# Patient Record
Sex: Female | Born: 1945 | Race: Black or African American | Hispanic: No | Smoking: Former smoker
Health system: Southern US, Community
[De-identification: ages and names within clinical notes are randomized; demographics above are authoritative.]

## PROBLEM LIST (undated history)

## (undated) DIAGNOSIS — F17200 Nicotine dependence, unspecified, uncomplicated: Secondary | ICD-10-CM

## (undated) DIAGNOSIS — R001 Bradycardia, unspecified: Secondary | ICD-10-CM

## (undated) DIAGNOSIS — I2510 Atherosclerotic heart disease of native coronary artery without angina pectoris: Secondary | ICD-10-CM

## (undated) DIAGNOSIS — I6529 Occlusion and stenosis of unspecified carotid artery: Secondary | ICD-10-CM

## (undated) DIAGNOSIS — D649 Anemia, unspecified: Secondary | ICD-10-CM

## (undated) DIAGNOSIS — I4891 Unspecified atrial fibrillation: Secondary | ICD-10-CM

## (undated) DIAGNOSIS — N186 End stage renal disease: Secondary | ICD-10-CM

## (undated) DIAGNOSIS — N2581 Secondary hyperparathyroidism of renal origin: Secondary | ICD-10-CM

## (undated) DIAGNOSIS — I5032 Chronic diastolic (congestive) heart failure: Secondary | ICD-10-CM

## (undated) DIAGNOSIS — I10 Essential (primary) hypertension: Secondary | ICD-10-CM

## (undated) DIAGNOSIS — Z8673 Personal history of transient ischemic attack (TIA), and cerebral infarction without residual deficits: Secondary | ICD-10-CM

## (undated) DIAGNOSIS — I272 Pulmonary hypertension, unspecified: Secondary | ICD-10-CM

## (undated) HISTORY — DX: Pulmonary hypertension, unspecified: I27.20

## (undated) HISTORY — DX: Personal history of transient ischemic attack (TIA), and cerebral infarction without residual deficits: Z86.73

## (undated) HISTORY — DX: Atherosclerotic heart disease of native coronary artery without angina pectoris: I25.10

## (undated) HISTORY — DX: Chronic diastolic (congestive) heart failure: I50.32

## (undated) HISTORY — DX: Essential (primary) hypertension: I10

## (undated) HISTORY — DX: Bradycardia, unspecified: R00.1

## (undated) HISTORY — DX: Unspecified atrial fibrillation: I48.91

## (undated) HISTORY — DX: Occlusion and stenosis of unspecified carotid artery: I65.29

---

## 2003-09-20 ENCOUNTER — Other Ambulatory Visit: Payer: Self-pay

## 2008-03-18 ENCOUNTER — Emergency Department: Payer: Self-pay | Admitting: Unknown Physician Specialty

## 2009-02-02 ENCOUNTER — Inpatient Hospital Stay: Payer: Self-pay | Admitting: Specialist

## 2009-04-24 ENCOUNTER — Other Ambulatory Visit: Payer: Self-pay | Admitting: Nephrology

## 2009-09-02 ENCOUNTER — Inpatient Hospital Stay: Payer: Self-pay | Admitting: Internal Medicine

## 2009-09-30 ENCOUNTER — Ambulatory Visit: Payer: Self-pay | Admitting: Cardiovascular Disease

## 2009-09-30 ENCOUNTER — Inpatient Hospital Stay: Payer: Self-pay | Admitting: Specialist

## 2011-08-22 ENCOUNTER — Inpatient Hospital Stay: Payer: Self-pay | Admitting: Internal Medicine

## 2011-08-22 LAB — CBC
HCT: 19.7 % — ABNORMAL LOW (ref 35.0–47.0)
HGB: 6.3 g/dL — ABNORMAL LOW (ref 12.0–16.0)
MCH: 28.4 pg (ref 26.0–34.0)
MCHC: 31.8 g/dL — ABNORMAL LOW (ref 32.0–36.0)
Platelet: 186 10*3/uL (ref 150–440)
RBC: 2.21 10*6/uL — ABNORMAL LOW (ref 3.80–5.20)
WBC: 5.6 10*3/uL (ref 3.6–11.0)

## 2011-08-22 LAB — TROPONIN I
Troponin-I: 0.11 ng/mL — ABNORMAL HIGH
Troponin-I: 0.11 ng/mL — ABNORMAL HIGH

## 2011-08-22 LAB — COMPREHENSIVE METABOLIC PANEL
Albumin: 2.6 g/dL — ABNORMAL LOW (ref 3.4–5.0)
Anion Gap: 11 (ref 7–16)
Calcium, Total: 8.2 mg/dL — ABNORMAL LOW (ref 8.5–10.1)
Chloride: 115 mmol/L — ABNORMAL HIGH (ref 98–107)
Creatinine: 6.27 mg/dL — ABNORMAL HIGH (ref 0.60–1.30)
Glucose: 90 mg/dL (ref 65–99)
SGPT (ALT): 20 U/L
Sodium: 141 mmol/L (ref 136–145)

## 2011-08-22 LAB — CK TOTAL AND CKMB (NOT AT ARMC)
CK, Total: 98 U/L (ref 21–215)
CK-MB: 5.8 ng/mL — ABNORMAL HIGH (ref 0.5–3.6)
CK-MB: 6.3 ng/mL — ABNORMAL HIGH (ref 0.5–3.6)

## 2011-08-23 LAB — BASIC METABOLIC PANEL
Anion Gap: 14 (ref 7–16)
BUN: 71 mg/dL — ABNORMAL HIGH (ref 7–18)
Co2: 13 mmol/L — ABNORMAL LOW (ref 21–32)
EGFR (African American): 9 — ABNORMAL LOW
EGFR (Non-African Amer.): 7 — ABNORMAL LOW
Glucose: 73 mg/dL (ref 65–99)
Osmolality: 306 (ref 275–301)
Potassium: 4.6 mmol/L (ref 3.5–5.1)

## 2011-08-23 LAB — CBC WITH DIFFERENTIAL/PLATELET
Basophil #: 0 10*3/uL (ref 0.0–0.1)
Basophil %: 0.6 %
Eosinophil %: 2.5 %
HCT: 24.5 % — ABNORMAL LOW (ref 35.0–47.0)
Lymphocyte #: 0.7 10*3/uL — ABNORMAL LOW (ref 1.0–3.6)
MCH: 29 pg (ref 26.0–34.0)
MCHC: 32.5 g/dL (ref 32.0–36.0)
Monocyte #: 0.2 10*3/uL (ref 0.0–0.7)
Neutrophil #: 3.7 10*3/uL (ref 1.4–6.5)
Neutrophil %: 77.4 %
Platelet: 165 10*3/uL (ref 150–440)
RBC: 2.75 10*6/uL — ABNORMAL LOW (ref 3.80–5.20)
WBC: 4.7 10*3/uL (ref 3.6–11.0)

## 2011-08-23 LAB — LIPID PANEL: VLDL Cholesterol, Calc: 11 mg/dL (ref 5–40)

## 2011-08-23 LAB — CK TOTAL AND CKMB (NOT AT ARMC): CK-MB: 5.3 ng/mL — ABNORMAL HIGH (ref 0.5–3.6)

## 2011-08-23 LAB — TROPONIN I: Troponin-I: 0.08 ng/mL — ABNORMAL HIGH

## 2011-08-23 LAB — MAGNESIUM: Magnesium: 1.7 mg/dL — ABNORMAL LOW

## 2011-08-23 LAB — IRON AND TIBC
Iron Bind.Cap.(Total): 285 ug/dL (ref 250–450)
Iron Saturation: 27 %

## 2011-08-23 LAB — PHOSPHORUS: Phosphorus: 6.1 mg/dL — ABNORMAL HIGH (ref 2.5–4.9)

## 2011-08-23 LAB — FERRITIN: Ferritin (ARMC): 14 ng/mL (ref 8–388)

## 2011-08-24 LAB — PROTEIN / CREATININE RATIO, URINE
Creatinine, Urine: 44 mg/dL (ref 30.0–125.0)
Protein, Random Urine: 363 mg/dL — ABNORMAL HIGH (ref 0–12)

## 2011-08-24 LAB — HEMOGLOBIN: HGB: 8.6 g/dL — ABNORMAL LOW (ref 12.0–16.0)

## 2011-08-25 LAB — CBC WITH DIFFERENTIAL/PLATELET
Basophil #: 0 10*3/uL (ref 0.0–0.1)
Eosinophil #: 0.1 10*3/uL (ref 0.0–0.7)
Eosinophil %: 0.7 %
HCT: 27.5 % — ABNORMAL LOW (ref 35.0–47.0)
Lymphocyte %: 8.1 %
MCH: 29.6 pg (ref 26.0–34.0)
MCHC: 32.8 g/dL (ref 32.0–36.0)
MCV: 90 fL (ref 80–100)
Neutrophil #: 6.2 10*3/uL (ref 1.4–6.5)
Neutrophil %: 85.6 %
Platelet: 140 10*3/uL — ABNORMAL LOW (ref 150–440)
RBC: 3.05 10*6/uL — ABNORMAL LOW (ref 3.80–5.20)
RDW: 18.4 % — ABNORMAL HIGH (ref 11.5–14.5)

## 2011-08-25 LAB — BASIC METABOLIC PANEL
Anion Gap: 9 (ref 7–16)
Calcium, Total: 8.6 mg/dL (ref 8.5–10.1)
Chloride: 105 mmol/L (ref 98–107)
Creatinine: 4.16 mg/dL — ABNORMAL HIGH (ref 0.60–1.30)
EGFR (Non-African Amer.): 11 — ABNORMAL LOW
Osmolality: 287 (ref 275–301)

## 2011-08-26 LAB — CBC WITH DIFFERENTIAL/PLATELET
Basophil %: 0.4 %
Eosinophil #: 0.1 10*3/uL (ref 0.0–0.7)
HCT: 28.2 % — ABNORMAL LOW (ref 35.0–47.0)
HGB: 9 g/dL — ABNORMAL LOW (ref 12.0–16.0)
Lymphocyte #: 0.6 10*3/uL — ABNORMAL LOW (ref 1.0–3.6)
Lymphocyte %: 7.6 %
MCH: 28.8 pg (ref 26.0–34.0)
MCHC: 32 g/dL (ref 32.0–36.0)
Monocyte %: 6.5 %
RBC: 3.14 10*6/uL — ABNORMAL LOW (ref 3.80–5.20)

## 2011-08-26 LAB — BASIC METABOLIC PANEL
Anion Gap: 8 (ref 7–16)
Calcium, Total: 8.6 mg/dL (ref 8.5–10.1)
Co2: 29 mmol/L (ref 21–32)
Creatinine: 2.95 mg/dL — ABNORMAL HIGH (ref 0.60–1.30)
EGFR (African American): 19 — ABNORMAL LOW
EGFR (Non-African Amer.): 16 — ABNORMAL LOW
Glucose: 91 mg/dL (ref 65–99)
Osmolality: 276 (ref 275–301)
Potassium: 3.8 mmol/L (ref 3.5–5.1)
Sodium: 138 mmol/L (ref 136–145)

## 2011-09-19 ENCOUNTER — Ambulatory Visit: Payer: Self-pay | Admitting: Vascular Surgery

## 2011-09-19 LAB — CBC
HCT: 37.5 % (ref 35.0–47.0)
HGB: 11.9 g/dL — ABNORMAL LOW (ref 12.0–16.0)
MCH: 30.2 pg (ref 26.0–34.0)
MCHC: 31.7 g/dL — ABNORMAL LOW (ref 32.0–36.0)
Platelet: 193 10*3/uL (ref 150–440)
RBC: 3.94 10*6/uL (ref 3.80–5.20)
RDW: 19.5 % — ABNORMAL HIGH (ref 11.5–14.5)

## 2011-09-19 LAB — BASIC METABOLIC PANEL
Anion Gap: 9 (ref 7–16)
Calcium, Total: 9 mg/dL (ref 8.5–10.1)
Chloride: 107 mmol/L (ref 98–107)
Co2: 22 mmol/L (ref 21–32)
Creatinine: 5.4 mg/dL — ABNORMAL HIGH (ref 0.60–1.30)
EGFR (African American): 9 — ABNORMAL LOW
Osmolality: 283 (ref 275–301)
Potassium: 4.2 mmol/L (ref 3.5–5.1)
Sodium: 138 mmol/L (ref 136–145)

## 2011-09-30 ENCOUNTER — Ambulatory Visit: Payer: Self-pay | Admitting: Vascular Surgery

## 2011-09-30 LAB — POTASSIUM: Potassium: 4.2 mmol/L (ref 3.5–5.1)

## 2011-12-12 ENCOUNTER — Ambulatory Visit: Payer: Self-pay | Admitting: Vascular Surgery

## 2012-02-13 ENCOUNTER — Ambulatory Visit: Payer: Self-pay | Admitting: Vascular Surgery

## 2012-11-15 ENCOUNTER — Emergency Department: Payer: Self-pay | Admitting: Emergency Medicine

## 2012-12-19 ENCOUNTER — Ambulatory Visit: Payer: Self-pay | Admitting: Vascular Surgery

## 2013-08-02 ENCOUNTER — Encounter: Payer: Self-pay | Admitting: Podiatry

## 2013-11-26 ENCOUNTER — Emergency Department: Payer: Self-pay | Admitting: Emergency Medicine

## 2013-11-26 LAB — BASIC METABOLIC PANEL
Anion Gap: 9 (ref 7–16)
BUN: 52 mg/dL — AB (ref 7–18)
Calcium, Total: 8.8 mg/dL (ref 8.5–10.1)
Chloride: 102 mmol/L (ref 98–107)
Co2: 26 mmol/L (ref 21–32)
Creatinine: 9.2 mg/dL — ABNORMAL HIGH (ref 0.60–1.30)
EGFR (Non-African Amer.): 4 — ABNORMAL LOW
GFR CALC AF AMER: 5 — AB
Glucose: 87 mg/dL (ref 65–99)
Osmolality: 287 (ref 275–301)
POTASSIUM: 5.2 mmol/L — AB (ref 3.5–5.1)
Sodium: 137 mmol/L (ref 136–145)

## 2013-11-26 LAB — CBC
HCT: 27.6 % — ABNORMAL LOW (ref 35.0–47.0)
HGB: 8.7 g/dL — ABNORMAL LOW (ref 12.0–16.0)
MCH: 30.2 pg (ref 26.0–34.0)
MCHC: 31.5 g/dL — AB (ref 32.0–36.0)
MCV: 96 fL (ref 80–100)
PLATELETS: 147 10*3/uL — AB (ref 150–440)
RBC: 2.87 10*6/uL — ABNORMAL LOW (ref 3.80–5.20)
RDW: 15.5 % — ABNORMAL HIGH (ref 11.5–14.5)
WBC: 4.8 10*3/uL (ref 3.6–11.0)

## 2013-12-30 ENCOUNTER — Ambulatory Visit: Payer: Self-pay | Admitting: Vascular Surgery

## 2014-08-27 ENCOUNTER — Ambulatory Visit: Payer: Self-pay | Admitting: Podiatry

## 2014-09-02 NOTE — Op Note (Signed)
PATIENT NAME:  Toni Carlson, PANCOAST MR#:  [id number] DATE OF BIRTH:  27-Aug-1945  DATE OF PROCEDURE:  02/13/2012  PREOPERATIVE DIAGNOSES:  1. End-stage renal disease.  2. Poorly functioning left arm AV fistula.  3. Hypertension.   POSTOPERATIVE DIAGNOSES:  1. End-stage renal disease.  2. Poorly functioning left arm AV fistula.  3. Hypertension.   PROCEDURES PERFORMED: 1. Ultrasound guidance for vascular access to left brachiocephalic AV fistula.  2. Left upper extremity AV fistulogram.  3. Percutaneous transluminal angioplasty of stenosis at the cephalic vein/subclavian vein confluence with 7 and 8 mm diameter angioplasty balloons.   SURGEON: Algernon Huxley, M.D.   ANESTHESIA: Local with moderate conscious sedation.   ESTIMATED BLOOD LOSS: Minimal.   CONTRAST USED: 45 mL Visipaque.   INDICATION FOR PROCEDURE: This is a 69 year old African American female with end-stage renal disease. Her fistula has had decrease in flows on dialysis and we are asked to assess this with fistulogram. The risks and benefits were discussed and informed consent was obtained.   DESCRIPTION OF PROCEDURE: The patient was brought to the vascular interventional radiology suite. The left upper extremity was sterilely prepped and draped and a sterile surgical field was created. The fistula was accessed not far beyond the anastomosis with a micropuncture needle. Micropuncture wire and sheath were placed. 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 The fistula around the pseudoaneurysm appeared patent. Further up the arm there was a moderate stenosis at the cephalic vein/subclavian vein confluence and I elected to treat this area. The stenosis appeared to be in the 60% range. I crossed the lesion without difficulty with a Magic torque wire. A 6 French sheath had been placed. 7 mm and then an 8 mm diameter angioplasty balloon were performed. There appeared to be some vasospasm  following this, but the area did appear to have improved flow particularly in the area of the tightest stenosis prior to treatment. At this point, I elected to terminate the procedure. The sheath was removed around a 4-0 Monocryl pursestring suture, pressure was held, and a sterile dressing was placed. The patient tolerated the procedure well and was taken to the recovery room in stable condition. ____________________________ Algernon Huxley, MD jsd:slb D: 02/13/2012 14:27:21 ET T: 02/13/2012 14:47:11 ET JOB#: [id number]  cc: Algernon Huxley, MD, <Dictator> Algernon Huxley MD ELECTRONICALLY SIGNED 02/14/2012 13:59

## 2014-09-05 NOTE — Op Note (Signed)
PATIENT NAME:  Toni Carlson, Toni Carlson MR#:  [id number] DATE OF BIRTH:  09/15/1945  DATE OF PROCEDURE:  12/19/2012  PREOPERATIVE DIAGNOSES:  1. End-stage renal disease.  2. Poorly functioning left arm arteriovenous fistula.  3. Hypertension.  4. Congestive heart failure.  5. Coronary disease.   POSTOPERATIVE DIAGNOSES: 1. End-stage renal disease.  2. Poorly functioning left arm arteriovenous fistula.  3. Hypertension.  4. Congestive heart failure.  5. Coronary disease.   PROCEDURES: 1. Ultrasound guidance for vascular access to left arm arteriovenous fistula.  2. Left upper extremity fistulogram, central venogram.  3. Percutaneous transluminal angioplasty of peri-anastomotic stenosis with a 5 mm diameter angioplasty balloon.  4. Percutaneous transluminal angioplasty of stenosis in the cephalic vein near subclavian vein confluence with 7 mm diameter angioplasty balloon.   SURGEON: Algernon Huxley, MD  ANESTHESIA: Local with moderate conscious sedation.   ESTIMATED BLOOD LOSS: Approximately 25 mL.   CONTRAST USED: 30 mL Visipaque.   FLUOROSCOPY TIME: About 2-1/2 minutes.   INDICATION FOR PROCEDURE: This is a 69 year old African-American female with end-stage renal disease. She has had diminished flows on her AV fistula for several weeks that are worsening. A noninvasive study about 3 months ago showed some areas of narrowing, but at that time, her fistula was working well. There was concern that this would likely progress, and we were asked to do a fistulogram. Risks and benefits were discussed. Informed consent was obtained.   DESCRIPTION OF THE PROCEDURE: The patient was brought to the vascular interventional radiology suite. The left upper extremity was sterilely prepped and draped, and a sterile surgical field was created. The fistula was accessed in the midportion under direct ultrasound guidance without difficulty with a micropuncture needle, and micropuncture wire and sheath were placed. It  was initially in retrograde fashion, and a Kumpe catheter was placed through the arterial anastomosis which showed a 70% to 80% stenosis at the peri-anastomotic region in the swing point of the cephalic vein. Magic Torque wire was placed into the brachial artery, and this lesion was treated with a 5 mm diameter angioplasty balloon. Following angioplasty, there was still some stenosis that appeared to likely be in the 40% to 50% range, but there was improvement and a good thrill in the fistula, so I elected to not over-dilate this area for fear of injuring it at the peri-anastomotic region. I then flipped the sheath under fluoroscopic guidance with a Magic Torque wire, and took this in an antegrade fashion. Stenosis was seen in the cephalic vein about 3 cm before the confluence of the subclavian vein that was high-grade, in the 85% to 90% range. The remainder of the cephalic vein and central venous circulation were patent. This lesion was crossed without difficulty with a Magic Torque wire. It was treated with a 7 mm diameter high-pressure angioplasty balloon inflated to 18 atmospheres. Following angioplasty, this area was markedly improved, with only about 15% to 20% residual stenosis, and I elected to terminate the procedure. The sheath was removed around a 4-0 Monocryl pursestring suture. Pressure was held. Sterile dressing was placed. The patient tolerated the procedure well and was taken to the recovery room in stable condition.   ____________________________ Algernon Huxley, MD jsd:OSi D: 12/19/2012 11:15:23 ET T: 12/19/2012 11:41:04 ET JOB#: [id number]  cc: Algernon Huxley, MD, <Dictator> Algernon Huxley MD ELECTRONICALLY SIGNED 12/20/2012 8:18

## 2014-09-06 NOTE — Op Note (Signed)
PATIENT NAME:  Toni Carlson, Toni Carlson MR#:  [id number] DATE OF BIRTH:  [birth year]  DATE OF PROCEDURE:  12/30/2013  PREOPERATIVE DIAGNOSES: 1.  End-stage renal disease.  2.  Poorly functioning left arm arteriovenous fistula with decreased flow and aneurysms.  3.  Hypertension.   POSTOPERATIVE DIAGNOSES:  1.  End-stage renal disease.  2.  Poorly functioning left arm arteriovenous fistula with decreased flow and aneurysms.  3.  Hypertension.   PROCEDURES PERFORMED: 1.  Ultrasound guidance for vascular access left brachiocephalic arteriovenous fistula.  2.  Left upper extremity fistulogram and central venogram.  3.  Percutaneous transluminal angioplasty of left cephalic vein and subclavian vein confluence with 8 and 10 mm diameter angioplasty balloon.  4.  Percutaneous transluminal angioplasty of left innominate vein with an 8 and 10 mm diameter angioplasty balloon.   SURGEON: Algernon Huxley, M.D.   ANESTHESIA: Local with moderate conscious sedation.   ESTIMATED BLOOD LOSS: 25 mL.   INDICATION FOR PROCEDURE: This is a 69 year old female with end-stage renal disease. Her dialysis access has had diminished function. It also has aneurysmal portions and noninvasive studies have shown significant areas of stenosis in the cephalic vein near the shoulder, and she is brought in for further evaluation. Risks and benefits were discussed. Informed consent was obtained.   DESCRIPTION OF PROCEDURE: The patient was brought to the vascular suite. The left upper extremity was sterilely prepped and draped and a sterile surgical field was created. The fistula was accessed just beyond the anastomosis with a micropuncture needle under direct ultrasound guidance. A micropuncture wire and sheath were then placed and upsized to a 6-French sheath. The patient was given 3000 units of intravenous heparin. Imaging was then performed. This showed aneurysmal areas at the access sites with relative smaller vein between access sites,  but this was not really stenotic and the caliber of the vein at these locations was still in the 8 to 9 mm range. With compression of the fistula, imaging was performed in the perianastomotic and brachial artery. It was smaller in this location, but was still 4 to 5 mm and did not appear flow limiting. The cephalic vein near the subclavian vein confluence had a high-grade vein valve stenosis that was in the 75 to 80% range. There was also slow flow through the left innominate vein with collaterals in the jugular venous system that filled more briskly. This was a little difficult to discern on initial imaging, but later turned out to be significant stenosis of the left innominate vein. A Magic torque wire was used to cross both lesions without difficulty. An 8 mm balloon was initially taken for the cephalic vein and subclavian vein confluence. A tight waste was taken and this was taken to burst pressure and the waste did not resolve. I then advanced into the left innominate vein and used the angioplasty balloon to see if there was actually narrowing in this location and there was. There was a significant waste that was seen in the left innominate vein. The 8 mm balloon was undersized for the left innominate vein and I upsized to a 10 mm diameter angioplasty balloon, inflated to 10 atmospheres with good angiographic completion result and no significant residual stenosis. I inflated the 10 mm diameter angioplasty balloon at the cephalic vein and subclavian vein confluence as well and completion angiogram performed following this had about a 20 to 25% residual stenosis, which was not flow limiting. At this point, I elected to terminate the procedure. The sheath was  removed and a 4-0 Monocryl pursestring suture was placed. Pressure was held. Sterile dressing was placed. The patient tolerated the procedure well and was taken to the recovery room in stable condition.  ____________________________ Algernon Huxley,  MD jsd:sb D: 12/30/2013 14:10:03 ET T: 12/30/2013 16:57:26 ET JOB#: [id number]  cc: Algernon Huxley, MD, <Dictator> Algernon Huxley MD ELECTRONICALLY SIGNED 01/08/2014 10:32

## 2014-09-07 NOTE — H&P (Signed)
PATIENT NAME:  Toni Carlson, Toni Carlson MR#:  [id number] DATE OF BIRTH:  1945/12/21  DATE OF ADMISSION:  08/22/2011  REFERRING PHYSICIAN: ER physician, Dr. Jasmine December   PRIMARY CARE PHYSICIAN: None. The patient used to follow-up at [facility]: Leg swelling.   HISTORY OF PRESENT ILLNESS: The patient is a 69 year old African American female with past medical history of hypertension, chronic kidney disease stage IV in the past, anemia secondary to chronic kidney disease and iron deficiency, as well as diastolic dysfunction who was last admitted to [facility] in 09/2009 for anemia secondary to chronic kidney disease complicated by iron deficiency anemia. For the past one year the patient has not followed up with any physician. She has stopped taking all her medications. She was seen by Dr. Holley Raring in the past but has not followed up with any nephrologist either. She reports sudden onset of bilateral leg swelling in the last two weeks. Denies any shortness of breath or chest pain. She reports fatigue on prolonged standing. Denies any oliguria, nausea, vomiting, diarrhea, constipation, headache, or change in vision. She was found to have acute on chronic kidney failure, hypertensive emergency, evidence of diastolic congestive heart failure, acidosis, and severe anemia.   PAST MEDICAL HISTORY:  1. Hypertension. 2. 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  3. 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 The patient had an endoscopy and colonoscopy in 2010. The endoscopy showed gastritis, esophagitis, and normal duodenum. The colonoscopy showed diverticulosis. 4. Diastolic dysfunction. The patient's last echo in [id number] showed diastolic dysfunction with moderate LVH and normal systolic function.   PAST SURGICAL HISTORY: None.  ALLERGIES: None.    MEDICATIONS: The patient is not taking any medications.   SOCIAL HISTORY: The  patient smokes 1 pack per day for the past five years. Denies any alcohol or drug abuse. She lives with her daughter. 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   FAMILY HISTORY: Sister had lupus. Mother with Alzheimer's dementia. Father died at the age of 84 of MI.   REVIEW OF SYSTEMS: CONSTITUTIONAL: Patient reports fatigue. Denies any fever. EYES: Denies any blurred or double vision. ENT: Denies any tinnitus or ear pain. RESPIRATORY: Denies any cough or wheezing. CARDIOVASCULAR: Denies any chest pain or palpitations. GI: Denies any nausea or vomiting. GU: Denies any dysuria, hematuria, or oliguria. ENDOCRINE: Denies any polyuria, nocturia, or thyroid problems. HEME/LYMPH: Has anemia. Denies any easy bruisability. INTEGUMENTARY: Denies any acne or rash. MUSCULOSKELETAL: Denies any swelling or gout. NEUROLOGIC: Denies any numbness or weakness. PSYCH: Denies any anxiety or depression.   PHYSICAL EXAMINATION:   VITAL SIGNS: Temperature 98.6, heart rate 62, respiratory rate 18, blood pressure 235/95, pulse oximetry 100%.   GENERAL: The patient is a 69 year old African American female sitting in bed.   HEAD: Atraumatic, normocephalic.   EYES: There is severe pallor. No icterus or cyanosis. Pupils equal, round, and reactive to light and accommodation. Extraocular movements intact.   ENT: Wet mucous membranes. No oropharyngeal erythema or thrush.   NECK: Supple. No masses. No JVD. No thyromegaly.  CHEST WALL: No tenderness to palpation. Not using accessory muscles of respiration or intercostal retraction.   LUNGS: The patient has bilateral basal crepitations. No wheezing or rhonchi.   CARDIOVASCULAR: S1, S2 regular. There is a systolic murmur. No rubs or gallops.   ABDOMEN: Soft, nontender, nondistended. No guarding or rigidity. No organomegaly.   SKIN: No rashes or  lesions.   PERIPHERIES: There is 2+ pitting edema all the way up to the knees. The patient also has calf tenderness. 1+ pedal pulses.    MUSCULOSKELETAL: No cyanosis or clubbing.   NEUROLOGIC: Awake, alert, oriented x3. Nonfocal neurological exam. Cranial nerves grossly intact.    PSYCH: Normal mood and affect.   LABORATORY, DIAGNOSTIC, AND RADIOLOGICAL DATA: Chest x-ray shows CHF superimposed on COPD.   Glucose 90, BUN 72, creatinine 6.27, sodium 141, potassium 4.7, chloride 115, CO2 15, calcium 8.2, alkaline phosphatase 222, white count 5.6, hemoglobin 6.3, hematocrit 19.7, platelet 196. Troponin 0.11.   ASSESSMENT AND PLAN: This is a 69 year old female who has been noncompliant with medications and follow-up with history of hypertension, anemia, chronic kidney disease, and diastolic dysfunction who presents with bilateral leg swelling.  1. Hypertensive emergency. The patient's blood pressure was 235/95 on admission. In the past she was on hydralazine, Imdur, and Norvasc. She was on clonidine but that was discontinued due to bradycardia. Will restart her on hydralazine, Imdur, and Norvasc and place on nitro paste. Will also place on p.r.n. clonidine and hydralazine to help better control her blood pressure. 2. Acute on chronic disease. In 2011 the patient's creatinine was 4.17 to 3.86. There has been progression of the renal failure in addition to acidosis. Her electrolytes are normal at present. Discussed with nephrologist, Dr. Candiss Norse. He will evaluate the patient. He recommends giving the patient high dose Lasix 100 mg x1 dose now and then continue high dose Lasix at 80 mg b.i.d.   3. Diastolic CHF exacerbation. The patient has lower extremity swelling, evidence of rales on chest exam, elevated BNP. Will give her Lasix 100 mg x1 dose now and then continue high dose Lasix. This will help her with her leg swelling as well as better control of her blood pressure. 4. Elevated alkaline phosphatase, possibly related to her acute problems including acute renal failure.  5. Anemia. The patient has severe anemia and pallor. In the past,  her hemoglobin has been as low as 5.2. She had extensive work-up during her last hospitalization in 2011 and 2010. Iron studies at that time were consistent with anemia of chronic disease. She also had endoscopy and colonoscopy which showed no evidence of blood loss. Will transfuse her 2 units of blood. Recheck a CBC in the a.m. Will repeat iron studies. Will check stool guaiacs. The patient may require initiation of oral iron supplementation and Procrit in view of her chronic kidney disease.  6. Elevated troponin. The patient denies any chest pain. The troponins could be elevated due to her accelerated hypertension, CHF, and chronic kidney disease. She has been evaluated by cardiologist in the past and will recommend conservative management. Her last echo had shown diastolic dysfunction. Will repeat an echo. Check serial cardiac enzymes. Cannot give her any anticoagulants in light of her severe anemia. Would also hold off any rate lowering agents in view of bradycardia.  7. Smoking. The patient has been counseled about cessation for more than three minutes. Will provide with a nicotine patch in the hospital.  Discussed with the ED physician, discussed with Dr. Candiss Norse, reviewed all medical records, discussed with the patient the plan of care and management.   CRITICAL CARE TIME SPENT: 45 minutes.   ____________________________ Cherre Huger, MD sp:drc D: 08/22/2011 15:45:55 ET T: 08/22/2011 16:23:27 ET JOB#: [id number]  cc: Cherre Huger, MD, <Dictator> Cherre Huger MD ELECTRONICALLY SIGNED 08/23/2011 13:14

## 2014-09-07 NOTE — Op Note (Signed)
PATIENT NAME:  Toni Carlson, CORNER MR#:  [id number] DATE OF BIRTH:  10-19-1945  DATE OF PROCEDURE:  09/30/2011  PREOPERATIVE DIAGNOSIS: End-stage renal disease requiring hemodialysis.   POSTOPERATIVE DIAGNOSIS: End-stage renal disease requiring hemodialysis.   PROCEDURE PERFORMED: Creation of a left arm brachiocephalic fistula.   SURGEON: Katha Cabal, MD    ANESTHESIA: General by LMA.   FLUIDS: Per anesthesia record.   ESTIMATED BLOOD LOSS: Minimal.   SPECIMEN: None.   INDICATIONS: [name] is a 69 year old woman who has been initiated on hemodialysis and is currently maintained with a right IJ tunneled catheter. She has undergone work-up and vein mapping which suggested that a vein was not available for fistula creation; and, therefore, she was prepared for a brachial axillary dialysis graft. Upon induction of general anesthesia with vasodilatation that ensues, the patient appears to have a rather nice basilic and cephalic vein noted in the antecubital fossa. The risks and benefits for a possible fistula creation have been reviewed with the family. It is their  stated desire to have a fistula, if possible; and, therefore, I will explore the vein prior to committing to a graft.   DESCRIPTION OF PROCEDURE: The patient is taken to the Operating Room and placed in the supine position. After adequate general anesthesia is induced, appropriate invasive monitors are placed. Her left arm is extended and prepped and draped in sterile fashion. The cephalic vein is visible in the antecubital fossa, and a curvilinear incision is created; and the dissection is carried down to expose the cephalic vein which appears to be a rather nice vein measuring at least 3.5 to 4 mm. It is dissected circumferentially and marked with a marker. The brachial artery is then exposed medially and looped proximally and distally with Silastic vessel loops.   The cephalic vein is then ligated approximately 1 cm below the  antecubital crease at a place where it bifurcates. Ties of 3-0 silk are used to ligate the vein. The vein is then transected with Potts scissors and spatulated through both branches. It is easily dilated with a Donna Christen coronary dilator which is 3.5 mm; and, in fact, with gentle pressure irrigating with heparinized saline the vein clearly is larger than 4 mm throughout its visualized portion.   Arteriotomy is then made. Stay sutures of 6-0 Prolene are placed, and the cephalic vein is anastomosed to the brachial artery in an end vein-to-side artery fashion using running 6-0 Prolene. Flushing maneuvers are performed, and flow is established through the fistula and then distally. Palpation of the cephalic vein along the arm demonstrates a soft but continuous thrill all the way to the level of the deltoid insertion, and the vein is actually dilated quite nicely and is visible already. There is absolutely no pulsatility to the fistula. The patient maintains a radial pulse.   The wound is then irrigated, inspected, and hemostasis is achieved with Bovie cautery. Subsequently, it is closed in layers using interrupted 3-0 Vicryl, followed by 4-0 Monocryl subcuticular for skin and then Dermabond. The patient tolerated the procedure well. There were no immediate complications. Sponge and needle counts were correct. She was taken to the recovery area in excellent condition.   ____________________________ Katha Cabal, MD ggs:cbb D: 09/30/2011 16:20:21 ET T: 09/30/2011 17:53:23 ET JOB#: [id number]  cc: Katha Cabal, MD, <Dictator> Katha Cabal MD ELECTRONICALLY SIGNED 10/03/2011 17:41

## 2014-09-07 NOTE — Consult Note (Signed)
PATIENT NAME:  Toni Carlson, Toni Carlson MR#:  [id number] DATE OF BIRTH:  1946/03/27  DATE OF CONSULTATION:  08/23/2011  REFERRING PHYSICIAN:   CONSULTING PHYSICIAN:  Dionisio David, MD  INDICATION FOR CONSULTATION: Bradycardia and chest pain.   HISTORY OF PRESENT ILLNESS: This is a 69 year old pleasant African American female who is followed at [facility], came in with uncontrolled hypertension, swelling of the legs, congestion, shortness of breath yesterday. She has been admitted in the past with severe anemia secondary to chronic kidney disease. She had significant renal failure and has been seeing Dr. Holley Raring, the nephrologist, and now Dr. Candiss Norse and she is on the verge of having dialysis. Consideration for dialysis was already being made because of stage IV kidney disease. This morning she developed severe 10/10 chest pain associated with shortness of breath, diaphoresis, hypotension, as well as bradycardia. She dropped her heart rate in the 30's. Right now her heart rate is 48 to 52, blood pressure 105/70, respirations 18. She still has chest pain but is 4/10; initially it was 10/10.   PAST MEDICAL HISTORY:  1. History of hypertension.  2. History of chronic kidney disease, stage IV. 3. Anemia. 4. Diastolic dysfunction.   ALLERGIES: None.   SOCIAL HISTORY: She denies EtOH abuse but smoking. She smokes a half pack per day.   FAMILY HISTORY: Mother has dementia and Alzheimer's. Father died of coronary artery disease at age 75, had MI.   PHYSICAL EXAMINATION:   VITAL SIGNS: She is afebrile. Pulse 42 to 52, respirations 18, blood pressure 105/50. Initially when she first came her blood pressure was 235/95. Pulse oximetry was 100%.   HEENT: Positive JVD.   LUNGS: Few crackles at the bases.   HEART: Regular rate and rhythm. Normal S1, S2. No audible murmur.   ABDOMEN: Soft, nontender. Positive bowel sounds.   EXTREMITIES: 1+ pedal edema.   DIAGNOSTIC DATA: EKG shows sinus bradycardia, 56  beats per minute, LVH, nonspecific ST-T changes. Her BNP 50,043. BUN 71, creatinine 6.07. Troponin is 0.11; third set is 0.08. Hemoglobin is 8; initially it was 6.3 after 2 units of transfusion.   ASSESSMENT AND PLAN:  1. Severe anemia.  2. Chest pain. 3. Stage IV kidney disease.   Discussed with Dr. Candiss Norse. They are going to do dialysis anyway, thus the patient will have a cardiac catheterization and temporary pacemaker wire implanted.  ____________________________ Dionisio David, MD sak:drc D: 08/23/2011 10:40:46 ET T: 08/23/2011 10:57:18 ET JOB#: [id number]  cc: Dionisio David, MD, <Dictator> Dionisio David MD ELECTRONICALLY SIGNED 09/01/2011 9:03

## 2014-09-07 NOTE — Discharge Summary (Signed)
PATIENT NAME:  Toni, Carlson MR#:  [id number] DATE OF BIRTH:  Dec 16, 1945  DATE OF ADMISSION:  08/22/2011 DATE OF DISCHARGE:  08/26/2011  PRIMARY CARE PHYSICIAN: [facility].   NEPHROLOGIST: Murlean Iba, MD  CARDIOLOGIST: Neoma Laming, MD  DISCHARGE DIAGNOSES:  1. Unstable angina, now resolved, status post cardiac catheterization showing moderate left circumflex and mid RCA disease. Chest pain thought to be secondary to fluid overload due to renal failure and hypertensive heart disease, per cardiology.  2. Sinus bradycardia. Could be due to renal failure associated with metabolic issues.  3. Hypertensive emergency, on hydralazine, Imdur, and losartan. Cannot use beta blocker due to bradycardia. Blood pressure under much better control.  4. Chronic kidney disease stage V requiring hemodialysis. Schedule is Monday, Wednesday, and Friday as an outpatient.  5. Acute on chronic diastolic heart failure, on Lasix and ARB. Cannot use beta blocker due to bradycardia.  6. Elevated alkaline phosphatase, likely due to liver condition. 7. Anemia of chronic disease, likely due to chronic kidney disease status post 3 packed red blood cell transfusions and stable hemodynamics now. She may require erythropoietin as an outpatient with dialysis. She was given intravenous iron while in the hospital.  8. Elevated troponin, likely due to supply/demand ischemia.   SECONDARY DIAGNOSES:  1. Hypertension.  2. Chronic kidney disease, stage IV.  3. Anemia of chronic disease.  4. Diastolic dysfunction.   CONSULTANTS: 1. Neoma Laming, MD - Cardiology. 2. Murlean Iba, MD - Nephrology. 3. Hortencia Pilar, MD - Vascular Surgery.  PROCEDURES/RADIOLOGY: Insertion of right IJ cuffed tunneled dialysis catheter with ultrasound guidance on 08/23/2011 by Dr. Delana Meyer.  Cardiac catheterization with temporary pacemaker placement without complication on 0000000 by Dr. Neoma Laming. Initially pacer was pacing at 70 per  minute. Cardiac catheterization revealed moderate left circumflex and mid RCA disease.   Readjustment of pacer lead and rate on 08/24/2011 by Dr. Humphrey Rolls with pacer rate of 80 per minute.   Chest x-ray on 08/22/2011 showed findings consistent with congestive heart failure superimposed upon chronic obstructive pulmonary disease. No pneumonia. No pleural effusion.   Bilateral lower extremity Doppler's on 08/22/2011 showed no deep vein thrombosis on either side.   2-D echocardiogram on 08/23/2011 showed normal chamber size and function with moderate left ventricular hypertrophy. Mild mitral regurgitation. Mild to moderate tricuspid regurgitation with moderate pulmonary hypertension. Ejection fraction of more than 55%.   MAJOR LABORATORY PANEL: HBsAg was negative on 08/23/2011. Intact PTH was elevated with a value of 1435. Serum vitamin D was low with a value of 10.1. Serum transferrin was within normal limits with a value of 230. HBsAg repeated on 08/26/2011 was negative.   HISTORY AND SHORT HOSPITAL COURSE: The patient is a 69 year old female with the above-mentioned medical problems who was admitted for worsening leg swelling. She was found to have acute on chronic kidney disease along with hypertensive emergency with initial blood pressure of 235/95 and possible diastolic heart failure. She was also found to be severely anemic with a hemoglobin of 6.3 and was transfused 2 units of packed red blood cells. Nephrology consult was obtained with Dr. Murlean Iba considering worsening chronic kidney disease. Please see dictated history and physical by Dr. Cherre Huger for further details. The patient was also evaluated by cardiology, Dr. Neoma Laming, as the patient started having severe bradycardia with heart rate dropping in the 30s and 40s along with active chest pain thought to be anginal in nature. Dr. Neoma Laming recommended cardiac catheterization, which was performed  on 08/23/2011 along with  temporary pacemaker, due to severe bradycardia. Based on cardiac catheterization evaluation, her chest pain was thought not to be from cardiac etiology and was thought to be due to worsening kidney disease and possible fluid overload. After discussion with nephrology, vascular surgery was consulted for placement of tunneled dialysis catheter, which was performed by Dr. Hortencia Pilar on 08/23/2011 and immediate dialysis was started on 08/24/2011. The patient tolerated the dialysis well. She was doing much better. On 08/24/2011 her pacemaker rate was adjusted to 80 per minute from initial 70 per minute. As her dialysis continued, her heart rate was slowly improving. Temporary pacemaker was turned off and her heart rate remained stable. She did not require a permanent pacemaker. She did require Intensive Care Unit stay for at least 48 to 72 hours while she had a temporary pacemaker for close monitoring and required dialysis while in the Critical Care Unit. On 08/26/2011, she was doing much better. She did not have any further bradycardia. After discussion with cardiology and nephrology, the patient was discharged home in stable condition.   PERTINENT PHYSICAL EXAMINATION:  VITAL SIGNS: On the date of discharge her temperature was 99.2, heart rate 68 per minute, respirations 18 per minute, blood pressure 167/78 mmHg, and oxygen saturation was 97% on room air.   CARDIOVASCULAR: S1 and S2 normal. No murmurs, rubs, or gallops.   LUNGS: Clear to auscultation bilaterally. No wheezing, rales, rhonchi, or crepitation.   ABDOMEN: Soft, benign.   NEUROLOGIC: Nonfocal examination. All other physical examination remained at baseline.   The patient did require one more unit of packed red blood cells while in the hospital due to low hemoglobin. She did not have any acute bleed or blood loss while in the hospital. Her blood pressure was slowly getting better.   DISCHARGE MEDICATIONS:  1. Imdur 30 mg p.o. daily.   2. Hydralazine 25 mg p.o. four times daily. 3. Losartan 100 mg p.o. daily.   DISCHARGE DIET: Renal diet.   DISCHARGE ACTIVITY: As tolerated.   DISCHARGE INSTRUCTIONS AND FOLLOW-UP: The patient was instructed to followup with her primary care physician at the [facility] in 1 to 2 weeks. She will need follow-up with Dr. Neoma Laming from cardiology on 09/01/2011, this coming Thursday, at 10:00 a.m. She will need follow-up with Dr. Candiss Norse from nephrology this coming Monday, on 08/29/2011, as scheduled.   TOTAL TIME DISCHARGING THIS PATIENT: 55 minutes.  ____________________________ Lucina Mellow. Manuella Ghazi, MD vss:slb D: 08/30/2011 10:25:43 ET T: 08/30/2011 16:31:53 ET JOB#: [id number]  cc: Rubel Heckard S. Manuella Ghazi, MD, <Dictator> [facility] Murlean Iba, MD Dionisio David, MD Katha Cabal, MD Lucina Mellow [facility] MD ELECTRONICALLY SIGNED 08/31/2011 9:14

## 2014-09-07 NOTE — Op Note (Signed)
PATIENT NAME:  Toni Carlson, Toni Carlson MR#:  [id number] DATE OF BIRTH:  [birth year]  DATE OF PROCEDURE:  08/23/2011  PREOPERATIVE DIAGNOSIS: Acute on chronic renal insufficiency requiring hemodialysis.   POSTOPERATIVE DIAGNOSIS: Acute on chronic renal insufficiency requiring hemodialysis.   PROCEDURE PERFORMED: Insertion of right IJ cuffed tunneled dialysis catheter with ultrasound guidance.   PROCEDURE PERFORMED BY: Katha Cabal, MD   SEDATION: Versed 2 mg plus fentanyl 100 mcg administered IV. Continuous ECG, pulse oximetry, and cardiopulmonary monitoring is performed throughout the entire procedure by the interventional radiology nurse. Total sedation time was 45 minutes.   ACCESS: Right IJ.   CONTRAST USED: None.   FLUORO TIME: Approximately three minutes.   INDICATIONS: Toni Carlson is a 69 year old woman with multiple medical problems and acute coronary syndrome who presents with acute on chronic renal insufficiency and will require hemodialysis. The risks and benefits were reviewed and the patient has agreed as has the family to proceed with placement of a PermCath. Risks and benefits were reviewed. All are in agreement with proceeding with this life-saving therapy.   PROCEDURE: The patient is brought to Special Procedures and placed in the supine position. After adequate sedation is achieved, right neck is prepped and draped in a sterile fashion. 1% lidocaine is infiltrated in the soft tissues. Ultrasound is placed in a sterile sleeve. Ultrasound is utilized secondary to lack of appropriate landmarks and to avoid vascular injury. Under direct ultrasound visualization, jugular vein is identified. It is echolucent, homogeneous, easily compressible. This indicates patency. Image is recorded for the permanent record. Under direct real-time visualization, a Seldinger needle is inserted into the jugular vein. J-wire is then advanced without difficulty under fluoroscopic guidance. A counterincision is  made with an 11 blade scalpel. Pocket is created with a hemostat and the dilators are passed over the wire. Peel-away sheath and dilator are advanced over the wire and the dilator and wire are removed. A 19 cm tip-to-cuff catheter is then advanced through the peel-away sheath and the peel-away sheath is removed. Under fluoroscopy the tips are positioned at the appropriate level of the atriocaval junction and the catheter is approximated to the chest wall. A small incision is created after 1% lidocaine is infiltrated. Significant venous ooze is encountered and a separate incision is made approximately 1 cm away. Tunneling device is then passed subcutaneously and the catheter is pulled subcutaneously. Hub assembly is connected. Both lumens aspirate and flush easily under fluoroscopy. It has a smooth contour with the tips at the atriocaval junction.   The catheter is then secured to the skin of the chest wall with an 0 silk x2. The neck counterincision is closed with a 4-0 Monocryl subcuticular and the small initial incision in the skin is closed with a 4-0 Monocryl subcuticular as well. Light pressure is applied and then Dermabond. Sterile dressing is applied at the exit site. The patient tolerated the procedure well. There were no immediate complications.   ____________________________ Katha Cabal, MD ggs:drc D: 08/23/2011 16:38:47 ET T: 08/23/2011 16:54:53 ET JOB#: [id number]  cc: Katha Cabal, MD, <Dictator> Murlean Iba, MD Dr. Arlice Colt MD ELECTRONICALLY SIGNED 08/24/2011 14:09

## 2014-09-07 NOTE — Consult Note (Signed)
General Aspect emergent consult for dialysis access    Present Illness The patient is a 69 year old African American female with past medical history of hypertension, chronic kidney disease stage IV in the past, anemia secondary to chronic kidney disease and iron deficiency, as well as diastolic dysfunction who was last admitted to [facility] in 09/2009 for anemia secondary to chronic kidney disease complicated by iron deficiency anemia. She has not followed up with any MD for a year.  She reports sudden onset of bilateral leg swelling in the last two weeks. Denies any shortness of breath or chest pain. She reports fatigue on prolonged standing. Denies any oliguria, nausea, vomiting, diarrhea, constipation, headache, or change in vision. She was found to have acute on chronic kidney failure, hypertensive emergency, evidence of diastolic congestive heart failure, acidosis, and severe anemia.   PAST MEDICAL HISTORY:  1. Hypertension. 2. Chronic kidney disease, stage V 3. 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  4. Diastolic dysfunction with moderate LVH and normal systolic function.    No Known Allergies:   Case History:   Family History Non-Contributory   Review of Systems:   ROS Pt not able to provide ROS  short of breath and lethargic    Medications/Allergies Reviewed Medications/Allergies reviewed   Physical Exam:   GEN well developed, critically ill appearing    HEENT pale conjunctivae, hearing intact to voice, dry oral mucosa    NECK supple  trachea midline    RESP postive use of accessory muscles  rhonchi    CARD regular rate  LE edema present  positive JVD    ABD denies tenderness  soft    GU foley catheter in place  clear yellow urine draining    EXTR negative cyanosis/clubbing, positive edema    SKIN No rashes, No ulcers, tight to palpation    NEURO cranial nerves intact, motor/sensory function intact    PSYCH sedated   Nursing/Ancillary  Notes: **Vital Signs.:   09-Apr-13 07:46   Vital Signs Type Routine   Temperature Temperature (F) 97.5   Celsius 36.3   Temperature Source oral   Pulse Pulse 50   Pulse source per Dinamap   Systolic BP Systolic BP 627   Diastolic BP (mmHg) Diastolic BP (mmHg) 78   Mean BP 106   BP Source Dinamap   Pulse Ox % Pulse Ox % 100   Oxygen Delivery Room Air/ 21 %   Cardiology:  08-Apr-13 12:22    Ventricular Rate 56   Atrial Rate 56   P-R Interval 224   QRS Duration 98   QT 464   QTc 447   P Axis 79   R Axis -19   T Axis 134  Routine Chem:  08-Apr-13 12:28    B-Type Natriuretic Peptide [facility]) 50043  Cardiac:  08-Apr-13 12:28    Troponin I 0.11  Routine Chem:  08-Apr-13 12:29    Glucose, Serum 90   BUN 72   Creatinine (comp) 6.27   Sodium, Serum 141   Potassium, Serum 4.7   Chloride, Serum 115   CO2, Serum 15   Calcium (Total), Serum 8.2  Hepatic:  08-Apr-13 12:29    Bilirubin, Total 0.2   Alkaline Phosphatase 222   SGPT (ALT) 20   SGOT (AST) 20   Total Protein, Serum 5.6   Albumin, Serum 2.6  Routine Chem:  08-Apr-13 12:29    Osmolality (calc) 302   eGFR (African American) 9   eGFR (Non-African American) 7  Anion Gap 11  Routine Hem:  08-Apr-13 12:29    WBC (CBC) 5.6   RBC (CBC) 2.21   Hemoglobin (CBC) 6.3   Hematocrit (CBC) 19.7   Platelet Count (CBC) 186   MCV 89   MCH 28.4   MCHC 31.8   RDW 20.9  Cardiac:  08-Apr-13 16:50    CK, Total 98   CPK-MB, Serum 5.8  Routine BB:  08-Apr-13 16:50    Antibody Screen NEGATIVE  Cardiac:  08-Apr-13 20:47    Troponin I 0.11   CK, Total 89   CPK-MB, Serum 6.3  09-Apr-13 06:41    Troponin I 0.08  Routine Chem:  09-Apr-13 06:41    Glucose, Serum 73   BUN 71   Creatinine (comp) 6.04   Sodium, Serum 144   Potassium, Serum 4.6   Chloride, Serum 117   CO2, Serum 13   Calcium (Total), Serum 8.2   Osmolality (calc) 306   eGFR (African American) 9   eGFR (Non-African American) 7   Anion Gap 14   Routine Hem:  09-Apr-13 06:41    WBC (CBC) 4.7   RBC (CBC) 2.75   Hemoglobin (CBC) 8.0   Hematocrit (CBC) 24.5   Platelet Count (CBC) 165   MCV 89   MCH 29.0   MCHC 32.5   RDW 19.0  Cardiac:  09-Apr-13 06:41    CK, Total 79   CPK-MB, Serum 5.3  Routine Hem:  09-Apr-13 06:41    Neutrophil % 77.4   Lymphocyte % 15.8   Monocyte % 3.7   Eosinophil % 2.5   Basophil % 0.6   Neutrophil # 3.7   Lymphocyte # 0.7   Monocyte # 0.2   Eosinophil # 0.1   Basophil # 0.0  Routine Chem:  09-Apr-13 06:41    Magnesium, Serum 1.7   Cholesterol, Serum 176   Triglycerides, Serum 53   HDL (INHOUSE) 65   VLDL Cholesterol Calculated 11   LDL Cholesterol Calculated 100   Iron Binding Capacity (TIBC) 285   Unbound Iron Binding Capacity 209   Iron, Serum 76   Iron Saturation 27   Ferritin ([facility]) 14   Phosphorus, Serum 6.1  Blood Glucose:  09-Apr-13 17:25    POCT Blood Glucose 113   Radiology Results: [place]:    08-Apr-13 16:30, [place] Color Flow Doppler Low Extrem Bilat   [place] Color Flow Doppler Low Extrem Bilat    REASON FOR EXAM:    leg swelling, pain  COMMENTS:       PROCEDURE: [place]  - US DOPPLER LOW EXTR BILATERAL  - Aug 22 2011  4:30PM     RESULT: The phasic, augmentation and Valsalva flow waveforms bilaterally   are normal in appearance. The femoral and popliteal vein show complete   compressibility throughout their course bilaterally. Doppler examination   shows no occlusion or evidence for deep vein thrombosis.    IMPRESSION:   1. No deep vein thrombosis is identified on either side.      Thank you for the opportunity to contribute to the care of your patient.     Verified By: Dionne Ano WALL, M.D., MD  Cardiac Catherization:    09-Apr-13 12:17, Cardiac Catheterization   Cardiac Catheterization    [facility]  [place] [place], [place] [address]  [phone]     Cardiovascular Catheterization Comprehensive Report     Patient: Toni Carlson  Study date:  08/23/2011  MR number: [id number]  Account  number: [id number]     DOB: 1945/06/28  Age: 68 years  Gender: Female  Race: Black  Height: 68.1 in  Weight: 176.7 lb     Diagnostic Cardiologist:  Neoma Laming, MD     SUMMARY:     -Summary: Moderate disease in LCX, and Mid RCA around 50-60 percent,  no high grade lesion to explain chestpains. LV gram was defferred.  Also temporary pace maker was placed via right groin and pacing at 70  /min.     CORONARY CIRCULATION: The coronary circulation is right dominant. Mid  LAD: There was a 40 % stenosis. Mid circumflex: There was a 60 %  stenosis. Mid RCA: There was a 50 % stenosis.     INDICATIONS: Angina/MI: unstable angina.     HISTORY: No history of previous myocardial infarction. The patient has  hypertension and dialysis-treated renal failure. There was no history  of congestive heart failure, cardiogenic shock, cerebrovascular  disease, peripheral arterial disease, chronic lung disease, diabetes,  or cardiac arrest. There was no family history of coronary artery  disease. PRIOR CARDIOVASCULAR PROCEDURES: No history of valve  surgery, coronary or graft percutaneous intervention, or coronary  bypass surgery.     PRIOR DIAGNOSTIC TEST RESULTS: No prior stress test is available.     PROCEDURES PERFORMED: Temporary pacemaker placement. Left heart  catheterization with ventriculography. Left coronary angiography.  Right coronary angiography. Procedure: Successful Closure with Mynx     PROCEDURE: The risks and alternatives of the procedures and conscious  sedation were explained to the patient and informed consent was  obtained. The patient was brought to the cath lab and placed on the  table. The planned puncture sites were prepped and draped in the  usual sterile fashion.     -Right femoral artery access. The puncture site was infiltrated with  20 ml 1 % lidocaine. The vessel was accessed using the modified  Seldinger technique, a  wire was threaded into the vessel, and a 5 Fr  x 11 cm Avanti sheath was advanced over the wire into the vessel.     -Temporary pacemaker. A pacing catheter was placed and advanced under  fluoroscopic guidance to the right ventricular apex and attached to  the pacemaker. The pacing threshold was verified and the amplitude  adjusted accordingly.     -Left heart catheterization. A 5 Fr Angled pigtail catheter was  advanced to the ascending aorta. After recording ascending aortic  pressure, the catheter was advanced across the aortic valve and left  ventricular pressure was recorded. Ventriculography was performed  using power injection of contrast agent ( 30 ml) at 10 cc/sec at 600  PSI with a 0.4 sec rise time. Imaging was performed using an RAO  projection.     -Left coronary artery angiography. A catheter was advanced to the  aorta and positioned in the vessel ostium under fluoroscopic  guidance. Angiography was performed inmultiple projections using  hand-injection of contrast.     -Right coronary artery angiography. A catheter was advanced to the  aorta and positioned in the vessel ostium under fluoroscopic  guidance. Angiography was performed in multiple projections using  hand-injection of contrast.     -Successful Closure with Mynx.     PROCEDURE COMPLETION: There were no complications. TIMING: Test  started at 12:35. Test concluded at 13:20. RADIATION EXPOSURE:  Fluoroscopy time: 8.25 min.  MEDICATIONS GIVEN: Midazolam, 1 mg, IV, at 12:21. Fentanyl, 25 mcg,  IV, at 12:33. Midazolam, 1 mg, IV, at 12:36.  Fentanyl, 25 mcg, IV, at  12:36.  CONTRAST GIVEN: Isovue 150 ml.     Prepared and signed by     Neoma Laming, MD  Signed 08/23/2011 13:28:54     STUDY DIAGRAM     Angiographic findings  Native coronary lesions:   Mid LAD: Lesion 1: 40 % stenosis.   Mid circumflex: Lesion 1: 60 % stenosis.   Mid RCA: Lesion 1: 50 % stenosis.     HEMODYNAMIC TABLES     Outputs:   Baseline  Outputs:  -- CALCULATIONS[age]  Outputs:  -- CALCULATIONS: Body Surface Area: 1.94  Outputs:  -- CALCULATIONS: Height in cm: 173.00  Outputs:  -- CALCULATIONS: Sex: Female  Outputs:  -- CALCULATIONS: Weight in kg: 80.30     Impression 1.  Acute on Chronic renal insufficiency           will now need dialysis likely permanently          will need catheter immediately but appears to be able to tolerate insertion of permacath so that a temp cath can be avoided  2.  CHF          will need diuresis inconjunction with dialysis 3.  Bradyarrhythmia         temp pacemaker in place         cardiology following 4.  Coronary artery disease          medical management 5.  Hypetension          adjust meds as needed per Cardiology and Nephrology    Plan level 4   Electronic Signatures: Hortencia Pilar (MD)  (Signed 10-Apr-13 10:40)  Authored: General Aspect/Present Illness, Allergies, History and Physical Exam, Vital Signs, Labs, Radiology, Impression/Plan   Last Updated: 10-Apr-13 10:40 by Hortencia Pilar (MD)

## 2014-09-10 ENCOUNTER — Encounter: Payer: Self-pay | Admitting: Podiatry

## 2014-09-15 ENCOUNTER — Encounter: Payer: Self-pay | Admitting: Podiatry

## 2014-09-15 ENCOUNTER — Ambulatory Visit (INDEPENDENT_AMBULATORY_CARE_PROVIDER_SITE_OTHER): Payer: Medicare Other | Admitting: Podiatry

## 2014-09-15 VITALS — Ht 67.0 in | Wt 145.0 lb

## 2014-09-15 DIAGNOSIS — B351 Tinea unguium: Secondary | ICD-10-CM | POA: Diagnosis not present

## 2014-09-15 DIAGNOSIS — Q828 Other specified congenital malformations of skin: Secondary | ICD-10-CM

## 2014-09-15 DIAGNOSIS — M79673 Pain in unspecified foot: Secondary | ICD-10-CM | POA: Diagnosis not present

## 2014-09-15 NOTE — Progress Notes (Signed)
   Subjective:    Patient ID: Toni Carlson, female    DOB: 03-Aug-1945, 69 y.o.   MRN: [id number] Pt comes in today with callouses.. callouses plantar side both feet, and and one callouses medial side right. She states that she has been dealing with the callouses for away.  She has been trimming them away herself when she was able but now she is afraid to mess with them.   HPI    Review of Systems  Eyes: Positive for redness and itching.  Musculoskeletal: Positive for gait problem.  All other systems reviewed and are negative.      Objective:   Physical Exam: I have reviewed her past medical history medications allergy surgery social history and review of systems. Pulses are strongly palpable bilateral. Neurologic sensorium is intact per Semmes-Weinstein monofilament. Deep tendon reflexes are intact bilateral and muscle strength is 5 over 5 dorsiflexion and plantar flexors and inverters everters all intrinsic musculature is intact. Orthopedic evaluation and strains all joints distal to the ankle for range of motion without crepitation. With the exception of the second digits bilateral which are hammered and arthritic. She does have mild hallux valgus deformities hammertoe deformities bilateral. Porokeratosis are noted on physical exam by Y lateral with thick yellow dystrophic onychomycotic nails.        Assessment & Plan:  Assessment: Pain in limb secondary to onychomycosis and porokeratosis bilateral.  Plan: Debridement of all reactive hyperkeratoses porokeratosis corns and calluses. Debridement of nails 1 through 5 bilateral.

## 2014-09-16 NOTE — Progress Notes (Signed)
This encounter was created in error - please disregard.

## 2015-02-18 ENCOUNTER — Other Ambulatory Visit: Payer: Self-pay | Admitting: Vascular Surgery

## 2015-02-23 ENCOUNTER — Encounter
Admission: RE | Disposition: A | Discharge status: Home / Self Care | Payer: Self-pay | Source: Ambulatory Visit | Attending: Vascular Surgery

## 2015-02-23 ENCOUNTER — Encounter: Payer: Self-pay | Admitting: *Deleted

## 2015-02-23 ENCOUNTER — Ambulatory Visit
Admission: RE | Admit: 2015-02-23 | Discharge: 2015-02-23 | Disposition: A | Discharge status: Home / Self Care | Payer: Medicare Other | Source: Ambulatory Visit | Attending: Vascular Surgery | Admitting: Vascular Surgery

## 2015-02-23 DIAGNOSIS — N186 End stage renal disease: Secondary | ICD-10-CM | POA: Diagnosis not present

## 2015-02-23 DIAGNOSIS — Z992 Dependence on renal dialysis: Secondary | ICD-10-CM | POA: Insufficient documentation

## 2015-02-23 DIAGNOSIS — I509 Heart failure, unspecified: Secondary | ICD-10-CM | POA: Insufficient documentation

## 2015-02-23 DIAGNOSIS — D509 Iron deficiency anemia, unspecified: Secondary | ICD-10-CM | POA: Insufficient documentation

## 2015-02-23 DIAGNOSIS — T82858A Stenosis of vascular prosthetic devices, implants and grafts, initial encounter: Secondary | ICD-10-CM | POA: Insufficient documentation

## 2015-02-23 DIAGNOSIS — Y832 Surgical operation with anastomosis, bypass or graft as the cause of abnormal reaction of the patient, or of later complication, without mention of misadventure at the time of the procedure: Secondary | ICD-10-CM | POA: Insufficient documentation

## 2015-02-23 DIAGNOSIS — I2510 Atherosclerotic heart disease of native coronary artery without angina pectoris: Secondary | ICD-10-CM | POA: Insufficient documentation

## 2015-02-23 HISTORY — PX: PERIPHERAL VASCULAR CATHETERIZATION: SHX172C

## 2015-02-23 LAB — POTASSIUM (ARMC VASCULAR LAB ONLY): Potassium (ARMC vascular lab): 4.6

## 2015-02-23 SURGERY — A/V SHUNTOGRAM/FISTULAGRAM
Anesthesia: Moderate Sedation

## 2015-02-23 MED ORDER — LABETALOL HCL 5 MG/ML IV SOLN: 10.0000 mg | INTRAVENOUS | Status: DC | PRN

## 2015-02-23 MED ORDER — LIDOCAINE-EPINEPHRINE (PF) 1 %-1:200000 IJ SOLN
INTRAMUSCULAR | Status: DC | PRN
  Administered 2015-02-23: 10 mL via INTRADERMAL

## 2015-02-23 MED ORDER — MIDAZOLAM HCL 5 MG/5ML IJ SOLN
INTRAMUSCULAR | Status: AC
  Filled 2015-02-23: qty 5

## 2015-02-23 MED ORDER — IOHEXOL 300 MG/ML  SOLN
INTRAMUSCULAR | Status: DC | PRN
  Administered 2015-02-23: 30 mL via INTRAVENOUS

## 2015-02-23 MED ORDER — HEPARIN (PORCINE) IN NACL 2-0.9 UNIT/ML-% IJ SOLN
INTRAMUSCULAR | Status: AC
  Filled 2015-02-23: qty 1000

## 2015-02-23 MED ORDER — SODIUM CHLORIDE 0.9 % IV SOLN
INTRAVENOUS | Status: DC
  Administered 2015-02-23 (×2): via INTRAVENOUS

## 2015-02-23 MED ORDER — CHLORHEXIDINE GLUCONATE CLOTH 2 % EX PADS: 6.0000 | MEDICATED_PAD | Freq: Once | CUTANEOUS | Status: DC

## 2015-02-23 MED ORDER — LABETALOL HCL 5 MG/ML IV SOLN
INTRAVENOUS | Status: AC
  Filled 2015-02-23: qty 4

## 2015-02-23 MED ORDER — PHENOL 1.4 % MT LIQD
1.0000 | OROMUCOSAL | Status: DC | PRN
Start: 2015-02-23 — End: 2015-02-23

## 2015-02-23 MED ORDER — METOPROLOL TARTRATE 1 MG/ML IV SOLN: 2.0000 mg | INTRAVENOUS | Status: DC | PRN

## 2015-02-23 MED ORDER — ONDANSETRON HCL 4 MG/2ML IJ SOLN: 4.0000 mg | Freq: Four times a day (QID) | INTRAMUSCULAR | Status: DC | PRN

## 2015-02-23 MED ORDER — ACETAMINOPHEN 325 MG PO TABS: 325.0000 mg | ORAL_TABLET | ORAL | Status: DC | PRN

## 2015-02-23 MED ORDER — MORPHINE SULFATE (PF) 4 MG/ML IV SOLN: 2.0000 mg | INTRAVENOUS | Status: DC | PRN

## 2015-02-23 MED ORDER — LIDOCAINE-EPINEPHRINE (PF) 1 %-1:200000 IJ SOLN
INTRAMUSCULAR | Status: AC
  Filled 2015-02-23: qty 30

## 2015-02-23 MED ORDER — ALUM & MAG HYDROXIDE-SIMETH 200-200-20 MG/5ML PO SUSP: 15.0000 mL | ORAL | Status: DC | PRN

## 2015-02-23 MED ORDER — LABETALOL HCL 5 MG/ML IV SOLN
20.0000 mg | Freq: Once | INTRAVENOUS | Status: AC
  Administered 2015-02-23: 20 mg via INTRAVENOUS

## 2015-02-23 MED ORDER — DEXTROSE 5 % IV SOLN
INTRAVENOUS | Status: AC
  Filled 2015-02-23: qty 1.5

## 2015-02-23 MED ORDER — FENTANYL CITRATE (PF) 100 MCG/2ML IJ SOLN
INTRAMUSCULAR | Status: DC | PRN
  Administered 2015-02-23 (×3): 50 ug via INTRAVENOUS

## 2015-02-23 MED ORDER — HEPARIN SODIUM (PORCINE) 1000 UNIT/ML IJ SOLN
INTRAMUSCULAR | Status: DC | PRN
Start: 2015-02-23 — End: 2015-02-23
  Administered 2015-02-23: 3000 [IU] via INTRAVENOUS

## 2015-02-23 MED ORDER — DEXTROSE 5 % IV SOLN
1.5000 g | INTRAVENOUS | Status: AC
  Administered 2015-02-23: 1.5 g via INTRAVENOUS

## 2015-02-23 MED ORDER — ACETAMINOPHEN 325 MG RE SUPP: 325.0000 mg | RECTAL | Status: DC | PRN

## 2015-02-23 MED ORDER — GUAIFENESIN-DM 100-10 MG/5ML PO SYRP: 15.0000 mL | ORAL_SOLUTION | ORAL | Status: DC | PRN

## 2015-02-23 MED ORDER — CEFAZOLIN SODIUM 1-5 GM-% IV SOLN
INTRAVENOUS | Status: AC
  Filled 2015-02-23: qty 50

## 2015-02-23 MED ORDER — OXYCODONE-ACETAMINOPHEN 5-325 MG PO TABS: 1.0000 | ORAL_TABLET | ORAL | Status: DC | PRN

## 2015-02-23 MED ORDER — SODIUM CHLORIDE 0.9 % IV SOLN
500.0000 mL | Freq: Once | INTRAVENOUS | Status: DC | PRN
Start: 2015-02-23 — End: 2015-02-23

## 2015-02-23 MED ORDER — HYDRALAZINE HCL 20 MG/ML IJ SOLN: 5.0000 mg | INTRAMUSCULAR | Status: DC | PRN

## 2015-02-23 MED ORDER — FENTANYL CITRATE (PF) 100 MCG/2ML IJ SOLN
INTRAMUSCULAR | Status: AC
  Filled 2015-02-23: qty 2

## 2015-02-23 MED ORDER — MIDAZOLAM HCL 2 MG/2ML IJ SOLN
INTRAMUSCULAR | Status: DC | PRN
  Administered 2015-02-23: 2 mg via INTRAVENOUS
  Administered 2015-02-23 (×2): 1 mg via INTRAVENOUS

## 2015-02-23 SURGICAL SUPPLY — 12 items
BALLN DORADO 8X60X80 (BALLOONS) ×3
BALLN LUTONIX DCB 7X60X130 (BALLOONS) ×3
BALLOON DORADO 8X60X80 (BALLOONS) ×1 IMPLANT
BALLOON LUTONIX DCB 7X60X130 (BALLOONS) ×1 IMPLANT
DEVICE PRESTO INFLATION (MISCELLANEOUS) ×3 IMPLANT
DRAPE BRACHIAL (DRAPES) ×3 IMPLANT
PACK ANGIOGRAPHY (CUSTOM PROCEDURE TRAY) ×3 IMPLANT
SET INTRO CAPELLA COAXIAL (SET/KITS/TRAYS/PACK) ×3 IMPLANT
SHEATH BRITE TIP 6FRX5.5 (SHEATH) ×3 IMPLANT
SUT MNCRL AB 4-0 PS2 18 (SUTURE) ×3 IMPLANT
TOWEL OR 17X26 4PK STRL BLUE (TOWEL DISPOSABLE) ×3 IMPLANT
WIRE MAGIC TORQUE 260C (WIRE) ×3 IMPLANT

## 2015-02-23 NOTE — OR Nursing (Signed)
Pt BP and hEART RATE REMAINS ELEVATE:(see flow sheet). Reported to Dr. Lucky Cowboy. Ordered 20 mg Labetolol IV. Given with result as follows: HR: 71...SR, BP 146/74. PT instructed per Dr. Lucky Cowboy to take AM meds upon arrival home,(PT had not taken any AM meds)

## 2015-02-23 NOTE — Discharge Instructions (Signed)
Fistulogram, Care After Refer to this sheet in the next few weeks. These instructions provide you with information on caring for yourself after your procedure. Your health care provider may also give you more specific instructions. Your treatment has been planned according to current medical practices, but problems sometimes occur. Call your health care provider if you have any problems or questions after your procedure. WHAT TO EXPECT AFTER THE PROCEDURE After your procedure, it is typical to have the following:  A small amount of discomfort in the area where the catheters were placed.  A small amount of bruising around the fistula.  Sleepiness and fatigue. HOME CARE INSTRUCTIONS  Rest at home for the day following your procedure.  Do not drive or operate heavy machinery while taking pain medicine.  Take medicines only as directed by your health care provider.  Do not take baths, swim, or use a hot tub until your health care provider approves. You may shower 24 hours after the procedure or as directed by your health care provider.  There are many different ways to close and cover an incision, including stitches, skin glue, and adhesive strips. Follow your health care provider's instructions on:  Incision care.  Bandage (dressing) changes and removal.  Incision closure removal.  Monitor your dialysis fistula carefully. SEEK MEDICAL CARE IF:  You have drainage, redness, swelling, or pain at your catheter site.  You have a fever.  You have chills. SEEK IMMEDIATE MEDICAL CARE IF:  You feel weak.  You have trouble balancing.  You have trouble moving your arms or legs.  You have problems with your speech or vision.  You can no longer feel a vibration or buzz when you put your fingers over your dialysis fistula.  The limb that was used for the procedure:  Swells.  Is painful.  Is cold.  Is discolored, such as blue or pale white.   This information is not intended  to replace advice given to you by your health care provider. Make sure you discuss any questions you have with your health care provider.   Document Released: 09/16/2013 Document Reviewed: 09/16/2013 Elsevier Interactive Patient Education [employer]. The drugs you were given will stay in your system until tomorrow, so for the next 24 hours you should not.  Drive an automobile. Make any legal decisions.  Drink any alcoholic beverages.  Today you should start with liquids and gradually work up to solid foods as your are able to tolerate them  Resume your regular medications as prescribed by your doctor.  Avoid aspirin for 24 hours.    Change the Band-Aid or dressing as needed.  After a 2 days no dressing as needed.  Avoid strenuous activity for the remainder of the day.  Please notify your primary physician immediately if you have any unusual bleeding, trouble breathing, fever >100 degrees or pain not relieved by the medication your doctor prescribed for your doctor prescribed for you physician  Return to diaslysis  tommorow.

## 2015-02-23 NOTE — Op Note (Signed)
[facility]    OPERATIVE NOTE   PROCEDURE: 1.   Left brachiocephalic arteriovenous fistula cannulation under ultrasound guidance 2.   left arm fistulagram including central venogram 3.   Percutaneous transluminal angioplasty of left cephalic vein subclavian vein confluence with 7 mm diameter by 6 cm length Lutonix drug-coated angioplasty balloon and 8 mm diameter by 6 cm length high pressure angioplasty balloon  PRE-OPERATIVE DIAGNOSIS: 1. ESRD 2. 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 female who presents with malfunctioning left brachiocephalic arteriovenous fistula.  The patient is scheduled for left armlagram.  The patient is aware the risks include but are not limited to: bleeding, infection, thrombosis of the cannulated access, and possible anaphylactic reaction to the contrast.  The patient is aware of the risks of the procedure and elects to proceed forward.  DESCRIPTION: After full informed written consent was obtained, the patient was brought back to the angiography suite and placed supine upon the angiography table.  The patient was connected to monitoring equipment.  The left arm was prepped and draped in the standard fashion for a percutaneous access intervention.  Under ultrasound guidance, the  Left brachiocephalic arteriovenous fistula was cannulated with a micropuncture needle under direct ultrasound guidance and a permanent image was performed.  The microwire was advanced into the fistula and the needle was exchanged for the a microsheath.  I then upsized to a 6 Fr Sheath and  imaging was performed.  Hand injections were completed to image the access including the central venous system. 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  Based on the images, this patient will need intervention to this area. I then gave the patient 3000 units of intravenous heparin.  I then crossed the stenosis with a Magic Tourqe wire.  Based on the imaging, a 7 mm x 6 cm  Lutonix drug coated angioplasty balloon was selected.  The balloon was centered around the cephalic vein/subclavian vein stenosis and inflated to 14 ATM for 1 minute(s).  This was undersized, so I increased to an 8 mm x 6 cm high pressure balloon in this location.  This was inflated to 16 ATM for one minute. On completion imaging, a 25-30% residual stenosis was present that was not flow limiting.     Based on the completion imaging, no further intervention is necessary.  The wire and balloon were removed from the sheath.  A 4-0 Monocryl purse-string suture was sewn around the sheath.  The sheath was removed while tying down the suture.  A sterile bandage was applied to the puncture site.  COMPLICATIONS: None  CONDITION: Stable   DEW,JASON  02/23/2015 9:30 AM

## 2015-02-23 NOTE — H&P (Signed)
  [facility] History & Physical Update  The patient was interviewed and re-examined.  The patient's previous History and Physical has been reviewed and is unchanged.  There is no change in the plan of care. We plan to proceed with the scheduled procedure.  DEW,JASON, MD  02/23/2015, 8:44 AM

## 2015-02-24 ENCOUNTER — Encounter: Payer: Self-pay | Admitting: Vascular Surgery

## 2015-03-26 ENCOUNTER — Emergency Department: Payer: Medicare Other

## 2015-03-26 ENCOUNTER — Inpatient Hospital Stay (HOSPITAL_COMMUNITY)
Admit: 2015-03-26 | Discharge: 2015-03-26 | Disposition: A | Discharge status: Home / Self Care | Payer: Medicare Other | Attending: Internal Medicine | Admitting: Internal Medicine

## 2015-03-26 ENCOUNTER — Inpatient Hospital Stay
Admission: EM | Admit: 2015-03-26 | Discharge: 2015-04-01 | DRG: 064 | Disposition: A | Discharge status: Home Health | Payer: Medicare Other | Attending: Internal Medicine | Admitting: Internal Medicine

## 2015-03-26 DIAGNOSIS — I132 Hypertensive heart and chronic kidney disease with heart failure and with stage 5 chronic kidney disease, or end stage renal disease: Secondary | ICD-10-CM | POA: Diagnosis present

## 2015-03-26 DIAGNOSIS — N2581 Secondary hyperparathyroidism of renal origin: Secondary | ICD-10-CM | POA: Diagnosis present

## 2015-03-26 DIAGNOSIS — Z7901 Long term (current) use of anticoagulants: Secondary | ICD-10-CM | POA: Diagnosis not present

## 2015-03-26 DIAGNOSIS — R29810 Facial weakness: Secondary | ICD-10-CM | POA: Diagnosis present

## 2015-03-26 DIAGNOSIS — I272 Other secondary pulmonary hypertension: Secondary | ICD-10-CM | POA: Diagnosis present

## 2015-03-26 DIAGNOSIS — I482 Chronic atrial fibrillation: Secondary | ICD-10-CM | POA: Diagnosis not present

## 2015-03-26 DIAGNOSIS — K219 Gastro-esophageal reflux disease without esophagitis: Secondary | ICD-10-CM | POA: Diagnosis present

## 2015-03-26 DIAGNOSIS — Z992 Dependence on renal dialysis: Secondary | ICD-10-CM | POA: Diagnosis not present

## 2015-03-26 DIAGNOSIS — R7989 Other specified abnormal findings of blood chemistry: Secondary | ICD-10-CM

## 2015-03-26 DIAGNOSIS — R4781 Slurred speech: Secondary | ICD-10-CM | POA: Diagnosis present

## 2015-03-26 DIAGNOSIS — F17210 Nicotine dependence, cigarettes, uncomplicated: Secondary | ICD-10-CM | POA: Diagnosis present

## 2015-03-26 DIAGNOSIS — I679 Cerebrovascular disease, unspecified: Secondary | ICD-10-CM

## 2015-03-26 DIAGNOSIS — R451 Restlessness and agitation: Secondary | ICD-10-CM | POA: Diagnosis not present

## 2015-03-26 DIAGNOSIS — Z79899 Other long term (current) drug therapy: Secondary | ICD-10-CM | POA: Diagnosis not present

## 2015-03-26 DIAGNOSIS — G4733 Obstructive sleep apnea (adult) (pediatric): Secondary | ICD-10-CM | POA: Diagnosis present

## 2015-03-26 DIAGNOSIS — I2510 Atherosclerotic heart disease of native coronary artery without angina pectoris: Secondary | ICD-10-CM | POA: Diagnosis present

## 2015-03-26 DIAGNOSIS — I481 Persistent atrial fibrillation: Secondary | ICD-10-CM | POA: Diagnosis not present

## 2015-03-26 DIAGNOSIS — I639 Cerebral infarction, unspecified: Secondary | ICD-10-CM

## 2015-03-26 DIAGNOSIS — R471 Dysarthria and anarthria: Secondary | ICD-10-CM | POA: Diagnosis present

## 2015-03-26 DIAGNOSIS — D638 Anemia in other chronic diseases classified elsewhere: Secondary | ICD-10-CM

## 2015-03-26 DIAGNOSIS — I5032 Chronic diastolic (congestive) heart failure: Secondary | ICD-10-CM | POA: Diagnosis present

## 2015-03-26 DIAGNOSIS — I4891 Unspecified atrial fibrillation: Secondary | ICD-10-CM

## 2015-03-26 DIAGNOSIS — I10 Essential (primary) hypertension: Secondary | ICD-10-CM

## 2015-03-26 DIAGNOSIS — I350 Nonrheumatic aortic (valve) stenosis: Secondary | ICD-10-CM

## 2015-03-26 DIAGNOSIS — I081 Rheumatic disorders of both mitral and tricuspid valves: Secondary | ICD-10-CM | POA: Diagnosis present

## 2015-03-26 DIAGNOSIS — N186 End stage renal disease: Secondary | ICD-10-CM | POA: Diagnosis present

## 2015-03-26 DIAGNOSIS — I63411 Cerebral infarction due to embolism of right middle cerebral artery: Principal | ICD-10-CM | POA: Diagnosis present

## 2015-03-26 DIAGNOSIS — D631 Anemia in chronic kidney disease: Secondary | ICD-10-CM | POA: Diagnosis present

## 2015-03-26 DIAGNOSIS — E785 Hyperlipidemia, unspecified: Secondary | ICD-10-CM | POA: Diagnosis present

## 2015-03-26 DIAGNOSIS — R778 Other specified abnormalities of plasma proteins: Secondary | ICD-10-CM

## 2015-03-26 DIAGNOSIS — R531 Weakness: Secondary | ICD-10-CM

## 2015-03-26 DIAGNOSIS — G8194 Hemiplegia, unspecified affecting left nondominant side: Secondary | ICD-10-CM | POA: Diagnosis present

## 2015-03-26 DIAGNOSIS — R748 Abnormal levels of other serum enzymes: Secondary | ICD-10-CM | POA: Diagnosis present

## 2015-03-26 HISTORY — DX: Secondary hyperparathyroidism of renal origin: N25.81

## 2015-03-26 HISTORY — DX: Anemia, unspecified: D64.9

## 2015-03-26 HISTORY — DX: End stage renal disease: N18.6

## 2015-03-26 LAB — CBC
HCT: 36.5 % (ref 35.0–47.0)
HEMOGLOBIN: 11.5 g/dL — AB (ref 12.0–16.0)
MCH: 28 pg (ref 26.0–34.0)
MCHC: 31.3 g/dL — ABNORMAL LOW (ref 32.0–36.0)
MCV: 89.2 fL (ref 80.0–100.0)
PLATELETS: 146 10*3/uL — AB (ref 150–440)
RBC: 4.1 MIL/uL (ref 3.80–5.20)
RDW: 19.8 % — ABNORMAL HIGH (ref 11.5–14.5)
WBC: 5.6 10*3/uL (ref 3.6–11.0)

## 2015-03-26 LAB — APTT: aPTT: 35 seconds (ref 24–36)

## 2015-03-26 LAB — DIFFERENTIAL
Basophils Absolute: 0 10*3/uL (ref 0–0.1)
Basophils Relative: 1 %
Eosinophils Absolute: 0.1 10*3/uL (ref 0–0.7)
Eosinophils Relative: 1 %
LYMPHS ABS: 0.4 10*3/uL — AB (ref 1.0–3.6)
LYMPHS PCT: 8 %
MONOS PCT: 9 %
Monocytes Absolute: 0.5 10*3/uL (ref 0.2–0.9)
NEUTROS ABS: 4.6 10*3/uL (ref 1.4–6.5)
NEUTROS PCT: 81 %

## 2015-03-26 LAB — COMPREHENSIVE METABOLIC PANEL
ALT: 13 U/L — AB (ref 14–54)
AST: 18 U/L (ref 15–41)
Albumin: 3.4 g/dL — ABNORMAL LOW (ref 3.5–5.0)
Alkaline Phosphatase: 294 U/L — ABNORMAL HIGH (ref 38–126)
Anion gap: 13 (ref 5–15)
BILIRUBIN TOTAL: 0.8 mg/dL (ref 0.3–1.2)
BUN: 30 mg/dL — AB (ref 6–20)
CHLORIDE: 98 mmol/L — AB (ref 101–111)
CO2: 30 mmol/L (ref 22–32)
CREATININE: 4.88 mg/dL — AB (ref 0.44–1.00)
Calcium: 9.7 mg/dL (ref 8.9–10.3)
GFR calc Af Amer: 10 mL/min — ABNORMAL LOW (ref >60)
GFR, EST NON AFRICAN AMERICAN: 8 mL/min — AB (ref >60)
Glucose, Bld: 100 mg/dL — ABNORMAL HIGH (ref 65–99)
Potassium: 3.2 mmol/L — ABNORMAL LOW (ref 3.5–5.1)
Sodium: 141 mmol/L (ref 135–145)
TOTAL PROTEIN: 7.2 g/dL (ref 6.5–8.1)

## 2015-03-26 LAB — PROTIME-INR
INR: 1.14
PROTHROMBIN TIME: 14.8 s (ref 11.4–15.0)

## 2015-03-26 LAB — LIPID PANEL
Cholesterol: 171 mg/dL (ref 0–200)
HDL: 48 mg/dL (ref >40)
LDL CALC: 107 mg/dL — AB (ref 0–99)
TRIGLYCERIDES: 79 mg/dL (ref <150)
Total CHOL/HDL Ratio: 3.6 RATIO
VLDL: 16 mg/dL (ref 0–40)

## 2015-03-26 LAB — TROPONIN I
TROPONIN I: 0.14 ng/mL — AB (ref <0.031)
TROPONIN I: 0.14 ng/mL — AB (ref <0.031)

## 2015-03-26 MED ORDER — ALENDRONATE SODIUM 10 MG PO TABS: 10.0000 mg | ORAL_TABLET | Freq: Every day | ORAL | Status: DC

## 2015-03-26 MED ORDER — ACETAMINOPHEN 650 MG RE SUPP: 650.0000 mg | Freq: Four times a day (QID) | RECTAL | Status: DC | PRN

## 2015-03-26 MED ORDER — CALCIUM ACETATE (PHOS BINDER) 667 MG/5ML PO SOLN
2668.0000 mg | Freq: Three times a day (TID) | ORAL | Status: DC
  Administered 2015-03-26 – 2015-04-01 (×14): 2668 mg via ORAL
  Filled 2015-03-26 (×21): qty 20

## 2015-03-26 MED ORDER — ASPIRIN EC 81 MG PO TBEC
81.0000 mg | DELAYED_RELEASE_TABLET | Freq: Every day | ORAL | Status: DC
  Administered 2015-03-27: 81 mg via ORAL
  Filled 2015-03-26: qty 1

## 2015-03-26 MED ORDER — ONDANSETRON HCL 4 MG/2ML IJ SOLN: 4.0000 mg | Freq: Four times a day (QID) | INTRAMUSCULAR | Status: DC | PRN

## 2015-03-26 MED ORDER — SODIUM CHLORIDE 0.9 % IJ SOLN
3.0000 mL | Freq: Two times a day (BID) | INTRAMUSCULAR | Status: DC
  Administered 2015-03-26 – 2015-04-01 (×9): 3 mL via INTRAVENOUS

## 2015-03-26 MED ORDER — ACETAMINOPHEN 325 MG PO TABS: 650.0000 mg | ORAL_TABLET | Freq: Four times a day (QID) | ORAL | Status: DC | PRN

## 2015-03-26 MED ORDER — HYDRALAZINE HCL 50 MG PO TABS
50.0000 mg | ORAL_TABLET | Freq: Every evening | ORAL | Status: DC
  Administered 2015-03-26: 18:00:00 50 mg via ORAL
  Filled 2015-03-26: qty 1

## 2015-03-26 MED ORDER — ASPIRIN 81 MG PO CHEW
324.0000 mg | CHEWABLE_TABLET | Freq: Once | ORAL | Status: AC
  Administered 2015-03-26: 324 mg via ORAL
  Filled 2015-03-26: qty 4

## 2015-03-26 MED ORDER — AMLODIPINE BESYLATE 10 MG PO TABS
10.0000 mg | ORAL_TABLET | Freq: Every day | ORAL | Status: DC
  Administered 2015-03-27 – 2015-04-01 (×5): 10 mg via ORAL
  Filled 2015-03-26 (×5): qty 1

## 2015-03-26 MED ORDER — PANTOPRAZOLE SODIUM 40 MG PO TBEC
40.0000 mg | DELAYED_RELEASE_TABLET | Freq: Two times a day (BID) | ORAL | Status: DC
Start: 2015-03-26 — End: 2015-04-01
  Administered 2015-03-26 – 2015-04-01 (×12): 40 mg via ORAL
  Filled 2015-03-26 (×12): qty 1

## 2015-03-26 MED ORDER — SEVELAMER HCL 400 MG PO TABS
400.0000 mg | ORAL_TABLET | Freq: Three times a day (TID) | ORAL | Status: DC
  Filled 2015-03-26 (×2): qty 1

## 2015-03-26 MED ORDER — HEPARIN SODIUM (PORCINE) 5000 UNIT/ML IJ SOLN
5000.0000 [IU] | Freq: Three times a day (TID) | INTRAMUSCULAR | Status: DC
  Administered 2015-03-26 – 2015-03-27 (×3): 5000 [IU] via SUBCUTANEOUS
  Filled 2015-03-26 (×3): qty 1

## 2015-03-26 MED ORDER — POLYETHYLENE GLYCOL 3350 17 G PO PACK
17.0000 g | PACK | Freq: Every day | ORAL | Status: DC | PRN
Start: 2015-03-26 — End: 2015-04-01

## 2015-03-26 MED ORDER — LOSARTAN POTASSIUM 50 MG PO TABS
100.0000 mg | ORAL_TABLET | Freq: Every day | ORAL | Status: DC
  Administered 2015-03-26 – 2015-04-01 (×6): 100 mg via ORAL
  Filled 2015-03-26 (×6): qty 2

## 2015-03-26 MED ORDER — DOCUSATE SODIUM 100 MG PO CAPS
100.0000 mg | ORAL_CAPSULE | Freq: Two times a day (BID) | ORAL | Status: DC
  Administered 2015-03-26 – 2015-03-30 (×6): 100 mg via ORAL
  Filled 2015-03-26 (×11): qty 1

## 2015-03-26 MED ORDER — HYDRALAZINE HCL 25 MG PO TABS
25.0000 mg | ORAL_TABLET | Freq: Every morning | ORAL | Status: DC
  Administered 2015-03-27: 25 mg via ORAL
  Filled 2015-03-26: qty 1

## 2015-03-26 MED ORDER — ATORVASTATIN CALCIUM 20 MG PO TABS
40.0000 mg | ORAL_TABLET | Freq: Every day | ORAL | Status: DC
  Administered 2015-03-26 – 2015-03-31 (×6): 40 mg via ORAL
  Filled 2015-03-26 (×6): qty 2

## 2015-03-26 MED ORDER — ONDANSETRON HCL 4 MG PO TABS: 4.0000 mg | ORAL_TABLET | Freq: Four times a day (QID) | ORAL | Status: DC | PRN

## 2015-03-26 MED ORDER — HYDRALAZINE HCL 25 MG PO TABS: 25.0000 mg | ORAL_TABLET | Freq: Three times a day (TID) | ORAL | Status: DC

## 2015-03-26 NOTE — ED Notes (Signed)
Stroke nurse summary: Pt arrived to ED POV, instructed to come to the ED after receiving dialysis for assessment of left sided facial droop noticed upon arrival to dialysis clinic at 0500. Pt's daughter states that her mother woke up yesterday with slurred speech at 1100, speech has not improved, today pt has left facial droop and drooling.  With the onset of symptoms outside of the 8 hr window, the code stroke has been cancelled, EDP aware, CT shows possible new infarct, pt and family updated on status and results, Dr. Thomasene Lot with patient. The pt has an initial NIHSS of 3 for facial droop and slight dysarthria. Grip strength slightly weaker on pt's left side, pt states that is the side she just got dialysis on and her arm is sore. Tina RN updated on current status and will continue to monitor the pt's neuro status.

## 2015-03-26 NOTE — Progress Notes (Signed)
Patient now noted to be in Afib, no prior history. ECHO ordered to r/o thrombus. EKG to confirm Rate is controlled. Continue telemetry. Due to acute CVA- will hold off on anticoagulation now. Await neurology consult

## 2015-03-26 NOTE — Plan of Care (Signed)
Problem: Education: Goal: Knowledge of disease or condition will improve Outcome: Progressing Stroke booklet give to pt. Pt lives at home, daughter at bedside, pt with history of CAD, CHF, ESRD, anemia, hyperparathyroidism, and HTN on home meds    Goal: Knowledge of secondary prevention will improve Outcome: Progressing Stroke booklet given to pt and family, informed of stroke protocol and the importance of NIH and q 2 VS and neuros through the night.  Goal: Knowledge of patient specific risk factors addressed and post discharge goals established will improve Outcome: Progressing Stoke booklet given to pt

## 2015-03-26 NOTE — Progress Notes (Signed)
WORK NOTE   Toni Carlson has been with her mother Ms. LANETTA SARAH who has been admitted to the hospital at [facility] for an acute medical condition on 03/26/2015. So please excuse Mrs. Faucette from work today.  Please call if any questions.  Gladstone Lighter M.D. [facility] [address] [place], [place] [address] Ph: [phone]

## 2015-03-26 NOTE — Progress Notes (Signed)

## 2015-03-26 NOTE — ED Notes (Signed)
Pt returned from MRI and u/s, admitting md at bedside. No change in pt neuro status.

## 2015-03-26 NOTE — H&P (Addendum)
[place] [facility] [place] NAME: Toni Carlson    MR#:  [id number]  DATE OF BIRTH:  19-Nov-1945  DATE OF ADMISSION:  03/26/2015  PRIMARY CARE PHYSICIAN: Murlean Iba, MD   REQUESTING/REFERRING PHYSICIAN: Dr. Francene Castle  CHIEF COMPLAINT:   Chief Complaint  Patient presents with  . Code Stroke    HISTORY OF PRESENT ILLNESS:  Toni Carlson  is a 69 y.o. female with a known history of end-stage renal disease on Tuesday Thursday Saturday hemodialysis, hypertension, anemia of chronic disease presents to the hospital secondary to slurred speech and also facial droop noticed today at dialysis. Symptoms actually started yesterday when her speech was slurred noticed by her family, but patient thought it was secondary to her cold and congestion that she was having thick slurred speech. She denied any weakness or sensory changes otherwise.. Today at dialysis she was noted to have left facial droop and drooling and so sent to the emergency room. She has come outside the 2 hour window for TPA. She still has dysarthria and left facial droop and weakness of her left arm. No fevers or chills. No sensory changes. No dysphagia noted. Denies any other complaints. No prior history of strokes. CT of the head reveals possible right frontotemporal infarct. Patient is being admitted for further stroke workup.  PAST MEDICAL HISTORY:   Past Medical History  Diagnosis Date  . Hypertension   . ESRD (end stage renal disease) ([facility])     On Tue-Thur-Sat dialysis  . CHF (congestive heart failure) ([facility])     Diastolic heart failure  . Anemia   . Hyperparathyroidism, secondary renal ([facility])     PAST SURGICAL HISTORY:   Past Surgical History  Procedure Laterality Date  . Peripheral vascular catheterization N/A 02/23/2015    Procedure: A/V Shuntogram/Fistulagram;  Surgeon: Algernon Huxley, MD;  Location: [facility];  Service: Cardiovascular;  Laterality: N/A;  .  Peripheral vascular catheterization N/A 02/23/2015    Procedure: A/V Shunt Intervention;  Surgeon: Algernon Huxley, MD;  Location: [facility];  Service: Cardiovascular;  Laterality: N/A;    SOCIAL HISTORY:   Social History  Substance Use Topics  . Smoking status: Current Some Day Smoker -- 0.25 packs/day for 30 years    Types: Cigarettes  . Smokeless tobacco: Never Used  . Alcohol Use: No    FAMILY HISTORY:   Family History  Problem Relation Age of Onset  . CAD Father   . Dementia Mother     DRUG ALLERGIES:  No Known Allergies  REVIEW OF SYSTEMS:   Review of Systems  Constitutional: Negative for fever, chills, weight loss and malaise/fatigue.  HENT: Negative for ear discharge, ear pain, nosebleeds and tinnitus.   Eyes: Negative for blurred vision, double vision and photophobia.  Respiratory: Positive for cough. Negative for hemoptysis, shortness of breath and wheezing.   Cardiovascular: Negative for chest pain, palpitations, orthopnea and leg swelling.  Gastrointestinal: Negative for heartburn, nausea, vomiting, abdominal pain, diarrhea, constipation and melena.  Genitourinary:       Patient is anuric as she is dialysis patient  Musculoskeletal: Negative for myalgias, back pain and neck pain.  Skin: Negative for rash.  Neurological: Positive for speech change and focal weakness. Negative for dizziness, tremors, sensory change, loss of consciousness and headaches.  Endo/Heme/Allergies: Does not bruise/bleed easily.  Psychiatric/Behavioral: Negative for depression.    MEDICATIONS AT HOME:   Prior to Admission medications   Medication Sig Start  Date End Date Taking? Authorizing Provider  alendronate (FOSAMAX) 10 MG tablet Take by mouth.    Historical Provider, MD  amLODipine (NORVASC) 10 MG tablet Take 10 mg by mouth daily.    Historical Provider, MD  amLODipine (NORVASC) 10 MG tablet Take by mouth 2 (two) times daily.     Historical Provider, MD  hydrALAZINE  (APRESOLINE) 25 MG tablet Take 25 mg by mouth 3 (three) times daily.    Historical Provider, MD  hydrALAZINE (APRESOLINE) 25 MG tablet Take by mouth.    Historical Provider, MD  losartan (COZAAR) 100 MG tablet Take 100 mg by mouth daily.    Historical Provider, MD  losartan (COZAAR) 100 MG tablet Take by mouth.    Historical Provider, MD  NEXIUM 40 MG capsule Take 40 mg by mouth 2 (two) times daily before a meal.  09/11/14   Historical Provider, MD  SENSIPAR 90 MG tablet  08/14/14   Historical Provider, MD  sevelamer carbonate (RENVELA) 2.4 G PACK Take by mouth.    Historical Provider, MD  SEVELAMER HCL PO Take by mouth. 2.4gm powder    Historical Provider, MD      VITAL SIGNS:  Blood pressure 162/68, pulse 70, temperature 98.2 F (36.8 C), temperature source Oral, resp. rate 18, height 5\' 6"  (1.676 m), weight 68.04 kg (150 lb), SpO2 100 %.  PHYSICAL EXAMINATION:   Physical Exam  GENERAL:  69 y.o.-year-old patient lying in the bed with no acute distress.  EYES: Pupils equal, round, reactive to light and accommodation. No scleral icterus. Extraocular muscles intact.  HEENT: Head atraumatic, normocephalic. Oropharynx and nasopharynx clear.  NECK:  Supple, no jugular venous distention. No thyroid enlargement, no tenderness.  LUNGS: Normal breath sounds bilaterally, no wheezing, rales,rhonchi or crepitation. No use of accessory muscles of respiration. Decreased bibasilar breath sounds CARDIOVASCULAR: S1, S2 normal. No murmurs, rubs, or gallops.  ABDOMEN: Soft, nontender, nondistended. Bowel sounds present. No organomegaly or mass.  EXTREMITIES: No pedal edema, cyanosis, or clubbing. Left arm AV fistula with good thrill present. NEUROLOGIC: Cranial nerves II through XII are intact except for left facial droop and dysarthria. Muscle strength 5/5 in right upper and right lower extremities. Left lower extremity is 5/5 but  left upper extremity is 4/ 5 strength. Sensation intact. Gait not checked.   PSYCHIATRIC: The patient is alert and oriented x 3.  SKIN: No obvious rash, lesion, or ulcer.   LABORATORY PANEL:   CBC  Recent Labs Lab 03/26/15 1149  WBC 5.6  HGB 11.5*  HCT 36.5  PLT 146*   ------------------------------------------------------------------------------------------------------------------  Chemistries   Recent Labs Lab 03/26/15 1149  NA 141  K 3.2*  CL 98*  CO2 30  GLUCOSE 100*  BUN 30*  CREATININE 4.88*  CALCIUM 9.7  AST 18  ALT 13*  ALKPHOS 294*  BILITOT 0.8   ------------------------------------------------------------------------------------------------------------------  Cardiac Enzymes  Recent Labs Lab 03/26/15 1149  TROPONINI 0.14*   ------------------------------------------------------------------------------------------------------------------  RADIOLOGY:  Ct Head Wo Contrast  03/26/2015  CLINICAL DATA:  Acute onset left-sided paralysis and slurred speech. Chronic renal failure EXAM: CT HEAD WITHOUT CONTRAST TECHNIQUE: Contiguous axial images were obtained from the base of the skull through the vertex without intravenous contrast. COMPARISON:  None. FINDINGS: There is mild diffuse atrophy. There is no intracranial mass, hemorrhage, extra-axial fluid collection, or midline shift. There is patchy small vessel disease throughout the centra semiovale bilaterally. There is a focal area of decreased attenuation in the right frontal -temporal junction, concerning for early  acute infarct. Attenuation of the middle cerebral arteries is symmetric and normal bilaterally. The bony calvarium appears intact. The mastoid air cells are clear. There is carotid artery calcification in the siphon region bilaterally. There are bony changes in the calvarium consistent with secondary hyperparathyroidism from chronic renal failure. Mastoid air cells are clear bilaterally. There is opacification of a left-sided ethmoid air cell. IMPRESSION: Suspect early infarct  at the right frontal -temporal junction. There is mild atrophy with patchy small vessel disease throughout the centra semiovale bilaterally. No hemorrhage. Bony changes consistent with secondary hyperparathyroidism/chronic renal failure noted. Mild left-sided ethmoid sinus disease. Critical Value/emergent results were called by telephone at the time of interpretation on 03/26/2015 at 11:49 am to Dr. Meade Maw, ED physician , who verbally acknowledged these results. Electronically Signed   By: Lowella Grip III M.D.   On: 03/26/2015 11:51    EKG:   Orders placed or performed during the hospital encounter of 03/26/15  . ED EKG  . ED EKG  . EKG 12-Lead  . EKG 12-Lead  . EKG 12-Lead  . EKG 12-Lead    IMPRESSION AND PLAN:   69 year old female with past medical history significant for end-stage renal disease on Tuesday Thursday Saturday hemodialysis, hypertension, anemia of chronic disease presents to the hospital secondary to slurred speech and also facial droop noticed today at dialysis.  #1 Acute CVA-left-sided weakness and left facial droop on exam and dysarthria. -CT of the head with possible right frontotemporal infarct. -Admit to telemetry, recycle troponins as first troponin is elevated. Could be from her renal disease as well. -Neuro checks, speech consult, physical therapy consult and occupational therapy consult requested. -MRI and MRA of the brain ordered. Carotid Dopplers and echocardiogram ordered. -Patient not on any anticoagulation at home - so will start on aspirin - check lipid profile and start on statin.  #2 end-stage renal disease-on Tuesday Thursday Saturday hemodialysis. The next dialysis is due this Saturday. -Nephrology consulted. -Patient had nausea/vomiting with PhosLo. Continue Renvela  #3 hypertension-since her symptoms started greater than 24 hours ago, we'll restart her blood pressure medications. -Continue Norvasc, losartan and hydralazine.  #4  anemia of chronic disease-hemoglobin is stable and is at 11.5. So Procrit is not indicated  #5 DVT prophylaxis-started on subcutaneous heparin   All the records are reviewed and case discussed with ED provider. Management plans discussed with the patient, family and they are in agreement.  CODE STATUS: Full Code  TOTAL TIME TAKING CARE OF THIS PATIENT: 50 minutes.    Arzell Mcgeehan M.D on 03/26/2015 at 1:42 PM  Between 7am to 6pm - Pager - [phone]  After 6pm go to [email or web address] - password [email or web address] [facility]  Office  [phone]  CC: Primary care physician; Murlean Iba, MD

## 2015-03-26 NOTE — ED Notes (Signed)
Patient transported to MRI 

## 2015-03-26 NOTE — ED Provider Notes (Addendum)
[facility] Emergency Department Provider Note  ____________________________________________  Time seen: 1210  I have reviewed the triage vital signs and the nursing notes.   HISTORY  Chief Complaint Code Stroke     HPI Toni Carlson is a 69 y.o. female with a history of renal failure, on dialysis, who began to have some slurred speech yesterday, according to her daughter who is present in the emergency department.  The patient went to dialysis morning where they noticed she was also having a problem drooling out of the right side of her mouth.  The patient reports she thought her slurred speech or speech problems were due to a cold she currently has. She denies any dysfunction in her arms and hands legs or feet. She is alert and communicative.  She denies having headache. She denies any chest pain or shortness of breath.   Past Medical History  Diagnosis Date  . Hypertension   . ESRD (end stage renal disease) ([place])     On Tue-Thur-Sat dialysis  . CHF (congestive heart failure) ([facility])     Diastolic heart failure  . Anemia   . Hyperparathyroidism, secondary renal [facility])     Patient Active Problem List   Diagnosis Date Noted  . CVA (cerebral infarction) 03/26/2015    Past Surgical History  Procedure Laterality Date  . Peripheral vascular catheterization N/A 02/23/2015    Procedure: A/V Shuntogram/Fistulagram;  Surgeon: Algernon Huxley, MD;  Location: [place] [facility];  Service: Cardiovascular;  Laterality: N/A;  . Peripheral vascular catheterization N/A 02/23/2015    Procedure: A/V Shunt Intervention;  Surgeon: Algernon Huxley, MD;  Location: [place] [facility];  Service: Cardiovascular;  Laterality: N/A;    Current Outpatient Rx  Name  Route  Sig  Dispense  Refill  . alendronate (FOSAMAX) 10 MG tablet   Oral   Take by mouth.         Marland Kitchen amLODipine (NORVASC) 10 MG tablet   Oral   Take 10 mg by mouth daily.         Marland Kitchen amLODipine  (NORVASC) 10 MG tablet   Oral   Take by mouth 2 (two) times daily.          . hydrALAZINE (APRESOLINE) 25 MG tablet   Oral   Take 25 mg by mouth 3 (three) times daily.         . hydrALAZINE (APRESOLINE) 25 MG tablet   Oral   Take by mouth.         . losartan (COZAAR) 100 MG tablet   Oral   Take 100 mg by mouth daily.         Marland Kitchen losartan (COZAAR) 100 MG tablet   Oral   Take by mouth.         Marland Kitchen NEXIUM 40 MG capsule   Oral   Take 40 mg by mouth 2 (two) times daily before a meal.            Dispense as written.   . SENSIPAR 90 MG tablet                 Dispense as written.   . sevelamer carbonate (RENVELA) 2.4 G PACK   Oral   Take by mouth.         . SEVELAMER HCL PO   Oral   Take by mouth. 2.4gm powder           Allergies Review of patient's allergies indicates no  known allergies.  No family history on file.  Social History Social History  Substance Use Topics  . Smoking status: Current Some Day Smoker -- 1.00 packs/day for 30 years  . Smokeless tobacco: Never Used  . Alcohol Use: No    Review of Systems  Constitutional: Negative for fatigue. ENT: Negative for congestion. Cardiovascular: Negative for chest pain. Respiratory: Negative for cough. Gastrointestinal: Negative for abdominal pain, vomiting and diarrhea. Genitourinary: Dialysis patient. Musculoskeletal: No myalgias or injuries. Skin: Negative for rash. Neurological: Slurred speech and drooling with facial weakness on the right. See history of present illness   10-point ROS otherwise negative.  ____________________________________________   PHYSICAL EXAM:  VITAL SIGNS: ED Triage Vitals  Enc Vitals Group     BP 03/26/15 1128 162/68 mmHg     Pulse Rate 03/26/15 1128 70     Resp 03/26/15 1128 18     Temp 03/26/15 1128 98.2 F (36.8 C)     Temp Source 03/26/15 1128 Oral     SpO2 03/26/15 1128 100 %     Weight 03/26/15 1128 150 lb (68.04 kg)     Height 03/26/15  1128 5\' 6"  (1.676 m)     Head Cir --      Peak Flow --      Pain Score --      Pain Loc --      Pain Edu? --      Excl. in Hurt? --     Constitutional:  Alert and communicative. Appears older than stated age. No acute distress. ENT   Head: Normocephalic and atraumatic.   Nose: Mild congestion noted.       Mouth: No erythema, no swelling. Asymmetry noted.   Cardiovascular: Normal rate, regular rhythm, no murmur noted Respiratory:  Normal respiratory effort, no tachypnea.    Breath sounds are clear and equal bilaterally.  Gastrointestinal: Soft and nontender. No distention.  Back: No muscle spasm, no tenderness, no CVA tenderness. Musculoskeletal: No deformity noted. Nontender with normal range of motion in all extremities.  No noted edema. Neurologic:  Communicative, alert. Facial asymmetry noted, with droop on the right. Extraocular m movement intact, tongue without deviation. Sensation intact. Grip strength feels a little bit less on the left hand, otherwise strength is normal in the upper extremities with no pronator drift and good finger to nose coordination. Patient has some discomfort and limitation with her legs with 4 over 5 strength bilaterally. Sensation intact. NIH score of 3. Skin:  Skin is warm, dry. No rash noted. Psychiatric: Mood and affect are normal. Speech and behavior are normal.  ____________________________________________    LABS (pertinent positives/negatives)  Labs Reviewed  CBC - Abnormal; Notable for the following:    Hemoglobin 11.5 (*)    MCHC 31.3 (*)    RDW 19.8 (*)    Platelets 146 (*)    All other components within normal limits  DIFFERENTIAL - Abnormal; Notable for the following:    Lymphs Abs 0.4 (*)    All other components within normal limits  COMPREHENSIVE METABOLIC PANEL - Abnormal; Notable for the following:    Potassium 3.2 (*)    Chloride 98 (*)    Glucose, Bld 100 (*)    BUN 30 (*)    Creatinine, Ser 4.88 (*)    Albumin 3.4  (*)    ALT 13 (*)    Alkaline Phosphatase 294 (*)    GFR calc non Af Amer 8 (*)    GFR calc Af Wyvonnia Lora  10 (*)    All other components within normal limits  TROPONIN I - Abnormal; Notable for the following:    Troponin I 0.14 (*)    All other components within normal limits  PROTIME-INR  APTT  TROPONIN I  TROPONIN I  TROPONIN I  CBG MONITORING, ED     ____________________________________________   EKG  ED ECG REPORT I, Tiarra Anastacio W, the attending physician, personally viewed and interpreted this ECG.   Date: 03/26/2015  EKG Time: 1147  Rate: 85  Rhythm: Atrial fibrillation  Axis: Normal  Intervals: Normal  ST&T Change: Downward T and 1, to, aVL, V5 and V6.   ____________________________________________    RADIOLOGY  IMPRESSION: Suspect early infarct at the right frontal -temporal junction. There is mild atrophy with patchy small vessel disease throughout the centra semiovale bilaterally. No hemorrhage.  Bony changes consistent with secondary hyperparathyroidism/chronic renal failure noted. Mild left-sided ethmoid sinus disease.  ____________________________________________   PROCEDURES  CRITICAL CARE Performed by: Ahmed Prima   Total critical care time: 30 minutes due to an acute CVA and the presence of end-stage renal disease. Discussed case with code stroke coordinator, internal medicine for admission, and family.  Critical care time was exclusive of separately billable procedures and treating other patients.  Critical care was necessary to treat or prevent imminent or life-threatening deterioration.  Critical care was time spent personally by me on the following activities: development of treatment plan with patient and/or surrogate as well as nursing, discussions with consultants, evaluation of patient's response to treatment, examination of patient, obtaining history from patient or surrogate, ordering and performing treatments and  interventions, ordering and review of laboratory studies, ordering and review of radiographic studies, pulse oximetry and re-evaluation of patient's condition.   ____________________________________________   INITIAL IMPRESSION / ASSESSMENT AND PLAN / ED COURSE  Pertinent labs & imaging results that were available during my care of the patient were reviewed by me and considered in my medical decision making (see chart for details).  Pleasant, alert, 3 your female who appears older than stated age with new facial weakness and slurred speech. This initially was noted yesterday with greater symptoms noted this morning at dialysis.  CT scan does show an area suspicious for an early infarct in the right frontotemporal junction.  We will treat the patient with aspirin and consult the hospitalist service for admission and ongoing care.  ----------------------------------------- 1:16 PM on 03/26/2015 -----------------------------------------  Potassium 3.2. Renal function abnormal with BUN of 30, creatinine 4.8, consistent with chronic renal disease. Troponin is slightly elevated at 0.14. It is unclear if this is due to a primary cardiac event or her renal dysfunction. Serial enzymes and further review to be performed as an inpatient.  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

## 2015-03-26 NOTE — Progress Notes (Signed)
*  PRELIMINARY RESULTS* Echocardiogram 2D Echocardiogram has been performed.  Toni Carlson 03/26/2015, 6:38 PM

## 2015-03-26 NOTE — ED Notes (Signed)
With pt to CT

## 2015-03-26 NOTE — ED Notes (Signed)
Pt off the floor unable to do repeat neuro check as ordered/.

## 2015-03-26 NOTE — Progress Notes (Signed)
[facility]  ROUNDING NOTE   Subjective:   Patient started having slurred speech yesterday afternoon. She thought she had a cold. However, patient went to dialysis. She completed her treatment when staff found her to have altered mental status and facial droop. Sent to the ED where CT and then MRI shows acute right MCA distribution CVA.   Objective:  Vital signs in last 24 hours:  Temp:  [97.9 F (36.6 C)-98.2 F (36.8 C)] 97.9 F (36.6 C) (11/10 1601) Pulse Rate:  [70-74] 74 (11/10 1601) Resp:  [18-20] 20 (11/10 1601) BP: (150-162)/(68-76) 150/76 mmHg (11/10 1601) SpO2:  [94 %-100 %] 94 % (11/10 1601) Weight:  [68.04 kg (150 lb)] 68.04 kg (150 lb) (11/10 1128)  Weight change:  Filed Weights   03/26/15 1128  Weight: 68.04 kg (150 lb)    Intake/Output:     Intake/Output this shift:     Physical Exam: General: NAD  Head: Normocephalic, atraumatic. Moist oral mucosal membranes  Eyes: Anicteric, PERRL  Neck: Supple, trachea midline  Lungs:  Clear to auscultation  Heart: Regular rate and rhythm  Abdomen:  Soft, nontender,   Extremities: no peripheral edema.  Neurologic: Left sided weakness, left sided facial droop  Skin: No lesions  Access: Left arm AVF    Basic Metabolic Panel:  Recent Labs Lab 03/26/15 1149  NA 141  K 3.2*  CL 98*  CO2 30  GLUCOSE 100*  BUN 30*  CREATININE 4.88*  CALCIUM 9.7    Liver Function Tests:  Recent Labs Lab 03/26/15 1149  AST 18  ALT 13*  ALKPHOS 294*  BILITOT 0.8  PROT 7.2  ALBUMIN 3.4*   No results for input(s): LIPASE, AMYLASE in the last 168 hours. No results for input(s): AMMONIA in the last 168 hours.  CBC:  Recent Labs Lab 03/26/15 1149  WBC 5.6  NEUTROABS 4.6  HGB 11.5*  HCT 36.5  MCV 89.2  PLT 146*    Cardiac Enzymes:  Recent Labs Lab 03/26/15 1149  TROPONINI 0.14*    BNP: Invalid input(s): POCBNP  CBG: No results for input(s): GLUCAP in the last 168  hours.  Microbiology: Results for orders placed or performed in visit on 09/19/11  MRSA PCR Screening     Status: None   Collection Time: 09/19/11 11:25 AM  Result Value Ref Range Status   Micro Text Report   Final       SOURCE: nasal    COMMENT                   NEGATIVE - MRSA target DNA not detected   ANTIBIOTIC                                                        Coagulation Studies:  Recent Labs  03/26/15 1149  LABPROT 14.8  INR 1.14    Urinalysis: No results for input(s): COLORURINE, LABSPEC, PHURINE, GLUCOSEU, HGBUR, BILIRUBINUR, KETONESUR, PROTEINUR, UROBILINOGEN, NITRITE, LEUKOCYTESUR in the last 72 hours.  Invalid input(s): APPERANCEUR    Imaging: Ct Head Wo Contrast  03/26/2015  CLINICAL DATA:  Acute onset left-sided paralysis and slurred speech. Chronic renal failure EXAM: CT HEAD WITHOUT CONTRAST TECHNIQUE: Contiguous axial images were obtained from the base of the skull through the vertex without intravenous contrast. COMPARISON:  None. FINDINGS: There  is mild diffuse atrophy. There is no intracranial mass, hemorrhage, extra-axial fluid collection, or midline shift. There is patchy small vessel disease throughout the centra semiovale bilaterally. There is a focal area of decreased attenuation in the right frontal -temporal junction, concerning for early acute infarct. Attenuation of the middle cerebral arteries is symmetric and normal bilaterally. The bony calvarium appears intact. The mastoid air cells are clear. There is carotid artery calcification in the siphon region bilaterally. There are bony changes in the calvarium consistent with secondary hyperparathyroidism from chronic renal failure. Mastoid air cells are clear bilaterally. There is opacification of a left-sided ethmoid air cell. IMPRESSION: Suspect early infarct at the right frontal -temporal junction. There is mild atrophy with patchy small vessel disease throughout the centra semiovale bilaterally.  No hemorrhage. Bony changes consistent with secondary hyperparathyroidism/chronic renal failure noted. Mild left-sided ethmoid sinus disease. Critical Value/emergent results were called by telephone at the time of interpretation on 03/26/2015 at 11:49 am to Dr. Meade Maw, ED physician , who verbally acknowledged these results. Electronically Signed   By: Lowella Grip III M.D.   On: 03/26/2015 11:51   Mr Virgel Paling [id number] Contrast  03/26/2015  ADDENDUM REPORT: 03/26/2015 14:41 ADDENDUM: Study discussed by telephone with Dr. Gladstone Lighter on 03/26/2015 at 1410 hours. Electronically Signed   By: Genevie Ann M.D.   On: 03/26/2015 14:41  03/26/2015  CLINICAL DATA:  68 year old female with left facial droop noted upon arrival to dialysis this morning. Symptoms not improved. Initial encounter. EXAM: MRI HEAD WITHOUT CONTRAST MRA HEAD WITHOUT CONTRAST TECHNIQUE: Multiplanar, multiecho pulse sequences of the brain and surrounding structures were obtained without intravenous contrast. Angiographic images of the head were obtained using MRA technique without contrast. COMPARISON:  Head CT without contrast 1142 hours today. FINDINGS: MRI HEAD FINDINGS 2-3 cm area of restricted diffusion in the right MCA territory affecting the insula and operculum primarily, but with additional scattered cortically based and subcortical white matter infarcts tracking cephalad into the right motor strip (series 100, image 38 and series 102, image 26). Associated T2 and FLAIR hyperintensity with no hemorrhage or mass effect. No contralateral or posterior fossa restricted diffusion. Major intracranial vascular flow voids are within normal limits. Superimposed bilateral patchy and confluent cerebral white matter T2 and FLAIR hyperintensity. No chronic cerebral blood products or cortical encephalomalacia. There is a small chronic lacunar infarct in the right thalamus. Brainstem and cerebellum within normal limits. No midline shift, mass  effect, evidence of mass lesion, ventriculomegaly, extra-axial collection or acute intracranial hemorrhage. Cervicomedullary junction and pituitary are within normal limits. Bilateral mastoid effusions. Negative nasopharynx. Trace paranasal sinus mucosal thickening. Rightward gaze deviation. Negative paranasal sinuses. Negative scalp soft tissues. Degenerative changes in the cervical spine. Decreased T1 bone marrow signal may be related to renal osteodystrophy. MRA HEAD FINDINGS Study is mildly degraded by motion artifact. Absent antegrade flow signal in the distal left vertebral artery. Antegrade flow in the right vertebral artery and basilar. No basilar stenosis. SCA and left PCA origins are patent. Fetal type right PCA origin. Diminutive or absent left posterior communicating artery. The right PCA appears occluded in the P2 segment. The left PCA is irregular but patent. Antegrade flow in both ICA siphons. No siphon stenosis. Infundibulum at the right ophthalmic artery origin suspected. Patent carotid termini. Patent MCA and ACA origins. 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 Motion artifact degrades detail of the  a 2 branches which appear patent. Left MCA M1 segment and bifurcation are patent. No left MCA branch occlusion identified. Right MCA M1 segment is patent. There is irregularity and high-grade stenosis at the right MCA bifurcation. Posterior sylvian divisions appear to remain patent. There is a flow gap at the origin of the dominant anterior division with reconstituted flow (series 107, image 12). IMPRESSION: 1. 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 Involvement of the insula, operculum, and lateral motor strip. 2. Evidence of emergent large vessel occlusion at the right MCA bifurcation, mostly affecting the dominant anterior M2 branch with reconstituted flow distally. 3. 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 4. 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 5. Moderate cerebral white matter signal changes and small chronic lacunar infarct in the right thalamus compatible with chronic small vessel disease. Electronically Signed: By: Genevie Ann M.D. On: 03/26/2015 14:04   Mr Brain Wo Contrast  03/26/2015  ADDENDUM REPORT: 03/26/2015 14:41 ADDENDUM: Study discussed by telephone with Dr. Gladstone Lighter on 03/26/2015 at 1410 hours. Electronically Signed   By: Genevie Ann M.D.   On: 03/26/2015 14:41  03/26/2015  CLINICAL DATA:  69 year old female with left facial droop noted upon arrival to dialysis this morning. Symptoms not improved. Initial encounter. EXAM: MRI HEAD WITHOUT CONTRAST MRA HEAD WITHOUT CONTRAST TECHNIQUE: Multiplanar, multiecho pulse sequences of the brain and surrounding structures were obtained without intravenous contrast. Angiographic images of the head were obtained using MRA technique without contrast. COMPARISON:  Head CT without contrast 1142 hours today. FINDINGS: MRI HEAD FINDINGS 2-3 cm area of restricted diffusion in the right MCA territory affecting the insula and operculum primarily, but with additional scattered cortically based and subcortical white matter infarcts tracking cephalad into the right motor strip (series 100, image 38 and series 102, image 26). Associated T2 and FLAIR hyperintensity with no hemorrhage or mass effect. No contralateral or posterior fossa restricted diffusion. Major intracranial vascular flow voids are within normal limits. Superimposed bilateral patchy and confluent cerebral white matter T2 and FLAIR hyperintensity. No chronic cerebral blood products or cortical encephalomalacia. There is a small chronic lacunar infarct in the right thalamus. Brainstem and cerebellum within  normal limits. No midline shift, mass effect, evidence of mass lesion, ventriculomegaly, extra-axial collection or acute intracranial hemorrhage. Cervicomedullary junction and pituitary are within normal limits. Bilateral mastoid effusions. Negative nasopharynx. Trace paranasal sinus mucosal thickening. Rightward gaze deviation. Negative paranasal sinuses. Negative scalp soft tissues. Degenerative changes in the cervical spine. Decreased T1 bone marrow signal may be related to renal osteodystrophy. MRA HEAD FINDINGS Study is mildly degraded by motion artifact. Absent antegrade flow signal in the distal left vertebral artery. Antegrade flow in the right vertebral artery and basilar. No basilar stenosis. SCA and left PCA origins are patent. Fetal type right PCA origin. Diminutive or absent left posterior communicating artery. The right PCA appears occluded in the P2 segment. The left PCA is irregular but patent. Antegrade flow in both ICA siphons. No siphon stenosis. Infundibulum at the right ophthalmic artery origin suspected. Patent carotid termini. Patent MCA and ACA origins. 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 Motion artifact degrades detail of the a 2 branches which appear patent. Left MCA M1 segment and bifurcation are patent.  No left MCA branch occlusion identified. Right MCA M1 segment is patent. There is irregularity and high-grade stenosis at the right MCA bifurcation. Posterior sylvian divisions appear to remain patent. There is a flow gap at the origin of the dominant anterior division with reconstituted flow (series 107, image 12). IMPRESSION: 1. 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 Involvement of the insula, operculum, and lateral motor strip. 2. Evidence of emergent large vessel occlusion at the right MCA bifurcation, mostly affecting the dominant anterior M2 branch with  reconstituted flow distally. 3. 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 4. 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 5. Moderate cerebral white matter signal changes and small chronic lacunar infarct in the right thalamus compatible with chronic small vessel disease. Electronically Signed: By: Genevie Ann M.D. On: 03/26/2015 14:04   US Carotid Bilateral  03/26/2015  CLINICAL DATA:  Cerebral infarction, personal history of hypertension, CHF, end-stage renal disease, smoking EXAM: BILATERAL CAROTID DUPLEX ULTRASOUND TECHNIQUE: Pearline Cables scale imaging, color Doppler and duplex ultrasound were performed of bilateral carotid and vertebral arteries in the neck. COMPARISON:  None FINDINGS: Criteria: Quantification of carotid stenosis is based on velocity parameters that correlate the residual internal carotid diameter with NASCET-based stenosis levels, using the diameter of the distal internal carotid lumen as the denominator for stenosis measurement. The following velocity measurements were obtained: RIGHT ICA:  167/43 cm/sec CCA:  0000000 cm/sec SYSTOLIC ICA/CCA RATIO:  [id number] DIASTOLIC ICA/CCA RATIO:  [id number] ECA:  77 cm/sec LEFT ICA:  120/32 cm/sec CCA:  [id number] cm/sec SYSTOLIC ICA/CCA RATIO:  [id number] DIASTOLIC ICA/CCA RATIO:  [id number] ECA:  144 cm/sec RIGHT CAROTID ARTERY: Intimal thickening RIGHT CCA. Small echogenic non shadowing plaque RIGHT carotid bulb. Additional shadowing plaque at distal carotid bulb. Laminar flow on color Doppler imaging. Spectral broadening RIGHT ICA on waveform analysis. No discrete high velocity jet. Mildly tortuous RIGHT carotid system. RIGHT VERTEBRAL ARTERY:  Patent, antegrade LEFT CAROTID ARTERY: Mildly tortuous LEFT carotid system. Intimal thickening LEFT CCA. Echogenic shadowing and non shadowing plaque LEFT carotid bulb extending in the  proximal LEFT ICA. Turbulent flow on color Doppler imaging within LEFT ICA. Spectral broadening LEFT ICA on waveform analysis. No high velocity jets. LEFT VERTEBRAL ARTERY:  Patent, antegrade IMPRESSION: Plaque formation at the carotid bifurcations bilaterally associated with turbulent flow at the proximal LEFT ICA. Velocity measurements correspond to a 50-69% diameter stenosis of the RIGHT carotid and a less than 50% diameter stenosis of the LEFT carotid. Electronically Signed   By: Lavonia Dana M.D.   On: 03/26/2015 15:20     Medications:     . [START ON 03/27/2015] amLODipine  10 mg Oral Daily  . aspirin EC  81 mg Oral Daily  . atorvastatin  40 mg Oral q1800  . docusate sodium  100 mg Oral BID  . heparin  5,000 Units Subcutaneous 3 times per day  . [START ON 03/27/2015] hydrALAZINE  25 mg Oral q morning - 10a   And  . hydrALAZINE  50 mg Oral QPM  . losartan  100 mg Oral Daily  . pantoprazole  40 mg Oral BID  . sevelamer  400 mg Oral TID WC  . sodium chloride  3 mL Intravenous Q12H   acetaminophen **OR** acetaminophen, ondansetron **OR** ondansetron (ZOFRAN) IV, polyethylene glycol  Assessment/ Plan:  Toni Carlson is  a 69 y.o. black female with hypertension, GERD, ESRD  [facility] TTS [place]  1. ESRD: completed hemodialysis treatment earlier today. No acute indication for dailysis.  Plan to continue TTS schedule.   2. Acute CVA: Right MCA infarction. MRI and CT images reviewed - Aspirin, does not seem she was taking this at home.   3. Anemia of chronic kidney disease: hemoglobin 11.5 on admission - with acute ischemic event, will hold epo.   4. Secondary Hyperparathyroidism: PTH 4769. Does not tolerate cinacalcet - on high dose hectorol as outpatient.  - phosphorus at goal, 4.3 - restart calcium acetate  5. Hypertension: well controlled. Allow blood pressure to stay slightly elevated to help with perfusion.  - amlodipine, hydralazine and losartan.    LOS:  0 Cheri Ayotte 11/10/20164:40 PM

## 2015-03-26 NOTE — Progress Notes (Signed)
Dr. Tressia Miners notified of pts afib at this time. MD ordering EKG

## 2015-03-26 NOTE — ED Notes (Signed)
Pt to MRI via wheelchair.

## 2015-03-26 NOTE — ED Notes (Signed)
Pt daughter reports that she may have noticed slurred speech yesterday. Pt reports that she was at Dialysis and sent to ER for evaluation. Sent to dialysis at 0530 this AM, daughter reports that she was told facial droop was present at time of dialysis, not baseline for patient. LNW 03/26/15.

## 2015-03-27 DIAGNOSIS — I63411 Cerebral infarction due to embolism of right middle cerebral artery: Principal | ICD-10-CM

## 2015-03-27 DIAGNOSIS — I4891 Unspecified atrial fibrillation: Secondary | ICD-10-CM

## 2015-03-27 LAB — CBC
HEMATOCRIT: 34.7 % — AB (ref 35.0–47.0)
Hemoglobin: 10.7 g/dL — ABNORMAL LOW (ref 12.0–16.0)
MCH: 27.7 pg (ref 26.0–34.0)
MCHC: 30.9 g/dL — AB (ref 32.0–36.0)
MCV: 89.9 fL (ref 80.0–100.0)
Platelets: 127 10*3/uL — ABNORMAL LOW (ref 150–440)
RBC: 3.86 MIL/uL (ref 3.80–5.20)
RDW: 19.8 % — AB (ref 11.5–14.5)
WBC: 4.4 10*3/uL (ref 3.6–11.0)

## 2015-03-27 LAB — BASIC METABOLIC PANEL
Anion gap: 15 (ref 5–15)
BUN: 47 mg/dL — AB (ref 6–20)
CHLORIDE: 98 mmol/L — AB (ref 101–111)
CO2: 25 mmol/L (ref 22–32)
Calcium: 9.7 mg/dL (ref 8.9–10.3)
Creatinine, Ser: 6.29 mg/dL — ABNORMAL HIGH (ref 0.44–1.00)
GFR calc Af Amer: 7 mL/min — ABNORMAL LOW (ref >60)
GFR calc non Af Amer: 6 mL/min — ABNORMAL LOW (ref >60)
GLUCOSE: 99 mg/dL (ref 65–99)
POTASSIUM: 3.5 mmol/L (ref 3.5–5.1)
Sodium: 138 mmol/L (ref 135–145)

## 2015-03-27 LAB — HEMOGLOBIN A1C: Hgb A1c MFr Bld: 5.6 % (ref 4.0–6.0)

## 2015-03-27 LAB — PROTIME-INR
INR: 1.17
Prothrombin Time: 15.1 seconds — ABNORMAL HIGH (ref 11.4–15.0)

## 2015-03-27 LAB — TROPONIN I: Troponin I: 0.14 ng/mL — ABNORMAL HIGH (ref <0.031)

## 2015-03-27 LAB — APTT: APTT: 35 s (ref 24–36)

## 2015-03-27 MED ORDER — HEPARIN BOLUS VIA INFUSION
4000.0000 [IU] | Freq: Once | INTRAVENOUS | Status: AC
  Administered 2015-03-27: 4000 [IU] via INTRAVENOUS
  Filled 2015-03-27: qty 4000

## 2015-03-27 MED ORDER — WARFARIN - PHARMACIST DOSING INPATIENT
Freq: Every day | Status: DC
  Administered 2015-03-27 – 2015-03-31 (×5)

## 2015-03-27 MED ORDER — WARFARIN SODIUM 5 MG PO TABS
5.0000 mg | ORAL_TABLET | Freq: Every day | ORAL | Status: DC
  Administered 2015-03-27 – 2015-03-29 (×3): 5 mg via ORAL
  Filled 2015-03-27 (×3): qty 1

## 2015-03-27 MED ORDER — HEPARIN (PORCINE) IN NACL 100-0.45 UNIT/ML-% IJ SOLN
1550.0000 [IU]/h | INTRAMUSCULAR | Status: DC
  Administered 2015-03-27: 1000 [IU]/h via INTRAVENOUS
  Administered 2015-03-28: 1250 [IU]/h via INTRAVENOUS
  Administered 2015-03-29 – 2015-03-31 (×3): 1400 [IU]/h via INTRAVENOUS
  Filled 2015-03-27 (×12): qty 250

## 2015-03-27 MED ORDER — ASPIRIN EC 325 MG PO TBEC
325.0000 mg | DELAYED_RELEASE_TABLET | Freq: Every day | ORAL | Status: DC
  Administered 2015-03-28 – 2015-03-29 (×2): 325 mg via ORAL
  Filled 2015-03-27 (×2): qty 1

## 2015-03-27 MED ORDER — HYDRALAZINE HCL 50 MG PO TABS
50.0000 mg | ORAL_TABLET | Freq: Three times a day (TID) | ORAL | Status: DC
  Administered 2015-03-27 – 2015-04-01 (×15): 50 mg via ORAL
  Filled 2015-03-27 (×15): qty 1

## 2015-03-27 MED ORDER — CARVEDILOL 3.125 MG PO TABS
3.1250 mg | ORAL_TABLET | Freq: Two times a day (BID) | ORAL | Status: DC
  Administered 2015-03-27 – 2015-03-29 (×4): 3.125 mg via ORAL
  Filled 2015-03-27 (×4): qty 1

## 2015-03-27 MED ORDER — TROLAMINE SALICYLATE 10 % EX CREA
TOPICAL_CREAM | Freq: Two times a day (BID) | CUTANEOUS | Status: DC | PRN
  Administered 2015-03-27: 1 via TOPICAL
  Filled 2015-03-27: qty 85

## 2015-03-27 MED ORDER — APIXABAN 5 MG PO TABS: 5.0000 mg | ORAL_TABLET | Freq: Two times a day (BID) | ORAL | Status: DC

## 2015-03-27 NOTE — Consult Note (Signed)
Cardiology Consultation Note  Patient ID: Toni Carlson, MRN: [id number], DOB/AGE: [birth year] 69 y.o. Admit date: 03/26/2015   Date of Consult: 03/27/2015 Primary Physician: Murlean Iba, MD Primary Cardiologist: New to [facility], previously saw Dr. Chancy Milroy, MD in [facility] (never as an outpatient)  Chief Complaint: Slurred speech Reason for Consult: New onset Afib  HPI: 69 y.o. 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     She has been dealing with a cold for the past several days. Upon her arrival to HD on 11/10 she knew her speech was different she reports, but felt like this was 2/2 the above cold. However, her speech continued to worsen and she developed left-sided facial droop. This prompted dialysis to send the patient to the ED for further evaluation geiven concerns for possible stroke. She never had any focal weakness along her extremities or paraesthesias. No changes in her vision. No palpitations, chest pain, SOB, nausea, vomiting, presyncope, or syncope.     Upon the patient's arrival to [facility] they were found to have CT head that suspected early infarct at the right frontal-temporal junction. No hemorrhage. MRI brain showed acute right MCA infarct without hemorrhage or mass effect. Large vessel occlusion at the right MCA bifurcation, mostly affecting the dominant anterior M2 branch with reconstituted flow distally. 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 Carotid ultrasound showed 50-69% stenosis of the right carotid and less than [id number] of the LICA. She was outside the window for TPA, thus this was not administered. She had  flat trending troponins 0.14  x 3, K+ 3.2-->3.5, hgb 11.5-->10.7, LDL 107. ECG showed Afib, 85 bpm, occasional PVC, inferolateral TWI, CXR was not performed. She has remained in rate controlled Afib. She did received one dose of Eliquis this morning. She continues to have residual left-sided facial droop.      Past Medical History  Diagnosis Date  . Hypertension   . ESRD (end stage renal disease) ([place])     On Tue-Thur-Sat dialysis  . CHF (congestive heart failure) (HCC)     Diastolic heart failure  . Anemia   . Hyperparathyroidism, secondary renal ([place])       Most Recent Cardiac Studies: Cardiac cath 2013: Moderate left circumflex and mid RCA disease.  Echo 2013: 2-D echocardiogram on 08/23/2011 showed normal chamber size and function with moderate left ventricular hypertrophy. Mild mitral regurgitation. Mild to moderate tricuspid regurgitation with moderate pulmonary hypertension. Ejection fraction of more than 55%.   Surgical History:  Past Surgical History  Procedure Laterality Date  . Peripheral vascular catheterization N/A 02/23/2015    Procedure: A/V Shuntogram/Fistulagram;  Surgeon: Algernon Huxley, MD;  Location: [place] [facility];  Service: Cardiovascular;  Laterality: N/A;  . Peripheral vascular catheterization N/A 02/23/2015    Procedure: A/V Shunt Intervention;  Surgeon: Algernon Huxley, MD;  Location: [place] [facility];  Service: Cardiovascular;  Laterality: N/A;     Home Meds: Prior to Admission medications   Medication Sig Start Date End Date Taking? Authorizing Provider  amLODipine (NORVASC) 10 MG tablet Take 10 mg by mouth daily.   Yes Historical Provider, MD  hydrALAZINE (APRESOLINE) 25 MG tablet  Take 25 mg by mouth 3 (three) times daily. 1 qam and 2 qpm   Yes Historical Provider, MD  losartan (COZAAR) 100 MG tablet Take 100 mg by mouth daily.   Yes Historical Provider, MD  NEXIUM 40 MG capsule Take 40 mg by mouth daily at 12 noon.  09/11/14  Yes Historical Provider, MD  [id number] MG/5ML  SOLN Take 1,334 mg by mouth 3 (three) times daily with meals.  03/26/15  Yes Historical Provider, MD    Inpatient Medications:  . amLODipine  10 mg Oral Daily  . apixaban  5 mg Oral BID  . [START ON 03/28/2015] aspirin EC  325 mg Oral Daily  . atorvastatin  40 mg Oral q1800  . calcium acetate (Phos Binder)  2,668 mg Oral TID WC  . carvedilol  3.125 mg Oral BID WC  . docusate sodium  100 mg Oral BID  . hydrALAZINE  25 mg Oral q morning - 10a   And  . hydrALAZINE  50 mg Oral QPM  . losartan  100 mg Oral Daily  . pantoprazole  40 mg Oral BID  . sodium chloride  3 mL Intravenous Q12H      Allergies: No Known Allergies  Social History   Social History  . Marital Status: Single    Spouse Name: N/A  . Number of Children: N/A  . Years of Education: N/A   Occupational History  . Not on file.   Social History Main Topics  . Smoking status: Current Some Day Smoker -- 0.25 packs/day for 30 years    Types: Cigarettes  . Smokeless tobacco: Never Used  . Alcohol Use: No  . Drug Use: No  . Sexual Activity: Not on file   Other Topics Concern  . Not on file   Social History Narrative   Lives at home independently. Has a cane for ambulation.     Family History  Problem Relation Age of Onset  . CAD Father   . Dementia Mother      Review of Systems: Review of Systems  Constitutional: Positive for malaise/fatigue. Negative for fever, chills, weight loss and diaphoresis.  HENT: Negative for congestion.   Eyes: Negative for discharge and redness.  Respiratory: Negative for cough, hemoptysis, sputum production, shortness of breath and wheezing.   Cardiovascular: Negative for chest pain, palpitations, orthopnea, claudication, leg swelling and PND.  Gastrointestinal: Negative for heartburn, nausea, vomiting, abdominal pain, blood in stool and melena.  Genitourinary: Negative for hematuria.  Musculoskeletal: Positive for falls. Negative for myalgias.       Falls x 2 within the  past 12 months  Skin: Negative for rash.  Neurological: Positive for weakness. Negative for dizziness, tingling, tremors, sensory change, speech change, focal weakness, seizures and loss of consciousness.       Slurred speech   Endo/Heme/Allergies: Does not bruise/bleed easily.  Psychiatric/Behavioral: Negative for substance abuse. The patient is not nervous/anxious.   All other systems reviewed and are negative.    Labs:  Recent Labs  03/26/15 1149 03/26/15 1746 03/26/15 2330  TROPONINI 0.14* 0.14* 0.14*   Lab Results  Component Value Date   WBC 4.4 03/27/2015   HGB 10.7* 03/27/2015   HCT 34.7* 03/27/2015   MCV 89.9 03/27/2015   PLT 127* 03/27/2015    Recent Labs Lab 03/26/15 1149 03/27/15 0534  NA 141 138  K 3.2* 3.5  CL 98* 98*  CO2 30 25  BUN 30* 47*  CREATININE 4.88* 6.29*  CALCIUM 9.7  9.7  PROT 7.2  --   BILITOT 0.8  --   ALKPHOS 294*  --   ALT 13*  --   AST 18  --   GLUCOSE 100* 99   Lab Results  Component Value Date   CHOL 171 03/26/2015   HDL 48 03/26/2015   LDLCALC 107* 03/26/2015   TRIG 79 03/26/2015   No results found for: DDIMER  Radiology/Studies:  Ct Head Wo Contrast  03/26/2015  CLINICAL DATA:  Acute onset left-sided paralysis and slurred speech. Chronic renal failure EXAM: CT HEAD WITHOUT CONTRAST TECHNIQUE: Contiguous axial images were obtained from the base of the skull through the vertex without intravenous contrast. COMPARISON:  None. FINDINGS: There is mild diffuse atrophy. There is no intracranial mass, hemorrhage, extra-axial fluid collection, or midline shift. There is patchy small vessel disease throughout the centra semiovale bilaterally. There is a focal area of decreased attenuation in the right frontal -temporal junction, concerning for early acute infarct. Attenuation of the middle cerebral arteries is symmetric and normal bilaterally. The bony calvarium appears intact. The mastoid air cells are clear. There is carotid artery  calcification in the siphon region bilaterally. There are bony changes in the calvarium consistent with secondary hyperparathyroidism from chronic renal failure. Mastoid air cells are clear bilaterally. There is opacification of a left-sided ethmoid air cell. IMPRESSION: Suspect early infarct at the right frontal -temporal junction. There is mild atrophy with patchy small vessel disease throughout the centra semiovale bilaterally. No hemorrhage. Bony changes consistent with secondary hyperparathyroidism/chronic renal failure noted. Mild left-sided ethmoid sinus disease. Critical Value/emergent results were called by telephone at the time of interpretation on 03/26/2015 at 11:49 am to Dr. Meade Maw, ED physician , who verbally acknowledged these results. Electronically Signed   By: Lowella Grip III M.D.   On: 03/26/2015 11:51   Mr Virgel Paling [id number] Contrast  03/26/2015  ADDENDUM REPORT: 03/26/2015 14:41 ADDENDUM: Study discussed by telephone with Dr. Gladstone Lighter on 03/26/2015 at 1410 hours. Electronically Signed   By: Genevie Ann M.D.   On: 03/26/2015 14:41  03/26/2015  CLINICAL DATA:  69 year old female with left facial droop noted upon arrival to dialysis this morning. Symptoms not improved. Initial encounter. EXAM: MRI HEAD WITHOUT CONTRAST MRA HEAD WITHOUT CONTRAST TECHNIQUE: Multiplanar, multiecho pulse sequences of the brain and surrounding structures were obtained without intravenous contrast. Angiographic images of the head were obtained using MRA technique without contrast. COMPARISON:  Head CT without contrast 1142 hours today. FINDINGS: MRI HEAD FINDINGS 2-3 cm area of restricted diffusion in the right MCA territory affecting the insula and operculum primarily, but with additional scattered cortically based and subcortical white matter infarcts tracking cephalad into the right motor strip (series 100, image 38 and series 102, image 26). Associated T2 and FLAIR hyperintensity with no hemorrhage  or mass effect. No contralateral or posterior fossa restricted diffusion. Major intracranial vascular flow voids are within normal limits. Superimposed bilateral patchy and confluent cerebral white matter T2 and FLAIR hyperintensity. No chronic cerebral blood products or cortical encephalomalacia. There is a small chronic lacunar infarct in the right thalamus. Brainstem and cerebellum within normal limits. No midline shift, mass effect, evidence of mass lesion, ventriculomegaly, extra-axial collection or acute intracranial hemorrhage. Cervicomedullary junction and pituitary are within normal limits. Bilateral mastoid effusions. Negative nasopharynx. Trace paranasal sinus mucosal thickening. Rightward gaze deviation. Negative paranasal sinuses. Negative scalp soft tissues. Degenerative changes in the cervical spine. Decreased T1 bone marrow signal may be related to renal osteodystrophy.  MRA HEAD FINDINGS Study is mildly degraded by motion artifact. Absent antegrade flow signal in the distal left vertebral artery. Antegrade flow in the right vertebral artery and basilar. No basilar stenosis. SCA and left PCA origins are patent. Fetal type right PCA origin. Diminutive or absent left posterior communicating artery. The right PCA appears occluded in the P2 segment. The left PCA is irregular but patent. Antegrade flow in both ICA siphons. No siphon stenosis. Infundibulum at the right ophthalmic artery origin suspected. Patent carotid termini. Patent MCA and ACA origins. 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 Motion artifact degrades detail of the a 2 branches which appear patent. Left MCA M1 segment and bifurcation are patent. No left MCA branch occlusion identified. Right MCA M1 segment is patent. There is irregularity and high-grade stenosis at the right MCA bifurcation. Posterior sylvian divisions appear to remain patent.  There is a flow gap at the origin of the dominant anterior division with reconstituted flow (series 107, image 12). IMPRESSION: 1. 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 Involvement of the insula, operculum, and lateral motor strip. 2. Evidence of emergent large vessel occlusion at the right MCA bifurcation, mostly affecting the dominant anterior M2 branch with reconstituted flow distally. 3. 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 4. 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 5. Moderate cerebral white matter signal changes and small chronic lacunar infarct in the right thalamus compatible with chronic small vessel disease. Electronically Signed: By: Genevie Ann M.D. On: 03/26/2015 14:04   Mr Brain Wo Contrast  03/26/2015  ADDENDUM REPORT: 03/26/2015 14:41 ADDENDUM: Study discussed by telephone with Dr. Gladstone Lighter on 03/26/2015 at 1410 hours. Electronically Signed   By: Genevie Ann M.D.   On: 03/26/2015 14:41  03/26/2015  CLINICAL DATA:  69 year old female with left facial droop noted upon arrival to dialysis this morning. Symptoms not improved. Initial encounter. EXAM: MRI HEAD WITHOUT CONTRAST MRA HEAD WITHOUT CONTRAST TECHNIQUE: Multiplanar, multiecho pulse sequences of the brain and surrounding structures were obtained without intravenous contrast. Angiographic images of the head were obtained using MRA technique without contrast. COMPARISON:  Head CT without contrast 1142 hours today. FINDINGS: MRI HEAD FINDINGS 2-3 cm area of restricted diffusion in the right MCA territory affecting the insula and operculum primarily, but with additional scattered cortically based and subcortical white matter infarcts tracking cephalad into the right motor strip (series 100, image 38 and series 102, image 26). Associated T2 and FLAIR  hyperintensity with no hemorrhage or mass effect. No contralateral or posterior fossa restricted diffusion. Major intracranial vascular flow voids are within normal limits. Superimposed bilateral patchy and confluent cerebral white matter T2 and FLAIR hyperintensity. No chronic cerebral blood products or cortical encephalomalacia. There is a small chronic lacunar infarct in the right thalamus. Brainstem and cerebellum within normal limits. No midline shift, mass effect, evidence of mass lesion, ventriculomegaly, extra-axial collection or acute intracranial hemorrhage. Cervicomedullary junction and pituitary are within normal limits. Bilateral mastoid effusions. Negative nasopharynx. Trace paranasal sinus mucosal thickening. Rightward gaze deviation. Negative paranasal sinuses. Negative scalp soft tissues. Degenerative changes in the cervical spine. Decreased T1 bone marrow signal may be related to renal osteodystrophy. MRA HEAD FINDINGS Study is mildly degraded by motion artifact. Absent antegrade flow signal  in the distal left vertebral artery. Antegrade flow in the right vertebral artery and basilar. No basilar stenosis. SCA and left PCA origins are patent. Fetal type right PCA origin. Diminutive or absent left posterior communicating artery. The right PCA appears occluded in the P2 segment. The left PCA is irregular but patent. Antegrade flow in both ICA siphons. No siphon stenosis. Infundibulum at the right ophthalmic artery origin suspected. Patent carotid termini. Patent MCA and ACA origins. 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 Motion artifact degrades detail of the a 2 branches which appear patent. Left MCA M1 segment and bifurcation are patent. No left MCA branch occlusion identified. Right MCA M1 segment is patent. There is irregularity and high-grade stenosis at the right MCA bifurcation. Posterior sylvian  divisions appear to remain patent. There is a flow gap at the origin of the dominant anterior division with reconstituted flow (series 107, image 12). IMPRESSION: 1. 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 Involvement of the insula, operculum, and lateral motor strip. 2. Evidence of emergent large vessel occlusion at the right MCA bifurcation, mostly affecting the dominant anterior M2 branch with reconstituted flow distally. 3. 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 4. 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 5. Moderate cerebral white matter signal changes and small chronic lacunar infarct in the right thalamus compatible with chronic small vessel disease. Electronically Signed: By: Genevie Ann M.D. On: 03/26/2015 14:04   US Carotid Bilateral  03/26/2015  CLINICAL DATA:  Cerebral infarction, personal history of hypertension, CHF, end-stage renal disease, smoking EXAM: BILATERAL CAROTID DUPLEX ULTRASOUND TECHNIQUE: Pearline Cables scale imaging, color Doppler and duplex ultrasound were performed of bilateral carotid and vertebral arteries in the neck. COMPARISON:  None FINDINGS: Criteria: Quantification of carotid stenosis is based on velocity parameters that correlate the residual internal carotid diameter with NASCET-based stenosis levels, using the diameter of the distal internal carotid lumen as the denominator for stenosis measurement. The following velocity measurements were obtained: RIGHT ICA:  167/43 cm/sec CCA:  0000000 cm/sec SYSTOLIC ICA/CCA RATIO:  [id number] DIASTOLIC ICA/CCA RATIO:  [id number] ECA:  77 cm/sec LEFT ICA:  120/32 cm/sec CCA:  [id number] cm/sec SYSTOLIC ICA/CCA RATIO:  [id number] DIASTOLIC ICA/CCA RATIO:  [id number] ECA:  144 cm/sec RIGHT CAROTID ARTERY: Intimal thickening RIGHT CCA. Small echogenic non shadowing plaque RIGHT carotid bulb.  Additional shadowing plaque at distal carotid bulb. Laminar flow on color Doppler imaging. Spectral broadening RIGHT ICA on waveform analysis. No discrete high velocity jet. Mildly tortuous RIGHT carotid system. RIGHT VERTEBRAL ARTERY:  Patent, antegrade LEFT CAROTID ARTERY: Mildly tortuous LEFT carotid system. Intimal thickening LEFT CCA. Echogenic shadowing and non shadowing plaque LEFT carotid bulb extending in the proximal LEFT ICA. Turbulent flow on color Doppler imaging within LEFT ICA. Spectral broadening LEFT ICA on waveform analysis. No high velocity jets. LEFT VERTEBRAL ARTERY:  Patent, antegrade IMPRESSION: Plaque formation at the carotid bifurcations bilaterally associated with turbulent flow at the proximal LEFT ICA. Velocity measurements correspond to a 50-69% diameter stenosis of the RIGHT carotid and a less than 50% diameter stenosis of the LEFT carotid. Electronically Signed   By: Lavonia Dana M.D.   On: 03/26/2015 15:20    EKG: Afib, 85 bpm, occasional PVC, inferolateral TWI  Weights: [employer]  03/26/15 1128  Weight: 150 lb (68.04 kg)     Physical Exam: Blood pressure 169/73, pulse 77, temperature 98.5 F (36.9 C), temperature source Axillary, resp. rate 20, height 5\' 6"  (1.676 m), weight 150 lb (68.04 kg), SpO2 100 %. Body mass index is 24.22 kg/(m^2). General: Well developed, well nourished, in no acute distress. Head: Normocephalic, atraumatic, sclera non-icteric, no xanthomas, nares are without discharge. Left-sided facial droop.   Neck: Negative for carotid bruits. JVD not elevated. Lungs: Clear bilaterally to auscultation without wheezes, rales, or rhonchi. Breathing is unlabored. Heart: Irregularly-irregular, with S1 S2. No murmurs, rubs, or gallops appreciated. Abdomen: Soft, non-tender, non-distended with normoactive bowel sounds. No hepatomegaly. No rebound/guarding. No obvious abdominal masses. Msk:  Strength and tone appear normal for age. Extremities: No  clubbing or cyanosis. No edema.  Distal pedal pulses are 2+ and equal bilaterally. Neuro: Alert and oriented X 3. No facial asymmetry. No focal deficit. Moves all extremities spontaneously. Psych:  Responds to questions appropriately with a normal affect.    Assessment and Plan:   1. Acute embolic stroke: -Recommend neurology consult  -Neurology to determine when patient can stroke Clos term full dose anticoagulation  -PT/OT -Add A1C for risk stratification -She presented outside thte window for TPA, thus this was not given   2. New onset Afib with RVR: -Rate controlled -Patient with a history of symptomatic bradycardia that required short term temp wire pacing in 2013 -Would be cautious with beta blockers/CCB -Coreg 3.125 mg bid -Brostrom term full dose anticoagulation to be started as above -CHADSVASc at least 6 (HTN, age x 1, stroke, vascular disease, female) giving her an estimate annual stroke risk of 9.8% -Echo is pending to evaluate LV function and wall motion   3. Mildly elevated troponin/nonobstructive CAD:  -Likely elevated in the setting of her acute stroke -No anginal symptoms -Echo is pending as above -If she develops chest pain could pursue outpatient stress test at later date, though would not at this time given her acute stroke and lack of symptoms   4. HTN: -Poorly controlled -Continue amlodipine 10 mg, Coreg 3.125 mg bid (Heart rate precludes further titration), losartan 100 mg daily -Change hydralazine to 50 mg tid  5. ESRD on HD TTS: -Per Renal  5. HLD: -Continue Lipitor 40 mg  6. Anemia of chronic disease: -Stable   Signed, Christell Faith, PA-C Pager: [phone] 03/27/2015, 11:10 AM

## 2015-03-27 NOTE — Discharge Instructions (Signed)
Heart healthy and renal diet. Activity as tolerated. HHPT

## 2015-03-27 NOTE — Progress Notes (Signed)
PT Cancellation Note  Patient Details Name: Toni Carlson MRN: [id number] DOB: Oct 25, 1945   Cancelled Treatment:    Reason Eval/Treat Not Completed: Other (comment) (See PT note for further details) Per chart review pt with pending vascular surgery and neurology consults, as well as elevated troponin. Will hold PT consult until further medical input. Will attempt at later time/date.   Janyth Contes 03/27/2015, 8:41 AM  Janyth Contes, SPT. [phone]

## 2015-03-27 NOTE — Care Management (Signed)
Admitted to this facility with the diagnosis of CVA. Lives alone in a mobile home.  Daughter is Arts development officer. The telephone number listed is [phone]) Ms. Comella says this is not the correct telephone number for her daughter. Daughter will be visiting around 2:pm today. Goes to [facility] on [address] x 5 years. [place] transport. Takes care of ll basic activities of daily living herself, doesn't drive. States  That Dr. Candiss Norse has arranged home health for her, but no one comes at present. Uses a cane to aid in ambulation. Would like a shower chair, bedside commode, and rolling walker when discharged.  States that the only doctor she sees is Dr. Candiss Norse.  Shelbie Ammons RN MSN CCM Care Management [phone]

## 2015-03-27 NOTE — Progress Notes (Signed)
ANTICOAGULATION CONSULT NOTE - Initial Consult  Pharmacy Consult for heparin/coumadin Indication: atrial fibrillation  No Known Allergies  Patient Measurements: Height: 5\' 6"  (167.6 cm) Weight: 150 lb (68.04 kg) IBW/kg (Calculated) : 59.3 Heparin Dosing Weight: 68kg  Vital Signs: Temp: 97.7 F (36.5 C) (11/11 1256) Temp Source: Oral (11/11 1256) BP: 128/54 mmHg (11/11 1256) Pulse Rate: 67 (11/11 1256)  Labs:  Recent Labs  03/26/15 1149 03/26/15 1746 03/26/15 2330 03/27/15 0534  HGB 11.5*  --   --  10.7*  HCT 36.5  --   --  34.7*  PLT 146*  --   --  127*  APTT 35  --   --   --   LABPROT 14.8  --   --   --   INR 1.14  --   --   --   CREATININE 4.88*  --   --  6.29*  TROPONINI 0.14* 0.14* 0.14*  --     Estimated Creatinine Clearance: 8.1 mL/min (by C-G formula based on Cr of 6.29).   Medical History: Past Medical History  Diagnosis Date  . Hypertension   . ESRD (end stage renal disease) ([place])     On Tue-Thur-Sat dialysis  . CHF (congestive heart failure) ([facility])     Diastolic heart failure  . Anemia   . Hyperparathyroidism, secondary renal [facility])    Assessment: Pharmacy consulted to dose heparin as a bridge to warfarin for afib in this 69 year old female with ESRD on dialysis.  Goal of Therapy:  Heparin level 0.3-0.7 units/ml INR 2-3   Plan:  Will start heparin IV 1000units/hr with bolus 4000 units IV x 1. Will check heparin level 8 hours after drip started.  Will start coumadin 5mg  PO QPM check daily INR and adjust as indicated.   Plan to discontinue heparin after a minimum of five days of heparin/warfarin and INR in therapeutic range on two consecutive days.   Joyann Spidle C 03/27/2015,4:33 PM

## 2015-03-27 NOTE — Progress Notes (Signed)
Patient with new onset afib and moderate carotid stenosis  No carotid surgery at this time  Will need follow up for the carotid with 6 month interval scans Medical therapy  I concur with Neurology

## 2015-03-27 NOTE — Consult Note (Signed)
CC: slurry speech   HPI: Toni Carlson is an 69 y.o. female end-stage renal disease on Tuesday Thursday Saturday hemodialysis, hypertension, anemia of chronic disease presents to the hospital secondary to slurred speech and also facial droop noticed at dialysis. Currently pt is back to baseline.   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 Pt has also been found to be in A-fib  Past Medical History  Diagnosis Date  . Hypertension   . ESRD (end stage renal disease) ([place])     On Tue-Thur-Sat dialysis  . CHF (congestive heart failure) ([facility])     Diastolic heart failure  . Anemia   . Hyperparathyroidism, secondary renal [facility])     Past Surgical History  Procedure Laterality Date  . Peripheral vascular catheterization N/A 02/23/2015    Procedure: A/V Shuntogram/Fistulagram;  Surgeon: Algernon Huxley, MD;  Location: [place] [facility];  Service: Cardiovascular;  Laterality: N/A;  . Peripheral vascular catheterization N/A 02/23/2015    Procedure: A/V Shunt Intervention;  Surgeon: Algernon Huxley, MD;  Location: [place] [facility];  Service: Cardiovascular;  Laterality: N/A;    Family History  Problem Relation Age of Onset  . CAD Father   . Dementia Mother     Social History:  reports that she has been smoking Cigarettes.  She has a 7.5 pack-year smoking history. She has never used smokeless tobacco. She reports that she does not drink alcohol or use illicit drugs.  No Known Allergies  Medications: I have reviewed the patient's current medications.  ROS: History obtained from the patient  General ROS: negative for - chills, fatigue, fever, night sweats, weight gain or weight loss Psychological ROS: negative for - behavioral disorder, hallucinations, memory difficulties, mood swings or suicidal ideation Ophthalmic ROS: negative for - blurry vision, double vision, eye pain or loss of vision ENT ROS: negative for - epistaxis, nasal discharge, oral lesions, sore  throat, tinnitus or vertigo Allergy and Immunology ROS: negative for - hives or itchy/watery eyes Hematological and Lymphatic ROS: negative for - bleeding problems, bruising or swollen lymph nodes Endocrine ROS: negative for - galactorrhea, hair pattern changes, polydipsia/polyuria or temperature intolerance Respiratory ROS: negative for - cough, hemoptysis, shortness of breath or wheezing Cardiovascular ROS: negative for - chest pain, dyspnea on exertion, edema or irregular heartbeat Gastrointestinal ROS: negative for - abdominal pain, diarrhea, hematemesis, nausea/vomiting or stool incontinence Genito-Urinary ROS: Positive ESRD  Musculoskeletal ROS: negative for - joint swelling or muscular weakness Neurological ROS: as noted in HPI Dermatological ROS: negative for rash and skin lesion changes  Physical Examination: Blood pressure 128/54, pulse 67, temperature 97.7 F (36.5 C), temperature source Oral, resp. rate 19, height 5\' 6"  (1.676 m), weight 150 lb (68.04 kg), SpO2 99 %.   Neurological Examination Mental Status: Alert, oriented, thought content appropriate.  Speech fluent without evidence of aphasia.  Able to follow 3 step commands without difficulty. Cranial Nerves: II: Discs flat bilaterally; Visual fields grossly normal, pupils equal, round, reactive to light and accommodation III,IV, VI: ptosis not present, extra-ocular motions intact bilaterally V,VII: smile symmetric, facial light touch sensation normal bilaterally VIII: hearing normal bilaterally IX,X: gag reflex present XI: bilateral shoulder shrug XII: midline tongue extension Motor: Right : Upper extremity   4+/5    Left:     Upper extremity   4+/5  Lower extremity   4+/5     Lower extremity   4+/5 Tone and bulk:normal tone throughout; no atrophy noted  Sensory: Pinprick and light touch intact throughout, bilaterally Deep Tendon Reflexes: 1+ and symmetric throughout Plantars: Right: downgoing   Left:  downgoing Cerebellar: normal finger-to-nose, normal rapid alternating movements and normal heel-to-shin test Gait: not tested       Laboratory Studies:   Basic Metabolic Panel:  Recent Labs Lab 03/26/15 1149 03/27/15 0534  NA 141 138  K 3.2* 3.5  CL 98* 98*  CO2 30 25  GLUCOSE 100* 99  BUN 30* 47*  CREATININE 4.88* 6.29*  CALCIUM 9.7 9.7    Liver Function Tests:  Recent Labs Lab 03/26/15 1149  AST 18  ALT 13*  ALKPHOS 294*  BILITOT 0.8  PROT 7.2  ALBUMIN 3.4*   No results for input(s): LIPASE, AMYLASE in the last 168 hours. No results for input(s): AMMONIA in the last 168 hours.  CBC:  Recent Labs Lab 03/26/15 1149 03/27/15 0534  WBC 5.6 4.4  NEUTROABS 4.6  --   HGB 11.5* 10.7*  HCT 36.5 34.7*  MCV 89.2 89.9  PLT 146* 127*    Cardiac Enzymes:  Recent Labs Lab 03/26/15 1149 03/26/15 1746 03/26/15 2330  TROPONINI 0.14* 0.14* 0.14*    BNP: Invalid input(s): POCBNP  CBG: No results for input(s): GLUCAP in the last 168 hours.  Microbiology: Results for orders placed or performed in visit on 09/19/11  MRSA PCR Screening     Status: None   Collection Time: 09/19/11 11:25 AM  Result Value Ref Range Status   Micro Text Report   Final       SOURCE: nasal    COMMENT                   NEGATIVE - MRSA target DNA not detected   ANTIBIOTIC                                                        Coagulation Studies:  Recent Labs  03/26/15 1149  LABPROT 14.8  INR 1.14    Urinalysis: No results for input(s): COLORURINE, LABSPEC, PHURINE, GLUCOSEU, HGBUR, BILIRUBINUR, KETONESUR, PROTEINUR, UROBILINOGEN, NITRITE, LEUKOCYTESUR in the last 168 hours.  Invalid input(s): APPERANCEUR  Lipid Panel:     Component Value Date/Time   CHOL 171 03/26/2015 1746   CHOL 176 08/23/2011 0641   TRIG 79 03/26/2015 1746   TRIG 53 08/23/2011 0641   HDL 48 03/26/2015 1746   HDL 65* 08/23/2011 0641   CHOLHDL 3.6 03/26/2015 1746   VLDL 16 03/26/2015  1746   VLDL 11 08/23/2011 0641   LDLCALC 107* 03/26/2015 1746   LDLCALC 100 08/23/2011 0641    HgbA1C:  Lab Results  Component Value Date   HGBA1C 5.6 03/27/2015    Urine Drug Screen:  No results found for: LABOPIA, COCAINSCRNUR, LABBENZ, AMPHETMU, THCU, LABBARB  Alcohol Level: No results for input(s): ETH in the last 168 hours.    Imaging: Ct Head Wo Contrast  03/26/2015  CLINICAL DATA:  Acute onset left-sided paralysis and slurred speech. Chronic renal failure EXAM: CT HEAD WITHOUT CONTRAST TECHNIQUE: Contiguous axial images were obtained from the base of the skull through the vertex without intravenous contrast. COMPARISON:  None. FINDINGS: There is mild diffuse atrophy. There is no intracranial mass, hemorrhage, extra-axial fluid collection, or midline shift. There is patchy small vessel disease throughout the centra semiovale bilaterally.  There is a focal area of decreased attenuation in the right frontal -temporal junction, concerning for early acute infarct. Attenuation of the middle cerebral arteries is symmetric and normal bilaterally. The bony calvarium appears intact. The mastoid air cells are clear. There is carotid artery calcification in the siphon region bilaterally. There are bony changes in the calvarium consistent with secondary hyperparathyroidism from chronic renal failure. Mastoid air cells are clear bilaterally. There is opacification of a left-sided ethmoid air cell. IMPRESSION: Suspect early infarct at the right frontal -temporal junction. There is mild atrophy with patchy small vessel disease throughout the centra semiovale bilaterally. No hemorrhage. Bony changes consistent with secondary hyperparathyroidism/chronic renal failure noted. Mild left-sided ethmoid sinus disease. Critical Value/emergent results were called by telephone at the time of interpretation on 03/26/2015 at 11:49 am to Dr. Meade Maw, ED physician , who verbally acknowledged these results.  Electronically Signed   By: Lowella Grip III M.D.   On: 03/26/2015 11:51   Mr Virgel Paling [id number] Contrast  03/26/2015  ADDENDUM REPORT: 03/26/2015 14:41 ADDENDUM: Study discussed by telephone with Dr. Gladstone Lighter on 03/26/2015 at 1410 hours. Electronically Signed   By: Genevie Ann M.D.   On: 03/26/2015 14:41  03/26/2015  CLINICAL DATA:  69 year old female with left facial droop noted upon arrival to dialysis this morning. Symptoms not improved. Initial encounter. EXAM: MRI HEAD WITHOUT CONTRAST MRA HEAD WITHOUT CONTRAST TECHNIQUE: Multiplanar, multiecho pulse sequences of the brain and surrounding structures were obtained without intravenous contrast. Angiographic images of the head were obtained using MRA technique without contrast. COMPARISON:  Head CT without contrast 1142 hours today. FINDINGS: MRI HEAD FINDINGS 2-3 cm area of restricted diffusion in the right MCA territory affecting the insula and operculum primarily, but with additional scattered cortically based and subcortical white matter infarcts tracking cephalad into the right motor strip (series 100, image 38 and series 102, image 26). Associated T2 and FLAIR hyperintensity with no hemorrhage or mass effect. No contralateral or posterior fossa restricted diffusion. Major intracranial vascular flow voids are within normal limits. Superimposed bilateral patchy and confluent cerebral white matter T2 and FLAIR hyperintensity. No chronic cerebral blood products or cortical encephalomalacia. There is a small chronic lacunar infarct in the right thalamus. Brainstem and cerebellum within normal limits. No midline shift, mass effect, evidence of mass lesion, ventriculomegaly, extra-axial collection or acute intracranial hemorrhage. Cervicomedullary junction and pituitary are within normal limits. Bilateral mastoid effusions. Negative nasopharynx. Trace paranasal sinus mucosal thickening. Rightward gaze deviation. Negative paranasal sinuses. Negative scalp  soft tissues. Degenerative changes in the cervical spine. Decreased T1 bone marrow signal may be related to renal osteodystrophy. MRA HEAD FINDINGS Study is mildly degraded by motion artifact. Absent antegrade flow signal in the distal left vertebral artery. Antegrade flow in the right vertebral artery and basilar. No basilar stenosis. SCA and left PCA origins are patent. Fetal type right PCA origin. Diminutive or absent left posterior communicating artery. The right PCA appears occluded in the P2 segment. The left PCA is irregular but patent. Antegrade flow in both ICA siphons. No siphon stenosis. Infundibulum at the right ophthalmic artery origin suspected. Patent carotid termini. Patent MCA and ACA origins. 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 Motion artifact degrades detail of the a 2 branches which appear patent. Left MCA M1 segment and bifurcation are patent. No left MCA branch occlusion identified. Right MCA M1 segment is patent. There  is irregularity and high-grade stenosis at the right MCA bifurcation. Posterior sylvian divisions appear to remain patent. There is a flow gap at the origin of the dominant anterior division with reconstituted flow (series 107, image 12). IMPRESSION: 1. 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 Involvement of the insula, operculum, and lateral motor strip. 2. Evidence of emergent large vessel occlusion at the right MCA bifurcation, mostly affecting the dominant anterior M2 branch with reconstituted flow distally. 3. 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 4. 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 5. Moderate cerebral white matter signal changes and small chronic lacunar infarct in the right  thalamus compatible with chronic small vessel disease. Electronically Signed: By: Genevie Ann M.D. On: 03/26/2015 14:04   Mr Brain Wo Contrast  03/26/2015  ADDENDUM REPORT: 03/26/2015 14:41 ADDENDUM: Study discussed by telephone with Dr. Gladstone Lighter on 03/26/2015 at 1410 hours. Electronically Signed   By: Genevie Ann M.D.   On: 03/26/2015 14:41  03/26/2015  CLINICAL DATA:  69 year old female with left facial droop noted upon arrival to dialysis this morning. Symptoms not improved. Initial encounter. EXAM: MRI HEAD WITHOUT CONTRAST MRA HEAD WITHOUT CONTRAST TECHNIQUE: Multiplanar, multiecho pulse sequences of the brain and surrounding structures were obtained without intravenous contrast. Angiographic images of the head were obtained using MRA technique without contrast. COMPARISON:  Head CT without contrast 1142 hours today. FINDINGS: MRI HEAD FINDINGS 2-3 cm area of restricted diffusion in the right MCA territory affecting the insula and operculum primarily, but with additional scattered cortically based and subcortical white matter infarcts tracking cephalad into the right motor strip (series 100, image 38 and series 102, image 26). Associated T2 and FLAIR hyperintensity with no hemorrhage or mass effect. No contralateral or posterior fossa restricted diffusion. Major intracranial vascular flow voids are within normal limits. Superimposed bilateral patchy and confluent cerebral white matter T2 and FLAIR hyperintensity. No chronic cerebral blood products or cortical encephalomalacia. There is a small chronic lacunar infarct in the right thalamus. Brainstem and cerebellum within normal limits. No midline shift, mass effect, evidence of mass lesion, ventriculomegaly, extra-axial collection or acute intracranial hemorrhage. Cervicomedullary junction and pituitary are within normal limits. Bilateral mastoid effusions. Negative nasopharynx. Trace paranasal sinus mucosal thickening. Rightward gaze deviation. Negative  paranasal sinuses. Negative scalp soft tissues. Degenerative changes in the cervical spine. Decreased T1 bone marrow signal may be related to renal osteodystrophy. MRA HEAD FINDINGS Study is mildly degraded by motion artifact. Absent antegrade flow signal in the distal left vertebral artery. Antegrade flow in the right vertebral artery and basilar. No basilar stenosis. SCA and left PCA origins are patent. Fetal type right PCA origin. Diminutive or absent left posterior communicating artery. The right PCA appears occluded in the P2 segment. The left PCA is irregular but patent. Antegrade flow in both ICA siphons. No siphon stenosis. Infundibulum at the right ophthalmic artery origin suspected. Patent carotid termini. Patent MCA and ACA origins. 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 Motion artifact degrades detail of the a 2 branches which appear patent. Left MCA M1 segment and bifurcation are patent. No left MCA branch occlusion identified. Right MCA M1 segment is patent. There is irregularity and high-grade stenosis at the right MCA bifurcation. Posterior sylvian divisions appear  to remain patent. There is a flow gap at the origin of the dominant anterior division with reconstituted flow (series 107, image 12). IMPRESSION: 1. 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 Involvement of the insula, operculum, and lateral motor strip. 2. Evidence of emergent large vessel occlusion at the right MCA bifurcation, mostly affecting the dominant anterior M2 branch with reconstituted flow distally. 3. 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 4. 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 5. Moderate cerebral white matter signal changes and small chronic  lacunar infarct in the right thalamus compatible with chronic small vessel disease. Electronically Signed: By: Genevie Ann M.D. On: 03/26/2015 14:04   US Carotid Bilateral  03/26/2015  CLINICAL DATA:  Cerebral infarction, personal history of hypertension, CHF, end-stage renal disease, smoking EXAM: BILATERAL CAROTID DUPLEX ULTRASOUND TECHNIQUE: Pearline Cables scale imaging, color Doppler and duplex ultrasound were performed of bilateral carotid and vertebral arteries in the neck. COMPARISON:  None FINDINGS: Criteria: Quantification of carotid stenosis is based on velocity parameters that correlate the residual internal carotid diameter with NASCET-based stenosis levels, using the diameter of the distal internal carotid lumen as the denominator for stenosis measurement. The following velocity measurements were obtained: RIGHT ICA:  167/43 cm/sec CCA:  0000000 cm/sec SYSTOLIC ICA/CCA RATIO:  [id number] DIASTOLIC ICA/CCA RATIO:  [id number] ECA:  77 cm/sec LEFT ICA:  120/32 cm/sec CCA:  [id number] cm/sec SYSTOLIC ICA/CCA RATIO:  [id number] DIASTOLIC ICA/CCA RATIO:  [id number] ECA:  144 cm/sec RIGHT CAROTID ARTERY: Intimal thickening RIGHT CCA. Small echogenic non shadowing plaque RIGHT carotid bulb. Additional shadowing plaque at distal carotid bulb. Laminar flow on color Doppler imaging. Spectral broadening RIGHT ICA on waveform analysis. No discrete high velocity jet. Mildly tortuous RIGHT carotid system. RIGHT VERTEBRAL ARTERY:  Patent, antegrade LEFT CAROTID ARTERY: Mildly tortuous LEFT carotid system. Intimal thickening LEFT CCA. Echogenic shadowing and non shadowing plaque LEFT carotid bulb extending in the proximal LEFT ICA. Turbulent flow on color Doppler imaging within LEFT ICA. Spectral broadening LEFT ICA on waveform analysis. No high velocity jets. LEFT VERTEBRAL ARTERY:  Patent, antegrade IMPRESSION: Plaque formation at the carotid bifurcations bilaterally associated with turbulent flow at the proximal LEFT ICA. Velocity measurements correspond to  a 50-69% diameter stenosis of the RIGHT carotid and a less than 50% diameter stenosis of the LEFT carotid. Electronically Signed   By: Lavonia Dana M.D.   On: 03/26/2015 15:20     Assessment/Plan: 69 y.o. female end-stage renal disease on Tuesday Thursday Saturday hemodialysis, hypertension, anemia of chronic disease presents to the hospital secondary to slurred speech and also facial droop noticed at dialysis. Currently pt is back to baseline.   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 Pt has also been found to be in A-fib  - Strongly agree with anticoagulation. This is not a big infarct and pt appears back to baseline - NOAC in 5 days - Pt/ot - no further imaging from neuro stand point and d/c planning   Toni Carlson  03/27/2015, 3:12 PM

## 2015-03-27 NOTE — Evaluation (Signed)
Clinical/Bedside Swallow Evaluation Patient Details  Name: Toni Carlson MRN: [id number] Date of Birth: 11-Sep-1945  Today's Date: 03/27/2015 Time: SLP Start Time (ACUTE ONLY): 0825 SLP Stop Time (ACUTE ONLY): 0910 SLP Time Calculation (min) (ACUTE ONLY): 45 min  Past Medical History:  Past Medical History  Diagnosis Date  . Hypertension   . ESRD (end stage renal disease) ([place])     On Tue-Thur-Sat dialysis  . CHF (congestive heart failure) ([facility])     Diastolic heart failure  . Anemia   . Hyperparathyroidism, secondary renal [facility])    Past Surgical History:  Past Surgical History  Procedure Laterality Date  . Peripheral vascular catheterization N/A 02/23/2015    Procedure: A/V Shuntogram/Fistulagram;  Surgeon: Algernon Huxley, MD;  Location: [place] [facility];  Service: Cardiovascular;  Laterality: N/A;  . Peripheral vascular catheterization N/A 02/23/2015    Procedure: A/V Shunt Intervention;  Surgeon: Algernon Huxley, MD;  Location: [place] [facility];  Service: Cardiovascular;  Laterality: N/A;   HPI:  Pt is a 69 y.o. female with a known history of HTN, CHF, and end-stage renal disease on Tuesday Thursday Saturday hemodialysis, hypertension, anemia of chronic disease presents to the hospital secondary to slurred speech and also facial droop noticed today at dialysis. Symptoms actually started yesterday when her speech was slurred noticed by her family, but patient thought it was secondary to her cold and congestion that she was having thick slurred speech. She denied any weakness or sensory changes otherwise.. Today at dialysis she was noted to have left facial droop and drooling and so sent to the emergency room. She has come outside the 2 hour window for TPA. She still has dysarthria and left facial droop and weakness of her left arm. No fevers or chills. No sensory changes. No dysphagia noted during initial ED screen. Pt is currently verbally conversive; A/Ox3. Min. Left labial weakness  w/ min. Dysarthria. Pt was conversive and intelligible w/ all wants/needs to SLP and [facility] staff. No confusion noted.     Assessment / Plan / Recommendation Clinical Impression  Pt appeared to adequately tolerate trials of thin liquids and soft solids w/ no overt s/s of aspiration noted; min. oral phase deficit of decreased sensation and labial weakness in Left corner of mouth resulting in min. bolus residue on lips which pt was unaware of. Pt was instructed to use lingual sweeping to the Left for clearing orally and labially which she was able to perform independently. Pt appears at reduced risk for aspiration at this time following general aspiration precautions; lingual sweeping of oral cavity and lips on Left side. Rec. continue aspiration precautions at home at d/c. Pt was instructed on labial and lingual exs., models given for exs and over-articulation(big speech). Handouts on such left in room w/ pt for home d/c. Pt was educated on f/u w/ Outpt services for ST therapy for Dysarthria, however, pt stated she felt she "would be fine" and "wouldn't need to" but agreed to f/u w/ primary MD post d/c. ST will be available for any further education/services while pt is admitted per NSG request/consult.    Aspiration Risk   (reduced risk following aspiration precautions)    Diet Recommendation     Medication Administration: Whole meds with liquid    Other  Recommendations Recommended Consults:  (TBD) Oral Care Recommendations: Oral care BID;Patient independent with oral care   Follow up Recommendations  Outpatient SLP (as pt requests)    Frequency and Duration  Swallow Study   General Date of Onset: 03/26/15 HPI: Pt is a 69 y.o. female with a known history of HTN, CHF, and end-stage renal disease on Tuesday Thursday Saturday hemodialysis, hypertension, anemia of chronic disease presents to the hospital secondary to slurred speech and also facial droop noticed today at dialysis.  Symptoms actually started yesterday when her speech was slurred noticed by her family, but patient thought it was secondary to her cold and congestion that she was having thick slurred speech. She denied any weakness or sensory changes otherwise.. Today at dialysis she was noted to have left facial droop and drooling and so sent to the emergency room. She has come outside the 2 hour window for TPA. She still has dysarthria and left facial droop and weakness of her left arm. No fevers or chills. No sensory changes. No dysphagia noted during initial ED screen. Pt is currently verbally conversive; A/Ox3. Min. Left labial weakness w/ min. Dysarthria. Pt was conversive and intelligible w/ all wants/needs to SLP and [facility] staff. No confusion noted.   Type of Study: Bedside Swallow Evaluation Previous Swallow Assessment: none Diet Prior to this Study: Regular;Thin liquids Temperature Spikes Noted: No (wbc 4.4) Respiratory Status: Room air History of Recent Intubation: No Behavior/Cognition: Alert;Cooperative;Pleasant mood Oral Cavity Assessment: Within Functional Limits Oral Care Completed by SLP: No (eating her breakfast meal) Oral Cavity - Dentition: Adequate natural dentition Vision: Functional for self-feeding Self-Feeding Abilities: Able to feed self Patient Positioning: Upright in chair Baseline Vocal Quality: Normal Volitional Cough: Strong Volitional Swallow: Able to elicit    Oral/Motor/Sensory Function Overall Oral Motor/Sensory Function: Mild impairment Facial ROM: Within Functional Limits Facial Symmetry: Within Functional Limits (grossly. Decreased Labial tone/strength on Left) Lingual ROM: Within Functional Limits Lingual Symmetry: Within Functional Limits Lingual Strength: Within Functional Limits Velum: Within Functional Limits Mandible: Within Functional Limits   Ice Chips Ice chips: Not tested   Thin Liquid Thin Liquid: Within functional limits Presentation: Self  Fed;Straw;Cup Other Comments: 4-5 trials each of coffee then juice(straw)    Nectar Thick Nectar Thick Liquid: Not tested   Honey Thick Honey Thick Liquid: Not tested   Puree Puree: Within functional limits Presentation: Self Fed;Spoon Other Comments: 3-4 trials   Solid Solid: Impaired Presentation: Self Fed;Spoon Oral Phase Functional Implications: Left anterior spillage Pharyngeal Phase Impairments:  (none) Other Comments: 6+ trials       Orinda Kenner, MS, CCC-SLP  Harvin Konicek 03/27/2015,11:17 AM

## 2015-03-27 NOTE — Plan of Care (Signed)
Problem: Education: Goal: Knowledge of patient specific risk factors addressed and post discharge goals established will improve Patient remains NIH 3 neuro checks now every 4 hours, nothing has changed on neuro checks, patient continues to have left side droop, and weakness.  VSS

## 2015-03-27 NOTE — Progress Notes (Signed)
[facility]  ROUNDING NOTE   Subjective:   Now with new onset atrial fibrillation  Objective:  Vital signs in last 24 hours:  Temp:  [97.4 F (36.3 C)-98.6 F (37 C)] 98.5 F (36.9 C) (11/11 0822) Pulse Rate:  [70-83] 77 (11/11 0822) Resp:  [18-22] 20 (11/11 0822) BP: (137-169)/(64-82) 169/73 mmHg (11/11 0822) SpO2:  [94 %-100 %] 100 % (11/11 0822) Weight:  [68.04 kg (150 lb)] 68.04 kg (150 lb) (11/10 1128)  Weight change:  Filed Weights   03/26/15 1128  Weight: 68.04 kg (150 lb)    Intake/Output: I/O last 3 completed shifts: In: -  Out: 50 [Urine:50]   Intake/Output this shift:     Physical Exam: General: NAD  Head: Normocephalic, atraumatic. Moist oral mucosal membranes  Eyes: Anicteric, PERRL  Neck: Supple, trachea midline  Lungs:  Clear to auscultation  Heart: Irregular irregular  Abdomen:  Soft, nontender,   Extremities: no peripheral edema.  Neurologic: Left sided weakness, left sided facial droop  Skin: No lesions  Access: Left arm AVF    Basic Metabolic Panel:  Recent Labs Lab 03/26/15 1149 03/27/15 0534  NA 141 138  K 3.2* 3.5  CL 98* 98*  CO2 30 25  GLUCOSE 100* 99  BUN 30* 47*  CREATININE 4.88* 6.29*  CALCIUM 9.7 9.7    Liver Function Tests:  Recent Labs Lab 03/26/15 1149  AST 18  ALT 13*  ALKPHOS 294*  BILITOT 0.8  PROT 7.2  ALBUMIN 3.4*   No results for input(s): LIPASE, AMYLASE in the last 168 hours. No results for input(s): AMMONIA in the last 168 hours.  CBC:  Recent Labs Lab 03/26/15 1149 03/27/15 0534  WBC 5.6 4.4  NEUTROABS 4.6  --   HGB 11.5* 10.7*  HCT 36.5 34.7*  MCV 89.2 89.9  PLT 146* 127*    Cardiac Enzymes:  Recent Labs Lab 03/26/15 1149 03/26/15 1746 03/26/15 2330  TROPONINI 0.14* 0.14* 0.14*    BNP: Invalid input(s): POCBNP  CBG: No results for input(s): GLUCAP in the last 168 hours.  Microbiology: Results for orders placed or performed in visit on 09/19/11  MRSA PCR  Screening     Status: None   Collection Time: 09/19/11 11:25 AM  Result Value Ref Range Status   Micro Text Report   Final       SOURCE: nasal    COMMENT                   NEGATIVE - MRSA target DNA not detected   ANTIBIOTIC                                                        Coagulation Studies:  Recent Labs  03/26/15 1149  LABPROT 14.8  INR 1.14    Urinalysis: No results for input(s): COLORURINE, LABSPEC, PHURINE, GLUCOSEU, HGBUR, BILIRUBINUR, KETONESUR, PROTEINUR, UROBILINOGEN, NITRITE, LEUKOCYTESUR in the last 72 hours.  Invalid input(s): APPERANCEUR    Imaging: Ct Head Wo Contrast  03/26/2015  CLINICAL DATA:  Acute onset left-sided paralysis and slurred speech. Chronic renal failure EXAM: CT HEAD WITHOUT CONTRAST TECHNIQUE: Contiguous axial images were obtained from the base of the skull through the vertex without intravenous contrast. COMPARISON:  None. FINDINGS: There is mild diffuse atrophy. There is no intracranial mass, hemorrhage, extra-axial  fluid collection, or midline shift. There is patchy small vessel disease throughout the centra semiovale bilaterally. There is a focal area of decreased attenuation in the right frontal -temporal junction, concerning for early acute infarct. Attenuation of the middle cerebral arteries is symmetric and normal bilaterally. The bony calvarium appears intact. The mastoid air cells are clear. There is carotid artery calcification in the siphon region bilaterally. There are bony changes in the calvarium consistent with secondary hyperparathyroidism from chronic renal failure. Mastoid air cells are clear bilaterally. There is opacification of a left-sided ethmoid air cell. IMPRESSION: Suspect early infarct at the right frontal -temporal junction. There is mild atrophy with patchy small vessel disease throughout the centra semiovale bilaterally. No hemorrhage. Bony changes consistent with secondary hyperparathyroidism/chronic renal failure  noted. Mild left-sided ethmoid sinus disease. Critical Value/emergent results were called by telephone at the time of interpretation on 03/26/2015 at 11:49 am to Dr. Meade Maw, ED physician , who verbally acknowledged these results. Electronically Signed   By: Lowella Grip III M.D.   On: 03/26/2015 11:51   Mr Virgel Paling [id number] Contrast  03/26/2015  ADDENDUM REPORT: 03/26/2015 14:41 ADDENDUM: Study discussed by telephone with Dr. Gladstone Lighter on 03/26/2015 at 1410 hours. Electronically Signed   By: Genevie Ann M.D.   On: 03/26/2015 14:41  03/26/2015  CLINICAL DATA:  69 year old female with left facial droop noted upon arrival to dialysis this morning. Symptoms not improved. Initial encounter. EXAM: MRI HEAD WITHOUT CONTRAST MRA HEAD WITHOUT CONTRAST TECHNIQUE: Multiplanar, multiecho pulse sequences of the brain and surrounding structures were obtained without intravenous contrast. Angiographic images of the head were obtained using MRA technique without contrast. COMPARISON:  Head CT without contrast 1142 hours today. FINDINGS: MRI HEAD FINDINGS 2-3 cm area of restricted diffusion in the right MCA territory affecting the insula and operculum primarily, but with additional scattered cortically based and subcortical white matter infarcts tracking cephalad into the right motor strip (series 100, image 38 and series 102, image 26). Associated T2 and FLAIR hyperintensity with no hemorrhage or mass effect. No contralateral or posterior fossa restricted diffusion. Major intracranial vascular flow voids are within normal limits. Superimposed bilateral patchy and confluent cerebral white matter T2 and FLAIR hyperintensity. No chronic cerebral blood products or cortical encephalomalacia. There is a small chronic lacunar infarct in the right thalamus. Brainstem and cerebellum within normal limits. No midline shift, mass effect, evidence of mass lesion, ventriculomegaly, extra-axial collection or acute intracranial  hemorrhage. Cervicomedullary junction and pituitary are within normal limits. Bilateral mastoid effusions. Negative nasopharynx. Trace paranasal sinus mucosal thickening. Rightward gaze deviation. Negative paranasal sinuses. Negative scalp soft tissues. Degenerative changes in the cervical spine. Decreased T1 bone marrow signal may be related to renal osteodystrophy. MRA HEAD FINDINGS Study is mildly degraded by motion artifact. Absent antegrade flow signal in the distal left vertebral artery. Antegrade flow in the right vertebral artery and basilar. No basilar stenosis. SCA and left PCA origins are patent. Fetal type right PCA origin. Diminutive or absent left posterior communicating artery. The right PCA appears occluded in the P2 segment. The left PCA is irregular but patent. Antegrade flow in both ICA siphons. No siphon stenosis. Infundibulum at the right ophthalmic artery origin suspected. Patent carotid termini. Patent MCA and ACA origins. 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 Motion artifact degrades detail of the a 2 branches which appear patent. Left MCA M1 segment and  bifurcation are patent. No left MCA branch occlusion identified. Right MCA M1 segment is patent. There is irregularity and high-grade stenosis at the right MCA bifurcation. Posterior sylvian divisions appear to remain patent. There is a flow gap at the origin of the dominant anterior division with reconstituted flow (series 107, image 12). IMPRESSION: 1. 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 Involvement of the insula, operculum, and lateral motor strip. 2. Evidence of emergent large vessel occlusion at the right MCA bifurcation, mostly affecting the dominant anterior M2 branch with reconstituted flow distally. 3. 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 4.  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 5. Moderate cerebral white matter signal changes and small chronic lacunar infarct in the right thalamus compatible with chronic small vessel disease. Electronically Signed: By: Genevie Ann M.D. On: 03/26/2015 14:04   Mr Brain Wo Contrast  03/26/2015  ADDENDUM REPORT: 03/26/2015 14:41 ADDENDUM: Study discussed by telephone with Dr. Gladstone Lighter on 03/26/2015 at 1410 hours. Electronically Signed   By: Genevie Ann M.D.   On: 03/26/2015 14:41  03/26/2015  CLINICAL DATA:  69 year old female with left facial droop noted upon arrival to dialysis this morning. Symptoms not improved. Initial encounter. EXAM: MRI HEAD WITHOUT CONTRAST MRA HEAD WITHOUT CONTRAST TECHNIQUE: Multiplanar, multiecho pulse sequences of the brain and surrounding structures were obtained without intravenous contrast. Angiographic images of the head were obtained using MRA technique without contrast. COMPARISON:  Head CT without contrast 1142 hours today. FINDINGS: MRI HEAD FINDINGS 2-3 cm area of restricted diffusion in the right MCA territory affecting the insula and operculum primarily, but with additional scattered cortically based and subcortical white matter infarcts tracking cephalad into the right motor strip (series 100, image 38 and series 102, image 26). Associated T2 and FLAIR hyperintensity with no hemorrhage or mass effect. No contralateral or posterior fossa restricted diffusion. Major intracranial vascular flow voids are within normal limits. Superimposed bilateral patchy and confluent cerebral white matter T2 and FLAIR hyperintensity. No chronic cerebral blood products or cortical encephalomalacia. There is a small chronic lacunar infarct in the right thalamus. Brainstem and cerebellum within normal limits. No midline shift, mass effect, evidence of mass lesion, ventriculomegaly,  extra-axial collection or acute intracranial hemorrhage. Cervicomedullary junction and pituitary are within normal limits. Bilateral mastoid effusions. Negative nasopharynx. Trace paranasal sinus mucosal thickening. Rightward gaze deviation. Negative paranasal sinuses. Negative scalp soft tissues. Degenerative changes in the cervical spine. Decreased T1 bone marrow signal may be related to renal osteodystrophy. MRA HEAD FINDINGS Study is mildly degraded by motion artifact. Absent antegrade flow signal in the distal left vertebral artery. Antegrade flow in the right vertebral artery and basilar. No basilar stenosis. SCA and left PCA origins are patent. Fetal type right PCA origin. Diminutive or absent left posterior communicating artery. The right PCA appears occluded in the P2 segment. The left PCA is irregular but patent. Antegrade flow in both ICA siphons. No siphon stenosis. Infundibulum at the right ophthalmic artery origin suspected. Patent carotid termini. Patent MCA and ACA origins. 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 Motion artifact degrades detail of the a 2 branches which appear patent. Left MCA M1 segment and bifurcation are patent. No left MCA branch occlusion identified. Right MCA M1 segment is  patent. There is irregularity and high-grade stenosis at the right MCA bifurcation. Posterior sylvian divisions appear to remain patent. There is a flow gap at the origin of the dominant anterior division with reconstituted flow (series 107, image 12). IMPRESSION: 1. 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 Involvement of the insula, operculum, and lateral motor strip. 2. Evidence of emergent large vessel occlusion at the right MCA bifurcation, mostly affecting the dominant anterior M2 branch with reconstituted flow distally. 3. 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 Small 2-3 mm right ophthalmic  artery aneurysm versus infundibulum. 4. 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 5. Moderate cerebral white matter signal changes and small chronic lacunar infarct in the right thalamus compatible with chronic small vessel disease. Electronically Signed: By: Genevie Ann M.D. On: 03/26/2015 14:04   US Carotid Bilateral  03/26/2015  CLINICAL DATA:  Cerebral infarction, personal history of hypertension, CHF, end-stage renal disease, smoking EXAM: BILATERAL CAROTID DUPLEX ULTRASOUND TECHNIQUE: Pearline Cables scale imaging, color Doppler and duplex ultrasound were performed of bilateral carotid and vertebral arteries in the neck. COMPARISON:  None FINDINGS: Criteria: Quantification of carotid stenosis is based on velocity parameters that correlate the residual internal carotid diameter with NASCET-based stenosis levels, using the diameter of the distal internal carotid lumen as the denominator for stenosis measurement. The following velocity measurements were obtained: RIGHT ICA:  167/43 cm/sec CCA:  0000000 cm/sec SYSTOLIC ICA/CCA RATIO:  [id number] DIASTOLIC ICA/CCA RATIO:  [id number] ECA:  77 cm/sec LEFT ICA:  120/32 cm/sec CCA:  [id number] cm/sec SYSTOLIC ICA/CCA RATIO:  [id number] DIASTOLIC ICA/CCA RATIO:  [id number] ECA:  144 cm/sec RIGHT CAROTID ARTERY: Intimal thickening RIGHT CCA. Small echogenic non shadowing plaque RIGHT carotid bulb. Additional shadowing plaque at distal carotid bulb. Laminar flow on color Doppler imaging. Spectral broadening RIGHT ICA on waveform analysis. No discrete high velocity jet. Mildly tortuous RIGHT carotid system. RIGHT VERTEBRAL ARTERY:  Patent, antegrade LEFT CAROTID ARTERY: Mildly tortuous LEFT carotid system. Intimal thickening LEFT CCA. Echogenic shadowing and non shadowing plaque LEFT carotid bulb extending in the proximal LEFT ICA. Turbulent flow on color Doppler imaging within LEFT ICA. Spectral broadening LEFT  ICA on waveform analysis. No high velocity jets. LEFT VERTEBRAL ARTERY:  Patent, antegrade IMPRESSION: Plaque formation at the carotid bifurcations bilaterally associated with turbulent flow at the proximal LEFT ICA. Velocity measurements correspond to a 50-69% diameter stenosis of the RIGHT carotid and a less than 50% diameter stenosis of the LEFT carotid. Electronically Signed   By: Lavonia Dana M.D.   On: 03/26/2015 15:20     Medications:     . amLODipine  10 mg Oral Daily  . [START ON 03/28/2015] aspirin EC  325 mg Oral Daily  . atorvastatin  40 mg Oral q1800  . calcium acetate (Phos Binder)  2,668 mg Oral TID WC  . docusate sodium  100 mg Oral BID  . heparin  5,000 Units Subcutaneous 3 times per day  . hydrALAZINE  25 mg Oral q morning - 10a   And  . hydrALAZINE  50 mg Oral QPM  . losartan  100 mg Oral Daily  . pantoprazole  40 mg Oral BID  . sodium chloride  3 mL Intravenous Q12H   acetaminophen **OR** acetaminophen, ondansetron **OR** ondansetron (ZOFRAN) IV, polyethylene glycol  Assessment/ Plan:  Toni Carlson is a 69 y.o. black female with hypertension, GERD, ESRD  [facility]  TTS [place]  1. ESRD: completed hemodialysis treatment . No acute indication for dailysis.  Plan to continue TTS schedule.   2. Acute CVA: Right MCA infarction. Due to new onset atrial fibrillation?  - Aspirin started - start carvedilol today - appreciate cardiology and neurology input.   3. Anemia of chronic kidney disease: hemoglobin 10.7 - with acute ischemic event, will hold epo.   4. Secondary Hyperparathyroidism: PTH 4769. Does not tolerate cinacalcet - on high dose hectorol as outpatient.  - phosphorus at goal, 4.3 - restart calcium acetate  5. Hypertension: well controlled. Allow blood pressure to stay slightly elevated to help with perfusion.  - amlodipine, hydralazine and losartan.  - carvedilol as above.    LOS: [place], [place] 11/11/201610:14 AM

## 2015-03-27 NOTE — Plan of Care (Signed)
Problem: Education: Goal: Knowledge of patient specific risk factors addressed and post discharge goals established will improve Outcome: Not Progressing Afib, stroke prevention and hypertension education added to exit care.

## 2015-03-27 NOTE — Progress Notes (Addendum)
[facility] NAME: Toni Carlson    MR#:  [id number]  DATE OF BIRTH:  June 04, 1945  SUBJECTIVE:  CHIEF COMPLAINT:   Chief Complaint  Patient presents with  . Code Stroke   better slurred speech, no dysphagia.  REVIEW OF SYSTEMS:  CONSTITUTIONAL: No fever, fatigue or weakness.  EYES: No blurred or double vision.  EARS, NOSE, AND THROAT: No tinnitus or ear pain.  RESPIRATORY: No cough, shortness of breath, wheezing or hemoptysis.  CARDIOVASCULAR: No chest pain, orthopnea, edema.  GASTROINTESTINAL: No nausea, vomiting, diarrhea or abdominal pain.  GENITOURINARY: No dysuria, hematuria.  ENDOCRINE: No polyuria, nocturia,  HEMATOLOGY: No anemia, easy bruising or bleeding SKIN: No rash or lesion. MUSCULOSKELETAL: No joint pain or arthritis.   NEUROLOGIC: No tingling, numbness, weakness. But has slurred speech. PSYCHIATRY: No anxiety or depression.   DRUG ALLERGIES:  No Known Allergies  VITALS:  Blood pressure 128/54, pulse 67, temperature 97.7 F (36.5 C), temperature source Oral, resp. rate 19, height 5\' 6"  (1.676 m), weight 68.04 kg (150 lb), SpO2 99 %.  PHYSICAL EXAMINATION:  GENERAL:  69 y.o.-year-old patient lying in the bed with no acute distress.  EYES: Pupils equal, round, reactive to light and accommodation. No scleral icterus. Extraocular muscles intact.  HEENT: Head atraumatic, normocephalic. Oropharynx and nasopharynx clear.  NECK:  Supple, no jugular venous distention. No thyroid enlargement, no tenderness.  LUNGS: Normal breath sounds bilaterally, no wheezing, rales,rhonchi or crepitation. No use of accessory muscles of respiration.  CARDIOVASCULAR: S1, S2 normal. No murmurs, rubs, or gallops.  ABDOMEN: Soft, nontender, nondistended. Bowel sounds present. No organomegaly or mass.  EXTREMITIES: No pedal edema, cyanosis, or clubbing.  NEUROLOGIC: Cranial nerves II through XII are intact. Muscle strength 4/5 in all  extremities. Sensation intact. Gait not checked.  PSYCHIATRIC: The patient is alert and oriented x 3.  SKIN: No obvious rash, lesion, or ulcer.    LABORATORY PANEL:   CBC  Recent Labs Lab 03/27/15 0534  WBC 4.4  HGB 10.7*  HCT 34.7*  PLT 127*   ------------------------------------------------------------------------------------------------------------------  Chemistries   Recent Labs Lab 03/26/15 1149 03/27/15 0534  NA 141 138  K 3.2* 3.5  CL 98* 98*  CO2 30 25  GLUCOSE 100* 99  BUN 30* 47*  CREATININE 4.88* 6.29*  CALCIUM 9.7 9.7  AST 18  --   ALT 13*  --   ALKPHOS 294*  --   BILITOT 0.8  --    ------------------------------------------------------------------------------------------------------------------  Cardiac Enzymes  Recent Labs Lab 03/26/15 2330  TROPONINI 0.14*   ------------------------------------------------------------------------------------------------------------------  RADIOLOGY:  Ct Head Wo Contrast  03/26/2015  CLINICAL DATA:  Acute onset left-sided paralysis and slurred speech. Chronic renal failure EXAM: CT HEAD WITHOUT CONTRAST TECHNIQUE: Contiguous axial images were obtained from the base of the skull through the vertex without intravenous contrast. COMPARISON:  None. FINDINGS: There is mild diffuse atrophy. There is no intracranial mass, hemorrhage, extra-axial fluid collection, or midline shift. There is patchy small vessel disease throughout the centra semiovale bilaterally. There is a focal area of decreased attenuation in the right frontal -temporal junction, concerning for early acute infarct. Attenuation of the middle cerebral arteries is symmetric and normal bilaterally. The bony calvarium appears intact. The mastoid air cells are clear. There is carotid artery calcification in the siphon region bilaterally. There are bony changes in the calvarium consistent with secondary hyperparathyroidism from chronic renal failure. Mastoid air  cells are clear bilaterally. There is opacification  of a left-sided ethmoid air cell. IMPRESSION: Suspect early infarct at the right frontal -temporal junction. There is mild atrophy with patchy small vessel disease throughout the centra semiovale bilaterally. No hemorrhage. Bony changes consistent with secondary hyperparathyroidism/chronic renal failure noted. Mild left-sided ethmoid sinus disease. Critical Value/emergent results were called by telephone at the time of interpretation on 03/26/2015 at 11:49 am to Dr. Meade Maw, ED physician , who verbally acknowledged these results. Electronically Signed   By: Lowella Grip III M.D.   On: 03/26/2015 11:51   Mr Virgel Paling [id number] Contrast  03/26/2015  ADDENDUM REPORT: 03/26/2015 14:41 ADDENDUM: Study discussed by telephone with Dr. Gladstone Lighter on 03/26/2015 at 1410 hours. Electronically Signed   By: Genevie Ann M.D.   On: 03/26/2015 14:41  03/26/2015  CLINICAL DATA:  69 year old female with left facial droop noted upon arrival to dialysis this morning. Symptoms not improved. Initial encounter. EXAM: MRI HEAD WITHOUT CONTRAST MRA HEAD WITHOUT CONTRAST TECHNIQUE: Multiplanar, multiecho pulse sequences of the brain and surrounding structures were obtained without intravenous contrast. Angiographic images of the head were obtained using MRA technique without contrast. COMPARISON:  Head CT without contrast 1142 hours today. FINDINGS: MRI HEAD FINDINGS 2-3 cm area of restricted diffusion in the right MCA territory affecting the insula and operculum primarily, but with additional scattered cortically based and subcortical white matter infarcts tracking cephalad into the right motor strip (series 100, image 38 and series 102, image 26). Associated T2 and FLAIR hyperintensity with no hemorrhage or mass effect. No contralateral or posterior fossa restricted diffusion. Major intracranial vascular flow voids are within normal limits. Superimposed bilateral patchy and  confluent cerebral white matter T2 and FLAIR hyperintensity. No chronic cerebral blood products or cortical encephalomalacia. There is a small chronic lacunar infarct in the right thalamus. Brainstem and cerebellum within normal limits. No midline shift, mass effect, evidence of mass lesion, ventriculomegaly, extra-axial collection or acute intracranial hemorrhage. Cervicomedullary junction and pituitary are within normal limits. Bilateral mastoid effusions. Negative nasopharynx. Trace paranasal sinus mucosal thickening. Rightward gaze deviation. Negative paranasal sinuses. Negative scalp soft tissues. Degenerative changes in the cervical spine. Decreased T1 bone marrow signal may be related to renal osteodystrophy. MRA HEAD FINDINGS Study is mildly degraded by motion artifact. Absent antegrade flow signal in the distal left vertebral artery. Antegrade flow in the right vertebral artery and basilar. No basilar stenosis. SCA and left PCA origins are patent. Fetal type right PCA origin. Diminutive or absent left posterior communicating artery. The right PCA appears occluded in the P2 segment. The left PCA is irregular but patent. Antegrade flow in both ICA siphons. No siphon stenosis. Infundibulum at the right ophthalmic artery origin suspected. Patent carotid termini. Patent MCA and ACA origins. 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 Motion artifact degrades detail of the a 2 branches which appear patent. Left MCA M1 segment and bifurcation are patent. No left MCA branch occlusion identified. Right MCA M1 segment is patent. There is irregularity and high-grade stenosis at the right MCA bifurcation. Posterior sylvian divisions appear to remain patent. There is a flow gap at the origin of the dominant anterior division with reconstituted flow (series 107, image 12). IMPRESSION: 1. Acute right MCA infarct without hemorrhage  or mass effect. Involvement of the insula, operculum, and lateral motor strip. 2. Evidence of emergent large vessel occlusion at the right MCA bifurcation, mostly affecting the dominant anterior M2  branch with reconstituted flow distally. 3. 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 4. 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 5. Moderate cerebral white matter signal changes and small chronic lacunar infarct in the right thalamus compatible with chronic small vessel disease. Electronically Signed: By: Genevie Ann M.D. On: 03/26/2015 14:04   Mr Brain Wo Contrast  03/26/2015  ADDENDUM REPORT: 03/26/2015 14:41 ADDENDUM: Study discussed by telephone with Dr. Gladstone Lighter on 03/26/2015 at 1410 hours. Electronically Signed   By: Genevie Ann M.D.   On: 03/26/2015 14:41  03/26/2015  CLINICAL DATA:  69 year old female with left facial droop noted upon arrival to dialysis this morning. Symptoms not improved. Initial encounter. EXAM: MRI HEAD WITHOUT CONTRAST MRA HEAD WITHOUT CONTRAST TECHNIQUE: Multiplanar, multiecho pulse sequences of the brain and surrounding structures were obtained without intravenous contrast. Angiographic images of the head were obtained using MRA technique without contrast. COMPARISON:  Head CT without contrast 1142 hours today. FINDINGS: MRI HEAD FINDINGS 2-3 cm area of restricted diffusion in the right MCA territory affecting the insula and operculum primarily, but with additional scattered cortically based and subcortical white matter infarcts tracking cephalad into the right motor strip (series 100, image 38 and series 102, image 26). Associated T2 and FLAIR hyperintensity with no hemorrhage or mass effect. No contralateral or posterior fossa restricted diffusion. Major intracranial vascular flow voids are within normal limits.  Superimposed bilateral patchy and confluent cerebral white matter T2 and FLAIR hyperintensity. No chronic cerebral blood products or cortical encephalomalacia. There is a small chronic lacunar infarct in the right thalamus. Brainstem and cerebellum within normal limits. No midline shift, mass effect, evidence of mass lesion, ventriculomegaly, extra-axial collection or acute intracranial hemorrhage. Cervicomedullary junction and pituitary are within normal limits. Bilateral mastoid effusions. Negative nasopharynx. Trace paranasal sinus mucosal thickening. Rightward gaze deviation. Negative paranasal sinuses. Negative scalp soft tissues. Degenerative changes in the cervical spine. Decreased T1 bone marrow signal may be related to renal osteodystrophy. MRA HEAD FINDINGS Study is mildly degraded by motion artifact. Absent antegrade flow signal in the distal left vertebral artery. Antegrade flow in the right vertebral artery and basilar. No basilar stenosis. SCA and left PCA origins are patent. Fetal type right PCA origin. Diminutive or absent left posterior communicating artery. The right PCA appears occluded in the P2 segment. The left PCA is irregular but patent. Antegrade flow in both ICA siphons. No siphon stenosis. Infundibulum at the right ophthalmic artery origin suspected. Patent carotid termini. Patent MCA and ACA origins. 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 Motion artifact degrades detail of the a 2 branches which appear patent. Left MCA M1 segment and bifurcation are patent. No left MCA branch occlusion identified. Right MCA M1 segment is patent. There is irregularity and high-grade stenosis at the right MCA bifurcation. Posterior sylvian divisions appear to remain patent. There is a flow gap at the origin of the dominant anterior division with reconstituted flow (series 107, image 12). IMPRESSION: 1. Acute  right MCA infarct without hemorrhage or mass effect. Involvement of the insula, operculum, and lateral motor strip. 2. Evidence of emergent large vessel occlusion at the right MCA bifurcation, mostly affecting the dominant anterior M2 branch with reconstituted flow distally. 3. 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 Small  2-3 mm right ophthalmic artery aneurysm versus infundibulum. 4. 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 5. Moderate cerebral white matter signal changes and small chronic lacunar infarct in the right thalamus compatible with chronic small vessel disease. Electronically Signed: By: Genevie Ann M.D. On: 03/26/2015 14:04   US Carotid Bilateral  03/26/2015  CLINICAL DATA:  Cerebral infarction, personal history of hypertension, CHF, end-stage renal disease, smoking EXAM: BILATERAL CAROTID DUPLEX ULTRASOUND TECHNIQUE: Pearline Cables scale imaging, color Doppler and duplex ultrasound were performed of bilateral carotid and vertebral arteries in the neck. COMPARISON:  None FINDINGS: Criteria: Quantification of carotid stenosis is based on velocity parameters that correlate the residual internal carotid diameter with NASCET-based stenosis levels, using the diameter of the distal internal carotid lumen as the denominator for stenosis measurement. The following velocity measurements were obtained: RIGHT ICA:  167/43 cm/sec CCA:  0000000 cm/sec SYSTOLIC ICA/CCA RATIO:  [id number] DIASTOLIC ICA/CCA RATIO:  [id number] ECA:  77 cm/sec LEFT ICA:  120/32 cm/sec CCA:  [id number] cm/sec SYSTOLIC ICA/CCA RATIO:  [id number] DIASTOLIC ICA/CCA RATIO:  [id number] ECA:  144 cm/sec RIGHT CAROTID ARTERY: Intimal thickening RIGHT CCA. Small echogenic non shadowing plaque RIGHT carotid bulb. Additional shadowing plaque at distal carotid bulb. Laminar flow on color Doppler imaging. Spectral broadening RIGHT ICA on waveform analysis. No discrete high velocity jet.  Mildly tortuous RIGHT carotid system. RIGHT VERTEBRAL ARTERY:  Patent, antegrade LEFT CAROTID ARTERY: Mildly tortuous LEFT carotid system. Intimal thickening LEFT CCA. Echogenic shadowing and non shadowing plaque LEFT carotid bulb extending in the proximal LEFT ICA. Turbulent flow on color Doppler imaging within LEFT ICA. Spectral broadening LEFT ICA on waveform analysis. No high velocity jets. LEFT VERTEBRAL ARTERY:  Patent, antegrade IMPRESSION: Plaque formation at the carotid bifurcations bilaterally associated with turbulent flow at the proximal LEFT ICA. Velocity measurements correspond to a 50-69% diameter stenosis of the RIGHT carotid and a less than 50% diameter stenosis of the LEFT carotid. Electronically Signed   By: Lavonia Dana M.D.   On: 03/26/2015 15:20    EKG:   Orders placed or performed during the hospital encounter of 03/26/15  . ED EKG  . ED EKG  . EKG 12-Lead  . EKG 12-Lead  . EKG 12-Lead  . EKG 12-Lead  . EKG 12-Lead  . EKG 12-Lead    ASSESSMENT AND PLAN:   #1 Acute CVA.  -CT of the head with possible right frontotemporal infarct. MRI: right MCA infart. Passed speech study,  follow-up physical therapy consult and occupational therapy consult. Carotid Dopplers showed left side carotid artery stenosis 50-69%; echocardiogram: EF 60% to 65%. Continue aspirin and Lipitor. Follow-up neurologist consult and vascular surgery consult..  #2 end-stage renal disease-on Tuesday Thursday Saturday hemodialysis. Continue PhosLo and Renvela.  #3 hypertension. Continue Norvasc, losartan and hydralazine.  #4 anemia of chronic disease-hemoglobin is stable.  *New onset of A. Fib. Rate is controlled. Started Coreg 3.125 mg twice a day. Aspirin for now and need Dave-term full dose anticoagulation with Coumadin to be started after neurology consult per cardiology consult.  *  mild elevated troponin. Due to CVA or ESRD.  I discussed with Dr. Juleen China and cardiology PA. All the records  are reviewed and case discussed with Care Management/Social Workerr. Management plans discussed with the patient, family and they are in agreement. Greater than 50% time was spent on coordination of care and face-to-face counseling.  CODE STATUS: Full code  TOTAL TIME TAKING CARE OF THIS PATIENT: 43 minutes.   POSSIBLE D/C IN 3 DAYS, DEPENDING ON CLINICAL CONDITION.   Demetrios Loll M.D on 03/27/2015 at 2:10 PM  Between 7am to 6pm - Pager - [phone]  After 6pm go to [email or web address] - password [email or web address] [facility]  Office  [phone]  CC: Primary care physician; Murlean Iba, MD

## 2015-03-27 NOTE — Progress Notes (Signed)
OT Cancellation Note  Patient Details Name: Toni Carlson MRN: [id number] DOB: 08-19-1945   Cancelled Treatment:    Reason Eval/Treat Not Completed: Other (comment)  Per chart review patient has a pending vascular surgery and neurology consults, as well as elevated troponin. Will hold Occupational Therapy  At this time.  Sharon Mt 03/27/2015, 9:52 AM

## 2015-03-27 NOTE — Plan of Care (Signed)
Problem: SLP Dysphagia Goals Goal: Misc Dysphagia Goal Pt will safely tolerate po diet of least restrictive consistency w/ no overt s/s of aspiration noted by Staff/pt/family x3 sessions.    

## 2015-03-27 NOTE — Evaluation (Signed)
Occupational Therapy Evaluation Patient Details Name: Collier Monica MRN: [id number] DOB: 1946/02/25 Today's Date: 03/27/2015    History of Present Illness This patient is a 69 year old female who came to [facility] with facial droop. 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   Clinical Impression   This patient is a 69 year old female who came to [facility] stroke symptoms listed above. She  lives in a mobile home with 4 steps to enter and bilateral rails. She had been independent with BADL and functional mobility.  She now requires assist and would benefit from Occupational Therapy for ADL/functioal mobility training and upper extremity restoration. MRI results Acute right MCA infarct without hemorrhage or mass effect. Involvement of the insula, operculum, and lateral motor strip.      Follow Up Recommendations  SNF    Equipment Recommendations       Recommendations for Other Services       Precautions / Restrictions        Mobility Bed Mobility                  Transfers Overall transfer level: Needs assistance                    Balance                                            ADL                                         General ADL Comments: Patient had been independent with BADL did her own cleaning, some assist from daughter for shopping.  She now requires assist for dressing and bathing. She doffed and donned her sock but became short of breath. Introduced her to hip kit and gave a list of vendors who supply. Hip kit may keep her from shortness of breath during lower body dressing. Also recommended shower chair and bars, and detachable shower head. Patient and family very receptive.     Vision     Perception     Praxis      Pertinent Vitals/Pain       Hand Dominance     Extremity/Trunk Assessment Upper Extremity Assessment Upper Extremity Assessment:  (R UE 4+/5  through out L UE 4-/5 through out stereognosis, light touch, sharp and temp all normal bilaterally.)   Lower Extremity Assessment Lower Extremity Assessment: Defer to PT evaluation       Communication     Cognition Arousal/Alertness: Awake/alert Behavior During Therapy: WFL for tasks assessed/performed Overall Cognitive Status: Within Functional Limits for tasks assessed                     General Comments       Exercises       Shoulder Instructions      Home Living Family/patient expects to be discharged to:: Skilled nursing facility Living Arrangements: Alone (daughter there frequently.)                                      Prior Functioning/Environment Level of Independence: Independent (Pateint had been independent with basic ADL  daughter gets  supplies.)             OT Diagnosis: Paresis   OT Problem List:     OT Treatment/Interventions: Self-care/ADL training;Therapeutic exercise;Neuromuscular education    OT Goals(Current goals can be found in the care plan section) Acute Rehab OT Goals Patient Stated Goal: To get better. OT Goal Formulation: With patient/family Time For Goal Achievement: 04/10/15 ADL Goals Pt Will Perform Lower Body Dressing:  (with out shorness of breath.)  OT Frequency: Min 1X/week   Barriers to D/C:            Co-evaluation              End of Session Equipment Utilized During Treatment:  (hip kit)  Activity Tolerance:   Patient left: in bed;with call bell/phone within reach;with bed alarm set;with family/visitor present   Time: 4580-9983 OT Time Calculation (min): 25 min Charges:  OT General Charges $OT Visit: 1 Procedure OT Evaluation $Initial OT Evaluation Tier I: 1 Procedure OT Treatments $Self Care/Home Management : 8-22 mins G-Codes:    Myrene Galas, MS/OTR/L  03/27/2015, 3:46 PM

## 2015-03-27 NOTE — Progress Notes (Signed)
ANTICOAGULATION CONSULT NOTE - Initial Consult  Pharmacy Consult for Apixaban Indication: Atrial fibrillation (new onset)  No Known Allergies  Patient Measurements: Height: 5\' 6"  (167.6 cm) Weight: 150 lb (68.04 kg) IBW/kg (Calculated) : 59.3 Heparin Dosing Weight:   Vital Signs: Temp: 98.5 F (36.9 C) (11/11 0822) Temp Source: Axillary (11/11 0541) BP: 169/73 mmHg (11/11 0822) Pulse Rate: 77 (11/11 0822)  Labs:  Recent Labs  03/26/15 1149 03/26/15 1746 03/26/15 2330 03/27/15 0534  HGB 11.5*  --   --  10.7*  HCT 36.5  --   --  34.7*  PLT 146*  --   --  127*  APTT 35  --   --   --   LABPROT 14.8  --   --   --   INR 1.14  --   --   --   CREATININE 4.88*  --   --  6.29*  TROPONINI 0.14* 0.14* 0.14*  --     Estimated Creatinine Clearance: 8.1 mL/min (by C-G formula based on Cr of 6.29).   Medical History: Past Medical History  Diagnosis Date  . Hypertension   . ESRD (end stage renal disease) ([place])     On Tue-Thur-Sat dialysis  . CHF (congestive heart failure) ([facility])     Diastolic heart failure  . Anemia   . Hyperparathyroidism, secondary renal ([place])     Medications:  Prescriptions prior to admission  Medication Sig Dispense Refill Last Dose  . amLODipine (NORVASC) 10 MG tablet Take 10 mg by mouth daily.   03/26/2015 at Unknown time  . hydrALAZINE (APRESOLINE) 25 MG tablet Take 25 mg by mouth 3 (three) times daily. 1 qam and 2 qpm   03/26/2015 at Unknown time  . losartan (COZAAR) 100 MG tablet Take 100 mg by mouth daily.   03/26/2015 at Unknown time  . NEXIUM 40 MG capsule Take 40 mg by mouth daily at 12 noon.    03/26/2015 at Unknown time  . PHOSLYRA 667 MG/5ML SOLN Take 1,334 mg by mouth 3 (three) times daily with meals.    03/26/2015 at Unknown time   Scheduled:  . amLODipine  10 mg Oral Daily  . apixaban  5 mg Oral BID  . [START ON 03/28/2015] aspirin EC  325 mg Oral Daily  . atorvastatin  40 mg Oral q1800  . calcium acetate (Phos Binder)  2,668 mg  Oral TID WC  . carvedilol  3.125 mg Oral BID WC  . docusate sodium  100 mg Oral BID  . hydrALAZINE  25 mg Oral q morning - 10a   And  . hydrALAZINE  50 mg Oral QPM  . losartan  100 mg Oral Daily  . pantoprazole  40 mg Oral BID  . sodium chloride  3 mL Intravenous Q12H    Assessment: Patient being treated for new onset nonvalvular Atrial fibrillation. Patient is a chronic dialysis patient with [facility], Thurs, Sat HD schedule  Goal of Therapy:  Reduce risk of stroke due to atrial fibrillation  Monitor platelets by anticoagulation protocol: Yes   Plan:  Will start apixaban 5 mg PO BID. Pharmacy to follow CBC and Scr while patient is on therapy.    Larene Beach, PharmD, BCPS  Clinical Pharmacist

## 2015-03-27 NOTE — Care Management Important Message (Signed)
Important Message  Patient Details  Name: Toni Carlson MRN: [id number] Date of Birth: [birth year]   Medicare Important Message Given:  Yes    Shelbie Ammons, RN 03/27/2015, 11:11 AM

## 2015-03-28 DIAGNOSIS — I481 Persistent atrial fibrillation: Secondary | ICD-10-CM

## 2015-03-28 LAB — CREATININE, SERUM
Creatinine, Ser: 7.29 mg/dL — ABNORMAL HIGH (ref 0.44–1.00)
GFR calc non Af Amer: 5 mL/min — ABNORMAL LOW (ref >60)
GFR, EST AFRICAN AMERICAN: 6 mL/min — AB (ref >60)

## 2015-03-28 LAB — HEPARIN LEVEL (UNFRACTIONATED)
HEPARIN UNFRACTIONATED: 0.19 [IU]/mL — AB (ref 0.30–0.70)
Heparin Unfractionated: 0.31 IU/mL (ref 0.30–0.70)
Heparin Unfractionated: 0.21 IU/mL — ABNORMAL LOW (ref 0.30–0.70)

## 2015-03-28 LAB — CBC
HEMATOCRIT: 32.2 % — AB (ref 35.0–47.0)
HEMOGLOBIN: 10.2 g/dL — AB (ref 12.0–16.0)
MCH: 28.5 pg (ref 26.0–34.0)
MCHC: 31.7 g/dL — ABNORMAL LOW (ref 32.0–36.0)
MCV: 90.1 fL (ref 80.0–100.0)
Platelets: 119 10*3/uL — ABNORMAL LOW (ref 150–440)
RBC: 3.58 MIL/uL — AB (ref 3.80–5.20)
RDW: 20 % — ABNORMAL HIGH (ref 11.5–14.5)
WBC: 5.3 10*3/uL (ref 3.6–11.0)

## 2015-03-28 LAB — PROTIME-INR
INR: 1.23
PROTHROMBIN TIME: 15.7 s — AB (ref 11.4–15.0)

## 2015-03-28 MED ORDER — ZOLPIDEM TARTRATE 5 MG PO TABS
2.5000 mg | ORAL_TABLET | Freq: Every evening | ORAL | Status: DC | PRN
  Administered 2015-03-31: 2.5 mg via ORAL
  Filled 2015-03-28 (×2): qty 1

## 2015-03-28 MED ORDER — HEPARIN BOLUS VIA INFUSION
2000.0000 [IU] | Freq: Once | INTRAVENOUS | Status: AC
  Administered 2015-03-28: 06:00:00 2000 [IU] via INTRAVENOUS
  Filled 2015-03-28: qty 2000

## 2015-03-28 NOTE — Progress Notes (Signed)
[facility] [place] NAME: Toni Carlson    MR#:  [id number]  DATE OF BIRTH:  12/25/1945  SUBJECTIVE:  CHIEF COMPLAINT:   Chief Complaint  Patient presents with  . Code Stroke   better slurred speech, no dysphagia. Left-sided weakness, , left facial droop.  Occasional therapist recommended skilled nursing facility placement. Physical therapist recommendations pending. Patient is wondering about her hemodialysis today  REVIEW OF SYSTEMS:  CONSTITUTIONAL: No fever, fatigue or weakness.  EYES: No blurred or double vision.  EARS, NOSE, AND THROAT: No tinnitus or ear pain.  RESPIRATORY: No cough, shortness of breath, wheezing or hemoptysis.  CARDIOVASCULAR: No chest pain, orthopnea, edema.  GASTROINTESTINAL: No nausea, vomiting, diarrhea or abdominal pain.  GENITOURINARY: No dysuria, hematuria.  ENDOCRINE: No polyuria, nocturia,  HEMATOLOGY: No anemia, easy bruising or bleeding SKIN: No rash or lesion. MUSCULOSKELETAL: No joint pain or arthritis.   NEUROLOGIC: No tingling, numbness, weakness. But has slurred speech. PSYCHIATRY: No anxiety or depression.   DRUG ALLERGIES:  No Known Allergies  VITALS:  Blood pressure 148/77, pulse 105, temperature 97.7 F (36.5 C), temperature source Oral, resp. rate 20, height 5\' 6"  (1.676 m), weight 68.04 kg (150 lb), SpO2 100 %.  PHYSICAL EXAMINATION:  GENERAL:  69 y.o.-year-old patient lying in the bed with no acute distress.  EYES: Pupils equal, round, reactive to light and accommodation. No scleral icterus. Extraocular muscles intact.  HEENT: Head atraumatic, normocephalic. Oropharynx and nasopharynx clear.  NECK:  Supple, no jugular venous distention. No thyroid enlargement, no tenderness.  LUNGS: Normal breath sounds bilaterally, no wheezing, rales,rhonchi or crepitation. No use of accessory muscles of respiration.  CARDIOVASCULAR: S1, S2 normal. No murmurs, rubs, or gallops.  ABDOMEN: Soft,  nontender, nondistended. Bowel sounds present. No organomegaly or mass.  EXTREMITIES: No pedal edema, cyanosis, or clubbing.  NEUROLOGIC: Cranial nerves II through XII are intact. Muscle strength 4/5 in all extremities. Sensation intact. Gait not checked.  PSYCHIATRIC: The patient is alert and oriented x 3.  SKIN: No obvious rash, lesion, or ulcer.    LABORATORY PANEL:   CBC  Recent Labs Lab 03/28/15 0424  WBC 5.3  HGB 10.2*  HCT 32.2*  PLT 119*   ------------------------------------------------------------------------------------------------------------------  Chemistries   Recent Labs Lab 03/26/15 1149 03/27/15 0534 03/28/15 0424  NA 141 138  --   K 3.2* 3.5  --   CL 98* 98*  --   CO2 30 25  --   GLUCOSE 100* 99  --   BUN 30* 47*  --   CREATININE 4.88* 6.29* 7.29*  CALCIUM 9.7 9.7  --   AST 18  --   --   ALT 13*  --   --   ALKPHOS 294*  --   --   BILITOT 0.8  --   --    ------------------------------------------------------------------------------------------------------------------  Cardiac Enzymes  Recent Labs Lab 03/26/15 2330  TROPONINI 0.14*   ------------------------------------------------------------------------------------------------------------------  RADIOLOGY:  Mr Virgel Paling Wo Contrast  03/26/2015  ADDENDUM REPORT: 03/26/2015 14:41 ADDENDUM: Study discussed by telephone with Dr. Gladstone Lighter on 03/26/2015 at 1410 hours. Electronically Signed   By: Genevie Ann M.D.   On: 03/26/2015 14:41  03/26/2015  CLINICAL DATA:  69 year old female with left facial droop noted upon arrival to dialysis this morning. Symptoms not improved. Initial encounter. EXAM: MRI HEAD WITHOUT CONTRAST MRA HEAD WITHOUT CONTRAST TECHNIQUE: Multiplanar, multiecho pulse sequences of the brain and surrounding structures were obtained without intravenous contrast. Angiographic  images of the head were obtained using MRA technique without contrast. COMPARISON:  Head CT without  contrast 1142 hours today. FINDINGS: MRI HEAD FINDINGS 2-3 cm area of restricted diffusion in the right MCA territory affecting the insula and operculum primarily, but with additional scattered cortically based and subcortical white matter infarcts tracking cephalad into the right motor strip (series 100, image 38 and series 102, image 26). Associated T2 and FLAIR hyperintensity with no hemorrhage or mass effect. No contralateral or posterior fossa restricted diffusion. Major intracranial vascular flow voids are within normal limits. Superimposed bilateral patchy and confluent cerebral white matter T2 and FLAIR hyperintensity. No chronic cerebral blood products or cortical encephalomalacia. There is a small chronic lacunar infarct in the right thalamus. Brainstem and cerebellum within normal limits. No midline shift, mass effect, evidence of mass lesion, ventriculomegaly, extra-axial collection or acute intracranial hemorrhage. Cervicomedullary junction and pituitary are within normal limits. Bilateral mastoid effusions. Negative nasopharynx. Trace paranasal sinus mucosal thickening. Rightward gaze deviation. Negative paranasal sinuses. Negative scalp soft tissues. Degenerative changes in the cervical spine. Decreased T1 bone marrow signal may be related to renal osteodystrophy. MRA HEAD FINDINGS Study is mildly degraded by motion artifact. Absent antegrade flow signal in the distal left vertebral artery. Antegrade flow in the right vertebral artery and basilar. No basilar stenosis. SCA and left PCA origins are patent. Fetal type right PCA origin. Diminutive or absent left posterior communicating artery. The right PCA appears occluded in the P2 segment. The left PCA is irregular but patent. Antegrade flow in both ICA siphons. No siphon stenosis. Infundibulum at the right ophthalmic artery origin suspected. Patent carotid termini. Patent MCA and ACA origins. 2-3 mm medially and posteriorly directed aneurysm arising  from the left 81 a-82 junction (series 2, image 98 and series 10007, image 9. Motion artifact degrades detail of the a 2 branches which appear patent. Left MCA M1 segment and bifurcation are patent. No left MCA branch occlusion identified. Right MCA M1 segment is patent. There is irregularity and high-grade stenosis at the right MCA bifurcation. Posterior sylvian divisions appear to remain patent. There is a flow gap at the origin of the dominant anterior division with reconstituted flow (series 107, image 12). IMPRESSION: 1. 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 Involvement of the insula, operculum, and lateral motor strip. 2. Evidence of emergent large vessel occlusion at the right MCA bifurcation, mostly affecting the dominant anterior M2 branch with reconstituted flow distally. 3. 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 4. 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 5. Moderate cerebral white matter signal changes and small chronic lacunar infarct in the right thalamus compatible with chronic small vessel disease. Electronically Signed: By: Genevie Ann M.D. On: 03/26/2015 14:04   Mr Brain Wo Contrast  03/26/2015  ADDENDUM REPORT: 03/26/2015 14:41 ADDENDUM: Study discussed by telephone with Dr. Gladstone Lighter on 03/26/2015 at 1410 hours. Electronically Signed   By: Genevie Ann M.D.   On: 03/26/2015 14:41  03/26/2015  CLINICAL DATA:  69 year old female with left facial droop noted upon arrival to dialysis this morning. Symptoms not improved. Initial encounter. EXAM: MRI HEAD WITHOUT CONTRAST MRA HEAD WITHOUT CONTRAST TECHNIQUE: Multiplanar, multiecho pulse sequences of the brain and surrounding structures were obtained without intravenous contrast. Angiographic images of the head were obtained using MRA technique without  contrast. COMPARISON:  Head CT without contrast 1142 hours today. FINDINGS: MRI HEAD FINDINGS 2-3 cm area of restricted diffusion in the right MCA territory affecting the insula and operculum primarily, but with additional scattered cortically based and subcortical white matter infarcts tracking cephalad into the right motor strip (series 100, image 38 and series 102, image 26). Associated T2 and FLAIR hyperintensity with no hemorrhage or mass effect. No contralateral or posterior fossa restricted diffusion. Major intracranial vascular flow voids are within normal limits. Superimposed bilateral patchy and confluent cerebral white matter T2 and FLAIR hyperintensity. No chronic cerebral blood products or cortical encephalomalacia. There is a small chronic lacunar infarct in the right thalamus. Brainstem and cerebellum within normal limits. No midline shift, mass effect, evidence of mass lesion, ventriculomegaly, extra-axial collection or acute intracranial hemorrhage. Cervicomedullary junction and pituitary are within normal limits. Bilateral mastoid effusions. Negative nasopharynx. Trace paranasal sinus mucosal thickening. Rightward gaze deviation. Negative paranasal sinuses. Negative scalp soft tissues. Degenerative changes in the cervical spine. Decreased T1 bone marrow signal may be related to renal osteodystrophy. MRA HEAD FINDINGS Study is mildly degraded by motion artifact. Absent antegrade flow signal in the distal left vertebral artery. Antegrade flow in the right vertebral artery and basilar. No basilar stenosis. SCA and left PCA origins are patent. Fetal type right PCA origin. Diminutive or absent left posterior communicating artery. The right PCA appears occluded in the P2 segment. The left PCA is irregular but patent. Antegrade flow in both ICA siphons. No siphon stenosis. Infundibulum at the right ophthalmic artery origin suspected. Patent carotid termini. Patent MCA and ACA origins. 2-3 mm medially and  posteriorly directed aneurysm arising from the left 81 a-82 junction (series 2, image 98 and series 10007, image 9. Motion artifact degrades detail of the a 2 branches which appear patent. Left MCA M1 segment and bifurcation are patent. No left MCA branch occlusion identified. Right MCA M1 segment is patent. There is irregularity and high-grade stenosis at the right MCA bifurcation. Posterior sylvian divisions appear to remain patent. There is a flow gap at the origin of the dominant anterior division with reconstituted flow (series 107, image 12). IMPRESSION: 1. 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 Involvement of the insula, operculum, and lateral motor strip. 2. Evidence of emergent large vessel occlusion at the right MCA bifurcation, mostly affecting the dominant anterior M2 branch with reconstituted flow distally. 3. 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 4. 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 5. Moderate cerebral white matter signal changes and small chronic lacunar infarct in the right thalamus compatible with chronic small vessel disease. Electronically Signed: By: Genevie Ann M.D. On: 03/26/2015 14:04   US Carotid Bilateral  03/26/2015  CLINICAL DATA:  Cerebral infarction, personal history of hypertension, CHF, end-stage renal disease, smoking EXAM: BILATERAL CAROTID DUPLEX ULTRASOUND TECHNIQUE: Pearline Cables scale imaging, color Doppler and duplex ultrasound were performed of bilateral carotid and vertebral arteries in the neck. COMPARISON:  None FINDINGS: Criteria: Quantification of carotid stenosis is based on velocity parameters that correlate the residual internal carotid diameter with NASCET-based stenosis levels, using the diameter of the distal internal carotid lumen as the denominator for stenosis measurement. The  following velocity measurements were obtained: RIGHT ICA:  167/43 cm/sec CCA:  0000000 cm/sec SYSTOLIC ICA/CCA RATIO:  [id number] DIASTOLIC ICA/CCA RATIO:  [id number] ECA:  77 cm/sec LEFT ICA:  120/32 cm/sec CCA:  [id number] cm/sec SYSTOLIC ICA/CCA RATIO:  [id number] DIASTOLIC ICA/CCA RATIO:  [id number] ECA:  144 cm/sec RIGHT CAROTID ARTERY: Intimal thickening RIGHT CCA. Small echogenic non shadowing plaque RIGHT carotid bulb. Additional shadowing plaque at distal carotid bulb. Laminar flow on color Doppler imaging. Spectral broadening RIGHT ICA on waveform analysis. No discrete high velocity jet. Mildly tortuous RIGHT carotid system. RIGHT VERTEBRAL ARTERY:  Patent, antegrade LEFT CAROTID ARTERY: Mildly tortuous LEFT carotid system. Intimal thickening LEFT CCA. Echogenic shadowing and non shadowing plaque LEFT carotid bulb extending in the proximal LEFT ICA. Turbulent flow on color Doppler imaging within LEFT ICA. Spectral broadening LEFT ICA on waveform analysis. No high velocity jets. LEFT VERTEBRAL ARTERY:  Patent, antegrade IMPRESSION: Plaque formation at the carotid bifurcations bilaterally associated with turbulent flow at the proximal LEFT ICA. Velocity measurements correspond to a 50-69% diameter stenosis of the RIGHT carotid and a less than 50% diameter stenosis of the LEFT carotid. Electronically Signed   By: Lavonia Dana M.D.   On: 03/26/2015 15:20    EKG:   Orders placed or performed during the hospital encounter of 03/26/15  . ED EKG  . ED EKG  . EKG 12-Lead  . EKG 12-Lead  . EKG 12-Lead  . EKG 12-Lead  . EKG 12-Lead  . EKG 12-Lead    ASSESSMENT AND PLAN:   #1 Acute CVA with left-sided weakness.  -CT of the head with right frontotemporal infarct. MRI: right MCA infart. Passed speech study,  follow-up physical therapy consult and occupational therapy consult. Carotid Dopplers showed left side carotid artery stenosis 50-69%; vascular surgery recommend follow-up as outpatient echocardiogram: EF 60% to 65%. Continue  aspirin and Lipitor. Neurology recommends anticoagulation with Coumadin for atrial fibrillation, vascular surgery, no intervention, but follow-up as outpatient  #2 end-stage renal disease-on Tuesday Thursday Saturday hemodialysis. Continue PhosLo and Renvela.  #3 hypertension. Continue Norvasc, losartan and hydralazine.   #4 anemia of chronic disease-hemoglobin is stable.  #5*New onset of A. Fib. Rate is controlled. Continue Coreg 3.125 mg twice a day. Aspirin for now and need [place]-term full dose anticoagulation with Coumadin to be started after neurology consult per cardiology consult.  *  mild elevated troponin. Due to CVA or ESRD.   All the records are reviewed and case discussed with Care Management/Social Workerr. Management plans discussed with the patient, family and they are in agreement. Greater than 50% time was spent on coordination of care and face-to-face counseling.  CODE STATUS: Full code  TOTAL TIME TAKING CARE OF THIS PATIENT: 35 minutes.   POSSIBLE D/C IN 3 DAYS, DEPENDING ON CLINICAL CONDITION.   Theodoro Grist M.D on 03/28/2015 at 1:48 PM  Between 7am to 6pm - Pager - [phone]  After 6pm go to [email or web address] - password [email or web address] [facility]  Office  [phone]  CC: Primary care physician; Murlean Iba, MD

## 2015-03-28 NOTE — Progress Notes (Signed)
ANTICOAGULATION CONSULT NOTE - Follow Up  Pharmacy Consult for heparin/coumadin Indication: atrial fibrillation  No Known Allergies  Patient Measurements: Height: 5\' 6"  (167.6 cm) Weight: 150 lb (68.04 kg) IBW/kg (Calculated) : 59.3 Heparin Dosing Weight: 68kg  Vital Signs: Temp: 97.8 F (36.6 C) (11/12 1413) Temp Source: Oral (11/12 1413) BP: 127/65 mmHg (11/12 1413) Pulse Rate: 71 (11/12 1413)  Labs:  Recent Labs  03/26/15 1149 03/26/15 1746 03/26/15 2330 03/27/15 0534 03/27/15 1728 03/28/15 0424 03/28/15 1359  HGB 11.5*  --   --  10.7*  --  10.2*  --   HCT 36.5  --   --  34.7*  --  32.2*  --   PLT 146*  --   --  127*  --  119*  --   APTT 35  --   --   --  35  --   --   LABPROT 14.8  --   --   --  15.1* 15.7*  --   INR 1.14  --   --   --  1.17 1.23  --   HEPARINUNFRC  --   --   --   --   --  0.19* 0.31  CREATININE 4.88*  --   --  6.29*  --  7.29*  --   TROPONINI 0.14* 0.14* 0.14*  --   --   --   --     Estimated Creatinine Clearance: 7 mL/min (by C-G formula based on Cr of 7.29).   Medical History: Past Medical History  Diagnosis Date  . Hypertension   . ESRD (end stage renal disease) ([place])     On Tue-Thur-Sat dialysis  . CHF (congestive heart failure) ([facility])     Diastolic heart failure  . Anemia   . Hyperparathyroidism, secondary renal [facility])    Assessment: Pharmacy consulted to dose heparin as a bridge to warfarin for afib in this 69 year old female with ESRD on dialysis.  Goal of Therapy:  Heparin level 0.3-0.7 units/ml INR 2-3   Plan:  1112 0424 heparin level subtherapeutic. 2000 unit IV bolus x 1 and increase rate to 1250 units/hr. Will recheck heparin level in 8 hours.   11/12: Heparin level resulted @ 0.31, which is therapeutic. Will continue current rate of heparin 1250 units/hr. Next Anti xa level to be drawn @ 2200  Warfarin: Continue warfarin 5 mg PO q1800.   Deshea Pooley D, Pharm.D.  Clinical Pharmacist 03/28/2015,3:19 PM

## 2015-03-28 NOTE — Progress Notes (Signed)
SUBJECTIVE:  No SOB.  Feels OK   PHYSICAL EXAM Filed Vitals:   03/27/15 1256 03/27/15 2128 03/28/15 0132 03/28/15 0518  BP: 128/54 137/66 154/81 148/77  Pulse: 67 90 64 105  Temp: 97.7 F (36.5 C) 97.8 F (36.6 C)  97.7 F (36.5 C)  TempSrc: Oral Oral  Oral  Resp: 19 20 20 20   Height:      Weight:      SpO2: 99% 98% 100% 100%   General:  No distress Lungs:  Clear Heart:  Irregular Abdomen:  Positive bowel sounds, no rebound no guarding Extremities:  No edema  LABS:  Results for orders placed or performed during the hospital encounter of 03/26/15 (from the past 24 hour(s))  Protime-INR     Status: Abnormal   Collection Time: 03/27/15  5:28 PM  Result Value Ref Range   Prothrombin Time 15.1 (H) 11.4 - 15.0 seconds   INR 1.17   APTT     Status: None   Collection Time: 03/27/15  5:28 PM  Result Value Ref Range   aPTT 35 24 - 36 seconds  CBC     Status: Abnormal   Collection Time: 03/28/15  4:24 AM  Result Value Ref Range   WBC 5.3 3.6 - 11.0 K/uL   RBC 3.58 (L) 3.80 - 5.20 MIL/uL   Hemoglobin 10.2 (L) 12.0 - 16.0 g/dL   HCT 32.2 (L) 35.0 - 47.0 %   MCV 90.1 80.0 - 100.0 fL   MCH 28.5 26.0 - 34.0 pg   MCHC 31.7 (L) 32.0 - 36.0 g/dL   RDW 20.0 (H) 11.5 - 14.5 %   Platelets 119 (L) 150 - 440 K/uL  Creatinine, serum     Status: Abnormal   Collection Time: 03/28/15  4:24 AM  Result Value Ref Range   Creatinine, Ser 7.29 (H) 0.44 - 1.00 mg/dL   GFR calc non Af Amer 5 (L) >60 mL/min   GFR calc Af Amer 6 (L) >60 mL/min  Protime-INR     Status: Abnormal   Collection Time: 03/28/15  4:24 AM  Result Value Ref Range   Prothrombin Time 15.7 (H) 11.4 - 15.0 seconds   INR 1.23   Heparin level (unfractionated)     Status: Abnormal   Collection Time: 03/28/15  4:24 AM  Result Value Ref Range   Heparin Unfractionated 0.19 (L) 0.30 - 0.70 IU/mL    Intake/Output Summary (Last 24 hours) at 03/28/15 1051 Last data filed at 03/28/15 0900  Gross per 24 hour  Intake   985.7 ml  Output      0 ml  Net  985.7 ml    ECHO:    - Left ventricle: The cavity size was normal. There was severe concentric hypertrophy. Systolic function was normal. The estimated ejection fraction was in the range of 60% to 65%. Wall motion was normal; there were no regional wall motion abnormalities. Doppler parameters are consistent with restrictive physiology, indicative of decreased left ventricular diastolic compliance and/or increased left atrial pressure. - Aortic valve: There was mild stenosis. Mean gradient (S): 10 mm Hg. - Mitral valve: Calcified annulus. The findings are consistent with mild stenosis. There was mild regurgitation. Mean gradient (D): 6 mm Hg. Valve area by pressure half-time: 2.72 cm^2. - Left atrium: The atrium was severely dilated. - Right ventricle: The cavity size was mildly dilated. Wall thickness was moderately increased. - Right atrium: The atrium was severely dilated. - Tricuspid valve: There was severe regurgitation. -  Pulmonary arteries: Systolic pressure was severely increased. PA peak pressure: 90 mm Hg (S). - Inferior vena cava: The vessel was severely dilated. Respirophasic changes in dimension were absent, severely elevated central venous pressure. - Pericardium, extracardiac: A small pericardial effusion was identified posterior to the heart and circumferential to the heart. There was no evidence of hemodynamic compromise.  ASSESSMENT AND PLAN:  CVA (cerebral infarction):  Per primary team.  Embolic related to new diagnosis of atrial fib.    Atrial fibrillation [facility]):   Telemetry reviewed today. Rate OK  Patient is now on warfarin and heparin.     Elevated troponin:  The plan is for out patient Lexiscan Myoview in the future given the absence of symptoms and preserved EF.    Mitral stenosis:   Follow clinically.  Pulmonary HTN:  She has severely elevated pulmonary pressure.  The etiology is not  clear.  She will need follow up and work up of this which will likely include right heart cath and consideration of vasodilators chronically.  However, this can be deferred until she has had improvement and recovery from her acute event.   Jeneen Rinks [facility] 03/28/2015 10:51 AM

## 2015-03-28 NOTE — Progress Notes (Signed)
ANTICOAGULATION CONSULT NOTE - Initial Consult  Pharmacy Consult for heparin/coumadin Indication: atrial fibrillation  No Known Allergies  Patient Measurements: Height: 5\' 6"  (167.6 cm) Weight: 150 lb (68.04 kg) IBW/kg (Calculated) : 59.3 Heparin Dosing Weight: 68kg  Vital Signs: Temp: 97.7 F (36.5 C) (11/12 0518) Temp Source: Oral (11/12 0518) BP: 148/77 mmHg (11/12 0518) Pulse Rate: 105 (11/12 0518)  Labs:  Recent Labs  03/26/15 1149 03/26/15 1746 03/26/15 2330 03/27/15 0534 03/27/15 1728 03/28/15 0424  HGB 11.5*  --   --  10.7*  --  10.2*  HCT 36.5  --   --  34.7*  --  32.2*  PLT 146*  --   --  127*  --  119*  APTT 35  --   --   --  35  --   LABPROT 14.8  --   --   --  15.1* 15.7*  INR 1.14  --   --   --  1.17 1.23  HEPARINUNFRC  --   --   --   --   --  0.19*  CREATININE 4.88*  --   --  6.29*  --  7.29*  TROPONINI 0.14* 0.14* 0.14*  --   --   --     Estimated Creatinine Clearance: 7 mL/min (by C-G formula based on Cr of 7.29).   Medical History: Past Medical History  Diagnosis Date  . Hypertension   . ESRD (end stage renal disease) ([place])     On Tue-Thur-Sat dialysis  . CHF (congestive heart failure) ([facility])     Diastolic heart failure  . Anemia   . Hyperparathyroidism, secondary renal [facility])    Assessment: Pharmacy consulted to dose heparin as a bridge to warfarin for afib in this 69 year old female with ESRD on dialysis.  Goal of Therapy:  Heparin level 0.3-0.7 units/ml INR 2-3   Plan:  1112 0424 heparin level subtherapeutic. 2000 unit IV bolus x 1 and increase rate to 1250 units/hr. Will recheck heparin level in 8 hours.   Laural Benes, Pharm.D.  Clinical Pharmacist 03/28/2015,5:32 AM

## 2015-03-28 NOTE — Progress Notes (Signed)
[facility]  ROUNDING NOTE   Subjective:  Pt seen at bedside. Due for HD today.  HR currently 71.     Objective:  Vital signs in last 24 hours:  Temp:  [97.7 F (36.5 C)-97.8 F (36.6 C)] 97.8 F (36.6 C) (11/12 1413) Pulse Rate:  [64-105] 71 (11/12 1413) Resp:  [20] 20 (11/12 0518) BP: (127-154)/(65-81) 127/65 mmHg (11/12 1413) SpO2:  [98 %-100 %] 98 % (11/12 1413)  Weight change:  Filed Weights   03/26/15 1128  Weight: 68.04 kg (150 lb)    Intake/Output: I/O last 3 completed shifts: In: 985.7 [P.O.:835; I.V.:150.7] Out: 50 [Urine:50]   Intake/Output this shift:  Total I/O In: 480 [P.O.:480] Out: -   Physical Exam: General: NAD  Head: Normocephalic, atraumatic. Moist oral mucosal membranes  Eyes: Anicteric  Neck: Supple, trachea midline  Lungs:  Clear to auscultation normal effort  Heart: Irregular no rubs  Abdomen:  Soft, nontender, BS present   Extremities: no peripheral edema.  Neurologic: Left sided weakness, left sided facial droop  Skin: No lesions  Access: Left arm AVF    Basic Metabolic Panel:  Recent Labs Lab 03/26/15 1149 03/27/15 0534 03/28/15 0424  NA 141 138  --   K 3.2* 3.5  --   CL 98* 98*  --   CO2 30 25  --   GLUCOSE 100* 99  --   BUN 30* 47*  --   CREATININE 4.88* 6.29* 7.29*  CALCIUM 9.7 9.7  --     Liver Function Tests:  Recent Labs Lab 03/26/15 1149  AST 18  ALT 13*  ALKPHOS 294*  BILITOT 0.8  PROT 7.2  ALBUMIN 3.4*   No results for input(s): LIPASE, AMYLASE in the last 168 hours. No results for input(s): AMMONIA in the last 168 hours.  CBC:  Recent Labs Lab 03/26/15 1149 03/27/15 0534 03/28/15 0424  WBC 5.6 4.4 5.3  NEUTROABS 4.6  --   --   HGB 11.5* 10.7* 10.2*  HCT 36.5 34.7* 32.2*  MCV 89.2 89.9 90.1  PLT 146* 127* 119*    Cardiac Enzymes:  Recent Labs Lab 03/26/15 1149 03/26/15 1746 03/26/15 2330  TROPONINI 0.14* 0.14* 0.14*    BNP: Invalid input(s): POCBNP  CBG: No  results for input(s): GLUCAP in the last 168 hours.  Microbiology: Results for orders placed or performed in visit on 09/19/11  MRSA PCR Screening     Status: None   Collection Time: 09/19/11 11:25 AM  Result Value Ref Range Status   Micro Text Report   Final       SOURCE: nasal    COMMENT                   NEGATIVE - MRSA target DNA not detected   ANTIBIOTIC                                                        Coagulation Studies:  Recent Labs  03/26/15 1149 03/27/15 1728 03/28/15 0424  LABPROT 14.8 15.1* 15.7*  INR 1.14 1.17 1.23    Urinalysis: No results for input(s): COLORURINE, LABSPEC, PHURINE, GLUCOSEU, HGBUR, BILIRUBINUR, KETONESUR, PROTEINUR, UROBILINOGEN, NITRITE, LEUKOCYTESUR in the last 72 hours.  Invalid input(s): APPERANCEUR    Imaging: No results found.   Medications:   . heparin  1,250 Units/hr (03/28/15 1209)   . amLODipine  10 mg Oral Daily  . aspirin EC  325 mg Oral Daily  . atorvastatin  40 mg Oral q1800  . calcium acetate (Phos Binder)  2,668 mg Oral TID WC  . carvedilol  3.125 mg Oral BID WC  . docusate sodium  100 mg Oral BID  . hydrALAZINE  50 mg Oral 3 times per day  . losartan  100 mg Oral Daily  . pantoprazole  40 mg Oral BID  . sodium chloride  3 mL Intravenous Q12H  . warfarin  5 mg Oral q1800  . Warfarin - Pharmacist Dosing Inpatient   Does not apply q1800   acetaminophen **OR** acetaminophen, ondansetron **OR** ondansetron (ZOFRAN) IV, polyethylene glycol, trolamine salicylate, zolpidem  Assessment/ Plan:  [name] is a 69 y.o. black female with hypertension, GERD, ESRD  [name]  1. ESRD: completed hemodialysis treatment.  Pt due for HD today, orders prepared, continue HD on TTHS scheduled.    2. Acute CVA: Right MCA infarction. Due to new onset atrial fibrillation?  - on heparin gtt, as well as warfarin.  HR controlled on coreg.   3. Anemia of chronic kidney disease: hgb 10.2, epogen  on hold given ischemic event.  4. Secondary Hyperparathyroidism: PTH 4769. Does not tolerate cinacalcet - on high dose hectorol as outpatient.  - continue phoslo.  5. Hypertension: well controlled.   - amlodipine, hydralazine, coreg, and losartan.   LOS: 2 Toni Carlson 11/12/20163:28 PM

## 2015-03-28 NOTE — Progress Notes (Signed)
Took over care of patient from Mallie Snooks at 1500. Madlyn Frankel, RN

## 2015-03-28 NOTE — Evaluation (Signed)
Physical Therapy Evaluation Patient Details Name: Toni Carlson MRN: [id number] DOB: March 14, 1946 Today's Date: 03/28/2015   History of Present Illness  This patient is a 69 year old female who came to [facility] with facial droop. 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  Clinical Impression  Pt presents with hx of HTN, ESRD, CHF, and anemia. Examination reveals that pt performs bed mobility at min A, transfers at min A, and ambulation of 50 ft at [facility]. Pt has some noted weakness in LUE as compared to RUE, and no apparent functional deficits in the LLE. Sensation and coordination intact bilaterally. Pt's mobility is slow and with effort, however she is stable and understands her limitations. In order for pt to be totally safe at home, she will need a BSC, shower chair, and RW. Due to her strength and endurance deficits she will continue to benefit from skilled PT in order to return her to premorbid state. Pt is pleasant and motivated to participate in therapy tasks.     Follow Up Recommendations Home health PT;Supervision for mobility/OOB    Equipment Recommendations  Rolling walker with 5" wheels (BSC, shower chair)    Recommendations for Other Services OT consult     Precautions / Restrictions Precautions Precautions: Fall Restrictions Weight Bearing Restrictions: No      Mobility  Bed Mobility Overal bed mobility: Needs Assistance Bed Mobility: Supine to Sit     Supine to sit: Min assist     General bed mobility comments: Pt requires minor assist to get her trunk upright but she demonstrates ability to use her hands for scooting to EOB  Transfers Overall transfer level: Needs assistance Equipment used: Rolling walker (2 wheeled) Transfers: Sit to/from Stand Sit to Stand: Min assist         General transfer comment: Pt requires assist to get fully into standing but shows stability with rising and in standing. No buckling noted in LLE.    Ambulation/Gait Ambulation/Gait assistance: Min guard Ambulation Distance (Feet): 50 Feet Assistive device: Rolling walker (2 wheeled) Gait Pattern/deviations: Step-through pattern;Decreased step length - right;Decreased step length - left;Decreased stride length;Shuffle Gait velocity: decreased Gait velocity interpretation: Below normal speed for age/gender General Gait Details: Pt with slow, shuffle-like gait that is stable, however after about 50 ft she claimis to wish to sit down secondary to "arthritic" pain in LLE that she's had for a couple years. Pt is stable with no LOB.  Stairs            Wheelchair Mobility    Modified Rankin (Stroke Patients Only)       Balance Overall balance assessment: History of Falls (1 fall in shower recently )                                           Pertinent Vitals/Pain Pain Assessment: No/denies pain    Home Living Family/patient expects to be discharged to:: Private residence Living Arrangements: Alone Available Help at Discharge: Family;Available 24 hours/day Type of Home: Mobile home Home Access: Stairs to enter Entrance Stairs-Rails: Can reach both Entrance Stairs-Number of Steps: 3 Home Layout: One level Home Equipment: Cane - single point Additional Comments: Daughter is able to provide pt assistance at home after d/c    Prior Function Level of Independence: Independent         Comments: Pt was indep with ambulation and  ADLs and still driving. Ambulating community distances      Hand Dominance        Extremity/Trunk Assessment   Upper Extremity Assessment: LUE deficits/detail       LUE Deficits / Details: Pt demonstrates 3+/5 MMT gross LUE strength (RUE WFL for strength)   Lower Extremity Assessment: Overall WFL for tasks assessed;LLE deficits/detail   LLE Deficits / Details: Difficult to assess secondary to arthritic pain  (Determined through functional strength in standing)      Communication   Communication: No difficulties  Cognition Arousal/Alertness: Awake/alert Behavior During Therapy: WFL for tasks assessed/performed Overall Cognitive Status: Within Functional Limits for tasks assessed                      General Comments      Exercises Other Exercises Other Exercises: Pt performed bilateral therex x 12 reps at supervision for proper technique. Exercises included: ankle pumps, SLR, hip abd, closed chain plantar flexion, press ups from chair, and scap retraction      Assessment/Plan    PT Assessment Patient needs continued PT services  PT Diagnosis Abnormality of gait   PT Problem List Decreased strength;Decreased activity tolerance;Decreased balance;Pain;Decreased knowledge of use of DME  PT Treatment Interventions DME instruction;Stair training;Gait training;Functional mobility training;Therapeutic activities;Therapeutic exercise;Balance training;Neuromuscular re-education   PT Goals (Current goals can be found in the Care Plan section) Acute Rehab PT Goals Patient Stated Goal: to go home PT Goal Formulation: With patient Time For Goal Achievement: 04/11/15 Potential to Achieve Goals: Good    Frequency 7X/week   Barriers to discharge        Co-evaluation               End of Session Equipment Utilized During Treatment: Gait belt Activity Tolerance: Patient tolerated treatment well;Patient limited by pain Patient left: in chair;with call bell/phone within reach;with chair alarm set Nurse Communication: Mobility status         Time: [id number] PT Time Calculation (min) (ACUTE ONLY): 24 min   Charges:         PT G CodesJanyth Contes 2015-04-09, 10:52 AM Janyth Contes, SPT. [phone]

## 2015-03-28 NOTE — Consult Note (Signed)
CC: slurry speech   HPI: Toni Carlson is an 69 y.o. female end-stage renal disease on Tuesday Thursday Saturday hemodialysis, hypertension, anemia of chronic disease presents to the hospital secondary to slurred speech and also facial droop noticed at dialysis. Currently pt is back to baseline.   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 Pt has also been found to be in A-fib  Past Medical History  Diagnosis Date  . Hypertension   . ESRD (end stage renal disease) ([place])     On Tue-Thur-Sat dialysis  . CHF (congestive heart failure) (H[facility])     Diastolic heart failure  . Anemia   . Hyperparathyroidism, secondary renal [facility])     Past Surgical History  Procedure Laterality Date  . Peripheral vascular catheterization N/A 02/23/2015    Procedure: A/V Shuntogram/Fistulagram;  Surgeon: Algernon Huxley, MD;  Location: [place] [facility];  Service: Cardiovascular;  Laterality: N/A;  . Peripheral vascular catheterization N/A 02/23/2015    Procedure: A/V Shunt Intervention;  Surgeon: Algernon Huxley, MD;  Location: [place] [facility];  Service: Cardiovascular;  Laterality: N/A;    Family History  Problem Relation Age of Onset  . CAD Father   . Dementia Mother     Social History:  reports that she has been smoking Cigarettes.  She has a 7.5 pack-year smoking history. She has never used smokeless tobacco. She reports that she does not drink alcohol or use illicit drugs.  No Known Allergies  Medications: I have reviewed the patient's current medications.  ROS: History obtained from the patient  General ROS: negative for - chills, fatigue, fever, night sweats, weight gain or weight loss Psychological ROS: negative for - behavioral disorder, hallucinations, memory difficulties, mood swings or suicidal ideation Ophthalmic ROS: negative for - blurry vision, double vision, eye pain or loss of vision ENT ROS: negative for - epistaxis, nasal discharge, oral lesions, sore  throat, tinnitus or vertigo Allergy and Immunology ROS: negative for - hives or itchy/watery eyes Hematological and Lymphatic ROS: negative for - bleeding problems, bruising or swollen lymph nodes Endocrine ROS: negative for - galactorrhea, hair pattern changes, polydipsia/polyuria or temperature intolerance Respiratory ROS: negative for - cough, hemoptysis, shortness of breath or wheezing Cardiovascular ROS: negative for - chest pain, dyspnea on exertion, edema or irregular heartbeat Gastrointestinal ROS: negative for - abdominal pain, diarrhea, hematemesis, nausea/vomiting or stool incontinence Genito-Urinary ROS: Positive ESRD  Musculoskeletal ROS: negative for - joint swelling or muscular weakness Neurological ROS: as noted in HPI Dermatological ROS: negative for rash and skin lesion changes  Physical Examination: Blood pressure 127/65, pulse 71, temperature 97.8 F (36.6 C), temperature source Oral, resp. rate 20, height 5\' 6"  (1.676 m), weight 150 lb (68.04 kg), SpO2 98 %.   Neurological Examination Mental Status: Alert, oriented, thought content appropriate.  Speech fluent without evidence of aphasia.  Able to follow 3 step commands without difficulty. Cranial Nerves: II: Discs flat bilaterally; Visual fields grossly normal, pupils equal, round, reactive to light and accommodation III,IV, VI: ptosis not present, extra-ocular motions intact bilaterally V,VII: smile symmetric, facial light touch sensation normal bilaterally VIII: hearing normal bilaterally IX,X: gag reflex present XI: bilateral shoulder shrug XII: midline tongue extension Motor: Right : Upper extremity   4+/5    Left:     Upper extremity   4+/5  Lower extremity   4+/5     Lower extremity   4+/5 Tone and bulk:normal tone throughout; no atrophy noted  Sensory: Pinprick and light touch intact throughout, bilaterally Deep Tendon Reflexes: 1+ and symmetric throughout Plantars: Right: downgoing   Left:  downgoing Cerebellar: normal finger-to-nose, normal rapid alternating movements and normal heel-to-shin test Gait: not tested       Laboratory Studies:   Basic Metabolic Panel:  Recent Labs Lab 03/26/15 1149 03/27/15 0534 03/28/15 0424  NA 141 138  --   K 3.2* 3.5  --   CL 98* 98*  --   CO2 30 25  --   GLUCOSE 100* 99  --   BUN 30* 47*  --   CREATININE 4.88* 6.29* 7.29*  CALCIUM 9.7 9.7  --     Liver Function Tests:  Recent Labs Lab 03/26/15 1149  AST 18  ALT 13*  ALKPHOS 294*  BILITOT 0.8  PROT 7.2  ALBUMIN 3.4*   No results for input(s): LIPASE, AMYLASE in the last 168 hours. No results for input(s): AMMONIA in the last 168 hours.  CBC:  Recent Labs Lab 03/26/15 1149 03/27/15 0534 03/28/15 0424  WBC 5.6 4.4 5.3  NEUTROABS 4.6  --   --   HGB 11.5* 10.7* 10.2*  HCT 36.5 34.7* 32.2*  MCV 89.2 89.9 90.1  PLT 146* 127* 119*    Cardiac Enzymes:  Recent Labs Lab 03/26/15 1149 03/26/15 1746 03/26/15 2330  TROPONINI 0.14* 0.14* 0.14*    BNP: Invalid input(s): POCBNP  CBG: No results for input(s): GLUCAP in the last 168 hours.  Microbiology: Results for orders placed or performed in visit on 09/19/11  MRSA PCR Screening     Status: None   Collection Time: 09/19/11 11:25 AM  Result Value Ref Range Status   Micro Text Report   Final       SOURCE: nasal    COMMENT                   NEGATIVE - MRSA target DNA not detected   ANTIBIOTIC                                                        Coagulation Studies:  Recent Labs  03/26/15 1149 03/27/15 1728 03/28/15 0424  LABPROT 14.8 15.1* 15.7*  INR 1.14 1.17 1.23    Urinalysis: No results for input(s): COLORURINE, LABSPEC, PHURINE, GLUCOSEU, HGBUR, BILIRUBINUR, KETONESUR, PROTEINUR, UROBILINOGEN, NITRITE, LEUKOCYTESUR in the last 168 hours.  Invalid input(s): APPERANCEUR  Lipid Panel:     Component Value Date/Time   CHOL 171 03/26/2015 1746   CHOL 176 08/23/2011 0641   TRIG  79 03/26/2015 1746   TRIG 53 08/23/2011 0641   HDL 48 03/26/2015 1746   HDL 65* 08/23/2011 0641   CHOLHDL 3.6 03/26/2015 1746   VLDL 16 03/26/2015 1746   VLDL 11 08/23/2011 0641   LDLCALC 107* 03/26/2015 1746   LDLCALC 100 08/23/2011 0641    HgbA1C:  Lab Results  Component Value Date   HGBA1C 5.6 03/27/2015    Urine Drug Screen:  No results found for: LABOPIA, COCAINSCRNUR, LABBENZ, AMPHETMU, THCU, LABBARB  Alcohol Level: No results for input(s): ETH in the last 168 hours.    Imaging: No results found.   Assessment/Plan: 69 y.o. female end-stage renal disease on Tuesday Thursday Saturday hemodialysis, hypertension, anemia of chronic disease presents to the hospital secondary to slurred speech and also facial  droop noticed at dialysis. Currently pt is back to baseline.   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 Pt has also been found to be in A-fib  Started Heparin gtt Would con't heparin at low dose with no bolus as higher risk of hemorrhagic conversion  Anticoagulation in 4 days No further imaging from neuro stand point at this time.  Leotis Pain    03/28/2015, 5:42 PM

## 2015-03-28 NOTE — Progress Notes (Signed)
Dialysis rescheduled to tomorrow morning per dialysis. Madlyn Frankel, RN

## 2015-03-29 LAB — RENAL FUNCTION PANEL
ANION GAP: 13 (ref 5–15)
Albumin: 3 g/dL — ABNORMAL LOW (ref 3.5–5.0)
BUN: 74 mg/dL — ABNORMAL HIGH (ref 6–20)
CALCIUM: 9.4 mg/dL (ref 8.9–10.3)
CHLORIDE: 98 mmol/L — AB (ref 101–111)
CO2: 24 mmol/L (ref 22–32)
CREATININE: 8.75 mg/dL — AB (ref 0.44–1.00)
GFR calc Af Amer: 5 mL/min — ABNORMAL LOW (ref >60)
GFR calc non Af Amer: 4 mL/min — ABNORMAL LOW (ref >60)
GLUCOSE: 113 mg/dL — AB (ref 65–99)
Phosphorus: 5 mg/dL — ABNORMAL HIGH (ref 2.5–4.6)
Potassium: 4.3 mmol/L (ref 3.5–5.1)
SODIUM: 135 mmol/L (ref 135–145)

## 2015-03-29 LAB — CBC
HCT: 32.4 % — ABNORMAL LOW (ref 35.0–47.0)
HEMOGLOBIN: 9.8 g/dL — AB (ref 12.0–16.0)
MCH: 27.6 pg (ref 26.0–34.0)
MCHC: 30.2 g/dL — AB (ref 32.0–36.0)
MCV: 91.4 fL (ref 80.0–100.0)
Platelets: 128 10*3/uL — ABNORMAL LOW (ref 150–440)
RBC: 3.55 MIL/uL — ABNORMAL LOW (ref 3.80–5.20)
RDW: 20.5 % — ABNORMAL HIGH (ref 11.5–14.5)
WBC: 6.1 10*3/uL (ref 3.6–11.0)

## 2015-03-29 LAB — HEPARIN LEVEL (UNFRACTIONATED)
HEPARIN UNFRACTIONATED: 0.34 [IU]/mL (ref 0.30–0.70)
Heparin Unfractionated: 0.4 IU/mL (ref 0.30–0.70)

## 2015-03-29 LAB — PROTIME-INR
INR: 1.24
PROTHROMBIN TIME: 15.8 s — AB (ref 11.4–15.0)

## 2015-03-29 MED ORDER — HEPARIN BOLUS VIA INFUSION
1000.0000 [IU] | Freq: Once | INTRAVENOUS | Status: AC
  Administered 2015-03-29: 01:00:00 1000 [IU] via INTRAVENOUS
  Filled 2015-03-29: qty 1000

## 2015-03-29 NOTE — Plan of Care (Signed)
Problem: Education: Goal: Knowledge of disease or condition will improve Outcome: Progressing Educated pt about the complications of immobility. Pt verbalized understanding. Pt up to the bsc today and dangled at the side of the bed. ROM performed. Goal: Knowledge of secondary prevention will improve Outcome: Progressing Educated pt about stroke signs and symptoms and risk factors of stroke. Pt verbalize understanding.  Educated pt about heparin drip therapy. Pt verbalized understanding. Goal: Knowledge of patient specific risk factors addressed and post discharge goals established will improve Outcome: Progressing Discussed with pt risk factors of HTN and the importance of compling with renal diet. Pt requesting more salt with meals. Explained to pt about A fib. Pt verbalized understanding.  Pt had dialysis today. 1.5L removed. No neurological changed during the shift. Denies pain.

## 2015-03-29 NOTE — Progress Notes (Signed)
[facility]  ROUNDING NOTE   Subjective:  Pt seen during HD.  Tolerating well. Couldn't dialyze yesterday due to technical issue with RO.   Objective:  Vital signs in last 24 hours:  Temp:  [97.4 F (36.3 C)-98.6 F (37 C)] 97.4 F (36.3 C) (11/13 1045) Pulse Rate:  [67-76] 73 (11/13 1100) Resp:  [18-20] 18 (11/13 1100) BP: (89-154)/(24-65) 154/58 mmHg (11/13 1100) SpO2:  [95 %-100 %] 95 % (11/13 0522) Weight:  [69.3 kg (152 lb 12.5 oz)] 69.3 kg (152 lb 12.5 oz) (11/13 1045)  Weight change:  Filed Weights   03/26/15 1128 03/29/15 1045  Weight: 68.04 kg (150 lb) 69.3 kg (152 lb 12.5 oz)    Intake/Output: I/O last 3 completed shifts: In: 870.7 [P.O.:720; I.V.:150.7] Out: -    Intake/Output this shift:     Physical Exam: General: NAD  Head: Normocephalic, atraumatic. Moist oral mucosal membranes  Eyes: Anicteric  Neck: Supple, trachea midline  Lungs:  Clear to auscultation normal effort  Heart: Irregular no rubs  Abdomen:  Soft, nontender, BS present   Extremities: trace peripheral edema.  Neurologic: Left sided weakness, left sided facial droop  Skin: No lesions  Access: Left arm AVF    Basic Metabolic Panel:  Recent Labs Lab 03/26/15 1149 03/27/15 0534 03/28/15 0424 03/29/15 0443  NA 141 138  --  135  K 3.2* 3.5  --  4.3  CL 98* 98*  --  98*  CO2 30 25  --  24  GLUCOSE 100* 99  --  113*  BUN 30* 47*  --  74*  CREATININE 4.88* 6.29* 7.29* 8.75*  CALCIUM 9.7 9.7  --  9.4  PHOS  --   --   --  5.0*    Liver Function Tests:  Recent Labs Lab 03/26/15 1149 03/29/15 0443  AST 18  --   ALT 13*  --   ALKPHOS 294*  --   BILITOT 0.8  --   PROT 7.2  --   ALBUMIN 3.4* 3.0*   No results for input(s): LIPASE, AMYLASE in the last 168 hours. No results for input(s): AMMONIA in the last 168 hours.  CBC:  Recent Labs Lab 03/26/15 1149 03/27/15 0534 03/28/15 0424 03/29/15 0443  WBC 5.6 4.4 5.3 6.1  NEUTROABS 4.6  --   --   --   HGB  11.5* 10.7* 10.2* 9.8*  HCT 36.5 34.7* 32.2* 32.4*  MCV 89.2 89.9 90.1 91.4  PLT 146* 127* 119* 128*    Cardiac Enzymes:  Recent Labs Lab 03/26/15 1149 03/26/15 1746 03/26/15 2330  TROPONINI 0.14* 0.14* 0.14*    BNP: Invalid input(s): POCBNP  CBG: No results for input(s): GLUCAP in the last 168 hours.  Microbiology: Results for orders placed or performed in visit on 09/19/11  MRSA PCR Screening     Status: None   Collection Time: 09/19/11 11:25 AM  Result Value Ref Range Status   Micro Text Report   Final       SOURCE: nasal    COMMENT                   NEGATIVE - MRSA target DNA not detected   ANTIBIOTIC  Coagulation Studies:  Recent Labs  03/26/15 1149 03/27/15 1728 03/28/15 0424 03/29/15 0443  LABPROT 14.8 15.1* 15.7* 15.8*  INR 1.14 1.17 1.23 1.24    Urinalysis: No results for input(s): COLORURINE, LABSPEC, PHURINE, GLUCOSEU, HGBUR, BILIRUBINUR, KETONESUR, PROTEINUR, UROBILINOGEN, NITRITE, LEUKOCYTESUR in the last 72 hours.  Invalid input(s): APPERANCEUR    Imaging: No results found.   Medications:   . heparin 1,400 Units/hr (03/29/15 0439)   . amLODipine  10 mg Oral Daily  . aspirin EC  325 mg Oral Daily  . atorvastatin  40 mg Oral q1800  . calcium acetate (Phos Binder)  2,668 mg Oral TID WC  . carvedilol  3.125 mg Oral BID WC  . docusate sodium  100 mg Oral BID  . hydrALAZINE  50 mg Oral 3 times per day  . losartan  100 mg Oral Daily  . pantoprazole  40 mg Oral BID  . sodium chloride  3 mL Intravenous Q12H  . warfarin  5 mg Oral q1800  . Warfarin - Pharmacist Dosing Inpatient   Does not apply q1800   acetaminophen **OR** acetaminophen, ondansetron **OR** ondansetron (ZOFRAN) IV, polyethylene glycol, trolamine salicylate, zolpidem  Assessment/ Plan:  Toni Carlson is a 69 y.o. black female with hypertension, GERD, ESRD, anemia of CKD, SHPTH, right MCA distribution CVA  11/16  [place]  1. ESRD on HD TTHS:  Pt seen during HD today, didn't have HD yesterday due to technical issue with RO unit.  UF target today is 1.5kg.  Will plan for HD again on Tuesday if still here.   2. Acute CVA: Right MCA infarction. Due to new onset atrial fibrillation?  - remains on heparin gtt and warfarin.   3. Anemia of chronic kidney disease: holding epogen now given acute CVA.   4. Secondary Hyperparathyroidism: PTH 4769. Does not tolerate cinacalcet - on high dose hectorol as outpatient.  - continue phoslo.  5. Hypertension: well controlled.   - amlodipine, hydralazine, coreg, and losartan.   LOS: 3 Toni Carlson 11/13/201611:28 AM

## 2015-03-29 NOTE — Clinical Social Work Note (Signed)
Clinical Social Work Assessment  Patient Details  Name: Toni Carlson MRN: [id number] Date of Birth: Feb 01, 1946  Date of referral:  03/29/15               Reason for consult:  Facility Placement                Permission sought to share information with:    Permission granted to share information::     Name::        Agency::     Relationship::     Contact Information:     Housing/Transportation Living arrangements for the past 2 months:  Single Family Home Source of Information:  Patient Patient Interpreter Needed:  None Criminal Activity/Legal Involvement Pertinent to Current Situation/Hospitalization:  No - Comment as needed Significant Relationships:  Adult Children Lives with:  Self Do you feel safe going back to the place where you live?  Yes Need for family participation in patient care:  No (Coment)  Care giving concerns:  Patient lives alone and has a daughter nearby who can assist.   Social Worker assessment / plan:  CSW received consult for possible STR as recommended by OT evaluation. PT evaluation is still pending. Patient admitted today to hospital for stroke. Patient informed CSW that she lives alone but her daughter can assist if needed. Patient made aware of OT recommendations and that PT is still pending. Patient asked if she could return home with home health instead. Patient stated that if she can return home she would rather do that because she wants to do as much for herself as possible. She voiced a strong desire to be as independent as she can be. She inquired about a lifeline and a elevated toilet seat. She states that she goes to dialysis [address] and is taken there by [facility]. Patient very pleasant to talk with.  Patient willing for CSW to initiate a bedsearch in the event that she changes her mind.  Employment status:  Disabled (Comment on whether or not currently receiving Disability) Insurance information:  Medicare PT  Recommendations:   (OT has recommended STR; PT is still pending) Information / Referral to community resources:     Patient/Family's Response to care:  Patient very encouraged to work hard to get better so she can return home.   Patient/Family's Understanding of and Emotional Response to Diagnosis, Current Treatment, and Prognosis:  Patient strong willed and determined. Patient expressed appreciation for CSW visit.   Emotional Assessment Appearance:  Appears stated age Attitude/Demeanor/Rapport:   (pleasant and cooperative and motivated) Affect (typically observed):  Adaptable, Calm, Happy Orientation:  Oriented to Self, Oriented to Place, Oriented to  Time, Oriented to Situation Alcohol / Substance use:  Not Applicable Psych involvement (Current and /or in the community):  No (Comment)  Discharge Needs  Concerns to be addressed:  Care Coordination Readmission within the last 30 days:  No Current discharge risk:  None Barriers to Discharge:  No Barriers Identified   Shela Leff, LCSW 03/29/2015, 3:45 PM

## 2015-03-29 NOTE — Progress Notes (Signed)
PT Cancellation Note  Patient Details Name: Casi Alimi MRN: [id number] DOB: December 11, 1945   Cancelled Treatment:    Reason Eval/Treat Not Completed: Patient at procedure or test/unavailable. Patient undergoing hemodialysis. Will check back later.   Dorice Lamas, PT, DPT 03/29/2015, 12:10 PM

## 2015-03-29 NOTE — Progress Notes (Signed)
ANTICOAGULATION CONSULT NOTE - Follow Up  Pharmacy Consult for heparin/coumadin Indication: atrial fibrillation  No Known Allergies  Patient Measurements: Height: 5\' 6"  (167.6 cm) Weight: 150 lb (68.04 kg) IBW/kg (Calculated) : 59.3 Heparin Dosing Weight: 68kg  Vital Signs: Temp: 98.6 F (37 C) (11/12 2105) Temp Source: Oral (11/12 2105) BP: 143/62 mmHg (11/12 2105) Pulse Rate: 74 (11/12 2105)  Labs:  Recent Labs  03/26/15 1149 03/26/15 1746 03/26/15 2330 03/27/15 0534 03/27/15 1728 03/28/15 0424 03/28/15 1359 03/28/15 2213  HGB 11.5*  --   --  10.7*  --  10.2*  --   --   HCT 36.5  --   --  34.7*  --  32.2*  --   --   PLT 146*  --   --  127*  --  119*  --   --   APTT 35  --   --   --  35  --   --   --   LABPROT 14.8  --   --   --  15.1* 15.7*  --   --   INR 1.14  --   --   --  1.17 1.23  --   --   HEPARINUNFRC  --   --   --   --   --  0.19* 0.31 0.21*  CREATININE 4.88*  --   --  6.29*  --  7.29*  --   --   TROPONINI 0.14* 0.14* 0.14*  --   --   --   --   --     Estimated Creatinine Clearance: 7 mL/min (by C-G formula based on Cr of 7.29).   Medical History: Past Medical History  Diagnosis Date  . Hypertension   . ESRD (end stage renal disease) ([place])     On Tue-Thur-Sat dialysis  . CHF (congestive heart failure) ([facility])     Diastolic heart failure  . Anemia   . Hyperparathyroidism, secondary renal [facility])    Assessment: Pharmacy consulted to dose heparin as a bridge to warfarin for afib in this 69 year old female with ESRD on dialysis.  Goal of Therapy:  Heparin level 0.3-0.7 units/ml INR 2-3   Plan:  1112 0424 heparin level subtherapeutic. 2000 unit IV bolus x 1 and increase rate to 1250 units/hr. Will recheck heparin level in 8 hours.   11/12: Heparin level resulted @ 0.31, which is therapeutic. Will continue current rate of heparin 1250 units/hr. Next Anti xa level to be drawn @ 2200  1112 2213 heparin level subtherapeutic. 1000 unit IV bolus x 1 and  increase rate to 1400 units/hr. Will recheck heparin level in 8 hours.  Laural Benes, Pharm.D.  Clinical Pharmacist 03/29/2015,12:02 AM

## 2015-03-29 NOTE — Progress Notes (Signed)
ANTICOAGULATION CONSULT NOTE - Follow Up  Pharmacy Consult for heparin/coumadin Indication: atrial fibrillation  No Known Allergies  Patient Measurements: Height: 5\' 6"  (167.6 cm) Weight: 147 lb 8 oz (66.906 kg) IBW/kg (Calculated) : 59.3 Heparin Dosing Weight: 68kg  Vital Signs: Temp: 98.2 F (36.8 C) (11/13 1449) Temp Source: Oral (11/13 1449) BP: 153/65 mmHg (11/13 1449) Pulse Rate: 81 (11/13 1449)  Labs:  Recent Labs  03/26/15 1746 03/26/15 2330  03/27/15 0534 03/27/15 1728 03/28/15 0424  03/28/15 2213 03/29/15 0443 03/29/15 0809 03/29/15 1604  HGB  --   --   < > 10.7*  --  10.2*  --   --  9.8*  --   --   HCT  --   --   --  34.7*  --  32.2*  --   --  32.4*  --   --   PLT  --   --   --  127*  --  119*  --   --  128*  --   --   APTT  --   --   --   --  35  --   --   --   --   --   --   LABPROT  --   --   --   --  15.1* 15.7*  --   --  15.8*  --   --   INR  --   --   --   --  1.17 1.23  --   --  1.24  --   --   HEPARINUNFRC  --   --   --   --   --  0.19*  < > 0.21*  --  0.40 0.34  CREATININE  --   --   --  6.29*  --  7.29*  --   --  8.75*  --   --   TROPONINI 0.14* 0.14*  --   --   --   --   --   --   --   --   --   < > = values in this interval not displayed.  Estimated Creatinine Clearance: 5.8 mL/min (by C-G formula based on Cr of 8.75).   Medical History: Past Medical History  Diagnosis Date  . Hypertension   . ESRD (end stage renal disease) ([place])     On Tue-Thur-Sat dialysis  . CHF (congestive heart failure) ([facility])     Diastolic heart failure  . Anemia   . Hyperparathyroidism, secondary renal [facility])    Assessment: Pharmacy consulted to dose heparin as a bridge to warfarin for afib in this 69 year old female with ESRD on dialysis.  Goal of Therapy:  Heparin level 0.3-0.7 units/ml INR 2-3   Plan:  1112 0424 heparin level subtherapeutic. 2000 unit IV bolus x 1 and increase rate to 1250 units/hr. Will recheck heparin level in 8 hours.   11/12:  Heparin level resulted @ 0.31, which is therapeutic. Will continue current rate of heparin 1250 units/hr. Next Anti xa level to be drawn @ 2200  1112 2213 heparin level subtherapeutic. 1000 unit IV bolus x 1 and increase rate to 1400 units/hr. Will recheck heparin level in 8 hours.  1113 Anti Xa level resulted @ 0.41, which is therapeutic. Will order another confirmation lab to be drawn @ 16:00.  11/13 Confirmation heparin level at 16:00 is therapeutic @ 0.34. Will check heparin level and CBC tomorrow morning.   Pharmacy will continue to monitor  per protocol.  INR resulted @ 1.24.  Continue warfarin 5 mg PO daily.   Lenis Noon, Pharm.D.  Clinical Pharmacist 03/29/2015,4:47 PM

## 2015-03-29 NOTE — Progress Notes (Signed)
ANTICOAGULATION CONSULT NOTE - Follow Up  Pharmacy Consult for heparin/coumadin Indication: atrial fibrillation  No Known Allergies  Patient Measurements: Height: 5\' 6"  (167.6 cm) Weight: 150 lb (68.04 kg) IBW/kg (Calculated) : 59.3 Heparin Dosing Weight: 68kg  Vital Signs: Temp: 97.8 F (36.6 C) (11/13 0522) Temp Source: Oral (11/13 0522) BP: 145/54 mmHg (11/13 0522) Pulse Rate: 67 (11/13 0522)  Labs:  Recent Labs  03/26/15 1149 03/26/15 1746 03/26/15 2330 03/27/15 0534 03/27/15 1728  03/28/15 0424 03/28/15 1359 03/28/15 2213 03/29/15 0443 03/29/15 0809  HGB 11.5*  --   --  10.7*  --   --  10.2*  --   --   --   --   HCT 36.5  --   --  34.7*  --   --  32.2*  --   --   --   --   PLT 146*  --   --  127*  --   --  119*  --   --   --   --   APTT 35  --   --   --  35  --   --   --   --   --   --   LABPROT 14.8  --   --   --  15.1*  --  15.7*  --   --  15.8*  --   INR 1.14  --   --   --  1.17  --  1.23  --   --  1.24  --   HEPARINUNFRC  --   --   --   --   --   < > 0.19* 0.31 0.21*  --  0.40  CREATININE 4.88*  --   --  6.29*  --   --  7.29*  --   --   --   --   TROPONINI 0.14* 0.14* 0.14*  --   --   --   --   --   --   --   --   < > = values in this interval not displayed.  Estimated Creatinine Clearance: 7 mL/min (by C-G formula based on Cr of 7.29).   Medical History: Past Medical History  Diagnosis Date  . Hypertension   . ESRD (end stage renal disease) ([place])     On Tue-Thur-Sat dialysis  . CHF (congestive heart failure) ([facility])     Diastolic heart failure  . Anemia   . Hyperparathyroidism, secondary renal [facility])    Assessment: Pharmacy consulted to dose heparin as a bridge to warfarin for afib in this 69 year old female with ESRD on dialysis.  Goal of Therapy:  Heparin level 0.3-0.7 units/ml INR 2-3   Plan:  1112 0424 heparin level subtherapeutic. 2000 unit IV bolus x 1 and increase rate to 1250 units/hr. Will recheck heparin level in 8 hours.   11/12:  Heparin level resulted @ 0.31, which is therapeutic. Will continue current rate of heparin 1250 units/hr. Next Anti xa level to be drawn @ 2200  1112 2213 heparin level subtherapeutic. 1000 unit IV bolus x 1 and increase rate to 1400 units/hr. Will recheck heparin level in 8 hours.  1113 Anti Xa level resulted @ 0.41, which is therapeutic. Will order another confirmation lab to be drawn @ 16:00.  INR resulted @ 1.24.  Continue warfarin 5 mg PO daily.   Juanette Urizar D, Pharm.D.  Clinical Pharmacist 03/29/2015,8:43 AM

## 2015-03-29 NOTE — NC FL2 (Signed)
[place] LEVEL OF CARE SCREENING TOOL     IDENTIFICATION  Patient Name: Toni Carlson Birthdate: 1945/07/26 Sex: female Admission Date (Current Location): 03/26/2015  [place] and [place] Number:     Facility and Address:  [facility], [address], [place], [place] [address]      Provider Number: [id number]  Attending Physician Name and Address:  Theodoro Grist, MD  Relative Name and Phone Number:       Current Level of Care: Hospital Recommended Level of Care: [place] Prior Approval Number:    Date Approved/Denied:   PASRR Number: [id number]  Discharge Plan: SNF    Current Diagnoses: Patient Active Problem List   Diagnosis Date Noted  . Atrial fibrillation ([place])   . CVA (cerebral infarction) 03/26/2015    Orientation ACTIVITIES/SOCIAL BLADDER RESPIRATION    Self, Time, Situation, Place  Active Continent Normal  BEHAVIORAL SYMPTOMS/MOOD NEUROLOGICAL BOWEL NUTRITION STATUS   (none)  (none) Continent Diet (fluid restriction)  PHYSICIAN VISITS COMMUNICATION OF NEEDS Height & Weight Skin  30 days Verbally   147 lbs. Normal          AMBULATORY STATUS RESPIRATION    Supervision limited Normal      Personal Care Assistance Level of Assistance  Dressing, Bathing Bathing Assistance: Limited assistance   Dressing Assistance: Limited assistance      Functional Limitations Info   (none)             SPECIAL CARE FACTORS FREQUENCY   (dialysis at [facility] on t,th,sat)                   Additional Factors Info  Code Status, Allergies Code Status Info: full Allergies Info: nka           Current Medications (03/29/2015): Current Facility-Administered Medications  Medication Dose Route Frequency Provider Last Rate Last Dose  . acetaminophen (TYLENOL) tablet 650 mg  650 mg Oral Q6H PRN Gladstone Lighter, MD       Or  . acetaminophen (TYLENOL) suppository 650 mg  650 mg Rectal  Q6H PRN Gladstone Lighter, MD      . amLODipine (NORVASC) tablet 10 mg  10 mg Oral Daily Gladstone Lighter, MD   10 mg at 03/28/15 0806  . aspirin EC tablet 325 mg  325 mg Oral Daily Demetrios Loll, MD   325 mg at 03/29/15 0950  . atorvastatin (LIPITOR) tablet 40 mg  40 mg Oral q1800 Gladstone Lighter, MD   40 mg at 03/28/15 1720  . calcium acetate (Phos Binder) (PHOSLYRA) 667 MG/5ML oral solution 2,668 mg  2,668 mg Oral TID WC Gladstone Lighter, MD   2,668 mg at 03/29/15 0759  . carvedilol (COREG) tablet 3.125 mg  3.125 mg Oral BID WC Lavonia Dana, MD   3.125 mg at 03/28/15 1720  . docusate sodium (COLACE) capsule 100 mg  100 mg Oral BID Gladstone Lighter, MD   100 mg at 03/29/15 0950  . heparin ADULT infusion 100 units/mL (25000 units/250 mL)  1,400 Units/hr Intravenous Continuous Theodoro Grist, MD 14 mL/hr at 03/29/15 0439 1,400 Units/hr at 03/29/15 0439  . hydrALAZINE (APRESOLINE) tablet 50 mg  50 mg Oral 3 times per day Rise Mu, PA-C   50 mg at 03/29/15 1502  . losartan (COZAAR) tablet 100 mg  100 mg Oral Daily Gladstone Lighter, MD   100 mg at 03/28/15 0806  . ondansetron (ZOFRAN) tablet 4 mg  4 mg Oral Q6H  PRN Gladstone Lighter, MD       Or  . ondansetron [facility]) injection 4 mg  4 mg Intravenous Q6H PRN Gladstone Lighter, MD      . pantoprazole (PROTONIX) EC tablet 40 mg  40 mg Oral BID Gladstone Lighter, MD   40 mg at 03/29/15 0950  . polyethylene glycol (MIRALAX / GLYCOLAX) packet 17 g  17 g Oral Daily PRN Gladstone Lighter, MD      . sodium chloride 0.9 % injection 3 mL  3 mL Intravenous Q12H Gladstone Lighter, MD   3 mL at 03/29/15 0952  . trolamine salicylate (ASPERCREME) 10 % cream   Topical BID PRN Lance Coon, MD   1 application at [id number]  . warfarin (COUMADIN) tablet 5 mg  5 mg Oral q1800 Demetrios Loll, MD   5 mg at 03/28/15 1720  . Warfarin - Pharmacist Dosing Inpatient   Does not apply [id number] Demetrios Loll, MD      . zolpidem (AMBIEN) tablet 2.5 mg  2.5 mg Oral QHS PRN Theodoro Grist, MD       Do not use this list as official medication orders. Please verify with discharge summary.  Discharge Medications:   Medication List    ASK your doctor about these medications        amLODipine 10 MG tablet  Commonly known as:  NORVASC  Take 10 mg by mouth daily.     hydrALAZINE 25 MG tablet  Commonly known as:  APRESOLINE  Take 25 mg by mouth 3 (three) times daily. 1 qam and 2 qpm     losartan 100 MG tablet  Commonly known as:  COZAAR  Take 100 mg by mouth daily.     NEXIUM 40 MG capsule  Generic drug:  esomeprazole  Take 40 mg by mouth daily at 12 noon.     PHOSLYRA 667 MG/5ML Soln  Generic drug:  calcium acetate (Phos Binder)  Take 1,334 mg by mouth 3 (three) times daily with meals.        Relevant Imaging Results:  Relevant Lab Results:  Recent Labs    Additional Information SS: [id number]  Shela Leff, LCSW

## 2015-03-29 NOTE — Progress Notes (Signed)
[place] [facility] [place] NAME: Toni Carlson    MR#:  [id number]  DATE OF BIRTH:  09/12/1945  SUBJECTIVE:  CHIEF COMPLAINT:   Chief Complaint  Patient presents with  . Code Stroke   the patient feels better overall, still has some slurred speech, no dysphagia. Left-sided weakness, which is improving, left facial droop, improving.  Occupational therapist recommended skilled nursing facility placement. Physical therapist recommendations pending. Patient is for hemodialysis today, emergency hemodialysis was performed yesterday and patient's hemodialysis slot was taken  REVIEW OF SYSTEMS:  CONSTITUTIONAL: No fever, fatigue or weakness.  EYES: No blurred or double vision.  EARS, NOSE, AND THROAT: No tinnitus or ear pain.  RESPIRATORY: No cough, shortness of breath, wheezing or hemoptysis.  CARDIOVASCULAR: No chest pain, orthopnea, edema.  GASTROINTESTINAL: No nausea, vomiting, diarrhea or abdominal pain.  GENITOURINARY: No dysuria, hematuria.  ENDOCRINE: No polyuria, nocturia,  HEMATOLOGY: No anemia, easy bruising or bleeding SKIN: No rash or lesion. MUSCULOSKELETAL: No joint pain or arthritis.   NEUROLOGIC: No tingling, numbness, weakness. But has slurred speech. PSYCHIATRY: No anxiety or depression.   DRUG ALLERGIES:  No Known Allergies  VITALS:  Blood pressure 148/81, pulse 72, temperature 97.4 F (36.3 C), temperature source Axillary, resp. rate 20, height 5\' 6"  (1.676 m), weight 69.3 kg (152 lb 12.5 oz), SpO2 95 %.  PHYSICAL EXAMINATION:  GENERAL:  69 y.o.-year-old patient lying in the bed with no acute distress.  EYES: Pupils equal, round, reactive to light and accommodation. No scleral icterus. Extraocular muscles intact.  HEENT: Head atraumatic, normocephalic. Oropharynx and nasopharynx clear.  NECK:  Supple, no jugular venous distention. No thyroid enlargement, no tenderness.  LUNGS: Normal breath sounds bilaterally, no wheezing,  rales,rhonchi or crepitation. No use of accessory muscles of respiration.  CARDIOVASCULAR: S1, S2 normal. No murmurs, rubs, or gallops.  ABDOMEN: Soft, nontender, nondistended. Bowel sounds present. No organomegaly or mass.  EXTREMITIES: No pedal edema, cyanosis, or clubbing.  NEUROLOGIC: Cranial nerves II through XII . Left facial weakness sparing forehead. Muscle strength 3/5 in all left upper extremity and forearm, and bowel movements in proximal arm, But improved grip and forearm strength.  Sensation grossly intact. Gait not checked.  PSYCHIATRIC: The patient is alert and oriented x 3.  SKIN: No obvious rash, lesion, or ulcer.    LABORATORY PANEL:   CBC  Recent Labs Lab 03/29/15 0443  WBC 6.1  HGB 9.8*  HCT 32.4*  PLT 128*   ------------------------------------------------------------------------------------------------------------------  Chemistries   Recent Labs Lab 03/26/15 1149  03/29/15 0443  NA 141  < > 135  K 3.2*  < > 4.3  CL 98*  < > 98*  CO2 30  < > 24  GLUCOSE 100*  < > 113*  BUN 30*  < > 74*  CREATININE 4.88*  < > 8.75*  CALCIUM 9.7  < > 9.4  AST 18  --   --   ALT 13*  --   --   ALKPHOS 294*  --   --   BILITOT 0.8  --   --   < > = values in this interval not displayed. ------------------------------------------------------------------------------------------------------------------  Cardiac Enzymes  Recent Labs Lab 03/26/15 2330  TROPONINI 0.14*   ------------------------------------------------------------------------------------------------------------------  RADIOLOGY:  No results found.  EKG:   Orders placed or performed during the hospital encounter of 03/26/15  . ED EKG  . ED EKG  . EKG 12-Lead  . EKG 12-Lead  . EKG 12-Lead  .  EKG 12-Lead  . EKG 12-Lead  . EKG 12-Lead    ASSESSMENT AND PLAN:   #1 Acute CVA with left-sided weakness.  -CT of the head with right frontotemporal infarct. MRI: right MCA infart. Passed speech  study,  follow-up physical therapy consult and occupational therapy consult, recommended skilled nursing facility placement for rehabilitation. Carotid Dopplers showed left side carotid artery stenosis 50-69%; vascular surgery recommend follow-up as outpatient echocardiogram: EF 60% to 65%. Continue heparin IV drip and Lipitor. Neurology recommends anticoagulation with Coumadin for atrial fibrillation, vascular surgery, no intervention, but follow-up as outpatient. Awaiting for pro time. Discharge to skilled nursing facility when pro time is 2.0 and above, social worker sign involved for placement  #2 end-stage renal disease-on Tuesday Thursday Saturday hemodialysis. Continue PhosLo and Renvela.  #3 hypertension. Continue Norvasc, losartan and hydralazine.   #4 anemia of chronic disease-hemoglobin is stable.  #5*New onset of A. Fib. Rate is controlled. Continue Coreg 3.125 mg twice a day. Aspirin for now and needs [place]-term anticoagulation with Coumadin, awaiting for therapeutic level  #6  mild elevated troponin. Due to CVA or ESRD. Echo is unremarkable, no further interventions   All the records are reviewed and case discussed with Care Management/Social Workerr. Management plans discussed with the patient, family and they are in agreement. Greater than 50% time was spent on coordination of care and face-to-face counseling.  CODE STATUS: Full code  TOTAL TIME TAKING CARE OF THIS PATIENT: 35 minutes.   POSSIBLE D/C IN 3 DAYS, DEPENDING ON CLINICAL CONDITION and pro time level   Cherice Glennie M.D on 03/29/2015 at 1:19 PM  Between 7am to 6pm - Pager - [phone]  After 6pm go to [email or web address] - password [email or web address] [place] [facility]  Office  [phone]  CC: Primary care physician; Murlean Iba, MD

## 2015-03-29 NOTE — Plan of Care (Signed)
Problem: Education: Goal: Knowledge of [facility] Education information/materials will improve Outcome: Progressing High fall risk with bed alarm activated, pt verbalizes and demonstrates understanding and use of call light. Pt also verbalizes understanding of care plan, diet, fluid restriction and upcoming HD.   Problem: Safety: Goal: Ability to remain free from injury will improve Outcome: Progressing High fall risk with bed alarm activated, pt has remained free of injury this shift.     Problem: Education: Goal: Knowledge of disease or condition will improve Outcome: Progressing Pt verbalizes understanding of care plan and treatment.  Goal: Knowledge of secondary prevention will improve Outcome: Progressing Patient verbalizes understanding of use of heparin gtt and verbalizes understanding of worsening signs and symptoms associated with a stroke.  Goal: Knowledge of patient specific risk factors addressed and post discharge goals established will improve Outcome: Progressing Patient continues on heparin gtt, bolus and increased gtt to 47ml/hr this shift. Pt continues on a 1231ml fluid restriction and is scheduled for HD at 9AM.

## 2015-03-30 DIAGNOSIS — I482 Chronic atrial fibrillation: Secondary | ICD-10-CM

## 2015-03-30 LAB — CBC WITH DIFFERENTIAL/PLATELET
BASOS ABS: 0 10*3/uL (ref 0–0.1)
Basophils Relative: 0 %
Eosinophils Absolute: 0 10*3/uL (ref 0–0.7)
Eosinophils Relative: 1 %
HEMATOCRIT: 31.1 % — AB (ref 35.0–47.0)
Hemoglobin: 9.8 g/dL — ABNORMAL LOW (ref 12.0–16.0)
LYMPHS ABS: 0.4 10*3/uL — AB (ref 1.0–3.6)
LYMPHS PCT: 8 %
MCH: 28.8 pg (ref 26.0–34.0)
MCHC: 31.6 g/dL — ABNORMAL LOW (ref 32.0–36.0)
MCV: 91 fL (ref 80.0–100.0)
MONO ABS: 0.4 10*3/uL (ref 0.2–0.9)
Monocytes Relative: 8 %
NEUTROS ABS: 4.1 10*3/uL (ref 1.4–6.5)
Neutrophils Relative %: 83 %
Platelets: 109 10*3/uL — ABNORMAL LOW (ref 150–440)
RBC: 3.41 MIL/uL — ABNORMAL LOW (ref 3.80–5.20)
RDW: 20.2 % — AB (ref 11.5–14.5)
WBC: 4.9 10*3/uL (ref 3.6–11.0)

## 2015-03-30 LAB — HEPATITIS B SURFACE ANTIGEN: HEP B S AG: NEGATIVE

## 2015-03-30 LAB — PROTIME-INR
INR: 1.24
Prothrombin Time: 15.8 seconds — ABNORMAL HIGH (ref 11.4–15.0)

## 2015-03-30 LAB — PHOSPHORUS: Phosphorus: 4.2 mg/dL (ref 2.5–4.6)

## 2015-03-30 LAB — HEPARIN LEVEL (UNFRACTIONATED): Heparin Unfractionated: 0.3 IU/mL (ref 0.30–0.70)

## 2015-03-30 LAB — HEPATITIS B SURFACE ANTIBODY, QUANTITATIVE: Hepatitis B-Post: <3.1 m[IU]/mL — ABNORMAL LOW (ref >9.9)

## 2015-03-30 MED ORDER — HEPARIN SODIUM (PORCINE) 1000 UNIT/ML DIALYSIS: 1000.0000 [IU] | INTRAMUSCULAR | Status: DC | PRN

## 2015-03-30 MED ORDER — PENTAFLUOROPROP-TETRAFLUOROETH EX AERO: 1.0000 "application " | INHALATION_SPRAY | CUTANEOUS | Status: DC | PRN

## 2015-03-30 MED ORDER — LIDOCAINE HCL (PF) 1 % IJ SOLN: 5.0000 mL | INTRAMUSCULAR | Status: DC | PRN

## 2015-03-30 MED ORDER — SODIUM CHLORIDE 0.9 % IV SOLN: 100.0000 mL | INTRAVENOUS | Status: DC | PRN

## 2015-03-30 MED ORDER — CARVEDILOL 3.125 MG PO TABS
6.2500 mg | ORAL_TABLET | Freq: Two times a day (BID) | ORAL | Status: DC
  Administered 2015-03-30 – 2015-04-01 (×3): 6.25 mg via ORAL
  Filled 2015-03-30 (×4): qty 2

## 2015-03-30 MED ORDER — LIDOCAINE-PRILOCAINE 2.5-2.5 % EX CREA: 1.0000 "application " | TOPICAL_CREAM | CUTANEOUS | Status: DC | PRN

## 2015-03-30 MED ORDER — WARFARIN SODIUM 5 MG PO TABS
10.0000 mg | ORAL_TABLET | Freq: Every day | ORAL | Status: DC
  Administered 2015-03-30 – 2015-03-31 (×2): 10 mg via ORAL
  Filled 2015-03-30 (×2): qty 2

## 2015-03-30 MED ORDER — ALTEPLASE 2 MG IJ SOLR: 2.0000 mg | Freq: Once | INTRAMUSCULAR | Status: DC | PRN

## 2015-03-30 NOTE — Progress Notes (Signed)
Patient: Toni Carlson / Admit Date: 03/26/2015 / Date of Encounter: 03/30/2015, 9:20 AM   Subjective: No complaints. Improving slurred speech and residual left-sided weakness.   Review of Systems: Review of Systems  Constitutional: Positive for weight loss and malaise/fatigue. Negative for fever, chills and diaphoresis.  HENT: Negative for congestion.   Eyes: Negative for discharge and redness.  Respiratory: Negative for cough, hemoptysis, sputum production, shortness of breath and wheezing.   Cardiovascular: Negative for chest pain, palpitations, orthopnea, claudication, leg swelling and PND.  Gastrointestinal: Negative for nausea and vomiting.  Musculoskeletal: Negative for falls.  Skin: Negative for rash.  Neurological: Positive for speech change, focal weakness and weakness. Negative for dizziness, tingling, tremors, sensory change and loss of consciousness.       Improving slurred speech and residual left sided weakness.   Psychiatric/Behavioral: The patient is not nervous/anxious.      Objective: Telemetry: Afib, 70's Physical Exam: Blood pressure 151/57, pulse 71, temperature 98.1 F (36.7 C), temperature source Oral, resp. rate 18, height 5\' 6"  (1.676 m), weight 147 lb 8 oz (66.906 kg), SpO2 97 %. Body mass index is 23.82 kg/(m^2). General: Well developed, well nourished, in no acute distress. Head: Normocephalic, atraumatic, sclera non-icteric, no xanthomas, nares are without discharge. Neck: Negative for carotid bruits. JVP not elevated. Lungs: Clear bilaterally to auscultation without wheezes, rales, or rhonchi. Breathing is unlabored. Heart: Irregularly-irregular, S1 S2 without murmurs, rubs, or gallops.  Abdomen: Soft, non-tender, non-distended with normoactive bowel sounds. No rebound/guarding. Extremities: No clubbing or cyanosis. No edema. Distal pedal pulses are 2+ and equal bilaterally. Neuro: Alert and oriented X 3. Moves all extremities  spontaneously. Psych:  Responds to questions appropriately with a normal affect.   Intake/Output Summary (Last 24 hours) at 03/30/15 0920 Last data filed at 03/29/15 1800  Gross per 24 hour  Intake    120 ml  Output   1500 ml  Net  -1380 ml    Inpatient Medications:  . amLODipine  10 mg Oral Daily  . aspirin EC  325 mg Oral Daily  . atorvastatin  40 mg Oral q1800  . calcium acetate (Phos Binder)  2,668 mg Oral TID WC  . carvedilol  3.125 mg Oral BID WC  . docusate sodium  100 mg Oral BID  . hydrALAZINE  50 mg Oral 3 times per day  . losartan  100 mg Oral Daily  . pantoprazole  40 mg Oral BID  . sodium chloride  3 mL Intravenous Q12H  . warfarin  5 mg Oral q1800  . Warfarin - Pharmacist Dosing Inpatient   Does not apply q1800   Infusions:  . heparin 1,400 Units/hr (03/29/15 0439)    Labs:  Recent Labs  03/28/15 0424 03/29/15 0443 03/30/15 0525  NA  --  135  --   K  --  4.3  --   CL  --  98*  --   CO2  --  24  --   GLUCOSE  --  113*  --   BUN  --  74*  --   CREATININE 7.29* 8.75*  --   CALCIUM  --  9.4  --   PHOS  --  5.0* 4.2    Recent Labs  03/29/15 0443  ALBUMIN 3.0*    Recent Labs  03/29/15 0443 03/30/15 0525  WBC 6.1 4.9  NEUTROABS  --  4.1  HGB 9.8* 9.8*  HCT 32.4* 31.1*  MCV 91.4 91.0  PLT 128* 109*  No results for input(s): CKTOTAL, CKMB, TROPONINI in the last 72 hours. Invalid input(s): POCBNP No results for input(s): HGBA1C in the last 72 hours.   Weights: Filed Weights   03/29/15 1045 03/29/15 1415 03/29/15 1449  Weight: 152 lb 12.5 oz (69.3 kg) 148 lb 9.4 oz (67.4 kg) 147 lb 8 oz (66.906 kg)     Radiology/Studies:  Ct Head Wo Contrast  03/26/2015  CLINICAL DATA:  Acute onset left-sided paralysis and slurred speech. Chronic renal failure EXAM: CT HEAD WITHOUT CONTRAST TECHNIQUE: Contiguous axial images were obtained from the base of the skull through the vertex without intravenous contrast. COMPARISON:  None. FINDINGS: There is  mild diffuse atrophy. There is no intracranial mass, hemorrhage, extra-axial fluid collection, or midline shift. There is patchy small vessel disease throughout the centra semiovale bilaterally. There is a focal area of decreased attenuation in the right frontal -temporal junction, concerning for early acute infarct. Attenuation of the middle cerebral arteries is symmetric and normal bilaterally. The bony calvarium appears intact. The mastoid air cells are clear. There is carotid artery calcification in the siphon region bilaterally. There are bony changes in the calvarium consistent with secondary hyperparathyroidism from chronic renal failure. Mastoid air cells are clear bilaterally. There is opacification of a left-sided ethmoid air cell. IMPRESSION: Suspect early infarct at the right frontal -temporal junction. There is mild atrophy with patchy small vessel disease throughout the centra semiovale bilaterally. No hemorrhage. Bony changes consistent with secondary hyperparathyroidism/chronic renal failure noted. Mild left-sided ethmoid sinus disease. Critical Value/emergent results were called by telephone at the time of interpretation on 03/26/2015 at 11:49 am to Dr. Meade Maw, ED physician , who verbally acknowledged these results. Electronically Signed   By: Lowella Grip III M.D.   On: 03/26/2015 11:51   Mr Virgel Paling [id number] Contrast  03/26/2015  ADDENDUM REPORT: 03/26/2015 14:41 ADDENDUM: Study discussed by telephone with Dr. Gladstone Lighter on 03/26/2015 at 1410 hours. Electronically Signed   By: Genevie Ann M.D.   On: 03/26/2015 14:41  03/26/2015  CLINICAL DATA:  69 year old female with left facial droop noted upon arrival to dialysis this morning. Symptoms not improved. Initial encounter. EXAM: MRI HEAD WITHOUT CONTRAST MRA HEAD WITHOUT CONTRAST TECHNIQUE: Multiplanar, multiecho pulse sequences of the brain and surrounding structures were obtained without intravenous contrast. Angiographic images of  the head were obtained using MRA technique without contrast. COMPARISON:  Head CT without contrast 1142 hours today. FINDINGS: MRI HEAD FINDINGS 2-3 cm area of restricted diffusion in the right MCA territory affecting the insula and operculum primarily, but with additional scattered cortically based and subcortical white matter infarcts tracking cephalad into the right motor strip (series 100, image 38 and series 102, image 26). Associated T2 and FLAIR hyperintensity with no hemorrhage or mass effect. No contralateral or posterior fossa restricted diffusion. Major intracranial vascular flow voids are within normal limits. Superimposed bilateral patchy and confluent cerebral white matter T2 and FLAIR hyperintensity. No chronic cerebral blood products or cortical encephalomalacia. There is a small chronic lacunar infarct in the right thalamus. Brainstem and cerebellum within normal limits. No midline shift, mass effect, evidence of mass lesion, ventriculomegaly, extra-axial collection or acute intracranial hemorrhage. Cervicomedullary junction and pituitary are within normal limits. Bilateral mastoid effusions. Negative nasopharynx. Trace paranasal sinus mucosal thickening. Rightward gaze deviation. Negative paranasal sinuses. Negative scalp soft tissues. Degenerative changes in the cervical spine. Decreased T1 bone marrow signal may be related to renal osteodystrophy. MRA HEAD FINDINGS Study is mildly degraded by motion  artifact. Absent antegrade flow signal in the distal left vertebral artery. Antegrade flow in the right vertebral artery and basilar. No basilar stenosis. SCA and left PCA origins are patent. Fetal type right PCA origin. Diminutive or absent left posterior communicating artery. The right PCA appears occluded in the P2 segment. The left PCA is irregular but patent. Antegrade flow in both ICA siphons. No siphon stenosis. Infundibulum at the right ophthalmic artery origin suspected. Patent carotid  termini. Patent MCA and ACA origins. 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 Motion artifact degrades detail of the a 2 branches which appear patent. Left MCA M1 segment and bifurcation are patent. No left MCA branch occlusion identified. Right MCA M1 segment is patent. There is irregularity and high-grade stenosis at the right MCA bifurcation. Posterior sylvian divisions appear to remain patent. There is a flow gap at the origin of the dominant anterior division with reconstituted flow (series 107, image 12). IMPRESSION: 1. 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 Involvement of the insula, operculum, and lateral motor strip. 2. Evidence of emergent large vessel occlusion at the right MCA bifurcation, mostly affecting the dominant anterior M2 branch with reconstituted flow distally. 3. 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 4. 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 5. Moderate cerebral white matter signal changes and small chronic lacunar infarct in the right thalamus compatible with chronic small vessel disease. Electronically Signed: By: Genevie Ann M.D. On: 03/26/2015 14:04   Mr Brain Wo Contrast  03/26/2015  ADDENDUM REPORT: 03/26/2015 14:41 ADDENDUM: Study discussed by telephone with Dr. Gladstone Lighter on 03/26/2015 at 1410 hours. Electronically Signed   By: Genevie Ann M.D.   On: 03/26/2015 14:41  03/26/2015  CLINICAL DATA:  69 year old female with left facial droop noted upon arrival to dialysis this morning. Symptoms not improved. Initial encounter. EXAM: MRI HEAD WITHOUT CONTRAST MRA HEAD WITHOUT CONTRAST TECHNIQUE: Multiplanar, multiecho pulse sequences of the brain and surrounding structures were obtained without  intravenous contrast. Angiographic images of the head were obtained using MRA technique without contrast. COMPARISON:  Head CT without contrast 1142 hours today. FINDINGS: MRI HEAD FINDINGS 2-3 cm area of restricted diffusion in the right MCA territory affecting the insula and operculum primarily, but with additional scattered cortically based and subcortical white matter infarcts tracking cephalad into the right motor strip (series 100, image 38 and series 102, image 26). Associated T2 and FLAIR hyperintensity with no hemorrhage or mass effect. No contralateral or posterior fossa restricted diffusion. Major intracranial vascular flow voids are within normal limits. Superimposed bilateral patchy and confluent cerebral white matter T2 and FLAIR hyperintensity. No chronic cerebral blood products or cortical encephalomalacia. There is a small chronic lacunar infarct in the right thalamus. Brainstem and cerebellum within normal limits. No midline shift, mass effect, evidence of mass lesion, ventriculomegaly, extra-axial collection or acute intracranial hemorrhage. Cervicomedullary junction and pituitary are within normal limits. Bilateral mastoid effusions. Negative nasopharynx. Trace paranasal sinus mucosal thickening. Rightward gaze deviation. Negative paranasal sinuses. Negative scalp soft tissues. Degenerative changes in the cervical spine. Decreased T1 bone marrow signal may be related to renal osteodystrophy. MRA HEAD FINDINGS Study is mildly degraded by motion artifact. Absent antegrade flow signal in the distal left vertebral artery. Antegrade flow in  the right vertebral artery and basilar. No basilar stenosis. SCA and left PCA origins are patent. Fetal type right PCA origin. Diminutive or absent left posterior communicating artery. The right PCA appears occluded in the P2 segment. The left PCA is irregular but patent. Antegrade flow in both ICA siphons. No siphon stenosis. Infundibulum at the right ophthalmic  artery origin suspected. Patent carotid termini. Patent MCA and ACA origins. 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 Motion artifact degrades detail of the a 2 branches which appear patent. Left MCA M1 segment and bifurcation are patent. No left MCA branch occlusion identified. Right MCA M1 segment is patent. There is irregularity and high-grade stenosis at the right MCA bifurcation. Posterior sylvian divisions appear to remain patent. There is a flow gap at the origin of the dominant anterior division with reconstituted flow (series 107, image 12). IMPRESSION: 1. 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 Involvement of the insula, operculum, and lateral motor strip. 2. Evidence of emergent large vessel occlusion at the right MCA bifurcation, mostly affecting the dominant anterior M2 branch with reconstituted flow distally. 3. 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 4. 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 5. Moderate cerebral white matter signal changes and small chronic lacunar infarct in the right thalamus compatible with chronic small vessel disease. Electronically Signed: By: Genevie Ann M.D. On: 03/26/2015 14:04   US Carotid Bilateral  03/26/2015  CLINICAL DATA:  Cerebral infarction, personal history of hypertension, CHF, end-stage renal disease, smoking EXAM: BILATERAL CAROTID DUPLEX ULTRASOUND TECHNIQUE: Pearline Cables scale imaging, color Doppler and duplex ultrasound were performed of bilateral carotid and vertebral arteries in the neck. COMPARISON:  None FINDINGS: Criteria: Quantification of carotid stenosis is based on velocity parameters that correlate the residual internal carotid diameter with NASCET-based stenosis levels, using  the diameter of the distal internal carotid lumen as the denominator for stenosis measurement. The following velocity measurements were obtained: RIGHT ICA:  167/43 cm/sec CCA:  0000000 cm/sec SYSTOLIC ICA/CCA RATIO:  [id number] DIASTOLIC ICA/CCA RATIO:  [id number] ECA:  77 cm/sec LEFT ICA:  120/32 cm/sec CCA:  [id number] cm/sec SYSTOLIC ICA/CCA RATIO:  [id number] DIASTOLIC ICA/CCA RATIO:  [id number] ECA:  144 cm/sec RIGHT CAROTID ARTERY: Intimal thickening RIGHT CCA. Small echogenic non shadowing plaque RIGHT carotid bulb. Additional shadowing plaque at distal carotid bulb. Laminar flow on color Doppler imaging. Spectral broadening RIGHT ICA on waveform analysis. No discrete high velocity jet. Mildly tortuous RIGHT carotid system. RIGHT VERTEBRAL ARTERY:  Patent, antegrade LEFT CAROTID ARTERY: Mildly tortuous LEFT carotid system. Intimal thickening LEFT CCA. Echogenic shadowing and non shadowing plaque LEFT carotid bulb extending in the proximal LEFT ICA. Turbulent flow on color Doppler imaging within LEFT ICA. Spectral broadening LEFT ICA on waveform analysis. No high velocity jets. LEFT VERTEBRAL ARTERY:  Patent, antegrade IMPRESSION: Plaque formation at the carotid bifurcations bilaterally associated with turbulent flow at the proximal LEFT ICA. Velocity measurements correspond to a 50-69% diameter stenosis of the RIGHT carotid and a less than 50% diameter stenosis of the LEFT carotid. Electronically Signed   By: Lavonia Dana M.D.   On: 03/26/2015 15:20     Assessment and Plan   1. Embolic stroke: Per primary team. Embolic related to new diagnosis of atrial fib.  2. New onset atrial fibrillation [facility]): Telemetry reviewed today. Rate OK in the 70's.Coreg increased to 6.25 mg bid as below. Patient is now on warfarin and heparin. INR remains subtherapeutic. Could also consider Eliquis as an outpatient.Will discontinue aspirin as this medication has no role in her Afib.    3. Elevated troponin: The plan is for out patient  Lexiscan Myoview in the future given the absence of symptoms and preserved EF.   4. Mitral stenosis: Follow clinically.  5. Pulmonary HTN: She has severely elevated pulmonary pressure. The etiology is not clear. She will need follow up and work up of this which will likely include right heart cath and consideration of vasodilators chronically. However, this can be deferred until she has had improvement and recovery from her acute event.  6. HTN:   Poorly controlled. On Coreg as above, Norvasc 10 mg, losartan 100 mg, and hydralazine 50 mg tid. Will increase Coreg to 6.25 mg bid.     7. ESRD on HD:  Per Ren[facility].    Signed, Christell Faith, PA-C Pager: [phone] 03/30/2015, 9:20 AM

## 2015-03-30 NOTE — Progress Notes (Signed)
[facility]  ROUNDING NOTE   Subjective:  Well. Had dialysis yesterday. Tolerated this quite well. Still on heparin drip.   Objective:  Vital signs in last 24 hours:  Temp:  [97.5 F (36.4 C)-98.6 F (37 C)] 98.3 F (36.8 C) (11/14 1300) Pulse Rate:  [70-99] 71 (11/14 1300) Resp:  [18-24] 18 (11/14 0454) BP: (146-175)/(54-65) 146/57 mmHg (11/14 1300) SpO2:  [97 %-100 %] 98 % (11/14 1300) Weight:  [66.906 kg (147 lb 8 oz)-67.4 kg (148 lb 9.4 oz)] 66.906 kg (147 lb 8 oz) (11/13 1449)  Weight change:  Filed Weights   03/29/15 1045 03/29/15 1415 03/29/15 1449  Weight: 69.3 kg (152 lb 12.5 oz) 67.4 kg (148 lb 9.4 oz) 66.906 kg (147 lb 8 oz)    Intake/Output: I/O last 3 completed shifts: In: 120 [P.O.:120] Out: 1500 [Other:1500]   Intake/Output this shift:  Total I/O In: 240 [P.O.:240] Out: -   Physical Exam: General: NAD sitting up  Head: Normocephalic, atraumatic. Moist oral mucosal membranes  Eyes: Anicteric  Neck: Supple, trachea midline  Lungs:  Clear to auscultation normal effort  Heart: Irregular no rubs  Abdomen:  Soft, nontender, BS present   Extremities: trace peripheral edema.  Neurologic: Left sided weakness, left sided facial droop  Skin: No lesions  Access: Left arm AVF    Basic Metabolic Panel:  Recent Labs Lab 03/26/15 1149 03/27/15 0534 03/28/15 0424 03/29/15 0443 03/30/15 0525  NA 141 138  --  135  --   K 3.2* 3.5  --  4.3  --   CL 98* 98*  --  98*  --   CO2 30 25  --  24  --   GLUCOSE 100* 99  --  113*  --   BUN 30* 47*  --  74*  --   CREATININE 4.88* 6.29* 7.29* 8.75*  --   CALCIUM 9.7 9.7  --  9.4  --   PHOS  --   --   --  5.0* 4.2    Liver Function Tests:  Recent Labs Lab 03/26/15 1149 03/29/15 0443  AST 18  --   ALT 13*  --   ALKPHOS 294*  --   BILITOT 0.8  --   PROT 7.2  --   ALBUMIN 3.4* 3.0*   No results for input(s): LIPASE, AMYLASE in the last 168 hours. No results for input(s): AMMONIA in the  last 168 hours.  CBC:  Recent Labs Lab 03/26/15 1149 03/27/15 0534 03/28/15 0424 03/29/15 0443 03/30/15 0525  WBC 5.6 4.4 5.3 6.1 4.9  NEUTROABS 4.6  --   --   --  4.1  HGB 11.5* 10.7* 10.2* 9.8* 9.8*  HCT 36.5 34.7* 32.2* 32.4* 31.1*  MCV 89.2 89.9 90.1 91.4 91.0  PLT 146* 127* 119* 128* 109*    Cardiac Enzymes:  Recent Labs Lab 03/26/15 1149 03/26/15 1746 03/26/15 2330  TROPONINI 0.14* 0.14* 0.14*    BNP: Invalid input(s): POCBNP  CBG: No results for input(s): GLUCAP in the last 168 hours.  Microbiology: Results for orders placed or performed in visit on 09/19/11  MRSA PCR Screening     Status: None   Collection Time: 09/19/11 11:25 AM  Result Value Ref Range Status   Micro Text Report   Final       SOURCE: nasal    COMMENT                   NEGATIVE - MRSA target DNA not  detected   ANTIBIOTIC                                                        Coagulation Studies:  Recent Labs  03/27/15 1728 03/28/15 0424 03/29/15 0443 03/30/15 0525  LABPROT 15.1* 15.7* 15.8* 15.8*  INR 1.17 1.23 1.24 1.24    Urinalysis: No results for input(s): COLORURINE, LABSPEC, PHURINE, GLUCOSEU, HGBUR, BILIRUBINUR, KETONESUR, PROTEINUR, UROBILINOGEN, NITRITE, LEUKOCYTESUR in the last 72 hours.  Invalid input(s): APPERANCEUR    Imaging: No results found.   Medications:   . heparin 1,400 Units/hr (03/29/15 0439)   . amLODipine  10 mg Oral Daily  . atorvastatin  40 mg Oral q1800  . calcium acetate (Phos Binder)  2,668 mg Oral TID WC  . carvedilol  6.25 mg Oral BID WC  . docusate sodium  100 mg Oral BID  . hydrALAZINE  50 mg Oral 3 times per day  . losartan  100 mg Oral Daily  . pantoprazole  40 mg Oral BID  . sodium chloride  3 mL Intravenous Q12H  . warfarin  10 mg Oral q1800  . Warfarin - Pharmacist Dosing Inpatient   Does not apply q1800   sodium chloride, sodium chloride, acetaminophen **OR** acetaminophen, alteplase, heparin, lidocaine (PF),  lidocaine-prilocaine, ondansetron **OR** ondansetron (ZOFRAN) IV, pentafluoroprop-tetrafluoroeth, polyethylene glycol, trolamine salicylate, zolpidem  Assessment/ Plan:  [name] is a 69 y.o. black female with hypertension, GERD, ESRD, anemia of CKD, SHPTH, right MCA distribution CVA 11/16  [place] [facility] [place]  1. ESRD on HD TTHS:  Patient had dialysis yesterday. She tolerated this well. No indication for dialysis today. We'll plan for dialysis again tomorrow if still here.   2. Acute CVA: Right MCA infarction. Due to new onset atrial fibrillation?  - remains on heparin gtt and warfarin.   3. Anemia of chronic kidney disease: Hemoglobin currently stable at 9.8. We are avoiding Epogen given the fact that she had recent CVA.  4. Secondary Hyperparathyroidism: PTH 4769. Does not tolerate cinacalcet - Phosphorus normal at 4.2. We will continue PhosLo at its current dosage.  5. Hypertension: Blood pressure 146/57 today.   - Continue amlodipine, hydralazine, coreg, and losartan.   LOS: 4 Toni Carlson 11/14/20161:46 PM

## 2015-03-30 NOTE — Progress Notes (Signed)
ANTICOAGULATION CONSULT NOTE - Follow Up Consult  Pharmacy Consult for Heparin/Coumadin Indication: atrial fibrillation  No Known Allergies  Patient Measurements: Height: 5\' 6"  (167.6 cm) Weight: 147 lb 8 oz (66.906 kg) IBW/kg (Calculated) : 59.3 Heparin Dosing Weight: 66.9 kg  Vital Signs: Temp: 98.3 F (36.8 C) (11/14 1300) Temp Source: Oral (11/14 1300) BP: 146/57 mmHg (11/14 1300) Pulse Rate: 71 (11/14 1300)  Labs:  Recent Labs  03/27/15 1728  03/28/15 0424  03/29/15 0443 03/29/15 0809 03/29/15 1604 03/30/15 0525  HGB  --   < > 10.2*  --  9.8*  --   --  9.8*  HCT  --   --  32.2*  --  32.4*  --   --  31.1*  PLT  --   --  119*  --  128*  --   --  109*  APTT 35  --   --   --   --   --   --   --   LABPROT 15.1*  --  15.7*  --  15.8*  --   --  15.8*  INR 1.17  --  1.23  --  1.24  --   --  1.24  HEPARINUNFRC  --   --  0.19*  < >  --  0.40 0.34 0.30  CREATININE  --   --  7.29*  --  8.75*  --   --   --   < > = values in this interval not displayed.  Estimated Creatinine Clearance: 5.8 mL/min (by C-G formula based on Cr of 8.75).   Medications:  Scheduled:  . amLODipine  10 mg Oral Daily  . atorvastatin  40 mg Oral q1800  . calcium acetate (Phos Binder)  2,668 mg Oral TID WC  . carvedilol  6.25 mg Oral BID WC  . docusate sodium  100 mg Oral BID  . hydrALAZINE  50 mg Oral 3 times per day  . losartan  100 mg Oral Daily  . pantoprazole  40 mg Oral BID  . sodium chloride  3 mL Intravenous Q12H  . warfarin  10 mg Oral q1800  . Warfarin - Pharmacist Dosing Inpatient   Does not apply q1800   Infusions:  . heparin 1,400 Units/hr (03/29/15 0439)    Assessment: Patient currently on Heparin drip running at 1400 units/hr. Anti-Xa level resulted at 0.30 within therapeutic range. Patient currently ordered Coumadin 5mg  q1800. INR resulted at 1.24, still subtherapeutic.   Goal of Therapy:  INR 2-3 Heparin level 0.3-0.7 units/ml Monitor platelets by anticoagulation  protocol: Yes   Plan:  Will continue Heparin at current rate and recheck anti-Xa and CBC with am labs. Will increase to Coumadin 10mg  q1800 per MD request and recheck INR with am labs.  Paulina Fusi, PharmD, BCPS 03/30/2015 1:47 PM

## 2015-03-30 NOTE — Care Management Note (Signed)
Patient is active at [facility]  on TTS schedule.  I will update clinic with additional medical records at discharge.  Iran Sizer  Dialysis Liaison  [phone]

## 2015-03-30 NOTE — Progress Notes (Signed)
   03/30/15 0930  Clinical Encounter Type  Visited With Patient  Visit Type Initial  Provided pastoral support and presence to patient on unit.  [place] [phone]

## 2015-03-30 NOTE — Progress Notes (Signed)
Physical Therapy Treatment Patient Details Name: Toni Carlson MRN: [id number] DOB: 1945/06/22 Today's Date: 03/30/2015    History of Present Illness This patient is a 69 year old female who came to [facility] with facial droop. 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    PT Comments    Pt has made progress towards therapy goals. She is ambulating greater distances and requiring less assistance with bed mobility and transfers. Pt demonstrates 5 time sit-to-stand of 58 seconds, which is a considerable amount of time but she is able to perform all of them with good control and minimal fatigue after. Pt still with minor weakness in LUE. Due to her mobility and strength deficits she will continue to benefit from skilled PT in order to eventually return home safely.   Follow Up Recommendations  Home health PT;Supervision for mobility/OOB     Equipment Recommendations  Rolling walker with 5" wheels (Elevated toilet seat )    Recommendations for Other Services       Precautions / Restrictions Precautions Precautions: Fall Restrictions Weight Bearing Restrictions: No    Mobility  Bed Mobility Overal bed mobility: Needs Assistance Bed Mobility: Supine to Sit     Supine to sit: Modified independent (Device/Increase time)     General bed mobility comments: Pt able to perform bed mobility without assistance and with good use of her hands on the side rails   Transfers Overall transfer level: Needs assistance Equipment used: Rolling walker (2 wheeled) Transfers: Sit to/from Stand Sit to Stand: Min guard         General transfer comment: Pt able to perform stands with cues for hand placement and increased time. Pt is stable with transfers. Needs cues to control her decent when sitting  Ambulation/Gait Ambulation/Gait assistance: Min guard Ambulation Distance (Feet): 100 Feet Assistive device: Rolling walker (2 wheeled) Gait Pattern/deviations: Step-through  pattern;Decreased step length - right;Decreased step length - left Gait velocity: decreased Gait velocity interpretation: Below normal speed for age/gender General Gait Details: Pt still with slow gait, but doing better with endurance. Pt likes ambulating with RW as she says that it "feels safer than my cane".    Stairs            Wheelchair Mobility    Modified Rankin (Stroke Patients Only)       Balance Overall balance assessment: History of Falls                                  Cognition Arousal/Alertness: Awake/alert Behavior During Therapy: WFL for tasks assessed/performed Overall Cognitive Status: Within Functional Limits for tasks assessed                      Exercises Other Exercises Other Exercises: Pt performed bilateral therex x 15 reps at supervision for proper technique. Exercises included: ankle pumps, SLR, hip abd, closed chain plantar flexion, press ups from chair, and scap retraction    General Comments        Pertinent Vitals/Pain Pain Assessment: No/denies pain    Home Living                      Prior Function            PT Goals (current goals can now be found in the care plan section) Acute Rehab PT Goals Patient Stated Goal: to go home PT Goal Formulation:  With patient Time For Goal Achievement: 04/11/15 Potential to Achieve Goals: Good Progress towards PT goals: Progressing toward goals    Frequency  7X/week    PT Plan Current plan remains appropriate    Co-evaluation             End of Session Equipment Utilized During Treatment: Gait belt Activity Tolerance: Patient tolerated treatment well;Patient limited by pain Patient left: in chair;with call bell/phone within reach;with chair alarm set     Time: [id number] PT Time Calculation (min) (ACUTE ONLY): 24 min  Charges:                       G CodesJanyth Contes April 09, 2015, 12:26 PM  Janyth Contes,  SPT. [phone]

## 2015-03-30 NOTE — Plan of Care (Signed)
Problem: Education: Goal: Knowledge of [place] [facility] Education information/materials will improve Outcome: Progressing High fall risk with bed alarm activated. Compliant will call or wait for assistance. HD patient on a renal diet and 1226ml fluid restriction. Verbalizes understanding of fluid restrictions and diet.   Problem: Safety: Goal: Ability to remain free from injury will improve Outcome: Progressing High fall risk with bed alarm activated. Calls or waits for assistance. Remained free of injury of fall this shift.     Problem: Education: Goal: Knowledge of patient specific risk factors addressed and post discharge goals established will improve Outcome: Progressing Heparin gtt continues at 48ml/hr. Verbalized understanding of stroke and the need for her blood to be thinner. Continues to be Aflutter on off unit telemetry.   Verbalized that she takes a baby aspirin for her heart and education provided concerning new medication coumadin. Verbalized understanding. Reinforcement needed.

## 2015-03-30 NOTE — Care Management Important Message (Signed)
Important Message  Patient Details  Name: Toni Carlson MRN: [id number] Date of Birth: Apr 26, 1946   Medicare Important Message Given:  Yes    Shelbie Ammons, RN 03/30/2015, 8:45 AM

## 2015-03-30 NOTE — Progress Notes (Signed)
[place] at [place] NAME: Toni Carlson    MR#:  [id number]  DATE OF BIRTH:  19-Jul-1945  SUBJECTIVE:  CHIEF COMPLAINT:   Chief Complaint  Patient presents with  . Code Stroke   the patient feels better overall, still has some slurred speech, no dysphagia. Left-sided weakness, which is improving, left facial droop, improving.  Occupational therapist recommended skilled nursing facility placement. Physical therapist recommendations pending. Patient had hemodialysis yesterday, feels satisfactory. Pro time is still low. Coumadin is being advanced. Blood pressure is elevated, permissive hypertension. Cardiologist is advancing Coreg to 6.25 twice daily dose  REVIEW OF SYSTEMS:  CONSTITUTIONAL: No fever, fatigue or weakness.  EYES: No blurred or double vision.  EARS, NOSE, AND THROAT: No tinnitus or ear pain.  RESPIRATORY: No cough, shortness of breath, wheezing or hemoptysis.  CARDIOVASCULAR: No chest pain, orthopnea, edema.  GASTROINTESTINAL: No nausea, vomiting, diarrhea or abdominal pain.  GENITOURINARY: No dysuria, hematuria.  ENDOCRINE: No polyuria, nocturia,  HEMATOLOGY: No anemia, easy bruising or bleeding SKIN: No rash or lesion. MUSCULOSKELETAL: No joint pain or arthritis.   NEUROLOGIC: No tingling, numbness, weakness. But has slurred speech. PSYCHIATRY: No anxiety or depression.   DRUG ALLERGIES:  No Known Allergies  VITALS:  Blood pressure 146/57, pulse 71, temperature 98.3 F (36.8 C), temperature source Oral, resp. rate 18, height 5\' 6"  (1.676 m), weight 66.906 kg (147 lb 8 oz), SpO2 98 %.  PHYSICAL EXAMINATION:  GENERAL:  69 y.o.-year-old patient lying in the bed with no acute distress.  EYES: Pupils equal, round, reactive to light and accommodation. No scleral icterus. Extraocular muscles intact.  HEENT: Head atraumatic, normocephalic. Oropharynx and nasopharynx clear.  NECK:  Supple, no jugular venous distention. No  thyroid enlargement, no tenderness.  LUNGS: Normal breath sounds bilaterally, no wheezing, rales,rhonchi or crepitation. No use of accessory muscles of respiration.  CARDIOVASCULAR: S1, S2 normal. No murmurs, rubs, or gallops.  ABDOMEN: Soft, nontender, nondistended. Bowel sounds present. No organomegaly or mass.  EXTREMITIES: No pedal edema, cyanosis, or clubbing.  NEUROLOGIC: Cranial nerves II through XII . Left facial weakness sparing forehead. Muscle strength 3/5 in all left upper extremity and forearm, and bowel movements in proximal arm, But improved grip and forearm strength.  Sensation grossly intact. Gait not checked.  PSYCHIATRIC: The patient is alert and oriented x 3.  SKIN: No obvious rash, lesion, or ulcer.    LABORATORY PANEL:   CBC  Recent Labs Lab 03/30/15 0525  WBC 4.9  HGB 9.8*  HCT 31.1*  PLT 109*   ------------------------------------------------------------------------------------------------------------------  Chemistries   Recent Labs Lab 03/26/15 1149  03/29/15 0443  NA 141  < > 135  K 3.2*  < > 4.3  CL 98*  < > 98*  CO2 30  < > 24  GLUCOSE 100*  < > 113*  BUN 30*  < > 74*  CREATININE 4.88*  < > 8.75*  CALCIUM 9.7  < > 9.4  AST 18  --   --   ALT 13*  --   --   ALKPHOS 294*  --   --   BILITOT 0.8  --   --   < > = values in this interval not displayed. ------------------------------------------------------------------------------------------------------------------  Cardiac Enzymes  Recent Labs Lab 03/26/15 2330  TROPONINI 0.14*   ------------------------------------------------------------------------------------------------------------------  RADIOLOGY:  No results found.  EKG:   Orders placed or performed during the hospital encounter of 03/26/15  . ED EKG  . ED  EKG  . EKG 12-Lead  . EKG 12-Lead  . EKG 12-Lead  . EKG 12-Lead  . EKG 12-Lead  . 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  -CT of the head with right frontotemporal infarct. MRI: right MCA infart. Passed speech study,  follow-up physical therapy consult and occupational therapy consult, recommended skilled nursing facility placement for rehabilitation. Carotid Dopplers showed left side carotid artery stenosis 50-69%; vascular surgery recommend follow-up as outpatient echocardiogram: EF 60% to 65%. Continue heparin IV drip/Coumadin load and Lipitor. Neurology recommends anticoagulation with Coumadin for atrial fibrillation, vascular surgery, no intervention, but follow-up as outpatient. Awaiting for pro time to be therapeutic. Discharge to skilled nursing facility when pro time is 2.0 and above, social worker is involved for placement  #2 end-stage renal disease-on Tuesday Thursday Saturday hemodialysis. Continue PhosLo and Renvela.  #3 . Essential hypertension. Continue Norvasc, losartan and hydralazine. Permissive hypertension  #4 anemia of chronic disease-hemoglobin is stable.  #5*New onset of A. Fib. Rate is controlled. Advanced Coreg to 6.25 mg twice a day dose. B Cundiff correlated with heparin and receiving Coumadin load, awaiting for therapeutic level  #6  mild elevated troponin. Due to CVA or ESRD. Echo is unremarkable, no further interventions at present, however, patient will undergo cardiac evaluation as outpatient and possible right-sided heart catheter due to pulmonary hypertension  #7. Pulmonary hypertension, etiology, rule out thromboembolic process in the lungs versus obstructive sleep apnea, patient is being loaded with Coumadin therapy at present, she is to continue to definitely workup as outpatient, may need to have right-sided heart catheterization per cardiologist  All the records are reviewed and case discussed with Care Management/Social Workerr. Management plans discussed with the patient, family and they are in agreement. Greater than 50% time was spent on coordination of care and  face-to-face counseling.  CODE STATUS: Full code  TOTAL TIME TAKING CARE OF THIS PATIENT: 35 minutes.   POSSIBLE D/C IN 3 DAYS, DEPENDING ON CLINICAL CONDITION and pro time level   Obie Kallenbach M.D on 03/30/2015 at 2:54 PM  Between 7am to 6pm - Pager - [phone]  After 6pm go to [email or web address] - password [email or web address] [facility]  Office  [phone]  CC: Primary care physician; Murlean Iba, MD

## 2015-03-30 NOTE — Plan of Care (Signed)
Problem: Education: Goal: Knowledge of [place] [facility] Education information/materials will improve Outcome: Progressing Pt is high fall risk with bed alarm activated, pt compliant will call or wait for assistance. Pt is also a HD patient on a renal diet and 1231ml fluid restriction, pt verbalizes understanding of fluid restrictions and diet.   Problem: Safety: Goal: Ability to remain free from injury will improve Outcome: Progressing Pt high fall risk with bed alarm activated, pt does call or wait for assistance. Pt has remained free of injury of fall this shift.     Problem: Education: Goal: Knowledge of disease or condition will improve Outcome: Progressing Heparin gtt continues at 68ml/hr, pt verbalized understanding of stroke and the need for her blood to be thinner. Pt continues to be Aflutter on off unit telemetry.  Goal: Knowledge of patient specific risk factors addressed and post discharge goals established will improve Outcome: Progressing Pt verbalized that she takes a baby aspirin for her heart and education provided concerning new medication coumadin. Pt verbalized understanding, reinforcement needed.

## 2015-03-31 LAB — CBC
HEMATOCRIT: 31.1 % — AB (ref 35.0–47.0)
HEMOGLOBIN: 9.6 g/dL — AB (ref 12.0–16.0)
MCH: 28 pg (ref 26.0–34.0)
MCHC: 31 g/dL — ABNORMAL LOW (ref 32.0–36.0)
MCV: 90.2 fL (ref 80.0–100.0)
Platelets: 108 10*3/uL — ABNORMAL LOW (ref 150–440)
RBC: 3.45 MIL/uL — ABNORMAL LOW (ref 3.80–5.20)
RDW: 20 % — ABNORMAL HIGH (ref 11.5–14.5)
WBC: 5.2 10*3/uL (ref 3.6–11.0)

## 2015-03-31 LAB — PROTIME-INR
INR: 1.42
PROTHROMBIN TIME: 17.4 s — AB (ref 11.4–15.0)

## 2015-03-31 LAB — PARATHYROID HORMONE, INTACT (NO CA): PTH: 1756 pg/mL — AB (ref 15–65)

## 2015-03-31 LAB — HEPARIN LEVEL (UNFRACTIONATED): Heparin Unfractionated: 0.32 IU/mL (ref 0.30–0.70)

## 2015-03-31 MED ORDER — HALOPERIDOL LACTATE 5 MG/ML IJ SOLN
1.0000 mg | Freq: Four times a day (QID) | INTRAMUSCULAR | Status: DC | PRN
  Filled 2015-03-31: qty 0.2

## 2015-03-31 NOTE — Progress Notes (Signed)
PT Cancellation Note  Patient Details Name: Toni Carlson MRN: [id number] DOB: 07/28/1945   Cancelled Treatment:    Reason Eval/Treat Not Completed: Other (comment) (See PT note for further details) Pt out of room for dialysis; will re-attempt at later time/date when pt available.    Janyth Contes 03/31/2015, 9:36 AM Janyth Contes, SPT. [phone]

## 2015-03-31 NOTE — Progress Notes (Signed)
[place] at [place] NAME: Toni Carlson    MR#:  [id number]  DATE OF BIRTH:  1945-05-30  SUBJECTIVE:  CHIEF COMPLAINT:   Chief Complaint  Patient presents with  . Code Stroke   the patient feels satisfactory today, but wants to go home, still has some slurred speech, no dysphagia. Left-sided weakness, which is improving, left facial droop, improving.  Occupational therapist recommended skilled nursing facility placement. Physical therapist recommends home health physical therapy . Patient had hemodialysis today. Pro time is still low, but better. Being continued on heparin/Coumadin. Blood pressure is elevated, permissive hypertension. Cardiologist , advanced Coreg to 6.25 twice daily dose, blood pressure remains high at 170s to 180s. Patient is agitated today, requests to be released home  REVIEW OF SYSTEMS:  CONSTITUTIONAL: No fever, fatigue or weakness.  EYES: No blurred or double vision.  EARS, NOSE, AND THROAT: No tinnitus or ear pain.  RESPIRATORY: No cough, shortness of breath, wheezing or hemoptysis.  CARDIOVASCULAR: No chest pain, orthopnea, edema.  GASTROINTESTINAL: No nausea, vomiting, diarrhea or abdominal pain.  GENITOURINARY: No dysuria, hematuria.  ENDOCRINE: No polyuria, nocturia,  HEMATOLOGY: No anemia, easy bruising or bleeding SKIN: No rash or lesion. MUSCULOSKELETAL: No joint pain or arthritis.   NEUROLOGIC: No tingling, numbness, weakness. But has slurred speech. PSYCHIATRY: No anxiety or depression.   DRUG ALLERGIES:  No Known Allergies  VITALS:  Blood pressure 166/90, pulse 58, temperature 98.1 F (36.7 C), temperature source Oral, resp. rate 20, height 5\' 6"  (1.676 m), weight 66.906 kg (147 lb 8 oz), SpO2 99 %.  PHYSICAL EXAMINATION:  GENERAL:  69 y.o.-year-old patient lying in the bed with no acute distress.  EYES: Pupils equal, round, reactive to light and accommodation. No scleral icterus. Extraocular  muscles intact.  HEENT: Head atraumatic, normocephalic. Oropharynx and nasopharynx clear.  NECK:  Supple, no jugular venous distention. No thyroid enlargement, no tenderness.  LUNGS: Normal breath sounds bilaterally, no wheezing, rales,rhonchi or crepitation. No use of accessory muscles of respiration.  CARDIOVASCULAR: S1, S2 normal. No murmurs, rubs, or gallops.  ABDOMEN: Soft, nontender, nondistended. Bowel sounds present. No organomegaly or mass.  EXTREMITIES: No pedal edema, cyanosis, or clubbing.  NEUROLOGIC: Cranial nerves II through XII . Left facial weakness sparing forehead. Muscle strength 3/5 in all left upper extremity and forearm, and bowel movements in proximal arm, But improved grip and forearm strength.  Sensation grossly intact. Gait not checked.  PSYCHIATRIC: The patient is alert and oriented x 3.  SKIN: No obvious rash, lesion, or ulcer.    LABORATORY PANEL:   CBC  Recent Labs Lab 03/31/15 0545  WBC 5.2  HGB 9.6*  HCT 31.1*  PLT 108*   ------------------------------------------------------------------------------------------------------------------  Chemistries   Recent Labs Lab 03/26/15 1149  03/29/15 0443  NA 141  < > 135  K 3.2*  < > 4.3  CL 98*  < > 98*  CO2 30  < > 24  GLUCOSE 100*  < > 113*  BUN 30*  < > 74*  CREATININE 4.88*  < > 8.75*  CALCIUM 9.7  < > 9.4  AST 18  --   --   ALT 13*  --   --   ALKPHOS 294*  --   --   BILITOT 0.8  --   --   < > = values in this interval not displayed. ------------------------------------------------------------------------------------------------------------------  Cardiac Enzymes  Recent Labs Lab 03/26/15 2330  TROPONINI 0.14*   ------------------------------------------------------------------------------------------------------------------  RADIOLOGY:  No results found.  EKG:   Orders placed or performed during the hospital encounter of 03/26/15  . ED EKG  . ED EKG  . EKG 12-Lead  . EKG  12-Lead  . EKG 12-Lead  . EKG 12-Lead  . EKG 12-Lead  . EKG 12-Lead    ASSESSMENT AND PLAN:   #1 Acute embolic CVA with left-sided weakness.  -CT of the head with right frontotemporal infarct. MRI: right MCA infart. Passed speech study,  physical therapy recommends home health physical therapy , but occupational therapy recommends skilled nursing facility. Patient wants to go home. Carotid Dopplers showed left side carotid artery stenosis 50-69%; vascular surgery recommend follow-up as outpatient echocardiogram: EF 60% to 65%. Continue heparin IV drip/Coumadin load and Lipitor. Neurology recommends anticoagulation with Coumadin for atrial fibrillation, vascular surgery, no intervention, but follow-up as outpatient. Awaiting for pro time to be therapeutic. Discharge to home likely with home health physical therapy and occupational therapy when pro time is 2.0 and above. Care management is involved  #2 end-stage renal disease-on Tuesday Thursday Saturday hemodialysis. Continue PhosLo and Renvela. Status post hemodialysis today 15th of November 2016  #3 . Malignant essential hypertension. Continue Norvasc, losartan and hydralazine. Permissive hypertension. Advanced medications if no improvement with hemodialysis  #4 anemia of chronic disease-hemoglobin is stable.  #5*New onset of A. Fib. Rate is controlled. Advanced Coreg to 6.25 mg twice a day dose. Being continued on heparin and receiving Coumadin load, awaiting for therapeutic level  #6  mild elevated troponin. Due to CVA or ESRD. Echo is unremarkable, no further interventions at present, however, patient will undergo cardiac evaluation as outpatient and possible right-sided heart catheter due to pulmonary hypertension  #7. Pulmonary hypertension, etiology, rule out thromboembolic process in the lungs versus obstructive sleep apnea, patient is being loaded with Coumadin therapy at present, she is to continue to definitely workup as  outpatient, may need to have right-sided heart catheterization per cardiologist  #8 agitation, etiology is unclear, start patient on Haldol as needed. Supportive therapy as well as emotional support provided .    All the records are reviewed and case discussed with Care Management/Social Workerr. Management plans discussed with the patient, family and they are in agreement. Greater than 50% time was spent on coordination of care and face-to-face counseling.  CODE STATUS: Full code  TOTAL TIME TAKING CARE OF THIS PATIENT: 40 minutes.   POSSIBLE D/C IN 3 DAYS, DEPENDING ON CLINICAL CONDITION and pro time level   Yanina Knupp M.D on 03/31/2015 at 1:35 PM  Between 7am to 6pm - Pager - [phone]  After 6pm go to [email or web address] - password [email or web address] [facility]  Office  [phone]  CC: Primary care physician; Murlean Iba, MD

## 2015-03-31 NOTE — Plan of Care (Signed)
Problem: Education: Goal: Knowledge of [facility] [place] [facility] Education information/materials will improve Outcome: Progressing High fall risk with bed alarm activated. Compliant will call or wait for assistance. HD patient on a renal diet and 1269ml fluid restriction. Verbalizes understanding of fluid restrictions and diet.   Problem: Safety: Goal: Ability to remain free from injury will improve Outcome: Progressing High fall risk with bed alarm activated. Calls or waits for assistance. Remained free of injury of fall this shift.     Problem: Education: Goal: Knowledge of disease or condition will improve Heparin gtt continues at 77ml/hr. Verbalized understanding of stroke and the need for her blood to be thinner. Continues to be Aflutter on off unit telemetry.    Verbalized that she takes a baby aspirin for her heart and education provided concerning new medication coumadin. Verbalized understanding. Reinforcement needed.

## 2015-03-31 NOTE — Progress Notes (Signed)
ANTICOAGULATION CONSULT NOTE - Follow Up Consult  Pharmacy Consult for Heparin/Coumadin Indication: atrial fibrillation  No Known Allergies  Patient Measurements: Height: 5\' 6"  (167.6 cm) Weight: 147 lb 8 oz (66.906 kg) IBW/kg (Calculated) : 59.3 Heparin Dosing Weight: 66.9 kg  Vital Signs: Temp: 98.5 F (36.9 C) (11/15 0536) Temp Source: Oral (11/15 0536) BP: 156/66 mmHg (11/15 0536) Pulse Rate: 65 (11/15 0536)  Labs:  Recent Labs  03/29/15 0443  03/29/15 1604 03/30/15 0525 03/31/15 0545  HGB 9.8*  --   --  9.8* 9.6*  HCT 32.4*  --   --  31.1* 31.1*  PLT 128*  --   --  109* 108*  LABPROT 15.8*  --   --  15.8* 17.4*  INR 1.24  --   --  1.24 1.42  HEPARINUNFRC  --   < > 0.34 0.30 0.32  CREATININE 8.75*  --   --   --   --   < > = values in this interval not displayed.  Estimated Creatinine Clearance: 5.8 mL/min (by C-G formula based on Cr of 8.75).   Medications:  Scheduled:  . amLODipine  10 mg Oral Daily  . atorvastatin  40 mg Oral q1800  . calcium acetate (Phos Binder)  2,668 mg Oral TID WC  . carvedilol  6.25 mg Oral BID WC  . docusate sodium  100 mg Oral BID  . hydrALAZINE  50 mg Oral 3 times per day  . losartan  100 mg Oral Daily  . pantoprazole  40 mg Oral BID  . sodium chloride  3 mL Intravenous Q12H  . warfarin  10 mg Oral q1800  . Warfarin - Pharmacist Dosing Inpatient   Does not apply q1800   Infusions:  . heparin 1,400 Units/hr (03/30/15 2058)    Assessment: Patient currently on Heparin drip running at 1400 units/hr. Anti-Xa level resulted at 0.30 within therapeutic range. Patient currently ordered Coumadin 5mg  q1800. INR resulted at 1.24, still subtherapeutic.   Goal of Therapy:  INR 2-3 Heparin level 0.3-0.7 units/ml Monitor platelets by anticoagulation protocol: Yes   Plan:  Will continue Heparin at current rate and recheck anti-Xa and CBC with am labs. Will increase to Coumadin 10mg  q1800 per MD request and recheck INR with am  labs.  11/15 AM heparin level 0.32.  INR 1.42. Continue current regimen and recheck tomorrow AM.  Sim Boast, PharmD, BCPS  03/31/2015

## 2015-03-31 NOTE — Progress Notes (Signed)
Patient c/o soreness to left calf on assessment this morning. Mild non pitting edema noted. Notified Dr. Ether Griffins. Will continue to monitor.

## 2015-03-31 NOTE — Progress Notes (Signed)
[facility]  ROUNDING NOTE   Subjective:  Patient seen during dialysis. Had increased bleeding after her access was cannulated today. Has hx of aneurysmal access. Otherwise tolerating HD well.   Objective:  Vital signs in last 24 hours:  Temp:  [97.6 F (36.4 C)-98.5 F (36.9 C)] 97.8 F (36.6 C) (11/15 0900) Pulse Rate:  [63-99] 76 (11/15 1130) Resp:  [18-20] 20 (11/15 0807) BP: (146-180)/(57-86) 180/86 mmHg (11/15 0807) SpO2:  [96 %-98 %] 97 % (11/15 0807)  Weight change:  Filed Weights   03/29/15 1045 03/29/15 1415 03/29/15 1449  Weight: 69.3 kg (152 lb 12.5 oz) 67.4 kg (148 lb 9.4 oz) 66.906 kg (147 lb 8 oz)    Intake/Output: I/O last 3 completed shifts: In: 888 [P.O.:720; I.V.:168] Out: -    Intake/Output this shift:     Physical Exam: General: NAD sitting up  Head: Normocephalic, atraumatic. Moist oral mucosal membranes  Eyes: Anicteric  Neck: Supple, trachea midline  Lungs:  Clear to auscultation normal effort  Heart: Irregular no rubs  Abdomen:  Soft, nontender, BS present   Extremities: trace peripheral edema.  Neurologic: Left sided weakness, left sided facial droop  Skin: No lesions  Access: Left arm AVF    Basic Metabolic Panel:  Recent Labs Lab 03/26/15 1149 03/27/15 0534 03/28/15 0424 03/29/15 0443 03/30/15 0525  NA 141 138  --  135  --   K 3.2* 3.5  --  4.3  --   CL 98* 98*  --  98*  --   CO2 30 25  --  24  --   GLUCOSE 100* 99  --  113*  --   BUN 30* 47*  --  74*  --   CREATININE 4.88* 6.29* 7.29* 8.75*  --   CALCIUM 9.7 9.7  --  9.4  --   PHOS  --   --   --  5.0* 4.2    Liver Function Tests:  Recent Labs Lab 03/26/15 1149 03/29/15 0443  AST 18  --   ALT 13*  --   ALKPHOS 294*  --   BILITOT 0.8  --   PROT 7.2  --   ALBUMIN 3.4* 3.0*   No results for input(s): LIPASE, AMYLASE in the last 168 hours. No results for input(s): AMMONIA in the last 168 hours.  CBC:  Recent Labs Lab 03/26/15 1149  03/27/15 0534 03/28/15 0424 03/29/15 0443 03/30/15 0525 03/31/15 0545  WBC 5.6 4.4 5.3 6.1 4.9 5.2  NEUTROABS 4.6  --   --   --  4.1  --   HGB 11.5* 10.7* 10.2* 9.8* 9.8* 9.6*  HCT 36.5 34.7* 32.2* 32.4* 31.1* 31.1*  MCV 89.2 89.9 90.1 91.4 91.0 90.2  PLT 146* 127* 119* 128* 109* 108*    Cardiac Enzymes:  Recent Labs Lab 03/26/15 1149 03/26/15 1746 03/26/15 2330  TROPONINI 0.14* 0.14* 0.14*    BNP: Invalid input(s): POCBNP  CBG: No results for input(s): GLUCAP in the last 168 hours.  Microbiology: Results for orders placed or performed in visit on 09/19/11  MRSA PCR Screening     Status: None   Collection Time: 09/19/11 11:25 AM  Result Value Ref Range Status   Micro Text Report   Final       SOURCE: nasal    COMMENT                   NEGATIVE - MRSA target DNA not detected   ANTIBIOTIC  Coagulation Studies:  Recent Labs  03/29/15 0443 03/30/15 0525 03/31/15 0545  LABPROT 15.8* 15.8* 17.4*  INR 1.24 1.24 1.42    Urinalysis: No results for input(s): COLORURINE, LABSPEC, PHURINE, GLUCOSEU, HGBUR, BILIRUBINUR, KETONESUR, PROTEINUR, UROBILINOGEN, NITRITE, LEUKOCYTESUR in the last 72 hours.  Invalid input(s): APPERANCEUR    Imaging: No results found.   Medications:   . heparin 1,400 Units/hr (03/30/15 [id number])   . amLODipine  10 mg Oral Daily  . atorvastatin  40 mg Oral q1800  . calcium acetate (Phos Binder)  2,668 mg Oral TID WC  . carvedilol  6.25 mg Oral BID WC  . docusate sodium  100 mg Oral BID  . hydrALAZINE  50 mg Oral 3 times per day  . losartan  100 mg Oral Daily  . pantoprazole  40 mg Oral BID  . sodium chloride  3 mL Intravenous Q12H  . warfarin  10 mg Oral q1800  . Warfarin - Pharmacist Dosing Inpatient   Does not apply q1800   sodium chloride, sodium chloride, acetaminophen **OR** acetaminophen, alteplase, haloperidol lactate, heparin, lidocaine (PF), lidocaine-prilocaine,  ondansetron **OR** ondansetron (ZOFRAN) IV, pentafluoroprop-tetrafluoroeth, polyethylene glycol, trolamine salicylate, zolpidem  Assessment/ Plan:  Ms. Genene Wyles Sangalang is a 69 y.o. 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 ESRD on HD TTHS:  Patient seen during dialysis today. She had increased bleeding during access cannulation. We will consult vascular surgery for this issue.   2. Acute CVA: Right MCA infarction. Due to new onset atrial fibrillation?  - remains on heparin gtt and warfarin.   3. Anemia of chronic kidney disease: Hemoglobin currently 9.6. We are avoiding Epogen for now given acute ischemic event.  4. Secondary Hyperparathyroidism: PTH 4769. Does not tolerate cinacalcet - Continue PhosLo as phosphorus is under good control.  5. Hypertension: Blood pressure elevated this a.m. at 180/86 however patient undergoing dialysis and this should improve blood pressure control.   - Continue amlodipine, hydralazine, coreg, and losartan.  UF with dialysis should also help.   LOS: 5 Omnia Dollinger 11/15/201611:57 AM

## 2015-03-31 NOTE — Plan of Care (Signed)
Problem: Education: Goal: Knowledge of patient specific risk factors addressed and post discharge goals established will improve Outcome: Progressing Hemodynamics: 97.8-65-18-156/79-98 % on RA. Heart rate dip down to < 40 but did not sustain, tele clerk sent a 6 second strip.  Pain: No complaints of pain.  Diet: Renal diet with fluid restrictions- pt tolerating.  AV fistula left arm with positive thrill and bruit. Pt for possible discharge today after hemodialysis. NIH score of 4. Re-educated patient on stroke symptoms; continue to reinforce learning.

## 2015-03-31 NOTE — Progress Notes (Signed)
PT Cancellation Note  Patient Details Name: Toni Carlson MRN: [id number] DOB: [birth year]   Cancelled Treatment:    Reason Eval/Treat Not Completed: Other (comment) (See PT note for further details) Pt attempted this afternoon, however pt stated she is feeling very tired and c/o of not having any sleep and being in leg pain. She wishes to try back later. Will attempt at later time/date when more appropriate.    Janyth Contes 03/31/2015, 5:12 PM Janyth Contes, SPT. [phone]

## 2015-03-31 NOTE — Progress Notes (Signed)
Initial Nutrition Assessment   INTERVENTION:   Meals and Snacks: Cater to patient preferences Medical Food Supplement Therapy:  Will recommend Nepro supplementation on follow if intake poor.   NUTRITION DIAGNOSIS:   No nutrition diagnosis at this time  GOAL:   Patient will meet greater than or equal to 90% of their needs   MONITOR:    (Energy Intake, Anthropometrics, Electrolyte and Renal Profile)  REASON FOR ASSESSMENT:    (Diet Order)    ASSESSMENT:   Pt admitted with acute CVA. Pt with h/o ESRD on HD. Pt out of room at HD on rounds today.  Past Medical History  Diagnosis Date  . Hypertension   . ESRD (end stage renal disease) ([place])     On Tue-Thur-Sat dialysis  . CHF (congestive heart failure) ([facility])     Diastolic heart failure  . Anemia   . Hyperparathyroidism, secondary renal ([place])      Diet Order:  Diet renal with fluid restriction Fluid restriction:: 1200 mL Fluid; Room service appropriate?: Yes; Fluid consistency:: Thin Diet - low sodium heart healthy    Current Nutrition: Recorded po intake 73% of meals documented in I/O chart.   Food/Nutrition-Related History: Per MST no decrease in appetite PTA.    Scheduled Medications:  . amLODipine  10 mg Oral Daily  . atorvastatin  40 mg Oral q1800  . calcium acetate (Phos Binder)  2,668 mg Oral TID WC  . carvedilol  6.25 mg Oral BID WC  . docusate sodium  100 mg Oral BID  . hydrALAZINE  50 mg Oral 3 times per day  . losartan  100 mg Oral Daily  . pantoprazole  40 mg Oral BID  . sodium chloride  3 mL Intravenous Q12H  . warfarin  10 mg Oral q1800  . Warfarin - Pharmacist Dosing Inpatient   Does not apply q1800    Continuous Medications:  . heparin 1,400 Units/hr (03/30/15 2058)     Electrolyte/Renal Profile and Glucose Profile:   Recent Labs Lab 03/26/15 1149 03/27/15 0534 03/28/15 0424 03/29/15 0443 03/30/15 0525  NA 141 138  --  135  --   K 3.2* 3.5  --  4.3  --   CL 98* 98*  --  98*   --   CO2 30 25  --  24  --   BUN 30* 47*  --  74*  --   CREATININE 4.88* 6.29* 7.29* 8.75*  --   CALCIUM 9.7 9.7  --  9.4  --   PHOS  --   --   --  5.0* 4.2  GLUCOSE 100* 99  --  113*  --    Protein Profile:  Recent Labs Lab 03/26/15 1149 03/29/15 0443  ALBUMIN 3.4* 3.0*    Gastrointestinal Profile: Last BM: 03/30/2015   Weight Change: Per MST and CHL stable weight   Height:   Ht Readings from Last 1 Encounters:  03/26/15 5\' 6"  (1.676 m)    Weight:   Wt Readings from Last 1 Encounters:  03/29/15 147 lb 8 oz (66.906 kg)   Filed Weights   03/29/15 1045 03/29/15 1415 03/29/15 1449  Weight: 152 lb 12.5 oz (69.3 kg) 148 lb 9.4 oz (67.4 kg) 147 lb 8 oz (66.906 kg)   Noted Pre-dialysis weight of 152lbs, post-dialysis weight 147lbs.   Wt Readings from Last 10 Encounters:  03/29/15 147 lb 8 oz (66.906 kg)  02/23/15 145 lb (65.772 kg)  09/15/14 145 lb (65.772 kg)  BMI:  Body mass index is 23.82 kg/(m^2).    LOW Care Level  Dwyane Luo, [place], [place] Pager [phone]

## 2015-04-01 DIAGNOSIS — R7989 Other specified abnormal findings of blood chemistry: Secondary | ICD-10-CM

## 2015-04-01 DIAGNOSIS — I480 Paroxysmal atrial fibrillation: Secondary | ICD-10-CM

## 2015-04-01 DIAGNOSIS — I10 Essential (primary) hypertension: Secondary | ICD-10-CM

## 2015-04-01 DIAGNOSIS — D638 Anemia in other chronic diseases classified elsewhere: Secondary | ICD-10-CM

## 2015-04-01 DIAGNOSIS — I272 Pulmonary hypertension, unspecified: Secondary | ICD-10-CM

## 2015-04-01 DIAGNOSIS — R531 Weakness: Secondary | ICD-10-CM

## 2015-04-01 DIAGNOSIS — R778 Other specified abnormalities of plasma proteins: Secondary | ICD-10-CM

## 2015-04-01 LAB — HEPARIN LEVEL (UNFRACTIONATED): HEPARIN UNFRACTIONATED: 0.29 [IU]/mL — AB (ref 0.30–0.70)

## 2015-04-01 LAB — CBC
HEMATOCRIT: 29.1 % — AB (ref 35.0–47.0)
Hemoglobin: 9.2 g/dL — ABNORMAL LOW (ref 12.0–16.0)
MCH: 28.5 pg (ref 26.0–34.0)
MCHC: 31.4 g/dL — ABNORMAL LOW (ref 32.0–36.0)
MCV: 90.6 fL (ref 80.0–100.0)
PLATELETS: 92 10*3/uL — AB (ref 150–440)
RBC: 3.22 MIL/uL — AB (ref 3.80–5.20)
RDW: 20.1 % — AB (ref 11.5–14.5)
WBC: 5.8 10*3/uL (ref 3.6–11.0)

## 2015-04-01 LAB — PROTIME-INR
INR: 2.46
Prothrombin Time: 26.4 seconds — ABNORMAL HIGH (ref 11.4–15.0)

## 2015-04-01 MED ORDER — ENSURE COMPLETE PO LIQD: 237.0000 mL | Freq: Three times a day (TID) | ORAL | Status: DC

## 2015-04-01 MED ORDER — CARVEDILOL 6.25 MG PO TABS
6.2500 mg | ORAL_TABLET | Freq: Two times a day (BID) | ORAL | Status: DC
Start: 2015-04-01 — End: 2015-04-03

## 2015-04-01 MED ORDER — ATORVASTATIN CALCIUM 40 MG PO TABS: 40.0000 mg | ORAL_TABLET | Freq: Every day | ORAL | Status: DC

## 2015-04-01 MED ORDER — WARFARIN SODIUM 7.5 MG PO TABS: 7.5000 mg | ORAL_TABLET | Freq: Every day | ORAL | Status: DC

## 2015-04-01 MED ORDER — DIPHENHYDRAMINE HCL 25 MG PO CAPS
ORAL_CAPSULE | ORAL | Status: AC
  Administered 2015-04-01: 25 mg via ORAL
  Filled 2015-04-01: qty 1

## 2015-04-01 MED ORDER — POLYETHYLENE GLYCOL 3350 17 G PO PACK: 17.0000 g | PACK | Freq: Every day | ORAL | Status: DC | PRN

## 2015-04-01 MED ORDER — TROLAMINE SALICYLATE 10 % EX CREA: TOPICAL_CREAM | Freq: Two times a day (BID) | CUTANEOUS | Status: DC | PRN

## 2015-04-01 MED ORDER — DIPHENHYDRAMINE HCL 25 MG PO CAPS
25.0000 mg | ORAL_CAPSULE | Freq: Every evening | ORAL | Status: DC | PRN
  Administered 2015-04-01: 25 mg via ORAL

## 2015-04-01 MED ORDER — HEPARIN BOLUS VIA INFUSION
1000.0000 [IU] | Freq: Once | INTRAVENOUS | Status: DC
  Filled 2015-04-01: qty 1000

## 2015-04-01 MED ORDER — WARFARIN SODIUM 5 MG PO TABS: 7.5000 mg | ORAL_TABLET | Freq: Every day | ORAL | Status: DC

## 2015-04-01 NOTE — Progress Notes (Addendum)
ANTICOAGULATION CONSULT NOTE - Follow Up Consult  Pharmacy Consult for Heparin/Coumadin Indication: atrial fibrillation  No Known Allergies  Patient Measurements: Height: 5\' 6"  (167.6 cm) Weight: 147 lb 8 oz (66.906 kg) IBW/kg (Calculated) : 59.3 Heparin Dosing Weight: 66.9 kg  Vital Signs: Temp: 98.4 F (36.9 C) (11/16 0449) Temp Source: Oral (11/16 0449) BP: 149/65 mmHg (11/16 0449) Pulse Rate: 74 (11/16 0449)  Labs:  Recent Labs  03/30/15 0525 03/31/15 0545 04/01/15 0446  HGB 9.8* 9.6* 9.2*  HCT 31.1* 31.1* 29.1*  PLT 109* 108* 92*  LABPROT 15.8* 17.4* 26.4*  INR 1.24 1.42 2.46  HEPARINUNFRC 0.30 0.32 0.29*    Estimated Creatinine Clearance: 5.8 mL/min (by C-G formula based on Cr of 8.75).   Medications:  Scheduled:  . amLODipine  10 mg Oral Daily  . atorvastatin  40 mg Oral q1800  . calcium acetate (Phos Binder)  2,668 mg Oral TID WC  . carvedilol  6.25 mg Oral BID WC  . docusate sodium  100 mg Oral BID  . hydrALAZINE  50 mg Oral 3 times per day  . losartan  100 mg Oral Daily  . pantoprazole  40 mg Oral BID  . sodium chloride  3 mL Intravenous Q12H  . warfarin  7.5 mg Oral q1800  . Warfarin - Pharmacist Dosing Inpatient   Does not apply q1800   Infusions:  . heparin 1,400 Units/hr (03/31/15 1512)    Assessment: Patient currently on Heparin drip running at 1400 units/hr. Anti-Xa level resulted at 0.30 within therapeutic range. Patient currently ordered Coumadin 5mg  q1800. INR resulted at 1.24, still subtherapeutic.   Goal of Therapy:  INR 2-3 Heparin level 0.3-0.7 units/ml Monitor platelets by anticoagulation protocol: Yes   Plan:  Will continue Heparin at current rate and recheck anti-Xa and CBC with am labs. Will increase to Coumadin 10mg  q1800 per MD request and recheck INR with am labs.  11/15 AM heparin level 0.32.  INR 1.42. Continue current regimen and recheck tomorrow AM.  11/16 AM heparin level 0.29. 1000 unit bolus and increase to  1550 units/hr. Recheck in 8 hours. INR 2.46. Decreased warfarin to 7.5 mg daily.  Sim Boast, PharmD, BCPS  04/01/2015

## 2015-04-01 NOTE — Care Management (Signed)
Spoke with daughter, Orvan Falconer, [phone]). States her mother has been receiving services thru [facility], but they don't come anymore. Would like [facility] for [place], physical therapy, and aide services. Would like prescription for Ensure. Would like rolling walker, shower chair, and raised toilet seat. Will pay co-pay for shower chair, if needed. Will transport to home today.  Shelbie Ammons RN MSN CCM Care Management [phone]

## 2015-04-01 NOTE — Discharge Summary (Signed)
[facility] NAME: Toni Carlson    MR#:  [id number]  DATE OF BIRTH:  29-Jan-1946  DATE OF ADMISSION:  03/26/2015 ADMITTING PHYSICIAN: Gladstone Lighter, MD  DATE OF DISCHARGE: No discharge date for patient encounter.  PRIMARY CARE PHYSICIAN: Murlean Iba, MD     ADMISSION DIAGNOSIS:  CVA (cerebral infarction) [I63.9] Cerebral infarction due to unspecified mechanism [I63.9] Facial weakness due to cerebrovascular disease [I67.9, R29.810]  DISCHARGE DIAGNOSIS:  Principal Problem:   CVA (cerebral infarction) Active Problems:   Left-sided weakness   Atrial fibrillation (HCC)   Essential hypertension, malignant   Anemia of chronic disease   Elevated troponin   Pulmonary hypertension ([facility])   SECONDARY DIAGNOSIS:   Past Medical History  Diagnosis Date  . Hypertension   . ESRD (end stage renal disease) ([facility])     On Tue-Thur-Sat dialysis  . CHF (congestive heart failure) (HCC)     Diastolic heart failure  . Anemia   . Hyperparathyroidism, secondary renal ([place])     .pro HOSPITAL COURSE:  Patient is 69 year old African-American female with past medical history significant for end-stage renal disease,  on hemodialysis, hypertension, anemia, CHF who presents to the hospital with complaints of slurry speech, left facial droop and left facial weakness, also drooling. No TPA was given due to stroke timing. Head CT revealed early infarct in the right frontotemporal area, but MRI revealed acute right MCA infarct without hemorrhage or mass effect. Involvement of insula, operculum, as well as lateral motor strip, there was also evidence of large vessel occlusion at the right MCA bifurcation. Patient was noted to be in atrial fibrillation, new onset.. Patient was admitted to the hospital for evaluation and treatment. It was felt that patient's stroke was embolic, patient was initiated on heparin IV drip with Coumadin load as well as  Lipitor. Coumadin was advanced to INR of 2.46 on the day of discharge 16th of November 2016, she was advised to continue Coumadin therapy at home, following INR closely. Patient was seen and followed by neurologist while in the hospital. She was seen by physical therapy therapist, occupational therapist as well as speech therapist and recommended home health follow-up. Discussion by problem #1 Acute embolic CVA with left-sided weakness.  -CT of the head with right frontotemporal infarct. MRI: right MCA infart. Passed speech evaluation recommended renal diet with thin liquids and fluid restrictions, physical and occupational therapy recommended home health follow-up . Carotid Dopplers showed left side carotid artery stenosis 50-69%; vascular surgery recommend follow-up as outpatient,  echocardiogram: EF 60% to 65% , severe concentric LVH, normal wall motion, restrictive physiology with decreased left ventricular diastolic compliance and or increased left atrial pressure. Aortic valve revealed mild stenosis. Mitral valve also showed mild stenosis as well as mild regurgitation. Right ventricular size was mildly dilated and the systolic pressures were severely increased and pulmonary arteries up to 90 mmHg. Patient was seen by cardiologist and recommended follow-up with cardiology for further testing,  including right heart catheterization as outpatient. Patient is to continue Coumadin therapy and keep her INR between 2-3. She was advised to continue Lipitor  #2 end-stage renal disease-on Tuesday Thursday Saturday hemodialysis. Continue PhosLo and Renvela. Status post hemodialysis 15th of November 2016, next dialysis per schedule  #3 . Malignant essential hypertension. Continue Norvasc, losartan and hydralazine. Permissive hypertension with systolic blood pressure at around 150 while in the hospital.  #4 anemia of chronic disease-hemoglobin is stable.  #5*New onset of A. Fib.  Rate is controlled on Coreg at  6.25 mg twice a day dose. Patient is to be continued on Coumadin therapy as above , she is to follow up with cardiologist for further recommendations   #6 mild elevated troponin. Due to CVA or ESRD. Echo was remarkable for severe pulmonary hypertension of unclear etiology, patient will undergo cardiac evaluation as outpatient and possible right-sided heart catheterization   #7. Pulmonary hypertension, unknown etiology, rule out thromboembolic process in the lungs versus obstructive sleep apnea, patient was loaded with Coumadin, she is to continue  workup as outpatient, may need to have right-sided heart catheterization per cardiologist   DISCHARGE CONDITIONS:   Stable  CONSULTS OBTAINED:  Treatment Team:  Lavonia Dana, MD Katha Cabal, MD Thayer Headings, MD Algernon Huxley, MD  DRUG ALLERGIES:  No Known Allergies  DISCHARGE MEDICATIONS:   Current Discharge Medication List    START taking these medications   Details  atorvastatin (LIPITOR) 40 MG tablet Take 1 tablet (40 mg total) by mouth daily at 6 PM. Qty: 30 tablet, Refills: 6    carvedilol (COREG) 6.25 MG tablet Take 1 tablet (6.25 mg total) by mouth 2 (two) times daily with a meal. Qty: 60 tablet, Refills: 6    feeding supplement, ENSURE COMPLETE, (ENSURE COMPLETE) LIQD Take 237 mLs by mouth 3 (three) times daily with meals. Qty: 90 Bottle, Refills: 6    polyethylene glycol (MIRALAX / GLYCOLAX) packet Take 17 g by mouth daily as needed for mild constipation. Qty: 14 each, Refills: 0    trolamine salicylate (ASPERCREME) 10 % cream Apply topically 2 (two) times daily as needed for muscle pain. Qty: 85 g, Refills: 0    warfarin (COUMADIN) 7.5 MG tablet Take 1 tablet (7.5 mg total) by mouth daily at 6 PM. Qty: 30 tablet, Refills: 6      CONTINUE these medications which have NOT CHANGED   Details  losartan (COZAAR) 100 MG tablet Take 100 mg by mouth daily.    NEXIUM 40 MG capsule Take 40 mg by mouth daily at 12  noon.     PHOSLYRA 667 MG/5ML SOLN Take 1,334 mg by mouth 3 (three) times daily with meals.       STOP taking these medications     amLODipine (NORVASC) 10 MG tablet      hydrALAZINE (APRESOLINE) 25 MG tablet          DISCHARGE INSTRUCTIONS:    Patient is to follow-up with [facility], cardiology as outpatient. Follow pro time as outpatient closely to ensure its therapeutic levels  If you experience worsening of your admission symptoms, develop shortness of breath, life threatening emergency, suicidal or homicidal thoughts you must seek medical attention immediately by calling 911 or calling your MD immediately  if symptoms less severe.  You Must read complete instructions/literature along with all the possible adverse reactions/side effects for all the Medicines you take and that have been prescribed to you. Take any new Medicines after you have completely understood and accept all the possible adverse reactions/side effects.   Please note  You were cared for by a hospitalist during your hospital stay. If you have any questions about your discharge medications or the care you received while you were in the hospital after you are discharged, you can call the unit and asked to speak with the hospitalist on call if the hospitalist that took care of you is not available. Once you are discharged, your primary  care physician will handle any further medical issues. Please note that NO REFILLS for any discharge medications will be authorized once you are discharged, as it is imperative that you return to your primary care physician (or establish a relationship with a primary care physician if you do not have one) for your aftercare needs so that they can reassess your need for medications and monitor your lab values.    Today   CHIEF COMPLAINT:   Chief Complaint  Patient presents with  . Code Stroke    HISTORY OF PRESENT ILLNESS:  Toni Carlson  is a 69  y.o. female with a known history of end-stage renal disease,  on hemodialysis, hypertension, anemia, CHF who presents to the hospital with complaints of slurry speech, left facial droop and left facial weakness, also drooling. No TPA was given due to stroke timing. Head CT revealed early infarct in the right frontotemporal area, but MRI revealed acute right MCA infarct without hemorrhage or mass effect. Involvement of insula, operculum, as well as lateral motor strip, there was also evidence of large vessel occlusion at the right MCA bifurcation. Patient was noted to be in atrial fibrillation, new onset.. Patient was admitted to the hospital for evaluation and treatment. It was felt that patient's stroke was embolic, patient was initiated on heparin IV drip with Coumadin load as well as Lipitor. Coumadin was advanced to INR of 2.46 on the day of discharge 16th of November 2016, she was advised to continue Coumadin therapy at home, following INR closely. Patient was seen and followed by neurologist while in the hospital. She was seen by physical therapy therapist, occupational therapist as well as speech therapist and recommended home health follow-up. Discussion by problem #1 Acute embolic CVA with left-sided weakness.  -CT of the head with right frontotemporal infarct. MRI: right MCA infart. Passed speech evaluation recommended renal diet with thin liquids and fluid restrictions, physical and occupational therapy recommended home health follow-up . Carotid Dopplers showed left side carotid artery stenosis 50-69%; vascular surgery recommend follow-up as outpatient,  echocardiogram: EF 60% to 65% , severe concentric LVH, normal wall motion, restrictive physiology with decreased left ventricular diastolic compliance and or increased left atrial pressure. Aortic valve revealed mild stenosis. Mitral valve also showed mild stenosis as well as mild regurgitation. Right ventricular size was mildly dilated and the  systolic pressures were severely increased and pulmonary arteries up to 90 mmHg. Patient was seen by cardiologist and recommended follow-up with cardiology for further testing,  including right heart catheterization as outpatient. Patient is to continue Coumadin therapy and keep her INR between 2-3. She was advised to continue Lipitor  #2 end-stage renal disease-on Tuesday Thursday Saturday hemodialysis. Continue PhosLo and Renvela. Status post hemodialysis 15th of November 2016, next dialysis per schedule  #3 . Malignant essential hypertension. Continue Norvasc, losartan and hydralazine. Permissive hypertension with systolic blood pressure at around 150 while in the hospital.  #4 anemia of chronic disease-hemoglobin is stable.  #5*New onset of A. Fib. Rate is controlled on Coreg at 6.25 mg twice a day dose. Patient is to be continued on Coumadin therapy as above , she is to follow up with cardiologist for further recommendations   #6 mild elevated troponin. Due to CVA or ESRD. Echo was remarkable for severe pulmonary hypertension of unclear etiology, patient will undergo cardiac evaluation as outpatient and possible right-sided heart catheterization   #7. Pulmonary hypertension, unknown etiology, rule out thromboembolic process in the lungs versus obstructive sleep apnea, patient  was loaded with Coumadin, she is to continue  workup as outpatient, may need to have right-sided heart catheterization per cardiologist    VITAL SIGNS:  Blood pressure 153/61, pulse 69, temperature 98 F (36.7 C), temperature source Oral, resp. rate 20, height 5\' 6"  (1.676 m), weight 66.906 kg (147 lb 8 oz), SpO2 100 %.  I/O:   Intake/Output Summary (Last 24 hours) at 04/01/15 1657 Last data filed at 04/01/15 0900  Gross per 24 hour  Intake    480 ml  Output      0 ml  Net    480 ml    PHYSICAL EXAMINATION:  GENERAL:  69 y.o.-year-old patient lying in the bed with no acute distress.  EYES: Pupils equal,  round, reactive to light and accommodation. No scleral icterus. Extraocular muscles intact.  HEENT: Head atraumatic, normocephalic. Oropharynx and nasopharynx clear.  NECK:  Supple, no jugular venous distention. No thyroid enlargement, no tenderness.  LUNGS: Normal breath sounds bilaterally, no wheezing, rales,rhonchi or crepitation. No use of accessory muscles of respiration.  CARDIOVASCULAR: S1, S2 normal. No murmurs, rubs, or gallops.  ABDOMEN: Soft, non-tender, non-distended. Bowel sounds present. No organomegaly or mass.  EXTREMITIES: No pedal edema, cyanosis, or clubbing.  NEUROLOGIC: Cranial nerves II through XII are intact. Muscle strength 5/5 in all extremities. Sensation intact. Gait not checked. Left-sided weakness PSYCHIATRIC: The patient is alert and oriented x 3.  SKIN: No obvious rash, lesion, or ulcer.   DATA REVIEW:   CBC  Recent Labs Lab 04/01/15 0446  WBC 5.8  HGB 9.2*  HCT 29.1*  PLT 92*    Chemistries   Recent Labs Lab 03/26/15 1149  03/29/15 0443  NA 141  < > 135  K 3.2*  < > 4.3  CL 98*  < > 98*  CO2 30  < > 24  GLUCOSE 100*  < > 113*  BUN 30*  < > 74*  CREATININE 4.88*  < > 8.75*  CALCIUM 9.7  < > 9.4  AST 18  --   --   ALT 13*  --   --   ALKPHOS 294*  --   --   BILITOT 0.8  --   --   < > = values in this interval not displayed.  Cardiac Enzymes  Recent Labs Lab 03/26/15 2330  TROPONINI 0.14*    Microbiology Results  Results for orders placed or performed in visit on 09/19/11  MRSA PCR Screening     Status: None   Collection Time: 09/19/11 11:25 AM  Result Value Ref Range Status   Micro Text Report   Final       SOURCE: nasal    COMMENT                   NEGATIVE - MRSA target DNA not detected   ANTIBIOTIC                                                        RADIOLOGY:  No results found.  EKG:   Orders placed or performed during the hospital encounter of 03/26/15  . ED EKG  . ED EKG  . EKG 12-Lead  . EKG 12-Lead   . EKG 12-Lead  . EKG 12-Lead  . EKG 12-Lead  . EKG 12-Lead  Management plans discussed with the patient, family and they are in agreement.  CODE STATUS:     Code Status Orders        Start     Ordered   03/26/15 1605  Full code   Continuous     03/26/15 1605      TOTAL TIME TAKING CARE OF THIS PATIENT: 40 minutes.    Theodoro Grist M.D on 04/01/2015 at 4:57 PM  Between 7am to 6pm - Pager - [phone]  After 6pm go to [email or web address] - password [email or web address] [facility]  Office  [phone]  CC: Primary care physician; Murlean Iba, MD

## 2015-04-01 NOTE — Care Management Important Message (Signed)
Important Message  Patient Details  Name: Velissa Revelle MRN: [id number] Date of Birth: 09-Aug-1945   Medicare Important Message Given:  Yes    Shelbie Ammons, RN 04/01/2015, 10:17 AM

## 2015-04-01 NOTE — Plan of Care (Signed)
Problem: Safety: Goal: Ability to remain free from injury will improve Outcome: Progressing Pt remains free from falls. Bed alarm in place and pt reminded to use call light.

## 2015-04-01 NOTE — Progress Notes (Signed)
MD making rounds. Discharge orders received. Appointments Scheduled. Telemetry Removed. IV removed. Prescriptions E-Prescribed to Pharmacy. Discharge paperwork provided, explained, signed and witnessed. Education handouts provided to patient. No unanswered questions. Escorted via wheelchair by [facility] representative. All belongings sent with patient and family.

## 2015-04-01 NOTE — Progress Notes (Signed)
Physical Therapy Treatment Patient Details Name: Toni Carlson MRN: [id number] DOB: 1945-12-15 Today's Date: 04/01/2015    History of Present Illness This patient is a 69 year old female who came to [facility] with facial droop. 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    PT Comments    Pt with more energy this morning but still complaining of fatigue and wishing to return home. She is making progress in her endurance, as well as increases in her functional strength as evident by her stronger, smoother sit-to-stands. All mobility continues to be limited by pain in L knee. An ortho consult may be appropriate for management of L knee pain. Due to her above deficits she will continue to benefit from skilled PT in order to return to optimal PLOF. Pt recognizes and verbalizes the importance of her continuing to move and is happy to participate in therapy tasks.   Follow Up Recommendations  Home health PT;Supervision for mobility/OOB     Equipment Recommendations  Rolling walker with 5" wheels    Recommendations for Other Services       Precautions / Restrictions Precautions Precautions: Fall Restrictions Weight Bearing Restrictions: No    Mobility  Bed Mobility Overal bed mobility: Needs Assistance Bed Mobility: Supine to Sit     Supine to sit: Modified independent (Device/Increase time)     General bed mobility comments: Pt able to perform bed mobility without assistance and with good use of her hands on the side rails   Transfers Overall transfer level: Needs assistance Equipment used: Rolling walker (2 wheeled) Transfers: Sit to/from Stand Sit to Stand: Min guard         General transfer comment: Pt able to perform stands with cues for hand placement and increased time. Pt is stable with transfers. Needs cues to control her decent when sitting  Ambulation/Gait Ambulation/Gait assistance: Min guard Ambulation Distance (Feet): 120 Feet Assistive device:  Rolling walker (2 wheeled) Gait Pattern/deviations: Step-through pattern;Antalgic;Decreased step length - right;Decreased step length - left;Decreased stride length Gait velocity: decreased Gait velocity interpretation: Below normal speed for age/gender General Gait Details: Pt ambulating fairly well but with antalgic gait on the L side which she attributes to arthritis. Although painful, pt is stable with no LOB    Stairs            Wheelchair Mobility    Modified Rankin (Stroke Patients Only)       Balance Overall balance assessment: History of Falls                                  Cognition Arousal/Alertness: Awake/alert Behavior During Therapy: WFL for tasks assessed/performed Overall Cognitive Status: Within Functional Limits for tasks assessed                      Exercises Other Exercises Other Exercises: Pt performed bilateral therex x 15 reps at supervision for proper technique. Exercises completed: hip marching, LAQ, SLR, hip abd. Pt slightly limited in AROM secondary to pain in R knee. Pt also able to practice 5 sit-to-stands with increasing strength, however she requires rest breaks inbetween each one. CGA for sit-to-stands    General Comments        Pertinent Vitals/Pain Pain Assessment: 0-10 Pain Score: 3  Pain Location: R leg Pain Descriptors / Indicators: Constant Pain Intervention(s): Limited activity within patient's tolerance;Monitored during session    Home Living  Prior Function            PT Goals (current goals can now be found in the care plan section) Acute Rehab PT Goals Patient Stated Goal: to go home PT Goal Formulation: With patient Time For Goal Achievement: 04/11/15 Potential to Achieve Goals: Good Progress towards PT goals: Progressing toward goals    Frequency  7X/week    PT Plan Current plan remains appropriate    Co-evaluation             End of Session  Equipment Utilized During Treatment: Gait belt Activity Tolerance: Patient tolerated treatment well;Patient limited by pain Patient left: in bed;with call bell/phone within reach;with bed alarm set (Pt wishing to sleep)     Time: [id number] PT Time Calculation (min) (ACUTE ONLY): 24 min  Charges:                       G CodesJanyth Contes 14-Apr-2015, 11:49 AM  Janyth Contes, SPT. [phone]

## 2015-04-03 MED ORDER — ENSURE COMPLETE PO LIQD: 237.0000 mL | Freq: Three times a day (TID) | ORAL | Status: DC

## 2015-04-03 MED ORDER — WARFARIN SODIUM 7.5 MG PO TABS
7.5000 mg | ORAL_TABLET | Freq: Every day | ORAL | Status: DC
Start: 2015-04-03 — End: 2015-04-03

## 2015-04-03 MED ORDER — WARFARIN SODIUM 7.5 MG PO TABS: 7.5000 mg | ORAL_TABLET | Freq: Every day | ORAL | Status: DC

## 2015-04-03 MED ORDER — ATORVASTATIN CALCIUM 40 MG PO TABS
40.0000 mg | ORAL_TABLET | Freq: Every day | ORAL | Status: DC
Start: 2015-04-03 — End: 2015-04-03

## 2015-04-03 MED ORDER — CARVEDILOL 6.25 MG PO TABS: 6.2500 mg | ORAL_TABLET | Freq: Two times a day (BID) | ORAL | Status: DC

## 2015-04-03 MED ORDER — TROLAMINE SALICYLATE 10 % EX CREA: TOPICAL_CREAM | Freq: Two times a day (BID) | CUTANEOUS | Status: DC | PRN

## 2015-04-03 MED ORDER — ATORVASTATIN CALCIUM 40 MG PO TABS: 40.0000 mg | ORAL_TABLET | Freq: Every day | ORAL | Status: DC

## 2015-04-03 MED ORDER — TROLAMINE SALICYLATE 10 % EX CREA
TOPICAL_CREAM | Freq: Two times a day (BID) | CUTANEOUS | Status: DC | PRN
Start: 2015-04-03 — End: 2015-04-03

## 2015-04-03 MED ORDER — POLYETHYLENE GLYCOL 3350 17 G PO PACK: 17.0000 g | PACK | Freq: Every day | ORAL | Status: DC | PRN

## 2015-04-06 ENCOUNTER — Encounter: Payer: Self-pay | Admitting: Physician Assistant

## 2015-04-06 ENCOUNTER — Encounter: Payer: Medicare Other | Admitting: Physician Assistant

## 2015-04-06 ENCOUNTER — Encounter: Payer: Self-pay | Admitting: *Deleted

## 2015-04-06 DIAGNOSIS — R0989 Other specified symptoms and signs involving the circulatory and respiratory systems: Secondary | ICD-10-CM

## 2015-04-12 ENCOUNTER — Inpatient Hospital Stay: Payer: Medicare Other

## 2015-04-12 ENCOUNTER — Emergency Department: Payer: Medicare Other

## 2015-04-12 ENCOUNTER — Encounter: Payer: Self-pay | Admitting: Emergency Medicine

## 2015-04-12 ENCOUNTER — Inpatient Hospital Stay
Admission: EM | Admit: 2015-04-12 | Discharge: 2015-04-15 | DRG: 813 | Disposition: A | Discharge status: Home / Self Care | Payer: Medicare Other | Attending: Internal Medicine | Admitting: Internal Medicine

## 2015-04-12 DIAGNOSIS — D6832 Hemorrhagic disorder due to extrinsic circulating anticoagulants: Secondary | ICD-10-CM | POA: Diagnosis present

## 2015-04-12 DIAGNOSIS — Z8249 Family history of ischemic heart disease and other diseases of the circulatory system: Secondary | ICD-10-CM

## 2015-04-12 DIAGNOSIS — I5032 Chronic diastolic (congestive) heart failure: Secondary | ICD-10-CM | POA: Diagnosis present

## 2015-04-12 DIAGNOSIS — N186 End stage renal disease: Secondary | ICD-10-CM | POA: Diagnosis present

## 2015-04-12 DIAGNOSIS — Z7901 Long term (current) use of anticoagulants: Secondary | ICD-10-CM | POA: Diagnosis not present

## 2015-04-12 DIAGNOSIS — K254 Chronic or unspecified gastric ulcer with hemorrhage: Secondary | ICD-10-CM | POA: Diagnosis present

## 2015-04-12 DIAGNOSIS — I69354 Hemiplegia and hemiparesis following cerebral infarction affecting left non-dominant side: Secondary | ICD-10-CM | POA: Diagnosis not present

## 2015-04-12 DIAGNOSIS — I132 Hypertensive heart and chronic kidney disease with heart failure and with stage 5 chronic kidney disease, or end stage renal disease: Secondary | ICD-10-CM | POA: Diagnosis present

## 2015-04-12 DIAGNOSIS — K259 Gastric ulcer, unspecified as acute or chronic, without hemorrhage or perforation: Secondary | ICD-10-CM | POA: Insufficient documentation

## 2015-04-12 DIAGNOSIS — D62 Acute posthemorrhagic anemia: Secondary | ICD-10-CM | POA: Diagnosis present

## 2015-04-12 DIAGNOSIS — Z66 Do not resuscitate: Secondary | ICD-10-CM | POA: Diagnosis present

## 2015-04-12 DIAGNOSIS — D631 Anemia in chronic kidney disease: Secondary | ICD-10-CM | POA: Diagnosis present

## 2015-04-12 DIAGNOSIS — I482 Chronic atrial fibrillation: Secondary | ICD-10-CM | POA: Diagnosis present

## 2015-04-12 DIAGNOSIS — N2581 Secondary hyperparathyroidism of renal origin: Secondary | ICD-10-CM | POA: Diagnosis present

## 2015-04-12 DIAGNOSIS — I6529 Occlusion and stenosis of unspecified carotid artery: Secondary | ICD-10-CM | POA: Diagnosis present

## 2015-04-12 DIAGNOSIS — Z87891 Personal history of nicotine dependence: Secondary | ICD-10-CM

## 2015-04-12 DIAGNOSIS — T148XXA Other injury of unspecified body region, initial encounter: Secondary | ICD-10-CM

## 2015-04-12 DIAGNOSIS — I272 Other secondary pulmonary hypertension: Secondary | ICD-10-CM | POA: Diagnosis present

## 2015-04-12 DIAGNOSIS — K219 Gastro-esophageal reflux disease without esophagitis: Secondary | ICD-10-CM | POA: Diagnosis present

## 2015-04-12 DIAGNOSIS — I2510 Atherosclerotic heart disease of native coronary artery without angina pectoris: Secondary | ICD-10-CM | POA: Diagnosis present

## 2015-04-12 DIAGNOSIS — K922 Gastrointestinal hemorrhage, unspecified: Secondary | ICD-10-CM | POA: Diagnosis present

## 2015-04-12 LAB — APTT: APTT: 71 s — AB (ref 24–36)

## 2015-04-12 LAB — COMPREHENSIVE METABOLIC PANEL
ALBUMIN: 3 g/dL — AB (ref 3.5–5.0)
ALK PHOS: 278 U/L — AB (ref 38–126)
ALT: 16 U/L (ref 14–54)
ANION GAP: 14 (ref 5–15)
AST: 20 U/L (ref 15–41)
BUN: 99 mg/dL — ABNORMAL HIGH (ref 6–20)
CO2: 24 mmol/L (ref 22–32)
Calcium: 9.2 mg/dL (ref 8.9–10.3)
Chloride: 102 mmol/L (ref 101–111)
Creatinine, Ser: 5.68 mg/dL — ABNORMAL HIGH (ref 0.44–1.00)
GFR calc Af Amer: 8 mL/min — ABNORMAL LOW (ref >60)
GFR calc non Af Amer: 7 mL/min — ABNORMAL LOW (ref >60)
GLUCOSE: 104 mg/dL — AB (ref 65–99)
POTASSIUM: 4.6 mmol/L (ref 3.5–5.1)
SODIUM: 140 mmol/L (ref 135–145)
Total Bilirubin: 0.3 mg/dL (ref 0.3–1.2)
Total Protein: 6.3 g/dL — ABNORMAL LOW (ref 6.5–8.1)

## 2015-04-12 LAB — CBC
HCT: 17.4 % — ABNORMAL LOW (ref 35.0–47.0)
Hemoglobin: 5.3 g/dL — ABNORMAL LOW (ref 12.0–16.0)
MCH: 28.7 pg (ref 26.0–34.0)
MCHC: 30.8 g/dL — ABNORMAL LOW (ref 32.0–36.0)
MCV: 93.3 fL (ref 80.0–100.0)
PLATELETS: 126 10*3/uL — AB (ref 150–440)
RBC: 1.86 MIL/uL — ABNORMAL LOW (ref 3.80–5.20)
RDW: 20.9 % — ABNORMAL HIGH (ref 11.5–14.5)
WBC: 7.7 10*3/uL (ref 3.6–11.0)

## 2015-04-12 LAB — TROPONIN I: Troponin I: 0.17 ng/mL — ABNORMAL HIGH (ref <0.031)

## 2015-04-12 LAB — PROTIME-INR
INR: 12.54 — AB
PROTHROMBIN TIME: 89.7 s — AB (ref 11.4–15.0)

## 2015-04-12 LAB — HEMOGLOBIN AND HEMATOCRIT, BLOOD
HCT: 14.7 % — CL (ref 35.0–47.0)
HEMOGLOBIN: 4.6 g/dL — AB (ref 12.0–16.0)

## 2015-04-12 LAB — LIPASE, BLOOD: LIPASE: 22 U/L (ref 11–51)

## 2015-04-12 LAB — PREPARE RBC (CROSSMATCH)

## 2015-04-12 LAB — ABO/RH: ABO/RH(D): A POS

## 2015-04-12 MED ORDER — ONDANSETRON HCL 4 MG/2ML IJ SOLN: 4.0000 mg | Freq: Four times a day (QID) | INTRAMUSCULAR | Status: DC | PRN

## 2015-04-12 MED ORDER — ONDANSETRON HCL 4 MG PO TABS: 4.0000 mg | ORAL_TABLET | Freq: Four times a day (QID) | ORAL | Status: DC | PRN

## 2015-04-12 MED ORDER — VITAMIN K1 10 MG/ML IJ SOLN
10.0000 mg | Freq: Once | INTRAVENOUS | Status: AC
  Administered 2015-04-12: 10 mg via INTRAVENOUS
  Filled 2015-04-12: qty 1

## 2015-04-12 MED ORDER — SODIUM CHLORIDE 0.9 % IV SOLN
INTRAVENOUS | Status: DC
  Administered 2015-04-12 – 2015-04-14 (×4): via INTRAVENOUS

## 2015-04-12 MED ORDER — CALCIUM ACETATE (PHOS BINDER) 667 MG/5ML PO SOLN
1334.0000 mg | Freq: Three times a day (TID) | ORAL | Status: DC
  Administered 2015-04-12 – 2015-04-15 (×4): 1334 mg via ORAL
  Filled 2015-04-12 (×12): qty 10

## 2015-04-12 MED ORDER — SODIUM CHLORIDE 0.9 % IV SOLN
10.0000 mL/h | Freq: Once | INTRAVENOUS | Status: AC
  Administered 2015-04-12: 10 mL/h via INTRAVENOUS

## 2015-04-12 MED ORDER — PANTOPRAZOLE SODIUM 40 MG IV SOLR
40.0000 mg | Freq: Two times a day (BID) | INTRAVENOUS | Status: DC
  Administered 2015-04-12 – 2015-04-14 (×6): 40 mg via INTRAVENOUS
  Filled 2015-04-12 (×7): qty 40

## 2015-04-12 MED ORDER — ALUM & MAG HYDROXIDE-SIMETH 200-200-20 MG/5ML PO SUSP: 30.0000 mL | Freq: Four times a day (QID) | ORAL | Status: DC | PRN

## 2015-04-12 MED ORDER — CARVEDILOL 6.25 MG PO TABS
6.2500 mg | ORAL_TABLET | Freq: Two times a day (BID) | ORAL | Status: DC
Start: 2015-04-12 — End: 2015-04-15
  Administered 2015-04-14 – 2015-04-15 (×3): 6.25 mg via ORAL
  Filled 2015-04-12 (×6): qty 1

## 2015-04-12 MED ORDER — PANTOPRAZOLE SODIUM 40 MG IV SOLR: 40.0000 mg | Freq: Two times a day (BID) | INTRAVENOUS | Status: DC

## 2015-04-12 MED ORDER — ONDANSETRON HCL 4 MG/2ML IJ SOLN
4.0000 mg | Freq: Once | INTRAMUSCULAR | Status: AC
  Administered 2015-04-12: 4 mg via INTRAVENOUS
  Filled 2015-04-12: qty 2

## 2015-04-12 MED ORDER — FUROSEMIDE 10 MG/ML IJ SOLN
40.0000 mg | Freq: Once | INTRAMUSCULAR | Status: AC
  Administered 2015-04-12: 40 mg via INTRAVENOUS
  Filled 2015-04-12: qty 4

## 2015-04-12 MED ORDER — ACETAMINOPHEN 325 MG PO TABS
650.0000 mg | ORAL_TABLET | Freq: Four times a day (QID) | ORAL | Status: DC | PRN
  Administered 2015-04-13: 650 mg via ORAL
  Filled 2015-04-12: qty 2

## 2015-04-12 MED ORDER — SODIUM CHLORIDE 0.9 % IJ SOLN
3.0000 mL | Freq: Two times a day (BID) | INTRAMUSCULAR | Status: DC
  Administered 2015-04-13 – 2015-04-14 (×3): 3 mL via INTRAVENOUS

## 2015-04-12 MED ORDER — HYDROCODONE-ACETAMINOPHEN 5-325 MG PO TABS
1.0000 | ORAL_TABLET | ORAL | Status: DC | PRN
  Administered 2015-04-13 – 2015-04-15 (×4): 2 via ORAL
  Filled 2015-04-12 (×4): qty 2

## 2015-04-12 MED ORDER — FUROSEMIDE 10 MG/ML IJ SOLN: 40.0000 mg | Freq: Once | INTRAMUSCULAR | Status: DC

## 2015-04-12 MED ORDER — POLYETHYLENE GLYCOL 3350 17 G PO PACK: 17.0000 g | PACK | Freq: Every day | ORAL | Status: DC | PRN

## 2015-04-12 MED ORDER — ATORVASTATIN CALCIUM 20 MG PO TABS
40.0000 mg | ORAL_TABLET | Freq: Every day | ORAL | Status: DC
  Administered 2015-04-12 – 2015-04-14 (×3): 40 mg via ORAL
  Filled 2015-04-12 (×3): qty 2

## 2015-04-12 MED ORDER — SENNOSIDES-DOCUSATE SODIUM 8.6-50 MG PO TABS: 1.0000 | ORAL_TABLET | Freq: Every evening | ORAL | Status: DC | PRN

## 2015-04-12 MED ORDER — ACETAMINOPHEN 650 MG RE SUPP
650.0000 mg | Freq: Four times a day (QID) | RECTAL | Status: DC | PRN
Start: 2015-04-12 — End: 2015-04-15

## 2015-04-12 NOTE — ED Provider Notes (Signed)
[facility] Emergency Department Provider Note  ____________________________________________  Time seen: On arrival  I have reviewed the triage vital signs and the nursing notes.   HISTORY  Chief Complaint Emesis    HPI Toni Carlson is a 69 y.o. female with a history of end-stage renal disease, A. fib on Coumadin who was recently in the hospital due to a CVA presents today with complaints of diffuse weakness. She has also felt nauseous and has had some vomiting which she attributes to eating greasy food yesterday. There is no blood or coffee ground emesis noted. On further questioning she does admit to some dark diarrhea. She received dialysis yesterday. She denies abdominal pain. She is not vomited in the last few hours     Past Medical History  Diagnosis Date  . Hypertension   . ESRD (end stage renal disease) ([place])     On Tue-Thur-Sat dialysis  . Chronic diastolic CHF (congestive heart failure) ([place])     a. echo 03/2015: EF 60-65%, normal wall motion, diastolic parameters were c/w restrictive physiology, indicative of decreased LV diastolic compliance and/or increased LA pressure, mild AS, mild MR, left atrium was severely dilated, RA was severely dilated, PASP was severely increased at 90 mm Hg  . Anemia   . Hyperparathyroidism, secondary renal ([place])   . Coronary artery disease, non-occlusive     a. cardiac cath 2013  . H/O ischemic right MCA stroke     a. 03/2015; b. residual left-sided facial droop and slurred speech  . A-fib (HCC)     a. on warfarin  . Pulmonary HTN ([place])     as above  . Symptomatic bradycardia     a. history of symptomatic bradycardia; b. felt to be 2/2 metabolic abnormalities & required temp wire but no PPM, resolved with HD  . Carotid artery stenosis     a. ultrasound 03/2015: RICA 0000000 stenosis, LICA less than [id number] stenosis    Patient Active Problem List   Diagnosis Date Noted  . Left-sided weakness 04/01/2015  .  Essential hypertension, malignant 04/01/2015  . Anemia of chronic disease 04/01/2015  . Elevated troponin 04/01/2015  . Pulmonary hypertension ([place]) 04/01/2015  . Atrial fibrillation ([place])   . CVA (cerebral infarction) 03/26/2015    Past Surgical History  Procedure Laterality Date  . Peripheral vascular catheterization N/A 02/23/2015    Procedure: A/V Shuntogram/Fistulagram;  Surgeon: Algernon Huxley, MD;  Location: [place] [facility];  Service: Cardiovascular;  Laterality: N/A;  . Peripheral vascular catheterization N/A 02/23/2015    Procedure: A/V Shunt Intervention;  Surgeon: Algernon Huxley, MD;  Location: [place] [facility];  Service: Cardiovascular;  Laterality: N/A;    Current Outpatient Rx  Name  Route  Sig  Dispense  Refill  . atorvastatin (LIPITOR) 40 MG tablet   Oral   Take 1 tablet (40 mg total) by mouth daily at 6 PM.   30 tablet   6   . carvedilol (COREG) 6.25 MG tablet   Oral   Take 1 tablet (6.25 mg total) by mouth 2 (two) times daily with a meal.   60 tablet   6   . feeding supplement, ENSURE COMPLETE, (ENSURE COMPLETE) LIQD   Oral   Take 237 mLs by mouth 3 (three) times daily with meals.   90 Bottle   6   . losartan (COZAAR) 100 MG tablet   Oral   Take 100 mg by mouth daily.         Marland Kitchen  NEXIUM 40 MG capsule   Oral   Take 40 mg by mouth daily at 12 noon.            Dispense as written.   Marland Kitchen PHOSLYRA 667 MG/5ML SOLN   Oral   Take 1,334 mg by mouth 3 (three) times daily with meals.            Dispense as written.   . polyethylene glycol (MIRALAX / GLYCOLAX) packet   Oral   Take 17 g by mouth daily as needed for mild constipation.   14 each   0   . trolamine salicylate (ASPERCREME) 10 % cream   Topical   Apply topically 2 (two) times daily as needed for muscle pain.   85 g   0   . warfarin (COUMADIN) 7.5 MG tablet   Oral   Take 1 tablet (7.5 mg total) by mouth daily at 6 PM.   30 tablet   6     Allergies Review of patient's  allergies indicates no known allergies.  Family History  Problem Relation Age of Onset  . CAD Father   . Dementia Mother     Social History Social History  Substance Use Topics  . Smoking status: Former Smoker -- 0.25 packs/day for 30 years    Types: Cigarettes  . Smokeless tobacco: Never Used  . Alcohol Use: No    Review of Systems  Constitutional: Negative for fever. Eyes: Negative for visual changes. ENT: Negative for sore throat Cardiovascular: Negative for chest pain. Respiratory: Negative for shortness of breath. Gastrointestinal: Positive for nausea and diarrhea Genitourinary: Negative for dysuria. Musculoskeletal: Negative for back pain. Skin: Negative for rash. Neurological: Negative for headaches or focal weakness Psychiatric: No anxiety    ____________________________________________   PHYSICAL EXAM:  VITAL SIGNS: ED Triage Vitals  Enc Vitals Group     BP 04/12/15 1012 123/61 mmHg     Pulse Rate 04/12/15 1012 61     Resp 04/12/15 1012 18     Temp 04/12/15 1012 97.6 F (36.4 C)     Temp Source 04/12/15 1012 Oral     SpO2 04/12/15 1012 85 %     Weight 04/12/15 1012 150 lb (68.04 kg)     Height 04/12/15 1012 5\' 6"  (1.676 m)     Head Cir --      Peak Flow --      Pain Score 04/12/15 1015 0     Pain Loc --      Pain Edu? --      Excl. in [place]? --      Constitutional: Alert and oriented. Well appearing and in no distress. Pleasant and interactive Eyes: Conjunctivae are normal.  ENT   Head: Normocephalic and atraumatic.   Mouth/Throat: Mucous membranes are moist. Cardiovascular: Irregular rhythm, normal rate. Normal and symmetric distal pulses are present in all extremities. No murmurs, rubs, or gallops. Respiratory: Normal respiratory effort without tachypnea nor retractions. Breath sounds are clear and equal bilaterally.  Gastrointestinal: Soft and non-tender in all quadrants. No distention. There is no CVA tenderness. Black stool, guaiac  positive rectal exam Genitourinary: deferred Musculoskeletal: . No lower extremity tenderness nor edema. Neurologic:  Normal speech and language. No gross focal neurologic deficits are appreciated. Skin:  Skin is warm, dry and intact. No rash noted. Psychiatric: Mood and affect are normal. Patient exhibits appropriate insight and judgment.  ____________________________________________    LABS (pertinent positives/negatives)  Labs Reviewed  COMPREHENSIVE METABOLIC PANEL - Abnormal; Notable  for the following:    Glucose, Bld 104 (*)    BUN 99 (*)    Creatinine, Ser 5.68 (*)    Total Protein 6.3 (*)    Albumin 3.0 (*)    Alkaline Phosphatase 278 (*)    GFR calc non Af Amer 7 (*)    GFR calc Af Amer 8 (*)    All other components within normal limits  CBC - Abnormal; Notable for the following:    RBC 1.86 (*)    Hemoglobin 5.3 (*)    HCT 17.4 (*)    MCHC 30.8 (*)    RDW 20.9 (*)    Platelets 126 (*)    All other components within normal limits  TROPONIN I  LIPASE, BLOOD  PROTIME-INR  APTT  TYPE AND SCREEN  PREPARE RBC (CROSSMATCH)    ____________________________________________   EKG  ED ECG REPORT I, Lavonia Drafts, the attending physician, personally viewed and interpreted this ECG.   Date: 04/12/2015  EKG Time: 11:07 AM  Rate: 67  Rhythm: A flutter  Axis: Normal  Intervals:See above  ST&T Change: Nonspecific   ____________________________________________    RADIOLOGY I have personally reviewed any xrays that were ordered on this patient: Chest x-ray shows mild cardiomegaly  ____________________________________________   PROCEDURES  Procedure(s) performed: none  Critical Care performed: yes  CRITICAL CARE Performed by: Lavonia Drafts   Total critical care time: 30 minutes  Critical care time was exclusive of separately billable procedures and treating other patients.  Critical care was necessary to treat or prevent imminent or  life-threatening deterioration.  Critical care was time spent personally by me on the following activities: development of treatment plan with patient and/or surrogate as well as nursing, discussions with consultants, evaluation of patient's response to treatment, examination of patient, obtaining history from patient or surrogate, ordering and performing treatments and interventions, ordering and review of laboratory studies, ordering and review of radiographic studies, pulse oximetry and re-evaluation of patient's condition.   ____________________________________________   INITIAL IMPRESSION / ASSESSMENT AND PLAN / ED COURSE  Pertinent labs & imaging results that were available during my care of the patient were reviewed by me and considered in my medical decision making (see chart for details).  Patient presents with relatively vague complaints of weakness and also some mild nausea. We'll check labs, chest x-ray, EKG and reevaluate  Hemoglobin noted to be 5.3, 10 days ago it was 9.2. On rectal exam she has black stool which is guaiac positive, given history of Coumadin we will add an INR, give PRBCs (patient consented by me) and admit to the hospital    ____________________________________________   FINAL CLINICAL IMPRESSION(S) / ED DIAGNOSES  Final diagnoses:  Acute GI bleeding     Lavonia Drafts, MD 04/12/15 1139

## 2015-04-12 NOTE — ED Notes (Signed)
Dr. Corky Downs and Dr. Benjie Karvonen notified about critical INR and PT results

## 2015-04-12 NOTE — ED Notes (Signed)
RN on floor notified that blood has not been started in ED due to not prepared prior to leaving the ED.

## 2015-04-12 NOTE — ED Provider Notes (Signed)
Notified of elevated INR of 13. Discussed with Dr. Genia Harold who will start vitamin K  Lavonia Drafts, MD 04/12/15 1154

## 2015-04-12 NOTE — ED Notes (Signed)
Pt states she had dialysis treatment yesterday and "ate too much" last night and hasn't felt good since. She states she vomited once this morning and had one occurrence of diarrhea.

## 2015-04-12 NOTE — ED Notes (Signed)
Nausea and vomiting since this am, denies abdominal pain, history of CVA 2 weeks ago with L sided weakness

## 2015-04-12 NOTE — H&P (Addendum)
[facility] [place] NAME: Toni Carlson    MR#:  [id number]  DATE OF BIRTH:  [birth year]  DATE OF ADMISSION:  04/12/2015  PRIMARY CARE PHYSICIAN: Murlean Iba, MD   REQUESTING/REFERRING PHYSICIAN: Dr Corky Downs  CHIEF COMPLAINT:  Dark color stools  HISTORY OF PRESENT ILLNESS:  Toni Carlson  is a 69 y.o. female with a known history of recent embolic stroke with left-sided weakness, end-stage renal disease on hemodialysis, and essential hypertension who presents above complaint. Patient was discharged on November 16 with an acute stroke, which was thought to be embolic in nature due to atrial fibrillation and was discharged on Coumadin. She presents today with dark colored stools. Her hemoglobin is noted to be 5.3 at discharge, her hemoglobin was 9.2. She is consented for blood transfusion in the emergency room and received 2 units of PRBCs. She is guiac positive.  She also states she fell a few days ago and hit the left side of her chest wall. She has no brusing noted on physical examination.  I was able to speak with GI physician on-call, Dr. Roselyn Reef who recommended NG tube to lavage. It is also noted. Her INR is greater than 13. She will receive vitamin K. PAST MEDICAL HISTORY:   Past Medical History  Diagnosis Date  . Hypertension   . ESRD (end stage renal disease) ([place])     On Tue-Thur-Sat dialysis  . Chronic diastolic CHF (congestive heart failure) ([place])     a. echo 03/2015: EF 60-65%, normal wall motion, diastolic parameters were c/w restrictive physiology, indicative of decreased LV diastolic compliance and/or increased LA pressure, mild AS, mild MR, left atrium was severely dilated, RA was severely dilated, PASP was severely increased at 90 mm Hg  . Anemia   . Hyperparathyroidism, secondary renal ([place])   . Coronary artery disease, non-occlusive     a. cardiac cath 2013  . H/O ischemic right MCA stroke     a. 03/2015; b. residual  left-sided facial droop and slurred speech  . A-fib ([facility])     a. on warfarin  . Pulmonary HTN ([place])     as above  . Symptomatic bradycardia     a. history of symptomatic bradycardia; b. felt to be 2/2 metabolic abnormalities & required temp wire but no PPM, resolved with HD  . Carotid artery stenosis     a. ultrasound 03/2015: RICA 0000000 stenosis, LICA less than [id number] stenosis    PAST SURGICAL HISTORY:   Past Surgical History  Procedure Laterality Date  . Peripheral vascular catheterization N/A 02/23/2015    Procedure: A/V Shuntogram/Fistulagram;  Surgeon: Algernon Huxley, MD;  Location: [facility];  Service: Cardiovascular;  Laterality: N/A;  . Peripheral vascular catheterization N/A 02/23/2015    Procedure: A/V Shunt Intervention;  Surgeon: Algernon Huxley, MD;  Location: [facility];  Service: Cardiovascular;  Laterality: N/A;    SOCIAL HISTORY:   Social History  Substance Use Topics  . Smoking status: Former Smoker -- 0.25 packs/day for 30 years    Types: Cigarettes  . Smokeless tobacco: Never Used  . Alcohol Use: No    FAMILY HISTORY:   Family History  Problem Relation Age of Onset  . CAD Father   . Dementia Mother     DRUG ALLERGIES:  No Known Allergies   REVIEW OF SYSTEMS:  CONSTITUTIONAL: No fever, fatigue or weakness.  EYES: No blurred or double vision.  EARS, NOSE, AND THROAT:  No tinnitus or ear pain.  RESPIRATORY: No cough, shortness of breath, wheezing or hemoptysis.  CARDIOVASCULAR: No chest pain, orthopnea, edema.  GASTROINTESTINAL: No nausea, vomiting, diarrhea  She has some rib pain no brusing positive for dark colored stools GENITOURINARY: No dysuria, hematuria.  ENDOCRINE: No polyuria, nocturia,  HEMATOLOGY: ++ anemia, easy bruising . Positive GI bleeding SKIN: No rash or lesion. MUSCULOSKELETAL: Positive arthritis NEUROLOGIC: No tingling, numbness, weakness.  PSYCHIATRY: No anxiety or depression.   MEDICATIONS AT HOME:   Prior  to Admission medications   Medication Sig Start Date End Date Taking? Authorizing Provider  atorvastatin (LIPITOR) 40 MG tablet Take 1 tablet (40 mg total) by mouth daily at 6 PM. 04/03/15   Theodoro Grist, MD  carvedilol (COREG) 6.25 MG tablet Take 1 tablet (6.25 mg total) by mouth 2 (two) times daily with a meal. 04/03/15   Theodoro Grist, MD  feeding supplement, ENSURE COMPLETE, (ENSURE COMPLETE) LIQD Take 237 mLs by mouth 3 (three) times daily with meals. 04/03/15   Theodoro Grist, MD  losartan (COZAAR) 100 MG tablet Take 100 mg by mouth daily.    Historical Provider, MD  NEXIUM 40 MG capsule Take 40 mg by mouth daily at 12 noon.  09/11/14   Historical Provider, MD  PHOSLYRA 667 MG/5ML SOLN Take 1,334 mg by mouth 3 (three) times daily with meals.  03/26/15   Historical Provider, MD  polyethylene glycol (MIRALAX / GLYCOLAX) packet Take 17 g by mouth daily as needed for mild constipation. 04/03/15   Max Sane, MD  trolamine salicylate (ASPERCREME) 10 % cream Apply topically 2 (two) times daily as needed for muscle pain. 04/03/15   Theodoro Grist, MD  warfarin (COUMADIN) 7.5 MG tablet Take 1 tablet (7.5 mg total) by mouth daily at 6 PM. 04/03/15   Theodoro Grist, MD      VITAL SIGNS:  Blood pressure 115/42, pulse 63, temperature 97.6 F (36.4 C), temperature source Oral, resp. rate 14, height 5\' 6"  (1.676 m), weight 68.04 kg (150 lb), SpO2 100 %.  PHYSICAL EXAMINATION:  GENERAL:  69 y.o.-year-old patient lying in the bed with no acute distress.  EYES: Pupils equal, round, reactive to light and accommodation. No scleral icterus. Extraocular muscles intact.  HEENT: Head atraumatic, normocephalic. Oropharynx and nasopharynx clear.  NECK:  Supple, no jugular venous distention. No thyroid enlargement, no tenderness.  LUNGS: Normal breath sounds bilaterally, no wheezing, rales,rhonchi or crepitation. No use of accessory muscles of respiration.  CARDIOVASCULAR: S1, S2 normal. No murmurs, rubs, or  gallops.  ABDOMEN: Soft, nontender, nondistended. Bowel sounds present. No organomegaly or mass.  EXTREMITIES: No pedal edema, cyanosis, or clubbing.  NEUROLOGIC: Left facial droop which is all she has some residual weakness from her previous stroke 2 weeks ago On the left side PSYCHIATRIC: The patient is alert and oriented x 3.  SKIN: No obvious rash, lesion, or ulcer.   LABORATORY PANEL:   CBC  Recent Labs Lab 04/12/15 1019  WBC 7.7  HGB 5.3*  HCT 17.4*  PLT 126*   ------------------------------------------------------------------------------------------------------------------  Chemistries   Recent Labs Lab 04/12/15 1019  NA 140  K 4.6  CL 102  CO2 24  GLUCOSE 104*  BUN 99*  CREATININE 5.68*  CALCIUM 9.2  AST 20  ALT 16  ALKPHOS 278*  BILITOT 0.3   ------------------------------------------------------------------------------------------------------------------  Cardiac Enzymes No results for input(s): TROPONINI in the last 168 hours. ------------------------------------------------------------------------------------------------------------------  RADIOLOGY:  Dg Chest Port 1 View  04/12/2015  CLINICAL DATA:  Vomiting and  diarrhea since last night after dialysis. Left-sided weakness 2 weeks. EXAM: PORTABLE CHEST 1 VIEW COMPARISON:  11/26/2013 FINDINGS: Lungs are adequately inflated with hazy prominence of the perihilar markings suggesting mild vascular congestion. There is moderate stable cardiomegaly. No significant effusion. Moderate calcified plaque over the thoracoabdominal aorta. Remainder of the exam is unchanged. IMPRESSION: Moderate stable cardiomegaly with evidence of mild vascular congestion. Electronically Signed   By: Marin Olp M.D.   On: 04/12/2015 11:26    EKG:   Atrial flutter with a heart rate is 67, no ST elevations  IMPRESSION AND PLAN:    69 year old female with recent embolic stroke discharged from our hospital on November 16 2  presents with dark colored stools, acute on chronic anemia and elevated INR.  1. GI bleed: Patient presents with dark colored stools, acute on chronic anemia with acute blood loss and an elevated INR. The likely reason is Coumadin. Coumadin is obviously been discontinued. I have spoken with GI who recommends NG tube to lavage. She will require EGD at some point. Patient is consented for 2 units of PRBCs. Continue to monitor hemoglobin every 4 hours. Continue Protonix 40 IV twice a day.  2. Acute blood loss anemia: Secondary to supratherapeutic INR. Blood transfusion as mentioned above. Continue monitor hemoglobin.  3. End-stage renal disease on hemodialysis: Nephrology has been consultative. Patient has dialysis Tuesday, Thursday and Saturday , but may require dialysis tomorrow since she is receiving 2 units of blood today.   4. Recent embolic stroke: Due to problem #1. Coumadin has been stopped. Continue neurochecks every 4 hours and consult neurology.  5. Atrial fibrillation: Coumadin needs to be stopped due to problem #1. Cardiology will be consulted. Continue Coreg for heart rate control.  6. Essential hypertension: I will continue Coreg but hold losartan for now. Continue monitor blood pressure. All the records are reviewed and case discussed with ED provider. Management plans discussed with the patient and she is in agreement.  CODE STATUS: DNR  CRITICALTOTAL TIME TAKING CARE OF THIS PATIENT: 50 minutes.  HIGH risk cardiac/neurological event  Joshuwa Vecchio M.D on 04/12/2015 at 11:46 AM  Between 7am to 6pm - Pager - [phone] After 6pm go to [email or web address] - password [email or web address] [facility]  Office  [phone]  CC: Primary care physician; Murlean Iba, MD

## 2015-04-12 NOTE — Progress Notes (Signed)
CVA , LEFT SIDE WEAKNESS

## 2015-04-12 NOTE — ED Notes (Signed)
Patient's PT/PTT/INR is too elevated to safely insert NG tube. Hold NG Tube at this time Per Benjie Karvonen MD

## 2015-04-13 DIAGNOSIS — K922 Gastrointestinal hemorrhage, unspecified: Secondary | ICD-10-CM | POA: Insufficient documentation

## 2015-04-13 LAB — BASIC METABOLIC PANEL
Anion gap: 11 (ref 5–15)
BUN: 111 mg/dL — ABNORMAL HIGH (ref 6–20)
CALCIUM: 8.5 mg/dL — AB (ref 8.9–10.3)
CO2: 24 mmol/L (ref 22–32)
CREATININE: 6.28 mg/dL — AB (ref 0.44–1.00)
Chloride: 104 mmol/L (ref 101–111)
GFR calc non Af Amer: 6 mL/min — ABNORMAL LOW (ref >60)
GFR, EST AFRICAN AMERICAN: 7 mL/min — AB (ref >60)
Glucose, Bld: 92 mg/dL (ref 65–99)
Potassium: 5.4 mmol/L — ABNORMAL HIGH (ref 3.5–5.1)
SODIUM: 139 mmol/L (ref 135–145)

## 2015-04-13 LAB — PROTIME-INR
INR: 1.5
Prothrombin Time: 18.2 seconds — ABNORMAL HIGH (ref 11.4–15.0)

## 2015-04-13 LAB — CBC
HCT: 20.9 % — ABNORMAL LOW (ref 35.0–47.0)
Hemoglobin: 6.8 g/dL — ABNORMAL LOW (ref 12.0–16.0)
MCH: 28.9 pg (ref 26.0–34.0)
MCHC: 32.3 g/dL (ref 32.0–36.0)
MCV: 89.4 fL (ref 80.0–100.0)
Platelets: 108 10*3/uL — ABNORMAL LOW (ref 150–440)
RBC: 2.34 MIL/uL — AB (ref 3.80–5.20)
RDW: 18.7 % — ABNORMAL HIGH (ref 11.5–14.5)
WBC: 8.1 10*3/uL (ref 3.6–11.0)

## 2015-04-13 LAB — PHOSPHORUS: Phosphorus: 5.6 mg/dL — ABNORMAL HIGH (ref 2.5–4.6)

## 2015-04-13 MED ORDER — LIDOCAINE-PRILOCAINE 2.5-2.5 % EX CREA
1.0000 "application " | TOPICAL_CREAM | CUTANEOUS | Status: DC | PRN
  Filled 2015-04-13: qty 5

## 2015-04-13 MED ORDER — PENTAFLUOROPROP-TETRAFLUOROETH EX AERO
1.0000 "application " | INHALATION_SPRAY | CUTANEOUS | Status: DC | PRN
  Filled 2015-04-13: qty 30

## 2015-04-13 MED ORDER — SODIUM CHLORIDE 0.9 % IV SOLN
100.0000 mL | INTRAVENOUS | Status: DC | PRN
Start: 2015-04-13 — End: 2015-04-15

## 2015-04-13 MED ORDER — ALTEPLASE 2 MG IJ SOLR
2.0000 mg | Freq: Once | INTRAMUSCULAR | Status: DC | PRN
  Filled 2015-04-13: qty 2

## 2015-04-13 MED ORDER — SODIUM CHLORIDE 0.9 % IV SOLN: 100.0000 mL | INTRAVENOUS | Status: DC | PRN

## 2015-04-13 MED ORDER — HEPARIN SODIUM (PORCINE) 1000 UNIT/ML DIALYSIS
1000.0000 [IU] | INTRAMUSCULAR | Status: DC | PRN
  Filled 2015-04-13: qty 1

## 2015-04-13 MED ORDER — LIDOCAINE HCL (PF) 1 % IJ SOLN
5.0000 mL | INTRAMUSCULAR | Status: DC | PRN
  Filled 2015-04-13: qty 5

## 2015-04-13 NOTE — Progress Notes (Signed)
Hold this dose of carvedilol per Dr. Fritzi Mandes, for pulse of 54.

## 2015-04-13 NOTE — Progress Notes (Signed)
This note also relates to the following rows which could not be included: Pulse Rate - Cannot attach notes to unvalidated device data   Post hd tx

## 2015-04-13 NOTE — Progress Notes (Signed)
HD tx complete 

## 2015-04-13 NOTE — Progress Notes (Signed)
Pre-hd tx 

## 2015-04-13 NOTE — Progress Notes (Addendum)
[facility] NAME: Toni Carlson    MR#:  [id number]  DATE OF BIRTH:  Nov 04, 1945  SUBJECTIVE:   Very hungry and came in with melanotic stools. INR 125-->1.5 No acitve bleeding REVIEW OF SYSTEMS:   Review of Systems  Constitutional: Negative for fever, chills and weight loss.  HENT: Negative for ear discharge, ear pain and nosebleeds.   Eyes: Negative for blurred vision, pain and discharge.  Respiratory: Negative for sputum production, shortness of breath, wheezing and stridor.   Cardiovascular: Negative for chest pain, palpitations, orthopnea and PND.  Gastrointestinal: Positive for melena. Negative for nausea, vomiting, abdominal pain and diarrhea.  Genitourinary: Negative for urgency and frequency.  Musculoskeletal: Negative for back pain and joint pain.  Neurological: Positive for weakness. Negative for sensory change, speech change and focal weakness.  Psychiatric/Behavioral: Negative for depression and hallucinations. The patient is not nervous/anxious.   All other systems reviewed and are negative.  Tolerating Diet:cld Tolerating PT: pending  DRUG ALLERGIES:  No Known Allergies  VITALS:  Blood pressure 120/69, pulse 55, temperature 97.8 F (36.6 C), temperature source Oral, resp. rate 15, height 5\' 6"  (1.676 m), weight 146 lb 9.7 oz (66.5 kg), SpO2 100 %.  PHYSICAL EXAMINATION:   Physical Exam  GENERAL:  69 y.o.-year-old patient lying in the bed with no acute distress. Pallor+ EYES: Pupils equal, round, reactive to light and accommodation. No scleral icterus. Extraocular muscles intact.  HEENT: Head atraumatic, normocephalic. Oropharynx and nasopharynx clear.  NECK:  Supple, no jugular venous distention. No thyroid enlargement, no tenderness.  LUNGS: Normal breath sounds bilaterally, no wheezing, rales, rhonchi. No use of accessory muscles of respiration.  CARDIOVASCULAR: S1, S2 normal. No murmurs, rubs, or  gallops.  ABDOMEN: Soft, nontender, nondistended. Bowel sounds present. No organomegaly or mass.  EXTREMITIES: No cyanosis, clubbing or edema b/l.    NEUROLOGIC: facial drool left, neuro deficits chronic PSYCHIATRIC: patient is alert and oriented x 3.  SKIN: No obvious rash, lesion, or ulcer.    LABORATORY PANEL:   CBC  Recent Labs Lab 04/13/15 0028  WBC 8.1  HGB 6.8*  HCT 20.9*  PLT 108*    Chemistries   Recent Labs Lab 04/12/15 1019 04/13/15 0028  NA 140 139  K 4.6 5.4*  CL 102 104  CO2 24 24  GLUCOSE 104* 92  BUN 99* 111*  CREATININE 5.68* 6.28*  CALCIUM 9.2 8.5*  AST 20  --   ALT 16  --   ALKPHOS 278*  --   BILITOT 0.3  --     Cardiac Enzymes  Recent Labs Lab 04/12/15 1019  TROPONINI 0.17*    RADIOLOGY:  Ct Abdomen Pelvis Wo Contrast  04/12/2015  CLINICAL DATA:  Gastrointestinal hemorrhage. Anemia. Significant drop in hemoglobin. Supratherapeutic INR. EXAM: CT ABDOMEN AND PELVIS WITHOUT CONTRAST TECHNIQUE: Multidetector CT imaging of the abdomen and pelvis was performed following the standard protocol without IV contrast. COMPARISON:  None. FINDINGS: Lower chest: Small bilateral pleural effusions and dependent atelectasis are present. A small pericardial effusion is present. The heart is enlarged. Coronary artery calcifications present. Hepatobiliary: No focal hepatic lesions are present. The gallbladder and common bile duct are within normal limits. Arterial calcifications are present. Pancreas: Within normal limits. Spleen: Scattered calcifications suggest prior granulomatous disease. Adrenals/Urinary Tract: The adrenal glands are within normal limits bilaterally. The kidneys are markedly atrophic and calcified. The ureters are unremarkable. The urinary bladder is mostly collapsed. Stomach/Bowel: Diverticular changes are present  in the sigmoid colon without focal inflammation to suggest diverticulitis. The more proximal colon is unremarkable. The appendix is  visualized and normal. The stomach and duodenum are unremarkable. Small bowel is within normal limits. Vascular/Lymphatic: Extensive vascular calcifications are present. There is no aneurysm. Sub cm para-aortic and mesenteric lymph nodes are present. Reproductive: Hysterectomy is noted. The adnexa are within normal limits for age. Other: Diffuse mesenteric edema is present. Subcutaneous edema is present as well. There is layering low-density, nonhemorrhagic, fluid within the anatomic pelvis. A ventral abdominal hernia measures 20 mm. There is high density fat which may suggest ischemia of the fat. There is no associated bowel. Musculoskeletal: Marrow changes of end-stage renal disease are noted. Vertebral body heights and alignment are maintained. IMPRESSION: 1. Diffuse mesenteric edema and layering fluid within the anatomic pelvis. 2. No focal intraperitoneal or retroperitoneal hemorrhage. 3. No focal colonic lesion. 4. Extensive atherosclerotic disease. 5. Chronic bilateral renal atrophy. 6. Small bilateral pleural effusions and pericardial effusion. 7. Cardiomegaly without failure. Electronically Signed   By: San Morelle M.D.   On: 04/12/2015 19:11   Dg Chest Port 1 View  04/12/2015  CLINICAL DATA:  Vomiting and diarrhea since last night after dialysis. Left-sided weakness 2 weeks. EXAM: PORTABLE CHEST 1 VIEW COMPARISON:  11/26/2013 FINDINGS: Lungs are adequately inflated with hazy prominence of the perihilar markings suggesting mild vascular congestion. There is moderate stable cardiomegaly. No significant effusion. Moderate calcified plaque over the thoracoabdominal aorta. Remainder of the exam is unchanged. IMPRESSION: Moderate stable cardiomegaly with evidence of mild vascular congestion. Electronically Signed   By: Marin Olp M.D.   On: 04/12/2015 11:26   ASSESSMENT AND PLAN:   69 year old female with recent embolic stroke discharged from our hospital on November 16 2 presents with dark  colored stools, acute on chronic anemia and elevated INR.  1. GI bleed: Patient presents with dark colored stools, acute on chronic anemia with acute blood loss and an elevated INR. The likely reason is Coumadin. Coumadin is  been discontinued. - She will require EGD at some point.  -s/p 2 units of PRBCs transfusion.  -Continue to monitor hemoglobin every 12 hours. - Continue Protonix 40 IV twice a day. -5.3--->4.6--->2 units PRBC-->6.8 -spoke with dr Allen Norris. Ok to have CLD today  2. Acute blood loss anemia: Secondary to supratherapeutic INR. Blood transfusion as mentioned above. Continue monitor hemoglobin. -INR 12.3--->vi K --->1.5  3. End-stage renal disease on hemodialysis: Nephrology has been consultative. Patient has dialysis Tuesday, Thursday and Saturday -elevated K. At HD today  4. Recent embolic stroke: Due to problem #1. Coumadin has been stopped. Continue neurochecks every 4 hours  5. Atrial fibrillation: Coumadin needs to be stopped due to problem #1.  Cardiology input noted - Continue Coreg for heart rate control.  6. Essential hypertension: -will continue Coreg and losartan  Case discussed with Care Management/Social Worker. Management plans discussed with the patient, family and they are in agreement.  CODE STATUS: full  DVT Prophylaxis: SCD  TOTAL TIME TAKING CARE OF THIS PATIENT: 30 minutes.  >50% time spent on counselling and coordination of care  POSSIBLE D/C IN 2-3 DAYS, DEPENDING ON CLINICAL CONDITION.   Glender Augusta M.D on 04/13/2015 at 12:31 PM  Between 7am to 6pm - Pager - [phone]  After 6pm go to [email or web address] - password [email or web address] [facility]  Office  [phone]  CC: Primary care physician; Murlean Iba, MD

## 2015-04-13 NOTE — Progress Notes (Signed)
[facility]  Patient Status: Active (receiving services up to time of hospitalization)  [facility] is providing the following services: RN, PT and ST  If patient discharges after hours, please call [phone].   Corliss Parish 04/13/2015, 3:10 PM

## 2015-04-13 NOTE — Consult Note (Signed)
Reason for Consult: stroke Referring Physician: Dr. Carrington Clamp Sahian Kerney is an 69 y.o. female.  HPI: 69 yo RHD F presents to [facility] due to blood in stool.  Pt has hx of old R MCA infarct on 03/26/15 and has some stable L hemiparesis from this which has not changed.  Neurology was asked about anticoagulation.  Past Medical History  Diagnosis Date  . Hypertension   . ESRD (end stage renal disease) ([place])     On Tue-Thur-Sat dialysis  . Chronic diastolic CHF (congestive heart failure) ([place])     a. echo 03/2015: EF 60-65%, normal wall motion, diastolic parameters were c/w restrictive physiology, indicative of decreased LV diastolic compliance and/or increased LA pressure, mild AS, mild MR, left atrium was severely dilated, RA was severely dilated, PASP was severely increased at 90 mm Hg  . Anemia   . Hyperparathyroidism, secondary renal ([place])   . Coronary artery disease, non-occlusive     a. cardiac cath 2013  . H/O ischemic right MCA stroke     a. 03/2015; b. residual left-sided facial droop and slurred speech  . A-fib ([facility])     a. on warfarin  . Pulmonary HTN ([place])     as above  . Symptomatic bradycardia     a. history of symptomatic bradycardia; b. felt to be 2/2 metabolic abnormalities & required temp wire but no PPM, resolved with HD  . Carotid artery stenosis     a. ultrasound 03/2015: RICA 96-28% stenosis, LICA less than 36% stenosis    Past Surgical History  Procedure Laterality Date  . Peripheral vascular catheterization N/A 02/23/2015    Procedure: A/V Shuntogram/Fistulagram;  Surgeon: Algernon Huxley, MD;  Location: [facility];  Service: Cardiovascular;  Laterality: N/A;  . Peripheral vascular catheterization N/A 02/23/2015    Procedure: A/V Shunt Intervention;  Surgeon: Algernon Huxley, MD;  Location: [facility];  Service: Cardiovascular;  Laterality: N/A;    Family History  Problem Relation Age of Onset  . CAD Father   . Dementia Mother     Social  History:  reports that she has quit smoking. Her smoking use included Cigarettes. She has a 7.5 pack-year smoking history. She has never used smokeless tobacco. She reports that she does not drink alcohol or use illicit drugs.  Allergies: No Known Allergies  Medications: personally reviewed by me as per chart  Results for orders placed or performed during the hospital encounter of 04/12/15 (from the past 48 hour(s))  Comprehensive metabolic panel     Status: Abnormal   Collection Time: 04/12/15 10:19 AM  Result Value Ref Range   Sodium 140 135 - 145 mmol/L   Potassium 4.6 3.5 - 5.1 mmol/L   Chloride 102 101 - 111 mmol/L   CO2 24 22 - 32 mmol/L   Glucose, Bld 104 (H) 65 - 99 mg/dL   BUN 99 (H) 6 - 20 mg/dL   Creatinine, Ser 5.68 (H) 0.44 - 1.00 mg/dL   Calcium 9.2 8.9 - 10.3 mg/dL   Total Protein 6.3 (L) 6.5 - 8.1 g/dL   Albumin 3.0 (L) 3.5 - 5.0 g/dL   AST 20 15 - 41 U/L   ALT 16 14 - 54 U/L   Alkaline Phosphatase 278 (H) 38 - 126 U/L   Total Bilirubin 0.3 0.3 - 1.2 mg/dL   GFR calc non Af Amer 7 (L) >60 mL/min   GFR calc Af Amer 8 (L) >60 mL/min  Comment: (NOTE) The eGFR has been calculated using the CKD EPI equation. This calculation has not been validated in all clinical situations. eGFR's persistently <60 mL/min signify possible Chronic Kidney Disease.    Anion gap 14 5 - 15  CBC     Status: Abnormal   Collection Time: 04/12/15 10:19 AM  Result Value Ref Range   WBC 7.7 3.6 - 11.0 K/uL   RBC 1.86 (L) 3.80 - 5.20 MIL/uL   Hemoglobin 5.3 (L) 12.0 - 16.0 g/dL   HCT 17.4 (L) 35.0 - 47.0 %   MCV 93.3 80.0 - 100.0 fL   MCH 28.7 26.0 - 34.0 pg   MCHC 30.8 (L) 32.0 - 36.0 g/dL   RDW 20.9 (H) 11.5 - 14.5 %   Platelets 126 (L) 150 - 440 K/uL  Troponin I     Status: Abnormal   Collection Time: 04/12/15 10:19 AM  Result Value Ref Range   Troponin I 0.17 (H) <0.031 ng/mL    Comment: READ BACK AND VERIFIED WITH KIM MAIN AT 1155 04/12/15 DAS        PERSISTENTLY INCREASED  TROPONIN VALUES IN THE RANGE OF 0.04-0.49 ng/mL CAN BE SEEN IN:       -UNSTABLE ANGINA       -CONGESTIVE HEART FAILURE       -MYOCARDITIS       -CHEST TRAUMA       -ARRYHTHMIAS       -LATE PRESENTING MYOCARDIAL INFARCTION       -COPD   CLINICAL FOLLOW-UP RECOMMENDED.   Lipase, blood     Status: None   Collection Time: 04/12/15 10:19 AM  Result Value Ref Range   Lipase 22 11 - 51 U/L  Protime-INR     Status: Abnormal   Collection Time: 04/12/15 10:19 AM  Result Value Ref Range   Prothrombin Time 89.7 (H) 11.4 - 15.0 seconds   INR 12.54 (HH)     Comment: RESULT REPEATED AND VERIFIED CRITICAL RESULT CALLED TO, READ BACK BY AND VERIFIED WITH:  KIM MAIN AT 1150 04/12/15 SDR   APTT     Status: Abnormal   Collection Time: 04/12/15 10:19 AM  Result Value Ref Range   aPTT 71 (H) 24 - 36 seconds    Comment:        IF BASELINE aPTT IS ELEVATED, SUGGEST PATIENT RISK ASSESSMENT BE USED TO DETERMINE APPROPRIATE ANTICOAGULANT THERAPY.   ABO/Rh     Status: None   Collection Time: 04/12/15 11:31 AM  Result Value Ref Range   ABO/RH(D) A POS   Type and screen [place]     Status: None   Collection Time: 04/12/15 11:33 AM  Result Value Ref Range   ABO/RH(D) A POS    Antibody Screen NEG    Sample Expiration 04/15/2015    Unit Number [id number]    Blood Component Type RBC, LR IRR    Unit division 00    Status of Unit ISSUED,FINAL    Transfusion Status OK TO TRANSFUSE    Crossmatch Result Compatible    Unit Number [id number]    Blood Component Type RED CELLS,LR    Unit division 00    Status of Unit ISSUED,FINAL    Transfusion Status OK TO TRANSFUSE    Crossmatch Result Compatible   Prepare RBC     Status: None   Collection Time: 04/12/15 11:33 AM  Result Value Ref Range   Order Confirmation ORDER PROCESSED BY BLOOD BANK   Hemoglobin  and hematocrit, blood     Status: Abnormal   Collection Time: 04/12/15  1:51 PM  Result Value Ref Range    Hemoglobin 4.6 (LL) 12.0 - 16.0 g/dL    Comment: RESULT REPEATED AND VERIFIED CRITICAL RESULT CALLED TO, READ BACK BY AND VERIFIED WITH:  SHARON CHAVIS AT 1437 04/12/15 SDR    HCT 14.7 (LL) 35.0 - 38.1 %  Basic metabolic panel     Status: Abnormal   Collection Time: 04/13/15 12:28 AM  Result Value Ref Range   Sodium 139 135 - 145 mmol/L   Potassium 5.4 (H) 3.5 - 5.1 mmol/L   Chloride 104 101 - 111 mmol/L   CO2 24 22 - 32 mmol/L   Glucose, Bld 92 65 - 99 mg/dL   BUN 111 (H) 6 - 20 mg/dL    Comment: RESULTS CONFIRMED BY MANUAL DILUTION   Creatinine, Ser 6.28 (H) 0.44 - 1.00 mg/dL   Calcium 8.5 (L) 8.9 - 10.3 mg/dL   GFR calc non Af Amer 6 (L) >60 mL/min   GFR calc Af Amer 7 (L) >60 mL/min    Comment: (NOTE) The eGFR has been calculated using the CKD EPI equation. This calculation has not been validated in all clinical situations. eGFR's persistently <60 mL/min signify possible Chronic Kidney Disease.    Anion gap 11 5 - 15  CBC     Status: Abnormal   Collection Time: 04/13/15 12:28 AM  Result Value Ref Range   WBC 8.1 3.6 - 11.0 K/uL   RBC 2.34 (L) 3.80 - 5.20 MIL/uL   Hemoglobin 6.8 (L) 12.0 - 16.0 g/dL   HCT 20.9 (L) 35.0 - 47.0 %   MCV 89.4 80.0 - 100.0 fL   MCH 28.9 26.0 - 34.0 pg   MCHC 32.3 32.0 - 36.0 g/dL   RDW 18.7 (H) 11.5 - 14.5 %   Platelets 108 (L) 150 - 440 K/uL  Protime-INR     Status: Abnormal   Collection Time: 04/13/15  9:19 AM  Result Value Ref Range   Prothrombin Time 18.2 (H) 11.4 - 15.0 seconds   INR 1.50   Phosphorus     Status: Abnormal   Collection Time: 04/13/15 10:48 AM  Result Value Ref Range   Phosphorus 5.6 (H) 2.5 - 4.6 mg/dL    Ct Abdomen Pelvis Wo Contrast  04/12/2015  CLINICAL DATA:  Gastrointestinal hemorrhage. Anemia. Significant drop in hemoglobin. Supratherapeutic INR. EXAM: CT ABDOMEN AND PELVIS WITHOUT CONTRAST TECHNIQUE: Multidetector CT imaging of the abdomen and pelvis was performed following the standard protocol without IV  contrast. COMPARISON:  None. FINDINGS: Lower chest: Small bilateral pleural effusions and dependent atelectasis are present. A small pericardial effusion is present. The heart is enlarged. Coronary artery calcifications present. Hepatobiliary: No focal hepatic lesions are present. The gallbladder and common bile duct are within normal limits. Arterial calcifications are present. Pancreas: Within normal limits. Spleen: Scattered calcifications suggest prior granulomatous disease. Adrenals/Urinary Tract: The adrenal glands are within normal limits bilaterally. The kidneys are markedly atrophic and calcified. The ureters are unremarkable. The urinary bladder is mostly collapsed. Stomach/Bowel: Diverticular changes are present in the sigmoid colon without focal inflammation to suggest diverticulitis. The more proximal colon is unremarkable. The appendix is visualized and normal. The stomach and duodenum are unremarkable. Small bowel is within normal limits. Vascular/Lymphatic: Extensive vascular calcifications are present. There is no aneurysm. Sub cm para-aortic and mesenteric lymph nodes are present. Reproductive: Hysterectomy is noted. The adnexa are within normal limits for  age. Other: Diffuse mesenteric edema is present. Subcutaneous edema is present as well. There is layering low-density, nonhemorrhagic, fluid within the anatomic pelvis. A ventral abdominal hernia measures 20 mm. There is high density fat which may suggest ischemia of the fat. There is no associated bowel. Musculoskeletal: Marrow changes of end-stage renal disease are noted. Vertebral body heights and alignment are maintained. IMPRESSION: 1. Diffuse mesenteric edema and layering fluid within the anatomic pelvis. 2. No focal intraperitoneal or retroperitoneal hemorrhage. 3. No focal colonic lesion. 4. Extensive atherosclerotic disease. 5. Chronic bilateral renal atrophy. 6. Small bilateral pleural effusions and pericardial effusion. 7. Cardiomegaly  without failure. Electronically Signed   By: San Morelle M.D.   On: 04/12/2015 19:11   Dg Chest Port 1 View  04/12/2015  CLINICAL DATA:  Vomiting and diarrhea since last night after dialysis. Left-sided weakness 2 weeks. EXAM: PORTABLE CHEST 1 VIEW COMPARISON:  11/26/2013 FINDINGS: Lungs are adequately inflated with hazy prominence of the perihilar markings suggesting mild vascular congestion. There is moderate stable cardiomegaly. No significant effusion. Moderate calcified plaque over the thoracoabdominal aorta. Remainder of the exam is unchanged. IMPRESSION: Moderate stable cardiomegaly with evidence of mild vascular congestion. Electronically Signed   By: Marin Olp M.D.   On: 04/12/2015 11:26    Review of Systems  Constitutional: Positive for malaise/fatigue. Negative for fever, chills, weight loss and diaphoresis.  HENT: Negative.   Eyes: Negative.   Respiratory: Negative.   Cardiovascular: Negative.   Gastrointestinal: Positive for blood in stool and melena. Negative for heartburn, nausea, vomiting, abdominal pain, diarrhea and constipation.  Genitourinary: Negative.   Musculoskeletal: Negative.   Skin: Negative.   Neurological: Positive for weakness. Negative for dizziness, tingling, tremors, sensory change, speech change, focal weakness, seizures and loss of consciousness.  Psychiatric/Behavioral: Negative.    Blood pressure 139/47, pulse 55, temperature 98.2 F (36.8 C), temperature source Oral, resp. rate 14, height 5' 6" (1.676 m), weight 66.2 kg (145 lb 15.1 oz), SpO2 94 %. Physical Exam  Nursing note and vitals reviewed. Constitutional: She is oriented to person, place, and time. She appears well-developed and well-nourished. No distress.  HENT:  Head: Normocephalic and atraumatic.  Right Ear: External ear normal.  Left Ear: External ear normal.  Nose: Nose normal.  Mouth/Throat: Oropharynx is clear and moist.  Eyes: Conjunctivae and EOM are normal. Pupils are  equal, round, and reactive to light. No scleral icterus.  Neck: Normal range of motion. Neck supple.  Cardiovascular: Normal rate, regular rhythm, normal heart sounds and intact distal pulses.   No murmur heard. Respiratory: Effort normal and breath sounds normal.  GI: Soft. Bowel sounds are normal.  Musculoskeletal: Normal range of motion. She exhibits no edema.  Neurological: She is oriented to person, place, and time. She has normal reflexes. A cranial nerve deficit is present. She exhibits abnormal muscle tone. Coordination abnormal.  Skin: Skin is warm. She is not diaphoretic.  Psychiatric: She has a normal mood and affect.    Assessment/Plan: 1.  Stroke-  Stable -  Ok to hold anticoagulation until safe from GI standpoint and this could even be up to one month from now -  Consider NOAC if INRs still fluctate -  No neurologic reason pt can not get EGD -  Will sign off, please call with questions  Valora Corporal 04/13/2015, 4:57 PM

## 2015-04-13 NOTE — Progress Notes (Signed)
Spoke with Dr. Fritzi Mandes, per MD place order for hemoglobin to be drawn tomorrow morning.

## 2015-04-13 NOTE — Progress Notes (Signed)
Pt dialyzed today and tolerated well, will be by again tomorrow to see patient.

## 2015-04-13 NOTE — Progress Notes (Signed)
HD tx start 

## 2015-04-13 NOTE — Consult Note (Signed)
[facility]  [address], [place], [place] [address] Phone: [phone] Fax : [phone]  Consultation  Referring Provider:     No ref. provider found Primary Care Physician:  Murlean Iba, MD Primary Gastroenterologist:      Reason for Consultation:     Anemia with melena  Date of Admission:  04/12/2015 Date of Consultation:  04/13/2015         HPI:   Toni Carlson is a 69 y.o. female who was discharged recently for an embolic stroke input on Coumadin. The patient has a history of end-stage renal disease on hemodialysis and hypertension. The patient came in with black stools and a hemoglobin of 5.3. The patient was also found to have melanotic stools. The patient's INR was elevated at above 12 on admission. The patient was to have an NG tube placed prior to the INR coming back but that water was canceled when the INR was found to be elevated. The patient was given vitamin K and her hemoglobin has been stable. She also received a blood transfusion during this admission.  Past Medical History  Diagnosis Date  . Hypertension   . ESRD (end stage renal disease) ([place])     On Tue-Thur-Sat dialysis  . Chronic diastolic CHF (congestive heart failure) ([facility])     a. echo 03/2015: EF 60-65%, normal wall motion, diastolic parameters were c/w restrictive physiology, indicative of decreased LV diastolic compliance and/or increased LA pressure, mild AS, mild MR, left atrium was severely dilated, RA was severely dilated, PASP was severely increased at 90 mm Hg  . Anemia   . Hyperparathyroidism, secondary renal ([place])   . Coronary artery disease, non-occlusive     a. cardiac cath 2013  . H/O ischemic right MCA stroke     a. 03/2015; b. residual left-sided facial droop and slurred speech  . A-fib ([facility])     a. on warfarin  . Pulmonary HTN ([place])     as above  . Symptomatic bradycardia     a. history of symptomatic bradycardia; b. felt to be 2/2 metabolic abnormalities &  required temp wire but no PPM, resolved with HD  . Carotid artery stenosis     a. ultrasound 03/2015: RICA [id number] stenosis, LICA less than [id number] stenosis    Past Surgical History  Procedure Laterality Date  . Peripheral vascular catheterization N/A 02/23/2015    Procedure: A/V Shuntogram/Fistulagram;  Surgeon: Algernon Huxley, MD;  Location: [facility];  Service: Cardiovascular;  Laterality: N/A;  . Peripheral vascular catheterization N/A 02/23/2015    Procedure: A/V Shunt Intervention;  Surgeon: Algernon Huxley, MD;  Location: [facility];  Service: Cardiovascular;  Laterality: N/A;    Prior to Admission medications   Medication Sig Start Date End Date Taking? Authorizing Provider  amLODipine (NORVASC) 10 MG tablet Take 10 mg by mouth daily. 04/02/15  Yes Historical Provider, MD  atorvastatin (LIPITOR) 40 MG tablet Take 1 tablet (40 mg total) by mouth daily at 6 PM. 04/03/15  Yes Theodoro Grist, MD  carvedilol (COREG) 6.25 MG tablet Take 1 tablet (6.25 mg total) by mouth 2 (two) times daily with a meal. 04/03/15  Yes Theodoro Grist, MD  feeding supplement, ENSURE COMPLETE, (ENSURE COMPLETE) LIQD Take 237 mLs by mouth 3 (three) times daily with meals. 04/03/15  Yes Theodoro Grist, MD  hydrALAZINE (APRESOLINE) 25 MG tablet Take 25 mg by mouth 3 (three) times daily.  04/07/15  Yes Historical Provider, MD  losartan (COZAAR)  100 MG tablet Take 100 mg by mouth daily.   Yes Historical Provider, MD  NEXIUM 40 MG capsule Take 40 mg by mouth daily at 12 noon.  09/11/14  Yes Historical Provider, MD  [id number] MG/5ML SOLN Take 1,334 mg by mouth 3 (three) times daily with meals.  03/26/15  Yes Historical Provider, MD  polyethylene glycol (MIRALAX / GLYCOLAX) packet Take 17 g by mouth daily as needed for mild constipation. 04/03/15  Yes Max Sane, MD  trolamine salicylate (ASPERCREME) 10 % cream Apply topically 2 (two) times daily as needed for muscle pain. 04/03/15  Yes Theodoro Grist, MD    warfarin (COUMADIN) 7.5 MG tablet Take 1 tablet (7.5 mg total) by mouth daily at 6 PM. 04/03/15  Yes Theodoro Grist, MD    Family History  Problem Relation Age of Onset  . CAD Father   . Dementia Mother      Social History  Substance Use Topics  . Smoking status: Former Smoker -- 0.25 packs/day for 30 years    Types: Cigarettes  . Smokeless tobacco: Never Used  . Alcohol Use: No    Allergies as of 04/12/2015  . (No Known Allergies)    Review of Systems:    All systems reviewed and negative except where noted in HPI.   Physical Exam:  Vital signs in last 24 hours: Temp:  [97.5 F (36.4 C)-98.1 F (36.7 C)] 97.9 F (36.6 C) (11/28 1322) Pulse Rate:  [48-88] 78 (11/28 1322) Resp:  [15-20] 18 (11/28 1322) BP: (112-145)/(48-72) 135/57 mmHg (11/28 1322) SpO2:  [92 %-100 %] 98 % (11/28 1322) Weight:  [145 lb 15.1 oz (66.2 kg)-146 lb 9.7 oz (66.5 kg)] 145 lb 15.1 oz (66.2 kg) (11/28 1322) Last BM Date: 04/12/15 General:   Pleasant, cooperative in NAD Head:  Normocephalic and atraumatic. Eyes:   No icterus.   Conjunctiva pink. PERRLA. Ears:  Normal auditory acuity. Neck:  Supple; no masses or thyroidomegaly Lungs: Respirations even and unlabored. Lungs clear to auscultation bilaterally.   No wheezes, crackles, or rhonchi.  Heart:  Regular rate and rhythm;  Without murmur, clicks, rubs or gallops Abdomen:  Soft, nondistended, nontender. Normal bowel sounds. No appreciable masses or hepatomegaly.  No rebound or guarding.  Rectal:  Not performed. Msk:  Symmetrical without gross deformities.   Extremities:  Without edema, cyanosis or clubbing. Neurologic:  Alert and oriented x3;  left facial droop  Skin:  Intact without significant lesions or rashes. Cervical Nodes:  No significant cervical adenopathy. Psych:  Alert and cooperative. Normal affect.  LAB RESULTS:  Recent Labs  04/12/15 1019 04/12/15 1351 04/13/15 0028  WBC 7.7  --  8.1  HGB 5.3* 4.6* 6.8*  HCT 17.4*  14.7* 20.9*  PLT 126*  --  108*   BMET  Recent Labs  04/12/15 1019 04/13/15 0028  NA 140 139  K 4.6 5.4*  CL 102 104  CO2 24 24  GLUCOSE 104* 92  BUN 99* 111*  CREATININE 5.68* 6.28*  CALCIUM 9.2 8.5*   LFT  Recent Labs  04/12/15 1019  PROT 6.3*  ALBUMIN 3.0*  AST 20  ALT 16  ALKPHOS 278*  BILITOT 0.3   PT/INR  Recent Labs  04/12/15 1019 04/13/15 0919  LABPROT 89.7* 18.2*  INR 12.54* 1.50    STUDIES: Ct Abdomen Pelvis Wo Contrast  04/12/2015  CLINICAL DATA:  Gastrointestinal hemorrhage. Anemia. Significant drop in hemoglobin. Supratherapeutic INR. EXAM: CT ABDOMEN AND PELVIS WITHOUT CONTRAST TECHNIQUE: Multidetector CT imaging  of the abdomen and pelvis was performed following the standard protocol without IV contrast. COMPARISON:  None. FINDINGS: Lower chest: Small bilateral pleural effusions and dependent atelectasis are present. A small pericardial effusion is present. The heart is enlarged. Coronary artery calcifications present. Hepatobiliary: No focal hepatic lesions are present. The gallbladder and common bile duct are within normal limits. Arterial calcifications are present. Pancreas: Within normal limits. Spleen: Scattered calcifications suggest prior granulomatous disease. Adrenals/Urinary Tract: The adrenal glands are within normal limits bilaterally. The kidneys are markedly atrophic and calcified. The ureters are unremarkable. The urinary bladder is mostly collapsed. Stomach/Bowel: Diverticular changes are present in the sigmoid colon without focal inflammation to suggest diverticulitis. The more proximal colon is unremarkable. The appendix is visualized and normal. The stomach and duodenum are unremarkable. Small bowel is within normal limits. Vascular/Lymphatic: Extensive vascular calcifications are present. There is no aneurysm. Sub cm para-aortic and mesenteric lymph nodes are present. Reproductive: Hysterectomy is noted. The adnexa are within normal limits  for age. Other: Diffuse mesenteric edema is present. Subcutaneous edema is present as well. There is layering low-density, nonhemorrhagic, fluid within the anatomic pelvis. A ventral abdominal hernia measures 20 mm. There is high density fat which may suggest ischemia of the fat. There is no associated bowel. Musculoskeletal: Marrow changes of end-stage renal disease are noted. Vertebral body heights and alignment are maintained. IMPRESSION: 1. Diffuse mesenteric edema and layering fluid within the anatomic pelvis. 2. No focal intraperitoneal or retroperitoneal hemorrhage. 3. No focal colonic lesion. 4. Extensive atherosclerotic disease. 5. Chronic bilateral renal atrophy. 6. Small bilateral pleural effusions and pericardial effusion. 7. Cardiomegaly without failure. Electronically Signed   By: San Morelle M.D.   On: 04/12/2015 19:11   Dg Chest Port 1 View  04/12/2015  CLINICAL DATA:  Vomiting and diarrhea since last night after dialysis. Left-sided weakness 2 weeks. EXAM: PORTABLE CHEST 1 VIEW COMPARISON:  11/26/2013 FINDINGS: Lungs are adequately inflated with hazy prominence of the perihilar markings suggesting mild vascular congestion. There is moderate stable cardiomegaly. No significant effusion. Moderate calcified plaque over the thoracoabdominal aorta. Remainder of the exam is unchanged. IMPRESSION: Moderate stable cardiomegaly with evidence of mild vascular congestion. Electronically Signed   By: Marin Olp M.D.   On: 04/12/2015 11:26      Impression / Plan:   Toni Carlson is a 69 y.o. y/o female with who was admitted with a super therapeutic INR of 12.54 and melena with anemia. The patient will be set up for an EGD for tomorrow. The patient has been explained the plan and agrees with it.I have discussed risks & benefits which include, but are not limited to, bleeding, infection, perforation & drug reaction.  The patient agrees with this plan & written consent will be obtained.        Thank you for involving me in the care of this patient.      LOS: 1 day   Ollen Bowl, MD  04/13/2015, 1:55 PM   Note: This dictation was prepared with Dragon dictation along with smaller phrase technology. Any transcriptional errors that result from this process are unintentional.

## 2015-04-13 NOTE — Consult Note (Signed)
[facility] Consultation Note  Patient ID: Toni Carlson, MRN: [id number], DOB/AGE: 1946/05/06 69 y.o. Admit date: 04/12/2015   Date of Consult: 04/13/2015 Primary Physician: Murlean Iba, MD Primary Cardiologist: None  Chief Complaint:  Chief Complaint  Patient presents with  . Emesis   Reason for Consult: atrial fibrillation with recent embolic stroke and GI bleed  HPI: 69 y.o. 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 The patient has had reasonable heart rate control with carvedilol although has had a recent embolic phenomenon with left sided weakness in stroke. The patient was placed on Coumadin for which showed the patient has had some waxing and waning of INR. With this INR change patient is now had some significant melena causing hemoglobin 2 drop to 4.6. After packed red blood cells patient does have more elevation and a hemoglobin of 6.0. She does feel somewhat better at this time and has not had any exacerbation of diastolic dysfunction heart failure or other significant cardiovascular symptoms. We have discussed risk and benefits of anticoagulation for risk reduction in stroke as well as continued concerns of bleeding complication. The patient has not had any exacerbation of diastolic dysfunction or other coronary artery disease anginal symptoms and therefore is at lowest risk possible for further evaluation and treatment options  Past Medical History  Diagnosis Date  . Hypertension   . ESRD (end stage renal disease) ([place])     On Tue-Thur-Sat dialysis  . Chronic diastolic CHF (congestive heart failure) ([place])     a. echo 03/2015: EF 60-65%, normal wall motion, diastolic parameters were c/w restrictive physiology,  indicative of decreased LV diastolic compliance and/or increased LA pressure, mild AS, mild MR, left atrium was severely dilated, RA was severely dilated, PASP was severely increased at 90 mm Hg  . Anemia   . Hyperparathyroidism, secondary renal ([place])   . Coronary artery disease, non-occlusive     a. cardiac cath 2013  . H/O ischemic right MCA stroke     a. 03/2015; b. residual left-sided facial droop and slurred speech  . A-fib ([facility])     a. on warfarin  . Pulmonary HTN ([place])     as above  . Symptomatic bradycardia     a. history of symptomatic bradycardia; b. felt to be 2/2 metabolic abnormalities & required temp wire but no PPM, resolved with HD  . Carotid artery stenosis     a. ultrasound 03/2015: RICA 0000000 stenosis, LICA less than [id number] stenosis      Surgical History:  Past Surgical History  Procedure Laterality Date  . Peripheral vascular catheterization N/A 02/23/2015    Procedure: A/V Shuntogram/Fistulagram;  Surgeon: Algernon Huxley, MD;  Location: [facility];  Service: Cardiovascular;  Laterality: N/A;  . Peripheral vascular catheterization N/A 02/23/2015    Procedure: A/V Shunt Intervention;  Surgeon: Algernon Huxley, MD;  Location: [facility];  Service: Cardiovascular;  Laterality: N/A;     Home Meds: Prior to Admission medications   Medication Sig Start Date End Date Taking? Authorizing Provider  amLODipine (NORVASC) 10 MG tablet Take 10 mg by mouth daily. 04/02/15  Yes Historical Provider, MD  atorvastatin (LIPITOR) 40 MG tablet Take 1 tablet (40 mg total) by mouth daily at 6 PM. 04/03/15  Yes Theodoro Grist, MD  carvedilol (  COREG) 6.25 MG tablet Take 1 tablet (6.25 mg total) by mouth 2 (two) times daily with a meal. 04/03/15  Yes Theodoro Grist, MD  feeding supplement, ENSURE COMPLETE, (ENSURE COMPLETE) LIQD Take 237 mLs by mouth 3 (three) times daily with meals. 04/03/15  Yes Theodoro Grist, MD  hydrALAZINE (APRESOLINE) 25 MG tablet Take 25 mg by mouth 3  (three) times daily.  04/07/15  Yes Historical Provider, MD  losartan (COZAAR) 100 MG tablet Take 100 mg by mouth daily.   Yes Historical Provider, MD  NEXIUM 40 MG capsule Take 40 mg by mouth daily at 12 noon.  09/11/14  Yes Historical Provider, MD  [id number] MG/5ML SOLN Take 1,334 mg by mouth 3 (three) times daily with meals.  03/26/15  Yes Historical Provider, MD  polyethylene glycol (MIRALAX / GLYCOLAX) packet Take 17 g by mouth daily as needed for mild constipation. 04/03/15  Yes Max Sane, MD  trolamine salicylate (ASPERCREME) 10 % cream Apply topically 2 (two) times daily as needed for muscle pain. 04/03/15  Yes Theodoro Grist, MD  warfarin (COUMADIN) 7.5 MG tablet Take 1 tablet (7.5 mg total) by mouth daily at 6 PM. 04/03/15  Yes Theodoro Grist, MD    Inpatient Medications:  . atorvastatin  40 mg Oral q1800  . calcium acetate (Phos Binder)  1,334 mg Oral TID WC  . carvedilol  6.25 mg Oral BID WC  . pantoprazole (PROTONIX) IV  40 mg Intravenous Q12H  . sodium chloride  3 mL Intravenous Q12H   . sodium chloride 75 mL/hr at 04/13/15 0458    Allergies: No Known Allergies  Social History   Social History  . Marital Status: Single    Spouse Name: N/A  . Number of Children: N/A  . Years of Education: N/A   Occupational History  . Not on file.   Social History Main Topics  . Smoking status: Former Smoker -- 0.25 packs/day for 30 years    Types: Cigarettes  . Smokeless tobacco: Never Used  . Alcohol Use: No  . Drug Use: No  . Sexual Activity: Not on file   Other Topics Concern  . Not on file   Social History Narrative   Lives at home independently. Has a cane for ambulation.     Family History  Problem Relation Age of Onset  . CAD Father   . Dementia Mother      Review of Systems Positive for GI bleeding weakness and fatigue Negative for: General:  chills, fever, night sweats or weight changes.  Cardiovascular: PND orthopnea syncope dizziness  Dermatological  skin lesions rashes Respiratory: Cough congestion Urologic: Frequent urination urination at night and hematuria Abdominal: negative for nausea, vomiting, diarrhea,   or hematemesis Neurologic: negative for visual changes, and/or hearing changes  All other systems reviewed and are otherwise negative except as noted above.  Labs:  Recent Labs  04/12/15 1019  TROPONINI 0.17*   Lab Results  Component Value Date   WBC 8.1 04/13/2015   HGB 6.8* 04/13/2015   HCT 20.9* 04/13/2015   MCV 89.4 04/13/2015   PLT 108* 04/13/2015    Recent Labs Lab 04/12/15 1019 04/13/15 0028  NA 140 139  K 4.6 5.4*  CL 102 104  CO2 24 24  BUN 99* 111*  CREATININE 5.68* 6.28*  CALCIUM 9.2 8.5*  PROT 6.3*  --   BILITOT 0.3  --   ALKPHOS 278*  --   ALT 16  --   AST 20  --  GLUCOSE 104* 92   Lab Results  Component Value Date   CHOL 171 03/26/2015   HDL 48 03/26/2015   LDLCALC 107* 03/26/2015   TRIG 79 03/26/2015   No results found for: DDIMER  Radiology/Studies:  Ct Abdomen Pelvis Wo Contrast  04/12/2015  CLINICAL DATA:  Gastrointestinal hemorrhage. Anemia. Significant drop in hemoglobin. Supratherapeutic INR. EXAM: CT ABDOMEN AND PELVIS WITHOUT CONTRAST TECHNIQUE: Multidetector CT imaging of the abdomen and pelvis was performed following the standard protocol without IV contrast. COMPARISON:  None. FINDINGS: Lower chest: Small bilateral pleural effusions and dependent atelectasis are present. A small pericardial effusion is present. The heart is enlarged. Coronary artery calcifications present. Hepatobiliary: No focal hepatic lesions are present. The gallbladder and common bile duct are within normal limits. Arterial calcifications are present. Pancreas: Within normal limits. Spleen: Scattered calcifications suggest prior granulomatous disease. Adrenals/Urinary Tract: The adrenal glands are within normal limits bilaterally. The kidneys are markedly atrophic and calcified. The ureters are  unremarkable. The urinary bladder is mostly collapsed. Stomach/Bowel: Diverticular changes are present in the sigmoid colon without focal inflammation to suggest diverticulitis. The more proximal colon is unremarkable. The appendix is visualized and normal. The stomach and duodenum are unremarkable. Small bowel is within normal limits. Vascular/Lymphatic: Extensive vascular calcifications are present. There is no aneurysm. Sub cm para-aortic and mesenteric lymph nodes are present. Reproductive: Hysterectomy is noted. The adnexa are within normal limits for age. Other: Diffuse mesenteric edema is present. Subcutaneous edema is present as well. There is layering low-density, nonhemorrhagic, fluid within the anatomic pelvis. A ventral abdominal hernia measures 20 mm. There is high density fat which may suggest ischemia of the fat. There is no associated bowel. Musculoskeletal: Marrow changes of end-stage renal disease are noted. Vertebral body heights and alignment are maintained. IMPRESSION: 1. Diffuse mesenteric edema and layering fluid within the anatomic pelvis. 2. No focal intraperitoneal or retroperitoneal hemorrhage. 3. No focal colonic lesion. 4. Extensive atherosclerotic disease. 5. Chronic bilateral renal atrophy. 6. Small bilateral pleural effusions and pericardial effusion. 7. Cardiomegaly without failure. Electronically Signed   By: San Morelle M.D.   On: 04/12/2015 19:11   Ct Head Wo Contrast  03/26/2015  CLINICAL DATA:  Acute onset left-sided paralysis and slurred speech. Chronic renal failure EXAM: CT HEAD WITHOUT CONTRAST TECHNIQUE: Contiguous axial images were obtained from the base of the skull through the vertex without intravenous contrast. COMPARISON:  None. FINDINGS: There is mild diffuse atrophy. There is no intracranial mass, hemorrhage, extra-axial fluid collection, or midline shift. There is patchy small vessel disease throughout the centra semiovale bilaterally. There is a focal  area of decreased attenuation in the right frontal -temporal junction, concerning for early acute infarct. Attenuation of the middle cerebral arteries is symmetric and normal bilaterally. The bony calvarium appears intact. The mastoid air cells are clear. There is carotid artery calcification in the siphon region bilaterally. There are bony changes in the calvarium consistent with secondary hyperparathyroidism from chronic renal failure. Mastoid air cells are clear bilaterally. There is opacification of a left-sided ethmoid air cell. IMPRESSION: Suspect early infarct at the right frontal -temporal junction. There is mild atrophy with patchy small vessel disease throughout the centra semiovale bilaterally. No hemorrhage. Bony changes consistent with secondary hyperparathyroidism/chronic renal failure noted. Mild left-sided ethmoid sinus disease. Critical Value/emergent results were called by telephone at the time of interpretation on 03/26/2015 at 11:49 am to Dr. Meade Maw, ED physician , who verbally acknowledged these results. Electronically Signed   By: Gwyndolyn Saxon  Jasmine December III M.D.   On: 03/26/2015 11:51   Mr Virgel Paling [id number] Contrast  03/26/2015  ADDENDUM REPORT: 03/26/2015 14:41 ADDENDUM: Study discussed by telephone with Dr. Gladstone Lighter on 03/26/2015 at 1410 hours. Electronically Signed   By: Genevie Ann M.D.   On: 03/26/2015 14:41  03/26/2015  CLINICAL DATA:  69 year old female with left facial droop noted upon arrival to dialysis this morning. Symptoms not improved. Initial encounter. EXAM: MRI HEAD WITHOUT CONTRAST MRA HEAD WITHOUT CONTRAST TECHNIQUE: Multiplanar, multiecho pulse sequences of the brain and surrounding structures were obtained without intravenous contrast. Angiographic images of the head were obtained using MRA technique without contrast. COMPARISON:  Head CT without contrast 1142 hours today. FINDINGS: MRI HEAD FINDINGS 2-3 cm area of restricted diffusion in the right MCA territory  affecting the insula and operculum primarily, but with additional scattered cortically based and subcortical white matter infarcts tracking cephalad into the right motor strip (series 100, image 38 and series 102, image 26). Associated T2 and FLAIR hyperintensity with no hemorrhage or mass effect. No contralateral or posterior fossa restricted diffusion. Major intracranial vascular flow voids are within normal limits. Superimposed bilateral patchy and confluent cerebral white matter T2 and FLAIR hyperintensity. No chronic cerebral blood products or cortical encephalomalacia. There is a small chronic lacunar infarct in the right thalamus. Brainstem and cerebellum within normal limits. No midline shift, mass effect, evidence of mass lesion, ventriculomegaly, extra-axial collection or acute intracranial hemorrhage. Cervicomedullary junction and pituitary are within normal limits. Bilateral mastoid effusions. Negative nasopharynx. Trace paranasal sinus mucosal thickening. Rightward gaze deviation. Negative paranasal sinuses. Negative scalp soft tissues. Degenerative changes in the cervical spine. Decreased T1 bone marrow signal may be related to renal osteodystrophy. MRA HEAD FINDINGS Study is mildly degraded by motion artifact. Absent antegrade flow signal in the distal left vertebral artery. Antegrade flow in the right vertebral artery and basilar. No basilar stenosis. SCA and left PCA origins are patent. Fetal type right PCA origin. Diminutive or absent left posterior communicating artery. The right PCA appears occluded in the P2 segment. The left PCA is irregular but patent. Antegrade flow in both ICA siphons. No siphon stenosis. Infundibulum at the right ophthalmic artery origin suspected. Patent carotid termini. Patent MCA and ACA origins. 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 Motion artifact degrades detail of the a  2 branches which appear patent. Left MCA M1 segment and bifurcation are patent. No left MCA branch occlusion identified. Right MCA M1 segment is patent. There is irregularity and high-grade stenosis at the right MCA bifurcation. Posterior sylvian divisions appear to remain patent. There is a flow gap at the origin of the dominant anterior division with reconstituted flow (series 107, image 12). IMPRESSION: 1. 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 Involvement of the insula, operculum, and lateral motor strip. 2. Evidence of emergent large vessel occlusion at the right MCA bifurcation, mostly affecting the dominant anterior M2 branch with reconstituted flow distally. 3. 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 4. 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 5. Moderate cerebral white matter signal changes and small chronic lacunar infarct in the right thalamus compatible with chronic small vessel disease. Electronically Signed: By: Genevie Ann M.D. On: 03/26/2015 14:04   Mr Brain Lottie Dawson  Contrast  03/26/2015  ADDENDUM REPORT: 03/26/2015 14:41 ADDENDUM: Study discussed by telephone with Dr. Gladstone Lighter on 03/26/2015 at 1410 hours. Electronically Signed   By: Genevie Ann M.D.   On: 03/26/2015 14:41  03/26/2015  CLINICAL DATA:  69 year old female with left facial droop noted upon arrival to dialysis this morning. Symptoms not improved. Initial encounter. EXAM: MRI HEAD WITHOUT CONTRAST MRA HEAD WITHOUT CONTRAST TECHNIQUE: Multiplanar, multiecho pulse sequences of the brain and surrounding structures were obtained without intravenous contrast. Angiographic images of the head were obtained using MRA technique without contrast. COMPARISON:  Head CT without contrast 1142 hours today. FINDINGS: MRI HEAD FINDINGS 2-3 cm area of restricted  diffusion in the right MCA territory affecting the insula and operculum primarily, but with additional scattered cortically based and subcortical white matter infarcts tracking cephalad into the right motor strip (series 100, image 38 and series 102, image 26). Associated T2 and FLAIR hyperintensity with no hemorrhage or mass effect. No contralateral or posterior fossa restricted diffusion. Major intracranial vascular flow voids are within normal limits. Superimposed bilateral patchy and confluent cerebral white matter T2 and FLAIR hyperintensity. No chronic cerebral blood products or cortical encephalomalacia. There is a small chronic lacunar infarct in the right thalamus. Brainstem and cerebellum within normal limits. No midline shift, mass effect, evidence of mass lesion, ventriculomegaly, extra-axial collection or acute intracranial hemorrhage. Cervicomedullary junction and pituitary are within normal limits. Bilateral mastoid effusions. Negative nasopharynx. Trace paranasal sinus mucosal thickening. Rightward gaze deviation. Negative paranasal sinuses. Negative scalp soft tissues. Degenerative changes in the cervical spine. Decreased T1 bone marrow signal may be related to renal osteodystrophy. MRA HEAD FINDINGS Study is mildly degraded by motion artifact. Absent antegrade flow signal in the distal left vertebral artery. Antegrade flow in the right vertebral artery and basilar. No basilar stenosis. SCA and left PCA origins are patent. Fetal type right PCA origin. Diminutive or absent left posterior communicating artery. The right PCA appears occluded in the P2 segment. The left PCA is irregular but patent. Antegrade flow in both ICA siphons. No siphon stenosis. Infundibulum at the right ophthalmic artery origin suspected. Patent carotid termini. Patent MCA and ACA origins. 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  Motion artifact degrades detail of the a 2 branches which appear patent. Left MCA M1 segment and bifurcation are patent. No left MCA branch occlusion identified. Right MCA M1 segment is patent. There is irregularity and high-grade stenosis at the right MCA bifurcation. Posterior sylvian divisions appear to remain patent. There is a flow gap at the origin of the dominant anterior division with reconstituted flow (series 107, image 12). IMPRESSION: 1. 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 Involvement of the insula, operculum, and lateral motor strip. 2. Evidence of emergent large vessel occlusion at the right MCA bifurcation, mostly affecting the dominant anterior M2 branch with reconstituted flow distally. 3. 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 4. 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 5. Moderate cerebral white matter signal changes and small chronic lacunar infarct in the right thalamus compatible with chronic small vessel disease. Electronically Signed: By: Genevie Ann M.D. On: 03/26/2015 14:04   US Carotid Bilateral  03/26/2015  CLINICAL DATA:  Cerebral infarction, personal history of hypertension, CHF, end-stage  renal disease, smoking EXAM: BILATERAL CAROTID DUPLEX ULTRASOUND TECHNIQUE: Pearline Cables scale imaging, color Doppler and duplex ultrasound were performed of bilateral carotid and vertebral arteries in the neck. COMPARISON:  None FINDINGS: Criteria: Quantification of carotid stenosis is based on velocity parameters that correlate the residual internal carotid diameter with NASCET-based stenosis levels, using the diameter of the distal internal carotid lumen as the denominator for stenosis measurement. The following velocity measurements were obtained: RIGHT ICA:  167/43 cm/sec CCA:  0000000 cm/sec SYSTOLIC ICA/CCA RATIO:   [id number] DIASTOLIC ICA/CCA RATIO:  [id number] ECA:  77 cm/sec LEFT ICA:  120/32 cm/sec CCA:  [id number] cm/sec SYSTOLIC ICA/CCA RATIO:  [id number] DIASTOLIC ICA/CCA RATIO:  [id number] ECA:  144 cm/sec RIGHT CAROTID ARTERY: Intimal thickening RIGHT CCA. Small echogenic non shadowing plaque RIGHT carotid bulb. Additional shadowing plaque at distal carotid bulb. Laminar flow on color Doppler imaging. Spectral broadening RIGHT ICA on waveform analysis. No discrete high velocity jet. Mildly tortuous RIGHT carotid system. RIGHT VERTEBRAL ARTERY:  Patent, antegrade LEFT CAROTID ARTERY: Mildly tortuous LEFT carotid system. Intimal thickening LEFT CCA. Echogenic shadowing and non shadowing plaque LEFT carotid bulb extending in the proximal LEFT ICA. Turbulent flow on color Doppler imaging within LEFT ICA. Spectral broadening LEFT ICA on waveform analysis. No high velocity jets. LEFT VERTEBRAL ARTERY:  Patent, antegrade IMPRESSION: Plaque formation at the carotid bifurcations bilaterally associated with turbulent flow at the proximal LEFT ICA. Velocity measurements correspond to a 50-69% diameter stenosis of the RIGHT carotid and a less than 50% diameter stenosis of the LEFT carotid. Electronically Signed   By: Lavonia Dana M.D.   On: 03/26/2015 15:20   Dg Chest Port 1 View  04/12/2015  CLINICAL DATA:  Vomiting and diarrhea since last night after dialysis. Left-sided weakness 2 weeks. EXAM: PORTABLE CHEST 1 VIEW COMPARISON:  11/26/2013 FINDINGS: Lungs are adequately inflated with hazy prominence of the perihilar markings suggesting mild vascular congestion. There is moderate stable cardiomegaly. No significant effusion. Moderate calcified plaque over the thoracoabdominal aorta. Remainder of the exam is unchanged. IMPRESSION: Moderate stable cardiomegaly with evidence of mild vascular congestion. Electronically Signed   By: Marin Olp M.D.   On: 04/12/2015 11:26    EKG: Atrial fibrillation with controlled ventricular rate and bundle branch  block  Weights: Filed Weights   04/12/15 1012  Weight: 150 lb (68.04 kg)     Physical Exam: Blood pressure 124/68, pulse 82, temperature 97.8 F (36.6 C), temperature source Oral, resp. rate 15, height 5\' 6"  (1.676 m), weight 150 lb (68.04 kg), SpO2 100 %. Body mass index is 24.22 kg/(m^2). General: Well developed, well nourished, in no acute distress. Head eyes ears nose throat: Normocephalic, atraumatic, sclera non-icteric, no xanthomas, nares are without discharge. No apparent thyromegaly and/or mass  Lungs: Normal respiratory effort.  no wheezes, no rales, no rhonchi.  Heart: Irregular with normal S1 S2. no murmur gallop, no rub, PMI is normal size and placement, carotid upstroke normal without bruit, jugular venous pressure is normal Abdomen: Soft, non-tender, non-distended with normoactive bowel sounds. No hepatomegaly. No rebound/guarding. No obvious abdominal masses. Abdominal aorta is normal size without bruit Extremities: Trace edema. no cyanosis, no clubbing, no ulcers  Peripheral : 2+ bilateral upper extremity pulses, 2+ bilateral femoral pulses, 2+ bilateral dorsal pedal pulse with dialysis shunt Neuro: Alert and oriented. No facial asymmetry. Patient does have some speech issues Musculoskeletal: Normal muscle tone without kyphosis Psych:  Responds to questions appropriately with a normal affect.    Assessment: 69 year old female with  chronic diastolic dysfunction congestive heart failure multifactorial in nature including chronic kidney disease stage V with dialysis essential hypertension and carotid atherosclerosis coronary artery disease with chronic nonvalvular atrial fibrillation status post stroke and now GI bleed due to use of Coumadin  Plan: 1. Continue carvedilol for heart rate control of atrial fibrillation and treatment of diastolic dysfunction heart failure without change today 2. Abstain from Coumadin or other anticoagulation due to GI bleed until patient has  further workup and assessment of source of bleeding 3. High intensity cholesterol therapy with atorvastatin for further risk reduction of coronary artery disease 4. Proceed to further diagnostic testing including upper endoscopy and/or colonoscopy as necessary for further evaluation of bleeding source with patient at low risk for complication with procedure 5. No further cardiac diagnostics necessary at this time  Signed, Corey Skains M.D. [facility] 04/13/2015, 7:26 AM

## 2015-04-13 NOTE — Progress Notes (Signed)
Post hd tx 

## 2015-04-14 ENCOUNTER — Encounter
Admission: EM | Disposition: A | Discharge status: Home / Self Care | Payer: Self-pay | Source: Home / Self Care | Attending: Internal Medicine

## 2015-04-14 ENCOUNTER — Inpatient Hospital Stay: Payer: Medicare Other | Admitting: Anesthesiology

## 2015-04-14 DIAGNOSIS — K259 Gastric ulcer, unspecified as acute or chronic, without hemorrhage or perforation: Secondary | ICD-10-CM | POA: Insufficient documentation

## 2015-04-14 HISTORY — PX: ESOPHAGOGASTRODUODENOSCOPY (EGD) WITH PROPOFOL: SHX5813

## 2015-04-14 LAB — PREPARE RBC (CROSSMATCH)

## 2015-04-14 LAB — MRSA PCR SCREENING: MRSA by PCR: NEGATIVE

## 2015-04-14 LAB — HEMOGLOBIN
HEMOGLOBIN: 7.4 g/dL — AB (ref 12.0–16.0)
Hemoglobin: 6.7 g/dL — ABNORMAL LOW (ref 12.0–16.0)

## 2015-04-14 LAB — PARATHYROID HORMONE, INTACT (NO CA): PTH: 2477 pg/mL — AB (ref 15–65)

## 2015-04-14 LAB — POTASSIUM: Potassium: 4 mmol/L (ref 3.5–5.1)

## 2015-04-14 SURGERY — ESOPHAGOGASTRODUODENOSCOPY (EGD) WITH PROPOFOL
Anesthesia: General

## 2015-04-14 MED ORDER — LIDOCAINE HCL (CARDIAC) 20 MG/ML IV SOLN
INTRAVENOUS | Status: DC | PRN
  Administered 2015-04-14: 40 mg via INTRAVENOUS

## 2015-04-14 MED ORDER — MIDAZOLAM HCL 2 MG/2ML IJ SOLN
INTRAMUSCULAR | Status: DC | PRN
  Administered 2015-04-14: 1 mg via INTRAVENOUS

## 2015-04-14 MED ORDER — PROPOFOL 500 MG/50ML IV EMUL
INTRAVENOUS | Status: DC | PRN
Start: 2015-04-14 — End: 2015-04-14
  Administered 2015-04-14: 140 ug/kg/min via INTRAVENOUS

## 2015-04-14 MED ORDER — SODIUM CHLORIDE 0.9 % IV SOLN
Freq: Once | INTRAVENOUS | Status: AC
  Administered 2015-04-14: 12:00:00 via INTRAVENOUS

## 2015-04-14 NOTE — Progress Notes (Signed)
[facility]  Patient Status: active  [facility] is providing the following services: SN/PT  If patient discharges after hours, please call [phone].   Toni Carlson 04/14/2015, 10:30 AM

## 2015-04-14 NOTE — Progress Notes (Signed)
Patient agitated  regarding the whole situation.Did not want me to take care of her. Requested another nurse. Charge nurse notified.

## 2015-04-14 NOTE — Progress Notes (Signed)
patient refused dialysis today. Dr. Posey Pronto made aware.

## 2015-04-14 NOTE — Anesthesia Preprocedure Evaluation (Signed)
Anesthesia Evaluation  Patient identified by MRN, date of birth, ID band Patient awake    Reviewed: Allergy & Precautions, H&P , NPO status , Patient's Chart, lab work & pertinent test results, reviewed documented beta blocker date and time   Airway Mallampati: II  TM Distance: >3 FB Neck ROM: full    Dental no notable dental hx. (+) Edentulous Upper, Edentulous Lower   Pulmonary neg pulmonary ROS, former smoker,    Pulmonary exam normal breath sounds clear to auscultation       Cardiovascular Exercise Tolerance: Good hypertension, + CAD, + Peripheral Vascular Disease and +CHF  negative cardio ROS   Rhythm:regular Rate:Normal  Echo noted    Neuro/Psych negative neurological ROS  negative psych ROS   GI/Hepatic negative GI ROS, Neg liver ROS,   Endo/Other  negative endocrine ROS  Renal/GU Renal diseasenegative Renal ROS  negative genitourinary   Musculoskeletal   Abdominal   Peds  Hematology negative hematology ROS (+) anemia ,   Anesthesia Other Findings   Reproductive/Obstetrics negative OB ROS                             Anesthesia Physical Anesthesia Plan  ASA: III and emergent  Anesthesia Plan: General   Post-op Pain Management:    Induction:   Airway Management Planned:   Additional Equipment:   Intra-op Plan:   Post-operative Plan:   Informed Consent: I have reviewed the patients History and Physical, chart, labs and discussed the procedure including the risks, benefits and alternatives for the proposed anesthesia with the patient or authorized representative who has indicated his/her understanding and acceptance.   Dental Advisory Given  Plan Discussed with: CRNA  Anesthesia Plan Comments:         Anesthesia Quick Evaluation

## 2015-04-14 NOTE — Op Note (Signed)
[facility] Patient Name: Toni Carlson Procedure Date: 04/14/2015 11:50 AM MRN: [id number] Account #: [id number] Date of Birth: 01-11-1946 Admit Type: Inpatient Age: 69 Room: [facility] ENDO ROOM 4 Gender: Female Note Status: Finalized Procedure:         Upper GI endoscopy Indications:       Melena Providers:         Lucilla Lame, MD Referring MD:      No Local Md, MD (Referring MD) Medicines:         Propofol per Anesthesia Complications:     No immediate complications. Procedure:         Pre-Anesthesia Assessment:                    - Prior to the procedure, a History and Physical was                     performed, and patient medications and allergies were                     reviewed. The patient's tolerance of previous anesthesia                     was also reviewed. The risks and benefits of the procedure                     and the sedation options and risks were discussed with the                     patient. All questions were answered, and informed consent                     was obtained. Prior Anticoagulants: The patient has taken                     no previous anticoagulant or antiplatelet agents. ASA                     Grade Assessment: II - A patient with mild systemic                     disease. After reviewing the risks and benefits, the                     patient was deemed in satisfactory condition to undergo                     the procedure.                    After obtaining informed consent, the endoscope was passed                     under direct vision. Throughout the procedure, the                     patient's blood pressure, pulse, and oxygen saturations                     were monitored continuously. The Olympus GIF-160 endoscope                     (S#. [id number]) was introduced through the mouth, and  advanced to the second part of duodenum. The upper GI                     endoscopy was accomplished  without difficulty. The patient                     tolerated the procedure well. Findings:      The examined esophagus was normal.      One non-bleeding cratered gastric ulcer with no stigmata of bleeding was       found in the gastric antrum.      The examined duodenum was normal. Impression:        - Normal esophagus.                    - Gastric ulcer with clean base.                    - Normal examined duodenum.                    - No specimens collected. Recommendation:    - Use a proton pump inhibitor PO daily. Procedure Code(s): --- Professional ---                    [phone], Esophagogastroduodenoscopy, flexible, transoral;                     diagnostic, including collection of specimen(s) by                     brushing or washing, when performed (separate procedure) Diagnosis Code(s): --- Professional ---                    K92.1, Melena                    K25.9, Gastric ulcer, unspecified as acute or chronic,                     without hemorrhage or perforation CPT copyright 2014 American [employer] Association. All rights reserved. The codes documented in this report are preliminary and upon coder review may  be revised to meet current compliance requirements. Lucilla Lame, MD 04/14/2015 12:01:14 PM This report has been signed electronically. Number of Addenda: 0 Note Initiated On: 04/14/2015 11:50 AM      [facility]

## 2015-04-14 NOTE — Care Management (Signed)
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  A front rolling wlalker will not resolve issue with performing activities of daily living. A wheelchair will allow patient to safely perform daily activities. Patient can safely propel the wheelchair in the home or has a caregiver who can provide assistance.  Accessories: elevating leg rests (ELRs), wheel locks, extensions and anti-tippers.

## 2015-04-14 NOTE — [facility] End of Treatment Summary (Signed)
Recovery completed returned to nursing unit via bed.  Vital signs stable.

## 2015-04-14 NOTE — Discharge Summary (Deleted)
[facility] [place] NAME: Toni Carlson    MR#:  [id number]  DATE OF BIRTH:  07-22-1945  SUBJECTIVE:   Doing well. No complaints REVIEW OF SYSTEMS:   Review of Systems  Constitutional: Negative for fever, chills and weight loss.  HENT: Negative for ear discharge, ear pain and nosebleeds.   Eyes: Negative for blurred vision, pain and discharge.  Respiratory: Negative for sputum production, shortness of breath, wheezing and stridor.   Cardiovascular: Negative for chest pain, palpitations, orthopnea and PND.  Gastrointestinal: Negative for nausea, vomiting, abdominal pain and diarrhea.  Genitourinary: Negative for urgency and frequency.  Musculoskeletal: Negative for back pain and joint pain.  Neurological: Negative for sensory change, speech change, focal weakness and weakness.  Psychiatric/Behavioral: Negative for depression and hallucinations. The patient is not nervous/anxious.    Tolerating Diet:yes  DRUG ALLERGIES:  No Known Allergies  VITALS:  Blood pressure 127/48, pulse 67, temperature 96.5 F (35.8 C), temperature source Axillary, resp. rate 19, height 5\' 6"  (1.676 m), weight 145 lb 15.1 oz (66.2 kg), SpO2 92 %.  PHYSICAL EXAMINATION:   Physical Exam  GENERAL:  69 y.o.-year-old patient lying in the bed with no acute distress.  EYES: Pupils equal, round, reactive to light and accommodation. No scleral icterus. Extraocular muscles intact.  HEENT: Head atraumatic, normocephalic. Oropharynx and nasopharynx clear.  NECK:  Supple, no jugular venous distention. No thyroid enlargement, no tenderness.  LUNGS: Normal breath sounds bilaterally, no wheezing, rales, rhonchi. No use of accessory muscles of respiration.  CARDIOVASCULAR: S1, S2 normal. No murmurs, rubs, or gallops.  ABDOMEN: Soft, nontender, nondistended. Bowel sounds present. No organomegaly or mass.  EXTREMITIES: No cyanosis, clubbing or edema b/l.    NEUROLOGIC:left  facial droop and wekaness, chronic weakness due to stroke PSYCHIATRIC: The patient is alert and oriented x 3.  SKIN: No obvious rash, lesion, or ulcer.    LABORATORY PANEL:   CBC  Recent Labs Lab 04/13/15 0028 04/14/15 0441  WBC 8.1  --   HGB 6.8* 6.7*  HCT 20.9*  --   PLT 108*  --     Chemistries   Recent Labs Lab 04/12/15 1019 04/13/15 0028 04/14/15 0906  NA 140 139  --   K 4.6 5.4* 4.0  CL 102 104  --   CO2 24 24  --   GLUCOSE 104* 92  --   BUN 99* 111*  --   CREATININE 5.68* 6.28*  --   CALCIUM 9.2 8.5*  --   AST 20  --   --   ALT 16  --   --   ALKPHOS 278*  --   --   BILITOT 0.3  --   --     Cardiac Enzymes  Recent Labs Lab 04/12/15 1019  TROPONINI 0.17*    RADIOLOGY:  Ct Abdomen Pelvis Wo Contrast  04/12/2015  CLINICAL DATA:  Gastrointestinal hemorrhage. Anemia. Significant drop in hemoglobin. Supratherapeutic INR. EXAM: CT ABDOMEN AND PELVIS WITHOUT CONTRAST TECHNIQUE: Multidetector CT imaging of the abdomen and pelvis was performed following the standard protocol without IV contrast. COMPARISON:  None. FINDINGS: Lower chest: Small bilateral pleural effusions and dependent atelectasis are present. A small pericardial effusion is present. The heart is enlarged. Coronary artery calcifications present. Hepatobiliary: No focal hepatic lesions are present. The gallbladder and common bile duct are within normal limits. Arterial calcifications are present. Pancreas: Within normal limits. Spleen: Scattered calcifications suggest prior granulomatous disease. Adrenals/Urinary Tract: The adrenal  glands are within normal limits bilaterally. The kidneys are markedly atrophic and calcified. The ureters are unremarkable. The urinary bladder is mostly collapsed. Stomach/Bowel: Diverticular changes are present in the sigmoid colon without focal inflammation to suggest diverticulitis. The more proximal colon is unremarkable. The appendix is visualized and normal. The stomach  and duodenum are unremarkable. Small bowel is within normal limits. Vascular/Lymphatic: Extensive vascular calcifications are present. There is no aneurysm. Sub cm para-aortic and mesenteric lymph nodes are present. Reproductive: Hysterectomy is noted. The adnexa are within normal limits for age. Other: Diffuse mesenteric edema is present. Subcutaneous edema is present as well. There is layering low-density, nonhemorrhagic, fluid within the anatomic pelvis. A ventral abdominal hernia measures 20 mm. There is high density fat which may suggest ischemia of the fat. There is no associated bowel. Musculoskeletal: Marrow changes of end-stage renal disease are noted. Vertebral body heights and alignment are maintained. IMPRESSION: 1. Diffuse mesenteric edema and layering fluid within the anatomic pelvis. 2. No focal intraperitoneal or retroperitoneal hemorrhage. 3. No focal colonic lesion. 4. Extensive atherosclerotic disease. 5. Chronic bilateral renal atrophy. 6. Small bilateral pleural effusions and pericardial effusion. 7. Cardiomegaly without failure. Electronically Signed   By: San Morelle M.D.   On: 04/12/2015 19:11     ASSESSMENT AND PLAN:   69 year old female with recent embolic stroke discharged from our hospital on November 16 2 presents with dark colored stools, acute on chronic anemia and elevated INR.  1. GI bleed: Patient presents with dark colored stools, acute on chronic anemia with acute blood loss and an elevated INR. The likely reason is Coumadin. Coumadin is been discontinued. - She will require EGD at some point.  -s/p 2 units of PRBCs transfusion. -Continue to monitor hemoglobin every 12 hours. - Continue Protonix 40 IV twice a day. -5.3--->4.6--->2 units PRBC-->6.8-->1 unit PRBC today -sEGD showed small non-bleeding gastric ulcer  2. Acute blood loss anemia: Secondary to supratherapeutic INR. Blood transfusion as mentioned above. Continue monitor hemoglobin. -INR  12.3--->vi K --->1.5 -pt will stay off coumadin for now  3. End-stage renal disease on hemodialysis: Nephrology has been consultative. Patient has dialysis Tuesday, Thursday and Saturday -elevated K.got HD y'day. Pt declines  To do HD today(her routine day)  4. Recent embolic stroke: Due to problem #1. Coumadin has been stopped. Pt has chronic neuro deficits  5. Atrial fibrillation: Coumadin needs to be stopped due to problem #1.  Cardiology input noted - Continue Coreg for heart rate control.  6. Essential hypertension: -will continue Coreg and losartan   Will d/c tomorrow. Spoke with dter [name] [phone]  Case discussed with Care Management/Social Worker. Management plans discussed with the patient, family and they are in agreement.  CODE STATUS: full  DVT Prophylaxis: SCD/TED  TOTAL TIME TAKING CARE OF THIS PATIENT: 30 minutes.  >50% time spent on counselling and coordination of care  POSSIBLE D/C IN AM DAYS DEPENDING ON CLINICAL CONDITION.   Lizvet Chunn M.D on 04/14/2015 at 2:39 PM  Between 7am to 6pm - Pager - [phone]  After 6pm go to [email or web address] - password [email or web address] [facility]  Office  [phone]  CC: Primary care physician; Murlean Iba, MD

## 2015-04-14 NOTE — Plan of Care (Signed)
Problem: Activity: Goal: Risk for activity intolerance will decrease Outcome: Not Progressing Difficulty walking at this time.  Problem: Nutrition: Goal: Adequate nutrition will be maintained Outcome: Not Progressing NPO

## 2015-04-14 NOTE — Progress Notes (Signed)
[facility] Cardiology [facility] Encounter Note  Patient: Toni Carlson / Admit Date: 04/12/2015 / Date of Encounter: 04/14/2015, 8:17 AM   Subjective: No change in neurologic status. Hemodynamically stable. No evidence of significant new cardiac symptoms. No apparent bleeding  Review of Systems: Positive for: Bleeding Negative for: Vision change, hearing change, syncope, dizziness, nausea, vomiting,diarrhea, stomach pain, cough, congestion, diaphoresis, urinary frequency, urinary pain,skin lesions, skin rashes Others previously listed  Objective: Telemetry: Atrial fibrillation Physical Exam: Blood pressure 102/54, pulse 66, temperature 97.5 F (36.4 C), temperature source Oral, resp. rate 14, height 5\' 6"  (1.676 m), weight 145 lb 15.1 oz (66.2 kg), SpO2 98 %. Body mass index is 23.57 kg/(m^2). General: Well developed, well nourished, in no acute distress. Head: Normocephalic, atraumatic, sclera non-icteric, no xanthomas, nares are without discharge. Neck: No apparent masses Lungs: Normal respirations with no wheezes, no rhonchi, no rales , few crackles   Heart: Irregular rate and rhythm, normal S1 S2, no murmur, no rub, no gallop, PMI is normal size and placement, carotid upstroke normal without bruit, jugular venous pressure normal Abdomen: Soft, non-tender, non-distended with normoactive bowel sounds. No hepatosplenomegaly. Abdominal aorta is normal size without bruit Extremities: Trace edema, no clubbing, no cyanosis, no ulcers,  Peripheral: 2+ radial, 2+ femoral, 2+ dorsal pedal pulses Neuro: Alert and oriented. Has neurologic damage from stroke Psych:  Responds to questions appropriately with a normal affect.   Intake/Output Summary (Last 24 hours) at 04/14/15 0817 Last data filed at 04/14/15 0530  Gross per 24 hour  Intake   1716 ml  Output    200 ml  Net   1516 ml    Inpatient Medications:  . atorvastatin  40 mg Oral q1800  . calcium acetate (Phos Binder)  1,334 mg  Oral TID WC  . carvedilol  6.25 mg Oral BID WC  . pantoprazole (PROTONIX) IV  40 mg Intravenous Q12H  . sodium chloride  3 mL Intravenous Q12H   Infusions:  . sodium chloride 50 mL/hr at 04/13/15 2126    Labs:  Recent Labs  04/12/15 1019 04/13/15 0028 04/13/15 1048  NA 140 139  --   K 4.6 5.4*  --   CL 102 104  --   CO2 24 24  --   GLUCOSE 104* 92  --   BUN 99* 111*  --   CREATININE 5.68* 6.28*  --   CALCIUM 9.2 8.5*  --   PHOS  --   --  5.6*    Recent Labs  04/12/15 1019  AST 20  ALT 16  ALKPHOS 278*  BILITOT 0.3  PROT 6.3*  ALBUMIN 3.0*    Recent Labs  04/12/15 1019 04/12/15 1351 04/13/15 0028 04/14/15 0441  WBC 7.7  --  8.1  --   HGB 5.3* 4.6* 6.8* 6.7*  HCT 17.4* 14.7* 20.9*  --   MCV 93.3  --  89.4  --   PLT 126*  --  108*  --     Recent Labs  04/12/15 1019  TROPONINI 0.17*   Invalid input(s): POCBNP No results for input(s): HGBA1C in the last 72 hours.   Weights: Filed Weights   04/12/15 1012 04/13/15 0955 04/13/15 1322  Weight: 150 lb (68.04 kg) 146 lb 9.7 oz (66.5 kg) 145 lb 15.1 oz (66.2 kg)     Radiology/Studies:  Ct Abdomen Pelvis Wo Contrast  04/12/2015  CLINICAL DATA:  Gastrointestinal hemorrhage. Anemia. Significant drop in hemoglobin. Supratherapeutic INR. EXAM: CT ABDOMEN AND PELVIS WITHOUT CONTRAST  TECHNIQUE: Multidetector CT imaging of the abdomen and pelvis was performed following the standard protocol without IV contrast. COMPARISON:  None. FINDINGS: Lower chest: Small bilateral pleural effusions and dependent atelectasis are present. A small pericardial effusion is present. The heart is enlarged. Coronary artery calcifications present. Hepatobiliary: No focal hepatic lesions are present. The gallbladder and common bile duct are within normal limits. Arterial calcifications are present. Pancreas: Within normal limits. Spleen: Scattered calcifications suggest prior granulomatous disease. Adrenals/Urinary Tract: The adrenal glands  are within normal limits bilaterally. The kidneys are markedly atrophic and calcified. The ureters are unremarkable. The urinary bladder is mostly collapsed. Stomach/Bowel: Diverticular changes are present in the sigmoid colon without focal inflammation to suggest diverticulitis. The more proximal colon is unremarkable. The appendix is visualized and normal. The stomach and duodenum are unremarkable. Small bowel is within normal limits. Vascular/Lymphatic: Extensive vascular calcifications are present. There is no aneurysm. Sub cm para-aortic and mesenteric lymph nodes are present. Reproductive: Hysterectomy is noted. The adnexa are within normal limits for age. Other: Diffuse mesenteric edema is present. Subcutaneous edema is present as well. There is layering low-density, nonhemorrhagic, fluid within the anatomic pelvis. A ventral abdominal hernia measures 20 mm. There is high density fat which may suggest ischemia of the fat. There is no associated bowel. Musculoskeletal: Marrow changes of end-stage renal disease are noted. Vertebral body heights and alignment are maintained. IMPRESSION: 1. Diffuse mesenteric edema and layering fluid within the anatomic pelvis. 2. No focal intraperitoneal or retroperitoneal hemorrhage. 3. No focal colonic lesion. 4. Extensive atherosclerotic disease. 5. Chronic bilateral renal atrophy. 6. Small bilateral pleural effusions and pericardial effusion. 7. Cardiomegaly without failure. Electronically Signed   By: San Morelle M.D.   On: 04/12/2015 19:11   Ct Head Wo Contrast  03/26/2015  CLINICAL DATA:  Acute onset left-sided paralysis and slurred speech. Chronic renal failure EXAM: CT HEAD WITHOUT CONTRAST TECHNIQUE: Contiguous axial images were obtained from the base of the skull through the vertex without intravenous contrast. COMPARISON:  None. FINDINGS: There is mild diffuse atrophy. There is no intracranial mass, hemorrhage, extra-axial fluid collection, or midline  shift. There is patchy small vessel disease throughout the centra semiovale bilaterally. There is a focal area of decreased attenuation in the right frontal -temporal junction, concerning for early acute infarct. Attenuation of the middle cerebral arteries is symmetric and normal bilaterally. The bony calvarium appears intact. The mastoid air cells are clear. There is carotid artery calcification in the siphon region bilaterally. There are bony changes in the calvarium consistent with secondary hyperparathyroidism from chronic renal failure. Mastoid air cells are clear bilaterally. There is opacification of a left-sided ethmoid air cell. IMPRESSION: Suspect early infarct at the right frontal -temporal junction. There is mild atrophy with patchy small vessel disease throughout the centra semiovale bilaterally. No hemorrhage. Bony changes consistent with secondary hyperparathyroidism/chronic renal failure noted. Mild left-sided ethmoid sinus disease. Critical Value/emergent results were called by telephone at the time of interpretation on 03/26/2015 at 11:49 am to Dr. Meade Maw, ED physician , who verbally acknowledged these results. Electronically Signed   By: Lowella Grip III M.D.   On: 03/26/2015 11:51   Mr Virgel Paling [id number] Contrast  03/26/2015  ADDENDUM REPORT: 03/26/2015 14:41 ADDENDUM: Study discussed by telephone with Dr. Gladstone Lighter on 03/26/2015 at 1410 hours. Electronically Signed   By: Genevie Ann M.D.   On: 03/26/2015 14:41  03/26/2015  CLINICAL DATA:  69 year old female with left facial droop noted upon arrival to dialysis  this morning. Symptoms not improved. Initial encounter. EXAM: MRI HEAD WITHOUT CONTRAST MRA HEAD WITHOUT CONTRAST TECHNIQUE: Multiplanar, multiecho pulse sequences of the brain and surrounding structures were obtained without intravenous contrast. Angiographic images of the head were obtained using MRA technique without contrast. COMPARISON:  Head CT without contrast 1142  hours today. FINDINGS: MRI HEAD FINDINGS 2-3 cm area of restricted diffusion in the right MCA territory affecting the insula and operculum primarily, but with additional scattered cortically based and subcortical white matter infarcts tracking cephalad into the right motor strip (series 100, image 38 and series 102, image 26). Associated T2 and FLAIR hyperintensity with no hemorrhage or mass effect. No contralateral or posterior fossa restricted diffusion. Major intracranial vascular flow voids are within normal limits. Superimposed bilateral patchy and confluent cerebral white matter T2 and FLAIR hyperintensity. No chronic cerebral blood products or cortical encephalomalacia. There is a small chronic lacunar infarct in the right thalamus. Brainstem and cerebellum within normal limits. No midline shift, mass effect, evidence of mass lesion, ventriculomegaly, extra-axial collection or acute intracranial hemorrhage. Cervicomedullary junction and pituitary are within normal limits. Bilateral mastoid effusions. Negative nasopharynx. Trace paranasal sinus mucosal thickening. Rightward gaze deviation. Negative paranasal sinuses. Negative scalp soft tissues. Degenerative changes in the cervical spine. Decreased T1 bone marrow signal may be related to renal osteodystrophy. MRA HEAD FINDINGS Study is mildly degraded by motion artifact. Absent antegrade flow signal in the distal left vertebral artery. Antegrade flow in the right vertebral artery and basilar. No basilar stenosis. SCA and left PCA origins are patent. Fetal type right PCA origin. Diminutive or absent left posterior communicating artery. The right PCA appears occluded in the P2 segment. The left PCA is irregular but patent. Antegrade flow in both ICA siphons. No siphon stenosis. Infundibulum at the right ophthalmic artery origin suspected. Patent carotid termini. Patent MCA and ACA origins. 2-3 mm medially and posteriorly directed aneurysm arising from the left  81 a-82 junction (series 2, image 98 and series 10007, image 9. Motion artifact degrades detail of the a 2 branches which appear patent. Left MCA M1 segment and bifurcation are patent. No left MCA branch occlusion identified. Right MCA M1 segment is patent. There is irregularity and high-grade stenosis at the right MCA bifurcation. Posterior sylvian divisions appear to remain patent. There is a flow gap at the origin of the dominant anterior division with reconstituted flow (series 107, image 12). IMPRESSION: 1. 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 Involvement of the insula, operculum, and lateral motor strip. 2. Evidence of emergent large vessel occlusion at the right MCA bifurcation, mostly affecting the dominant anterior M2 branch with reconstituted flow distally. 3. 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 4. 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 5. Moderate cerebral white matter signal changes and small chronic lacunar infarct in the right thalamus compatible with chronic small vessel disease. Electronically Signed: By: Genevie Ann M.D. On: 03/26/2015 14:04   Mr Brain Wo Contrast  03/26/2015  ADDENDUM REPORT: 03/26/2015 14:41 ADDENDUM: Study discussed by telephone with Dr. Gladstone Lighter on 03/26/2015 at 1410 hours. Electronically Signed   By: Genevie Ann M.D.   On: 03/26/2015 14:41  03/26/2015  CLINICAL DATA:  69 year old female with left facial droop noted upon arrival to dialysis this morning. Symptoms not improved. Initial encounter. EXAM: MRI HEAD WITHOUT CONTRAST MRA HEAD  WITHOUT CONTRAST TECHNIQUE: Multiplanar, multiecho pulse sequences of the brain and surrounding structures were obtained without intravenous contrast. Angiographic images of the head were obtained using MRA technique without contrast. COMPARISON:   Head CT without contrast 1142 hours today. FINDINGS: MRI HEAD FINDINGS 2-3 cm area of restricted diffusion in the right MCA territory affecting the insula and operculum primarily, but with additional scattered cortically based and subcortical white matter infarcts tracking cephalad into the right motor strip (series 100, image 38 and series 102, image 26). Associated T2 and FLAIR hyperintensity with no hemorrhage or mass effect. No contralateral or posterior fossa restricted diffusion. Major intracranial vascular flow voids are within normal limits. Superimposed bilateral patchy and confluent cerebral white matter T2 and FLAIR hyperintensity. No chronic cerebral blood products or cortical encephalomalacia. There is a small chronic lacunar infarct in the right thalamus. Brainstem and cerebellum within normal limits. No midline shift, mass effect, evidence of mass lesion, ventriculomegaly, extra-axial collection or acute intracranial hemorrhage. Cervicomedullary junction and pituitary are within normal limits. Bilateral mastoid effusions. Negative nasopharynx. Trace paranasal sinus mucosal thickening. Rightward gaze deviation. Negative paranasal sinuses. Negative scalp soft tissues. Degenerative changes in the cervical spine. Decreased T1 bone marrow signal may be related to renal osteodystrophy. MRA HEAD FINDINGS Study is mildly degraded by motion artifact. Absent antegrade flow signal in the distal left vertebral artery. Antegrade flow in the right vertebral artery and basilar. No basilar stenosis. SCA and left PCA origins are patent. Fetal type right PCA origin. Diminutive or absent left posterior communicating artery. The right PCA appears occluded in the P2 segment. The left PCA is irregular but patent. Antegrade flow in both ICA siphons. No siphon stenosis. Infundibulum at the right ophthalmic artery origin suspected. Patent carotid termini. Patent MCA and ACA origins. 2-3 mm medially and posteriorly directed  aneurysm arising from the left 81 a-82 junction (series 2, image 98 and series 10007, image 9. Motion artifact degrades detail of the a 2 branches which appear patent. Left MCA M1 segment and bifurcation are patent. No left MCA branch occlusion identified. Right MCA M1 segment is patent. There is irregularity and high-grade stenosis at the right MCA bifurcation. Posterior sylvian divisions appear to remain patent. There is a flow gap at the origin of the dominant anterior division with reconstituted flow (series 107, image 12). IMPRESSION: 1. 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 Involvement of the insula, operculum, and lateral motor strip. 2. Evidence of emergent large vessel occlusion at the right MCA bifurcation, mostly affecting the dominant anterior M2 branch with reconstituted flow distally. 3. 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 4. 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 5. Moderate cerebral white matter signal changes and small chronic lacunar infarct in the right thalamus compatible with chronic small vessel disease. Electronically Signed: By: Genevie Ann M.D. On: 03/26/2015 14:04   US Carotid Bilateral  03/26/2015  CLINICAL DATA:  Cerebral infarction, personal history of hypertension, CHF, end-stage renal disease, smoking EXAM: BILATERAL CAROTID DUPLEX ULTRASOUND TECHNIQUE: Pearline Cables scale imaging, color Doppler and duplex ultrasound were performed of bilateral carotid and vertebral arteries in the neck. COMPARISON:  None FINDINGS: Criteria: Quantification of carotid stenosis is based on velocity parameters that correlate the residual internal carotid diameter with NASCET-based stenosis levels, using the diameter of the distal internal carotid lumen as the denominator for stenosis  measurement. The following velocity  measurements were obtained: RIGHT ICA:  167/43 cm/sec CCA:  0000000 cm/sec SYSTOLIC ICA/CCA RATIO:  [id number] DIASTOLIC ICA/CCA RATIO:  [id number] ECA:  77 cm/sec LEFT ICA:  120/32 cm/sec CCA:  [id number] cm/sec SYSTOLIC ICA/CCA RATIO:  [id number] DIASTOLIC ICA/CCA RATIO:  [id number] ECA:  144 cm/sec RIGHT CAROTID ARTERY: Intimal thickening RIGHT CCA. Small echogenic non shadowing plaque RIGHT carotid bulb. Additional shadowing plaque at distal carotid bulb. Laminar flow on color Doppler imaging. Spectral broadening RIGHT ICA on waveform analysis. No discrete high velocity jet. Mildly tortuous RIGHT carotid system. RIGHT VERTEBRAL ARTERY:  Patent, antegrade LEFT CAROTID ARTERY: Mildly tortuous LEFT carotid system. Intimal thickening LEFT CCA. Echogenic shadowing and non shadowing plaque LEFT carotid bulb extending in the proximal LEFT ICA. Turbulent flow on color Doppler imaging within LEFT ICA. Spectral broadening LEFT ICA on waveform analysis. No high velocity jets. LEFT VERTEBRAL ARTERY:  Patent, antegrade IMPRESSION: Plaque formation at the carotid bifurcations bilaterally associated with turbulent flow at the proximal LEFT ICA. Velocity measurements correspond to a 50-69% diameter stenosis of the RIGHT carotid and a less than 50% diameter stenosis of the LEFT carotid. Electronically Signed   By: Lavonia Dana M.D.   On: 03/26/2015 15:20   Dg Chest Port 1 View  04/12/2015  CLINICAL DATA:  Vomiting and diarrhea since last night after dialysis. Left-sided weakness 2 weeks. EXAM: PORTABLE CHEST 1 VIEW COMPARISON:  11/26/2013 FINDINGS: Lungs are adequately inflated with hazy prominence of the perihilar markings suggesting mild vascular congestion. There is moderate stable cardiomegaly. No significant effusion. Moderate calcified plaque over the thoracoabdominal aorta. Remainder of the exam is unchanged. IMPRESSION: Moderate stable cardiomegaly with evidence of mild vascular congestion. Electronically Signed   By: Marin Olp M.D.   On:  04/12/2015 11:26     Assessment and Recommendation  69 y.o. 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 Hold all anticoagulation at this time until further evaluation and treatment options of primary source of bleeding 2. Continue heart rate control of atrial fibrillation and treatment of diastolic dysfunction heart failure with carvedilol 3. Patient proceed to upper endoscopy and other procedures to assess for causes of bleeding 4. Further cardiac diagnostics necessary at this time 5. Rehabilitation and other treatment options to perform without restriction  Signed, Serafina Royals M.D. FACC

## 2015-04-14 NOTE — Progress Notes (Signed)
[facility]  ROUNDING NOTE   Subjective:  Patient had hemodialysis yesterday. She tolerated this well. She normally dialyzes on Tuesday, Thursday, Saturday.   Objective:  Vital signs in last 24 hours:  Temp:  [97.6 F (36.4 C)-98.2 F (36.8 C)] 97.6 F (36.4 C) (11/28 2053) Pulse Rate:  [48-81] 61 (11/28 2053) Resp:  [14-20] 20 (11/28 2053) BP: (118-145)/(47-72) 133/58 mmHg (11/28 2053) SpO2:  [92 %-100 %] 96 % (11/28 2053) Weight:  [66.2 kg (145 lb 15.1 oz)-66.5 kg (146 lb 9.7 oz)] 66.2 kg (145 lb 15.1 oz) (11/28 1322)  Weight change: -1.54 kg (-3 lb 6.3 oz) Filed Weights   04/12/15 1012 04/13/15 0955 04/13/15 1322  Weight: 68.04 kg (150 lb) 66.5 kg (146 lb 9.7 oz) 66.2 kg (145 lb 15.1 oz)    Intake/Output: I/O last 3 completed shifts: In: 3287 [P.O.:760; I.V.:1807; Blood:720] Out: 200 [Other:200]   Intake/Output this shift:     Physical Exam: General: NAD   Head: Normocephalic, atraumatic. Moist oral mucosal membranes  Eyes: Anicteric  Neck: Supple, trachea midline  Lungs:  Clear to auscultation normal effort  Heart: Irregular no rubs  Abdomen:  Soft, nontender, BS present   Extremities: trace peripheral edema.  Neurologic: Left facial droop  Skin: No lesions  Access: Left arm AVF    Basic Metabolic Panel:  Recent Labs Lab 04/12/15 1019 04/13/15 0028 04/13/15 1048  NA 140 139  --   K 4.6 5.4*  --   CL 102 104  --   CO2 24 24  --   GLUCOSE 104* 92  --   BUN 99* 111*  --   CREATININE 5.68* 6.28*  --   CALCIUM 9.2 8.5*  --   PHOS  --   --  5.6*    Liver Function Tests:  Recent Labs Lab 04/12/15 1019  AST 20  ALT 16  ALKPHOS 278*  BILITOT 0.3  PROT 6.3*  ALBUMIN 3.0*    Recent Labs Lab 04/12/15 1019  LIPASE 22   No results for input(s): AMMONIA in the last 168 hours.  CBC:  Recent Labs Lab 04/12/15 1019 04/12/15 1351 04/13/15 0028 04/14/15 0441  WBC 7.7  --  8.1  --   HGB 5.3* 4.6* 6.8* 6.7*  HCT 17.4* 14.7*  20.9*  --   MCV 93.3  --  89.4  --   PLT 126*  --  108*  --     Cardiac Enzymes:  Recent Labs Lab 04/12/15 1019  TROPONINI 0.17*    BNP: Invalid input(s): POCBNP  CBG: No results for input(s): GLUCAP in the last 168 hours.  Microbiology: Results for orders placed or performed during the hospital encounter of 04/12/15  MRSA PCR Screening     Status: None   Collection Time: 04/14/15  5:52 AM  Result Value Ref Range Status   MRSA by PCR NEGATIVE NEGATIVE Final    Comment:        The GeneXpert MRSA Assay (FDA approved for NASAL specimens only), is one component of a comprehensive MRSA colonization surveillance program. It is not intended to diagnose MRSA infection nor to guide or monitor treatment for MRSA infections.     Coagulation Studies:  Recent Labs  04/12/15 1019 04/13/15 0919  LABPROT 89.7* 18.2*  INR 12.54* 1.50    Urinalysis: No results for input(s): COLORURINE, LABSPEC, PHURINE, GLUCOSEU, HGBUR, BILIRUBINUR, KETONESUR, PROTEINUR, UROBILINOGEN, NITRITE, LEUKOCYTESUR in the last 72 hours.  Invalid input(s): APPERANCEUR    Imaging: Ct Abdomen  Pelvis Wo Contrast  04/12/2015  CLINICAL DATA:  Gastrointestinal hemorrhage. Anemia. Significant drop in hemoglobin. Supratherapeutic INR. EXAM: CT ABDOMEN AND PELVIS WITHOUT CONTRAST TECHNIQUE: Multidetector CT imaging of the abdomen and pelvis was performed following the standard protocol without IV contrast. COMPARISON:  None. FINDINGS: Lower chest: Small bilateral pleural effusions and dependent atelectasis are present. A small pericardial effusion is present. The heart is enlarged. Coronary artery calcifications present. Hepatobiliary: No focal hepatic lesions are present. The gallbladder and common bile duct are within normal limits. Arterial calcifications are present. Pancreas: Within normal limits. Spleen: Scattered calcifications suggest prior granulomatous disease. Adrenals/Urinary Tract: The adrenal glands  are within normal limits bilaterally. The kidneys are markedly atrophic and calcified. The ureters are unremarkable. The urinary bladder is mostly collapsed. Stomach/Bowel: Diverticular changes are present in the sigmoid colon without focal inflammation to suggest diverticulitis. The more proximal colon is unremarkable. The appendix is visualized and normal. The stomach and duodenum are unremarkable. Small bowel is within normal limits. Vascular/Lymphatic: Extensive vascular calcifications are present. There is no aneurysm. Sub cm para-aortic and mesenteric lymph nodes are present. Reproductive: Hysterectomy is noted. The adnexa are within normal limits for age. Other: Diffuse mesenteric edema is present. Subcutaneous edema is present as well. There is layering low-density, nonhemorrhagic, fluid within the anatomic pelvis. A ventral abdominal hernia measures 20 mm. There is high density fat which may suggest ischemia of the fat. There is no associated bowel. Musculoskeletal: Marrow changes of end-stage renal disease are noted. Vertebral body heights and alignment are maintained. IMPRESSION: 1. Diffuse mesenteric edema and layering fluid within the anatomic pelvis. 2. No focal intraperitoneal or retroperitoneal hemorrhage. 3. No focal colonic lesion. 4. Extensive atherosclerotic disease. 5. Chronic bilateral renal atrophy. 6. Small bilateral pleural effusions and pericardial effusion. 7. Cardiomegaly without failure. Electronically Signed   By: San Morelle M.D.   On: 04/12/2015 19:11   Dg Chest Port 1 View  04/12/2015  CLINICAL DATA:  Vomiting and diarrhea since last night after dialysis. Left-sided weakness 2 weeks. EXAM: PORTABLE CHEST 1 VIEW COMPARISON:  11/26/2013 FINDINGS: Lungs are adequately inflated with hazy prominence of the perihilar markings suggesting mild vascular congestion. There is moderate stable cardiomegaly. No significant effusion. Moderate calcified plaque over the thoracoabdominal  aorta. Remainder of the exam is unchanged. IMPRESSION: Moderate stable cardiomegaly with evidence of mild vascular congestion. Electronically Signed   By: Marin Olp M.D.   On: 04/12/2015 11:26     Medications:   . sodium chloride 50 mL/hr at 04/13/15 2126   . atorvastatin  40 mg Oral q1800  . calcium acetate (Phos Binder)  1,334 mg Oral TID WC  . carvedilol  6.25 mg Oral BID WC  . pantoprazole (PROTONIX) IV  40 mg Intravenous Q12H  . sodium chloride  3 mL Intravenous Q12H   sodium chloride, sodium chloride, acetaminophen **OR** acetaminophen, alteplase, alum & mag hydroxide-simeth, heparin, HYDROcodone-acetaminophen, lidocaine (PF), lidocaine-prilocaine, ondansetron **OR** ondansetron (ZOFRAN) IV, pentafluoroprop-tetrafluoroeth, polyethylene glycol, senna-docusate  Assessment/ Plan:  [name] is a 69 y.o. black female with hypertension, GERD, ESRD, anemia of CKD, SHPTH, right MCA distribution CVA 11/16  [facility]  1. ESRD on HD TTHS:  Patient had dialysis yesterday. She is normally due for dialysis on Tuesday, Thursday, Saturday.  There is possibility that she will undergo endoscopy today. Therefore we will hold off on dialysis today and plan for dialysis again tomorrow. We will get her back on her normal schedule by Thursday.  2. Acute GI bleed.   INR was significantly high upon presentation.  Currently INR down to 1.50.  Further workup per gastroenterology.  3. Anemia of chronic kidney disease: Hemoglobin currently 6.7. Patient has been provided PRBC transfusion this admission. Continue to monitor CBC.   4. Secondary Hyperparathyroidism: PTH very high as an outpatient and patient unable to tolerate Sensipar. -Phosphorous was 5.6 yesterday and acceptable. Continue the patient on PhosLo.  5. Hypertension: Patient off of many of her antihypertensives. Continue Coreg for now.   LOS: 2 Radin Raptis 11/29/20167:35 AM

## 2015-04-14 NOTE — Progress Notes (Signed)
Patient refused to come down for dialysis treatment today.Patient is on Tuesday,Thursday,Saturday schedule as outpatient,but received dialysis yesterday due to hyperkalemia.Patient stated,"I can't do dialysis back to back it makes me too weak,I just had it yesterday and I will do dialysis again Thursday.".Patient was educated that she was to receive a blood transfusion during her dialysis treatment for fluid removal,patient verbalized understanding and continued to refuse dialysis treatment for today.Dr.Lateef was made aware by this Probation officer.

## 2015-04-14 NOTE — Progress Notes (Signed)
Called tele clerk to place pt back on monitor. Currently NSR 86.

## 2015-04-14 NOTE — Progress Notes (Signed)
Spoke with patient and daughter [name]. [phone] patient is open to [place] will continue at discharge. Lives with daughter. PT requested DME wheelchair and bedside commode telephone orders received from Dr Posey Pronto. No other CM needs identified.

## 2015-04-14 NOTE — Progress Notes (Signed)
Dr. Jannifer Franklin notified of pt c/o pain in left knee but pt is unable to receive PO pain meds d/t being NPO for EGD today.  MD stated that he will change NPO order to NPO except sips with meds so pt can receive norco

## 2015-04-14 NOTE — Care Management Important Message (Signed)
Important Message  Patient Details  Name: Toni Carlson MRN: [id number] Date of Birth: Mar 31, 1946   Medicare Important Message Given:  Yes    Juliann Pulse A Taelar Gronewold 04/14/2015, 10:01 AM

## 2015-04-14 NOTE — Transfer of Care (Signed)
Immediate Anesthesia Transfer of Care Note  Patient: Toni Carlson  Procedure(s) Performed: Procedure(s): ESOPHAGOGASTRODUODENOSCOPY (EGD) WITH PROPOFOL (N/A)  Patient Location: PACU and Endoscopy Unit  Anesthesia Type:General  Level of Consciousness: sedated  Airway & Oxygen Therapy: Patient Spontanous Breathing and Patient connected to nasal cannula oxygen  Post-op Assessment: Report given to RN and Post -op Vital signs reviewed and stable  Post vital signs: Reviewed and stable  Last Vitals: 12:05 70 hr 99% sat 96.9 109/43 16 resp  Filed Vitals:   04/14/15 0426 04/14/15 0746  BP: 133/60 102/54  Pulse: 65 66  Temp: 36.4 C 36.4 C  Resp: 18 14    Complications: No apparent anesthesia complications

## 2015-04-15 ENCOUNTER — Encounter: Payer: Self-pay | Admitting: Gastroenterology

## 2015-04-15 LAB — TYPE AND SCREEN
ABO/RH(D): A POS
Antibody Screen: NEGATIVE
UNIT DIVISION: 0
Unit division: 0
Unit division: 0

## 2015-04-15 NOTE — Progress Notes (Signed)
Pre-hd tx 

## 2015-04-15 NOTE — Progress Notes (Signed)
HD tx ended 

## 2015-04-15 NOTE — Discharge Summary (Signed)
[facility] NAME: Toni Carlson    MR#:  [id number]  DATE OF BIRTH:  [birth year]  DATE OF ADMISSION:  04/12/2015 ADMITTING PHYSICIAN: Bettey Costa, MD  DATE OF DISCHARGE: 11/301/6  PRIMARY CARE PHYSICIAN: Murlean Iba, MD    ADMISSION DIAGNOSIS:  Acute GI bleeding [K92.2]  DISCHARGE DIAGNOSIS:  GI bleed due to small gastric ulcer with supratherapeutic INR with pt being on coumadin Acute post-hemorrahgic anemia s/p 3 unit BT ESRD on HD  SECONDARY DIAGNOSIS:   Past Medical History  Diagnosis Date  . Hypertension   . ESRD (end stage renal disease) ([place])     On Tue-Thur-Sat dialysis  . Chronic diastolic CHF (congestive heart failure) ([place])     a. echo 03/2015: EF 60-65%, normal wall motion, diastolic parameters were c/w restrictive physiology, indicative of decreased LV diastolic compliance and/or increased LA pressure, mild AS, mild MR, left atrium was severely dilated, RA was severely dilated, PASP was severely increased at 90 mm Hg  . Anemia   . Hyperparathyroidism, secondary renal ([place])   . Coronary artery disease, non-occlusive     a. cardiac cath 2013  . H/O ischemic right MCA stroke     a. 03/2015; b. residual left-sided facial droop and slurred speech  . A-fib ([facility])     a. on warfarin  . Pulmonary HTN ([place])     as above  . Symptomatic bradycardia     a. history of symptomatic bradycardia; b. felt to be 2/2 metabolic abnormalities & required temp wire but no PPM, resolved with HD  . Carotid artery stenosis     a. ultrasound 03/2015: RICA 0000000 stenosis, LICA less than [id number] stenosis    HOSPITAL COURSE:   69 year old female with recent embolic stroke discharged from our hospital on November 16 2 presents with dark colored stools, acute on chronic anemia and elevated INR.  1. GI bleed: Patient presents with dark colored stools, acute on chronic anemia with acute blood loss and an elevated INR. The likely reason is  Coumadin. Coumadin is been discontinued. - She will require EGD at some point.  -s/p 3 units of PRBCs transfusion. -Continue to monitor hemoglobin every 12 hours. - Continue Protonix 40 IV twice a day. -5.3--->4.6--->2 units PRBC-->6.8-->1 unit PRBC today--7.4 -EGD showed small non-bleeding gastric ulcer  2. Acute blood loss anemia: Secondary to supratherapeutic INR. Blood transfusion as mentioned above. Continue monitor hemoglobin. -INR 12.3--->vi K --->1.5 -pt will stay off coumadin for now  3. End-stage renal disease on hemodialysis: Nephrology has been consultative. Patient has dialysis Tuesday, Thursday and Saturday -pt to get HD done today  4. Recent embolic stroke: Due to problem #1. Coumadin has been stopped. Pt has chronic neuro deficits  5. Atrial fibrillation: Coumadin needs to be stopped due to problem #1.  Cardiology input noted - Continue Coreg for heart rate control.  6. Essential hypertension: -will continue Coreg and losartan   Spoke with dter [name] [phone] y'day D/c to home with [facility] CONSULTS OBTAINED:  Treatment Team:  Leotis Pain, MD Lucilla Lame, MD Munsoor Holley Raring, MD Corey Skains, MD  DRUG ALLERGIES:  No Known Allergies  DISCHARGE MEDICATIONS:   Current Discharge Medication List    CONTINUE these medications which have NOT CHANGED   Details  amLODipine (NORVASC) 10 MG tablet Take 10 mg by mouth daily.    atorvastatin (LIPITOR) 40 MG tablet Take 1 tablet (40 mg total) by mouth daily at 6 PM. Qty: 30  tablet, Refills: 6    carvedilol (COREG) 6.25 MG tablet Take 1 tablet (6.25 mg total) by mouth 2 (two) times daily with a meal. Qty: 60 tablet, Refills: 6    feeding supplement, ENSURE COMPLETE, (ENSURE COMPLETE) LIQD Take 237 mLs by mouth 3 (three) times daily with meals. Qty: 90 Bottle, Refills: 6    hydrALAZINE (APRESOLINE) 25 MG tablet Take 25 mg by mouth 3 (three) times daily.     losartan (COZAAR) 100 MG tablet Take 100 mg  by mouth daily.    NEXIUM 40 MG capsule Take 40 mg by mouth daily at 12 noon.     PHOSLYRA 667 MG/5ML SOLN Take 1,334 mg by mouth 3 (three) times daily with meals.     polyethylene glycol (MIRALAX / GLYCOLAX) packet Take 17 g by mouth daily as needed for mild constipation. Qty: 14 each, Refills: 0    trolamine salicylate (ASPERCREME) 10 % cream Apply topically 2 (two) times daily as needed for muscle pain. Qty: 85 g, Refills: 0      STOP taking these medications     warfarin (COUMADIN) 7.5 MG tablet         If you experience worsening of your admission symptoms, develop shortness of breath, life threatening emergency, suicidal or homicidal thoughts you must seek medical attention immediately by calling 911 or calling your MD immediately  if symptoms less severe.  You Must read complete instructions/literature along with all the possible adverse reactions/side effects for all the Medicines you take and that have been prescribed to you. Take any new Medicines after you have completely understood and accept all the possible adverse reactions/side effects.   Please note  You were cared for by a hospitalist during your hospital stay. If you have any questions about your discharge medications or the care you received while you were in the hospital after you are discharged, you can call the unit and asked to speak with the hospitalist on call if the hospitalist that took care of you is not available. Once you are discharged, your primary care physician will handle any further medical issues. Please note that NO REFILLS for any discharge medications will be authorized once you are discharged, as it is imperative that you return to your primary care physician (or establish a relationship with a primary care physician if you do not have one) for your aftercare needs so that they can reassess your need for medications and monitor your lab values. Today   SUBJECTIVE   Feeling ok. Wants to go  home  VITAL SIGNS:  Blood pressure 128/55, pulse 69, temperature 97.4 F (36.3 C), temperature source Oral, resp. rate 18, height 5\' 6"  (1.676 m), weight 145 lb 15.1 oz (66.2 kg), SpO2 100 %.  I/O:    Intake/Output Summary (Last 24 hours) at 04/15/15 0943 Last data filed at 04/15/15 0851  Gross per 24 hour  Intake   1610 ml  Output      0 ml  Net   1610 ml    PHYSICAL EXAMINATION:  GENERAL:  69 y.o.-year-old patient lying in the bed with no acute distress.  EYES: Pupils equal, round, reactive to light and accommodation. No scleral icterus. Extraocular muscles intact.  HEENT: Head atraumatic, normocephalic. Oropharynx and nasopharynx clear.  NECK:  Supple, no jugular venous distention. No thyroid enlargement, no tenderness.  LUNGS: Normal breath sounds bilaterally, no wheezing, rales,rhonchi or crepitation. No use of accessory muscles of respiration.  CARDIOVASCULAR: S1, S2 normal. No murmurs, rubs,  or gallops.  ABDOMEN: Soft, non-tender, non-distended. Bowel sounds present. No organomegaly or mass.  EXTREMITIES: No pedal edema, cyanosis, or clubbing.  NEUROLOGIC:left facial drool,chornic and left sided weakness PSYCHIATRIC: patient is alert and oriented x 3.  SKIN: No obvious rash, lesion, or ulcer.   DATA REVIEW:   CBC   Recent Labs Lab 04/13/15 0028  04/14/15 1955  WBC 8.1  --   --   HGB 6.8*  < > 7.4*  HCT 20.9*  --   --   PLT 108*  --   --   < > = values in this interval not displayed.  Chemistries   Recent Labs Lab 04/12/15 1019 04/13/15 0028 04/14/15 0906  NA 140 139  --   K 4.6 5.4* 4.0  CL 102 104  --   CO2 24 24  --   GLUCOSE 104* 92  --   BUN 99* 111*  --   CREATININE 5.68* 6.28*  --   CALCIUM 9.2 8.5*  --   AST 20  --   --   ALT 16  --   --   ALKPHOS 278*  --   --   BILITOT 0.3  --   --     Microbiology Results   Recent Results (from the past 240 hour(s))  MRSA PCR Screening     Status: None   Collection Time: 04/14/15  5:52 AM  Result  Value Ref Range Status   MRSA by PCR NEGATIVE NEGATIVE Final    Comment:        The GeneXpert MRSA Assay (FDA approved for NASAL specimens only), is one component of a comprehensive MRSA colonization surveillance program. It is not intended to diagnose MRSA infection nor to guide or monitor treatment for MRSA infections.     RADIOLOGY:  No results found.   Management plans discussed with the patient, family and they are in agreement.  CODE STATUS:     Code Status Orders        Start     Ordered   04/12/15 1304  Do not attempt resuscitation (DNR)   Continuous    Question Answer Comment  In the event of cardiac or respiratory ARREST Do not call a "code blue"   In the event of cardiac or respiratory ARREST Do not perform Intubation, CPR, defibrillation or ACLS   In the event of cardiac or respiratory ARREST Use medication by any route, position, wound care, and other measures to relive pain and suffering. May use oxygen, suction and manual treatment of airway obstruction as needed for comfort.      04/12/15 1303      TOTAL TIME TAKING CARE OF THIS PATIENT: 40 minutes.    Seydou Hearns M.D on 04/15/2015 at 9:43 AM  Between 7am to 6pm - Pager - [phone] After 6pm go to [email or web address] - password [email or web address] [facility]  Office  [phone]  CC: Primary care physician; Murlean Iba, MD

## 2015-04-15 NOTE — Anesthesia Postprocedure Evaluation (Signed)
Anesthesia Post Note  Patient: Amyria Schneiter Rzasa  Procedure(s) Performed: Procedure(s) (LRB): ESOPHAGOGASTRODUODENOSCOPY (EGD) WITH PROPOFOL (N/A)  Patient location during evaluation: PACU Anesthesia Type: General Level of consciousness: awake and alert Pain management: pain level controlled Vital Signs Assessment: post-procedure vital signs reviewed and stable Respiratory status: spontaneous breathing, nonlabored ventilation, respiratory function stable and patient connected to nasal cannula oxygen Cardiovascular status: blood pressure returned to baseline and stable Postop Assessment: no signs of nausea or vomiting Anesthetic complications: no    Last Vitals:  Filed Vitals:   04/15/15 1030 04/15/15 1100  BP: 140/67 138/50  Pulse: 55 85  Temp:    Resp: 17 13    Last Pain:  Filed Vitals:   04/15/15 1114  PainSc: 0-No pain    LLE Motor Response: Purposeful movement   RLE Motor Response: Purposeful movement        Molli Barrows

## 2015-04-15 NOTE — Progress Notes (Signed)
HD tx start 

## 2015-04-15 NOTE — Discharge Instructions (Signed)
Resume your HD as before    Gastrointestinal Bleeding Gastrointestinal bleeding is bleeding somewhere along the path that food travels through the body (digestive tract). This path is anywhere between the mouth and the opening of the butt (anus). You may have blood in your throw up (vomit) or in your poop (stools). If there is a lot of bleeding, you may need to stay in the hospital. [place]  Only take medicine as told by your doctor.  Eat foods with fiber such as whole grains, fruits, and vegetables. You can also try eating 1 to 3 prunes a day.  Drink enough fluids to keep your pee (urine) clear or pale yellow. GET HELP RIGHT AWAY IF:   Your bleeding gets worse.  You feel dizzy, weak, or you pass out (faint).  You have bad cramps in your back or belly (abdomen).  You have large blood clumps (clots) in your poop.  Your problems are getting worse. MAKE SURE YOU:   Understand these instructions.  Will watch your condition.  Will get help right away if you are not doing well or get worse.   This information is not intended to replace advice given to you by your health care provider. Make sure you discuss any questions you have with your health care provider.   Document Released: 02/09/2008 Document Revised: 04/18/2012 Document Reviewed: 10/20/2014 Elsevier Interactive Patient Education [employer].

## 2015-04-15 NOTE — Care Management (Signed)
Notified Toni Carlson at [facility] of impending discharge today.

## 2015-04-15 NOTE — Progress Notes (Signed)
[name] [facility] [place] NAME: Toni Carlson   MR#: [id number]  DATE OF BIRTH: 21-Jan-1946  SUBJECTIVE:   Doing well. No complaints REVIEW OF SYSTEMS:  Review of Systems  Constitutional: Negative for fever, chills and weight loss.  HENT: Negative for ear discharge, ear pain and nosebleeds.  Eyes: Negative for blurred vision, pain and discharge.  Respiratory: Negative for sputum production, shortness of breath, wheezing and stridor.  Cardiovascular: Negative for chest pain, palpitations, orthopnea and PND.  Gastrointestinal: Negative for nausea, vomiting, abdominal pain and diarrhea.  Genitourinary: Negative for urgency and frequency.  Musculoskeletal: Negative for back pain and joint pain.  Neurological: Negative for sensory change, speech change, focal weakness and weakness.  Psychiatric/Behavioral: Negative for depression and hallucinations. The patient is not nervous/anxious.   Tolerating Diet:yes  DRUG ALLERGIES:  No Known Allergies  VITALS:  Blood pressure 127/48, pulse 67, temperature 96.5 F (35.8 C), temperature source Axillary, resp. rate 19, height 5\' 6"  (1.676 m), weight 145 lb 15.1 oz (66.2 kg), SpO2 92 %.  PHYSICAL EXAMINATION:   Physical Exam  GENERAL: 69 y.o.-year-old patient lying in the bed with no acute distress.  EYES: Pupils equal, round, reactive to light and accommodation. No scleral icterus. Extraocular muscles intact.  HEENT: Head atraumatic, normocephalic. Oropharynx and nasopharynx clear.  NECK: Supple, no jugular venous distention. No thyroid enlargement, no tenderness.  LUNGS: Normal breath sounds bilaterally, no wheezing, rales, rhonchi. No use of accessory muscles of respiration.  CARDIOVASCULAR: S1, S2 normal. No murmurs, rubs, or gallops.  ABDOMEN: Soft, nontender, nondistended. Bowel sounds present. No organomegaly or mass.  EXTREMITIES: No cyanosis, clubbing or edema  b/l.  NEUROLOGIC:left facial droop and wekaness, chronic weakness due to stroke PSYCHIATRIC: The patient is alert and oriented x 3.  SKIN: No obvious rash, lesion, or ulcer.    LABORATORY PANEL:   CBC  Last Labs      Recent Labs Lab 04/13/15 0028 04/14/15 0441  WBC 8.1 --   HGB 6.8* 6.7*  HCT 20.9* --   PLT 108* --       Chemistries   Last Labs      Recent Labs Lab 04/12/15 1019 04/13/15 0028 04/14/15 0906  NA 140 139 --   K 4.6 5.4* 4.0  CL 102 104 --   CO2 24 24 --   GLUCOSE 104* 92 --   BUN 99* 111* --   CREATININE 5.68* 6.28* --   CALCIUM 9.2 8.5* --   AST 20 --  --   ALT 16 --  --   ALKPHOS 278* --  --   BILITOT 0.3 --  --       Cardiac Enzymes  Last Labs      Recent Labs Lab 04/12/15 1019  TROPONINI 0.17*      RADIOLOGY:   Imaging Results (Last 48 hours)    Ct Abdomen Pelvis Wo Contrast  04/12/2015 CLINICAL DATA: Gastrointestinal hemorrhage. Anemia. Significant drop in hemoglobin. Supratherapeutic INR. EXAM: CT ABDOMEN AND PELVIS WITHOUT CONTRAST TECHNIQUE: Multidetector CT imaging of the abdomen and pelvis was performed following the standard protocol without IV contrast. COMPARISON: None. FINDINGS: Lower chest: Small bilateral pleural effusions and dependent atelectasis are present. A small pericardial effusion is present. The heart is enlarged. Coronary artery calcifications present. Hepatobiliary: No focal hepatic lesions are present. The gallbladder and common bile duct are within normal limits. Arterial calcifications are present. Pancreas: Within normal limits. Spleen: Scattered calcifications suggest prior granulomatous disease.  Adrenals/Urinary Tract: The adrenal glands are within normal limits bilaterally. The kidneys are markedly atrophic and calcified. The ureters are unremarkable. The urinary bladder is mostly collapsed. Stomach/Bowel:  Diverticular changes are present in the sigmoid colon without focal inflammation to suggest diverticulitis. The more proximal colon is unremarkable. The appendix is visualized and normal. The stomach and duodenum are unremarkable. Small bowel is within normal limits. Vascular/Lymphatic: Extensive vascular calcifications are present. There is no aneurysm. Sub cm para-aortic and mesenteric lymph nodes are present. Reproductive: Hysterectomy is noted. The adnexa are within normal limits for age. Other: Diffuse mesenteric edema is present. Subcutaneous edema is present as well. There is layering low-density, nonhemorrhagic, fluid within the anatomic pelvis. A ventral abdominal hernia measures 20 mm. There is high density fat which may suggest ischemia of the fat. There is no associated bowel. Musculoskeletal: Marrow changes of end-stage renal disease are noted. Vertebral body heights and alignment are maintained. IMPRESSION: 1. Diffuse mesenteric edema and layering fluid within the anatomic pelvis. 2. No focal intraperitoneal or retroperitoneal hemorrhage. 3. No focal colonic lesion. 4. Extensive atherosclerotic disease. 5. Chronic bilateral renal atrophy. 6. Small bilateral pleural effusions and pericardial effusion. 7. Cardiomegaly without failure. Electronically Signed By: San Morelle M.D. On: 04/12/2015 19:11      ASSESSMENT AND PLAN:   69 year old female with recent embolic stroke discharged from our hospital on November 16 2 presents with dark colored stools, acute on chronic anemia and elevated INR.  1. GI bleed: Patient presents with dark colored stools, acute on chronic anemia with acute blood loss and an elevated INR. The likely reason is Coumadin. Coumadin is been discontinued. - She will require EGD at some point.  -s/p 2 units of PRBCs transfusion. -Continue to monitor hemoglobin every 12 hours. - Continue Protonix 40 IV twice a day. -5.3--->4.6--->2 units PRBC-->6.8-->1  unit PRBC today -sEGD showed small non-bleeding gastric ulcer  2. Acute blood loss anemia: Secondary to supratherapeutic INR. Blood transfusion as mentioned above. Continue monitor hemoglobin. -INR 12.3--->vi K --->1.5 -pt will stay off coumadin for now  3. End-stage renal disease on hemodialysis: Nephrology has been consultative. Patient has dialysis Tuesday, Thursday and Saturday -elevated K.got HD y'day. Pt declines To do HD today(her routine day)  4. Recent embolic stroke: Due to problem #1. Coumadin has been stopped. Pt has chronic neuro deficits  5. Atrial fibrillation: Coumadin needs to be stopped due to problem #1.  Cardiology input noted - Continue Coreg for heart rate control.  6. Essential hypertension: -will continue Coreg and losartan   Will d/c tomorrow. Spoke with dter [name] [phone]  Case discussed with Care Management/Social Worker. Management plans discussed with the patient, family and they are in agreement.  CODE STATUS: full  DVT Prophylaxis: SCD/TED  TOTAL TIME TAKING CARE OF THIS PATIENT: 30 minutes.  >50% time spent on counselling and coordination of care  POSSIBLE D/C IN AM DAYS DEPENDING ON CLINICAL CONDITION.   Royalti Schauf M.D on 04/14/2015  Between 7am to 6pm - Pager - [phone]  After 6pm go to [email or web address] - password [email or web address] [facility]  Office [phone]  CC: Primary care physician; Murlean Iba, MD

## 2015-04-15 NOTE — Progress Notes (Signed)
MD ordered patient to be discharged today after she had dialysis.  Patient tolerated her dialysis well.  Discharge instructions were reviewed with the patient and daughter and they both voiced understanding.  No prescriptions given. No follow-up appointment scheduled.  IVs were removed.  All questions were answered.  Patient went down in a wheelchair escorted by me.

## 2015-04-15 NOTE — Progress Notes (Signed)
[facility]  ROUNDING NOTE   Subjective:  Patient seen and evaluated during dialysis today. Ultrafiltration target 1 kg. Blood flow rate currently 400.  Objective:  Vital signs in last 24 hours:  Temp:  [96.5 F (35.8 C)-98 F (36.7 C)] 97.7 F (36.5 C) (11/30 1015) Pulse Rate:  [55-80] 55 (11/30 1030) Resp:  [16-19] 17 (11/30 1030) BP: (101-140)/(43-72) 140/67 mmHg (11/30 1030) SpO2:  [92 %-100 %] 100 % (11/30 1030) Weight:  [68 kg (149 lb 14.6 oz)] 68 kg (149 lb 14.6 oz) (11/30 1015)  Weight change:  Filed Weights   04/13/15 0955 04/13/15 1322 04/15/15 1015  Weight: 66.5 kg (146 lb 9.7 oz) 66.2 kg (145 lb 15.1 oz) 68 kg (149 lb 14.6 oz)    Intake/Output: I/O last 3 completed shifts: In: 1963.6 [P.O.:720; I.V.:1243.6] Out: 0    Intake/Output this shift:  Total I/O In: 294 [P.O.:120; I.V.:174] Out: 0   Physical Exam: General: NAD   Head: Normocephalic, atraumatic. Moist oral mucosal membranes  Eyes: Anicteric  Neck: Supple, trachea midline  Lungs:  Clear to auscultation normal effort  Heart: Irregular no rubs  Abdomen:  Soft, nontender, BS present   Extremities: trace peripheral edema.  Neurologic: Left facial droop  Skin: No lesions  Access: Left arm AVF    Basic Metabolic Panel:  Recent Labs Lab 04/12/15 1019 04/13/15 0028 04/13/15 1048 04/14/15 0906  NA 140 139  --   --   K 4.6 5.4*  --  4.0  CL 102 104  --   --   CO2 24 24  --   --   GLUCOSE 104* 92  --   --   BUN 99* 111*  --   --   CREATININE 5.68* 6.28*  --   --   CALCIUM 9.2 8.5*  --   --   PHOS  --   --  5.6*  --     Liver Function Tests:  Recent Labs Lab 04/12/15 1019  AST 20  ALT 16  ALKPHOS 278*  BILITOT 0.3  PROT 6.3*  ALBUMIN 3.0*    Recent Labs Lab 04/12/15 1019  LIPASE 22   No results for input(s): AMMONIA in the last 168 hours.  CBC:  Recent Labs Lab 04/12/15 1019 04/12/15 1351 04/13/15 0028 04/14/15 0441 04/14/15 1955  WBC 7.7  --  8.1   --   --   HGB 5.3* 4.6* 6.8* 6.7* 7.4*  HCT 17.4* 14.7* 20.9*  --   --   MCV 93.3  --  89.4  --   --   PLT 126*  --  108*  --   --     Cardiac Enzymes:  Recent Labs Lab 04/12/15 1019  TROPONINI 0.17*    BNP: Invalid input(s): POCBNP  CBG: No results for input(s): GLUCAP in the last 168 hours.  Microbiology: Results for orders placed or performed during the hospital encounter of 04/12/15  MRSA PCR Screening     Status: None   Collection Time: 04/14/15  5:52 AM  Result Value Ref Range Status   MRSA by PCR NEGATIVE NEGATIVE Final    Comment:        The GeneXpert MRSA Assay (FDA approved for NASAL specimens only), is one component of a comprehensive MRSA colonization surveillance program. It is not intended to diagnose MRSA infection nor to guide or monitor treatment for MRSA infections.     Coagulation Studies:  Recent Labs  04/13/15 0919  LABPROT 18.2*  INR 1.50    Urinalysis: No results for input(s): COLORURINE, LABSPEC, PHURINE, GLUCOSEU, HGBUR, BILIRUBINUR, KETONESUR, PROTEINUR, UROBILINOGEN, NITRITE, LEUKOCYTESUR in the last 72 hours.  Invalid input(s): APPERANCEUR    Imaging: No results found.   Medications:   . sodium chloride 50 mL/hr at 04/14/15 1720   . atorvastatin  40 mg Oral q1800  . calcium acetate (Phos Binder)  1,334 mg Oral TID WC  . carvedilol  6.25 mg Oral BID WC  . pantoprazole (PROTONIX) IV  40 mg Intravenous Q12H  . sodium chloride  3 mL Intravenous Q12H   sodium chloride, sodium chloride, acetaminophen **OR** acetaminophen, alteplase, alum & mag hydroxide-simeth, heparin, HYDROcodone-acetaminophen, lidocaine (PF), lidocaine-prilocaine, ondansetron **OR** ondansetron (ZOFRAN) IV, pentafluoroprop-tetrafluoroeth, polyethylene glycol, senna-docusate  Assessment/ Plan:  Toni Carlson is a 69 y.o. black female with hypertension, GERD, ESRD, anemia of CKD, SHPTH, right MCA distribution CVA 11/16  [place] [facility] [place]  1. ESRD on HD TTHS:  Patient seen and evaluated during dialysis. Currently tolerating well. Ultrafiltration target 1 kg. Resume normal outpatient dialysis schedule tomorrow.   2. Acute GI bleed.  Due to coagulopathy as well as gastric ulcer. Patient to be continued on Protonix.  3. Anemia of chronic kidney disease:  patient has received blood transfusion this admission. Continue Epogen as an outpatient.   4. Secondary Hyperparathyroidism: PTH very high as an outpatient and patient unable to tolerate Sensipar. Continue the patient on PhosLo upon discharge.  5. Hypertension: blood pressure currently well controlled at 140/67. Continue the patient on Coreg currently.    LOS: 3 Rema Lievanos 11/30/201610:40 AM

## 2015-04-17 ENCOUNTER — Emergency Department: Payer: Medicare Other

## 2015-04-17 ENCOUNTER — Emergency Department
Admission: EM | Admit: 2015-04-17 | Discharge: 2015-04-17 | Disposition: A | Discharge status: Home / Self Care | Payer: Medicare Other | Attending: Emergency Medicine | Admitting: Emergency Medicine

## 2015-04-17 ENCOUNTER — Encounter: Payer: Self-pay | Admitting: Emergency Medicine

## 2015-04-17 DIAGNOSIS — S3289XA Fracture of other parts of pelvis, initial encounter for closed fracture: Secondary | ICD-10-CM | POA: Insufficient documentation

## 2015-04-17 DIAGNOSIS — S79922A Unspecified injury of left thigh, initial encounter: Secondary | ICD-10-CM | POA: Diagnosis present

## 2015-04-17 DIAGNOSIS — M1711 Unilateral primary osteoarthritis, right knee: Secondary | ICD-10-CM | POA: Insufficient documentation

## 2015-04-17 DIAGNOSIS — W1839XA Other fall on same level, initial encounter: Secondary | ICD-10-CM | POA: Diagnosis not present

## 2015-04-17 DIAGNOSIS — Z79899 Other long term (current) drug therapy: Secondary | ICD-10-CM | POA: Diagnosis not present

## 2015-04-17 DIAGNOSIS — Y92009 Unspecified place in unspecified non-institutional (private) residence as the place of occurrence of the external cause: Secondary | ICD-10-CM | POA: Diagnosis not present

## 2015-04-17 DIAGNOSIS — Y998 Other external cause status: Secondary | ICD-10-CM | POA: Diagnosis not present

## 2015-04-17 DIAGNOSIS — Y9389 Activity, other specified: Secondary | ICD-10-CM | POA: Diagnosis not present

## 2015-04-17 DIAGNOSIS — S8992XA Unspecified injury of left lower leg, initial encounter: Secondary | ICD-10-CM | POA: Insufficient documentation

## 2015-04-17 DIAGNOSIS — M25461 Effusion, right knee: Secondary | ICD-10-CM | POA: Insufficient documentation

## 2015-04-17 DIAGNOSIS — Z87891 Personal history of nicotine dependence: Secondary | ICD-10-CM | POA: Diagnosis not present

## 2015-04-17 DIAGNOSIS — M79652 Pain in left thigh: Secondary | ICD-10-CM

## 2015-04-17 DIAGNOSIS — I10 Essential (primary) hypertension: Secondary | ICD-10-CM | POA: Insufficient documentation

## 2015-04-17 DIAGNOSIS — S329XXA Fracture of unspecified parts of lumbosacral spine and pelvis, initial encounter for closed fracture: Secondary | ICD-10-CM

## 2015-04-17 LAB — CBC WITH DIFFERENTIAL/PLATELET
BASOS PCT: 0 %
Basophils Absolute: 0 10*3/uL (ref 0–0.1)
EOS ABS: 0 10*3/uL (ref 0–0.7)
Eosinophils Relative: 0 %
HCT: 25.6 % — ABNORMAL LOW (ref 35.0–47.0)
HEMOGLOBIN: 8.1 g/dL — AB (ref 12.0–16.0)
Lymphocytes Relative: 3 %
Lymphs Abs: 0.3 10*3/uL — ABNORMAL LOW (ref 1.0–3.6)
MCH: 29.6 pg (ref 26.0–34.0)
MCHC: 31.6 g/dL — AB (ref 32.0–36.0)
MCV: 93.5 fL (ref 80.0–100.0)
MONOS PCT: 5 %
Monocytes Absolute: 0.5 10*3/uL (ref 0.2–0.9)
NEUTROS PCT: 92 %
Neutro Abs: 9.2 10*3/uL — ABNORMAL HIGH (ref 1.4–6.5)
Platelets: 130 10*3/uL — ABNORMAL LOW (ref 150–440)
RBC: 2.74 MIL/uL — ABNORMAL LOW (ref 3.80–5.20)
RDW: 21.8 % — ABNORMAL HIGH (ref 11.5–14.5)
WBC: 10 10*3/uL (ref 3.6–11.0)

## 2015-04-17 LAB — COMPREHENSIVE METABOLIC PANEL
ALK PHOS: 337 U/L — AB (ref 38–126)
ALT: 15 U/L (ref 14–54)
AST: 22 U/L (ref 15–41)
Albumin: 3.2 g/dL — ABNORMAL LOW (ref 3.5–5.0)
Anion gap: 12 (ref 5–15)
BILIRUBIN TOTAL: 0.3 mg/dL (ref 0.3–1.2)
BUN: 45 mg/dL — ABNORMAL HIGH (ref 6–20)
CALCIUM: 8.6 mg/dL — AB (ref 8.9–10.3)
CO2: 30 mmol/L (ref 22–32)
CREATININE: 6.18 mg/dL — AB (ref 0.44–1.00)
Chloride: 101 mmol/L (ref 101–111)
GFR, EST AFRICAN AMERICAN: 7 mL/min — AB (ref >60)
GFR, EST NON AFRICAN AMERICAN: 6 mL/min — AB (ref >60)
Glucose, Bld: 154 mg/dL — ABNORMAL HIGH (ref 65–99)
Potassium: 4 mmol/L (ref 3.5–5.1)
Sodium: 143 mmol/L (ref 135–145)
Total Protein: 6.2 g/dL — ABNORMAL LOW (ref 6.5–8.1)

## 2015-04-17 LAB — PROTIME-INR
INR: 1.22
PROTHROMBIN TIME: 15.6 s — AB (ref 11.4–15.0)

## 2015-04-17 LAB — TROPONIN I: Troponin I: 0.11 ng/mL — ABNORMAL HIGH (ref <0.031)

## 2015-04-17 MED ORDER — HYDROCODONE-ACETAMINOPHEN 5-325 MG PO TABS
1.0000 | ORAL_TABLET | Freq: Once | ORAL | Status: AC
  Administered 2015-04-17: 1 via ORAL
  Filled 2015-04-17: qty 1

## 2015-04-17 MED ORDER — OXYCODONE-ACETAMINOPHEN 5-325 MG PO TABS: 1.0000 | ORAL_TABLET | ORAL | Status: DC | PRN

## 2015-04-17 NOTE — Clinical Social Work Note (Signed)
Clinical Social Work Assessment  Patient Details  Name: Toni Carlson MRN: [id number] Date of Birth: Apr 14, 1946  Date of referral:  04/17/15               Reason for consult:  Facility Placement                Permission sought to share information with:  Facility Sport and exercise psychologist, Family Supports Permission granted to share information::  Yes, Verbal Permission Granted  Name::      (daughter [name]  [phone])  Agency::     Relationship::     Contact Information:     Housing/Transportation Living arrangements for the past 2 months:  Single Family Home Source of Information:  Patient, Adult Children, Medical Team Patient Interpreter Needed:  None Criminal Activity/Legal Involvement Pertinent to Current Situation/Hospitalization:  No - Comment as needed Significant Relationships:  Adult Children Lives with:  Adult Children Do you feel safe going back to the place where you live?  No (not in current state ) Need for family participation in patient care:     Care giving concerns:  Patient unable to walk, daughter can not care for patient.   Social Worker assessment / plan:  Holiday representative (CSW) consult for SNF placement.  Patient recently discharged home with home health and PT.  Patient had a fall and is now unable to walk.  Patient was alert, oriented and engaged in conversation with CSW.  (Additional Information provided by daughter [name] at bedside).  CSW introduced self and explained role of CSW department.  Patient currently lives at home with her daughter who works at night .     Patient has never been to SNF, EDP would like patient to get rehab. CSW reviewed SNF process with patient, Patient is agreeable to SNF search and prefers [facility].  CSW explained that in order for Medicare  to pay for rehab patient will need a 3 night qualifying inpatient stay in the past 30 days. Patient will meet the 3 night stay requirement from her last inpatient  stay.   CSW will complete FL2, and fax out for available bed in anticipation of discharging to SNF for rehab.   Employment status:  Disabled (Comment on whether or not currently receiving Disability) Insurance information:  Medicare, Medicaid In [place] PT Recommendations:  Not assessed at this time Information / Referral to community resources:  [place]  Patient/Family's Response to care:  Patient and daughter was appreciative of information provided by CSW.   Patient/Family's Understanding of and Emotional Response to Diagnosis, Current Treatment, and Prognosis:  Patient and daughter understand that patient will not be admitted.  CSW will attempt for SNF placement.  Emotional Assessment Appearance:  Appears stated age Attitude/Demeanor/Rapport:    Affect (typically observed):  Accepting, Adaptable, Pleasant Orientation:  Oriented to Self, Oriented to Place, Oriented to  Time, Oriented to Situation Alcohol / Substance use:  Never Used Psych involvement (Current and /or in the community):  No (Comment)  Discharge Needs  Concerns to be addressed:  Discharge Planning Concerns, Care Coordination Readmission within the last 30 days:  Yes Current discharge risk:  Chronically ill, Dependent with Mobility Barriers to Discharge:  No Barriers Identified   Maurine Cane, LCSW 04/17/2015, 6:21 PM

## 2015-04-17 NOTE — ED Notes (Signed)
Brought in via ems from home fell this am pain to left knee unable to bear full wt

## 2015-04-17 NOTE — ED Notes (Signed)
Sandwich tray given 

## 2015-04-17 NOTE — ED Provider Notes (Signed)
-----------------------------------------   8:02 PM on 04/17/2015 -----------------------------------------  Took over care for this patient. I did x-ray the patient's left hip which did show a possible pelvic fracture. No signs of any hip fracture. Patient's troponin was elevated however patient has chronic elevated troponin. No chest pain and thus I doubt it signifies ACS. Patient will be discharged to [facility] care for rehabilitation.  Nance Pear, MD 04/17/15 2003

## 2015-04-17 NOTE — Clinical Social Work Placement (Signed)
   CLINICAL SOCIAL WORK PLACEMENT  NOTE  Date:  04/17/2015  Patient Details  Name: Toni Carlson MRN: [id number] Date of Birth: 25-Mar-1946  Clinical Social Work is seeking post-discharge placement for this patient at the [name] level of care (*CSW will initial, date and re-position this form in  chart as items are completed):  Yes   Patient/family provided with [place] Work Department's list of facilities offering this level of care within the geographic area requested by the patient (or if unable, by the patient's family).  Yes   Patient/family informed of their freedom to choose among providers that offer the needed level of care, that participate in Medicare, Medicaid or managed care program needed by the patient, have an available bed and are willing to accept the patient.  Yes   Patient/family informed of [facility]'s ownership interest in [facility] and [facility], as well as of the fact that they are under no obligation to receive care at these facilities.  PASRR submitted to EDS on       PASRR number received on       Existing PASRR number confirmed on 04/17/15     FL2 transmitted to all facilities in geographic area requested by pt/family on 04/17/15     FL2 transmitted to all facilities within larger geographic area on       Patient informed that his/her managed care company has contracts with or will negotiate with certain facilities, including the following:        Yes   Patient/family informed of bed offers received.  Patient chooses bed at  [facility])     Physician recommends and patient chooses bed at      Patient to be transferred to  [facility]) on 04/17/15.  Patient to be transferred to facility by  (EMS)     Patient family notified on 04/17/15 of transfer.  Name of family member notified:   [name] ( daughter))     PHYSICIAN       Additional Comment:     _______________________________________________ Maurine Cane, LCSW 04/17/2015, 6:39 PM

## 2015-04-17 NOTE — ED Notes (Signed)
Toni Carlson, pt family member called to make aware of pt status and pt leaving [facility] now with EMS

## 2015-04-17 NOTE — Discharge Instructions (Signed)
Please seek medical attention for any high fevers, chest pain, shortness of breath, change in behavior, persistent vomiting, bloody stool or any other new or concerning symptoms.   Simple Pelvic Fracture, Adult A pelvic fracture is a break in one of the pelvic bones. The pelvic bones include the bones that you sit on and the bones that make up the lower part of your spine. A pelvic fracture is called simple if the broken bones are stable and are not moving out of place. CAUSES  Common causes of this type of fracture include:  A fall.  A car accident.  Force or pressure applied to the pelvis. RISK FACTORS You may be at higher risk for this type of fracture if:  You play high-impact sports.  You are an older person with a condition that causes weak bones (osteoporosis).  You have a bone-weakening disease. SIGNS AND SYMPTOMS Signs and symptoms may include:  Tenderness, swelling, or bruising in the affected area.  Pain when moving the hip.  Pain when walking or standing. DIAGNOSIS A diagnosis is made with a physical exam and X-rays. Sometimes, a CT scan is also done. TREATMENT The goal of treatment is to get the bones to heal in a good position. Treatment of a simple pelvic fracture usually involves staying in bed (bed rest) and using crutches or a walker until the bones heal. Medicines may be prescribed for pain. Medicines may also be prescribed that help to prevent blood clots from forming in the legs. HOME CARE INSTRUCTIONS Managing Pain, Stiffness, and Swelling  If directed, apply ice to the injured area:  Put ice in a plastic bag.  Place a towel between your skin and the bag.  Leave the ice on for 20 minutes, 2-3 times a day.  Raise the injured area above the level of your heart while you are sitting or lying down. Driving  Do not  drive or operate heavy machinery until your health care provider tells you it is safe to do. Activity  Stay on bed rest for as Thane as  directed by your health care provider.  While on bed rest:  Change the position of your legs every 1-2 hours. This keeps blood moving well through both of your legs.  You may sit for as Piggee as you feel comfortable.  After bed rest:  Avoid strenuous activities for as Cloyd as directed by your health care provider.  Return to your normal activities as directed by your health care provider. Ask your health care provider what activities are safe for you. Safety  Do not use the injured limb to support your body weight until your health care provider says that you can. Use crutches or a walker as directed by your health care provider. General Instructions  Do not use any tobacco products, including cigarettes, chewing tobacco, or electronic cigarettes. Tobacco can delay bone healing. If you need help quitting, ask your health care provider.  Take medicines only as directed by your health care provider.  Keep all follow-up visits as directed by your health care provider. This is important. SEEK MEDICAL CARE IF:  Your pain gets worse.  Your pain is not relieved with medicines. SEEK IMMEDIATE MEDICAL CARE IF:  You feel light-headed or faint.  You develop chest pain.  You develop shortness of breath.  You have a fever.  You have blood in your urine or your stools.  You have vaginal bleeding.  You have difficulty or pain with urination or with a  bowel movement.  You have difficulty or increased pain with walking.  You have new or increased swelling in one of your legs.  You have numbness in your legs or groin area.   This information is not intended to replace advice given to you by your health care provider. Make sure you discuss any questions you have with your health care provider.   Document Released: 07/11/2001 Document Revised: 05/23/2014 Document Reviewed: 12/24/2013 Elsevier Interactive Patient Education [employer].

## 2015-04-17 NOTE — ED Notes (Signed)
MD at bedside. 

## 2015-04-17 NOTE — Progress Notes (Signed)
CSW informed by EDP will sent patient for x-ray.  If patient is able to discharge.  She will discharge to [facility] to Rm 75.  RN to call report to [phone] and Fax discharge summary to [phone] fax.   Casimer Lanius. Latanya Presser, MSW Clinical Social Work Department [phone] 6:47 PM

## 2015-04-17 NOTE — ED Provider Notes (Signed)
[facility] Emergency Department Provider Note ____________________________________________  Time seen: 1505  I have reviewed the triage vital signs and the nursing notes.  HISTORY  Chief Complaint  Fall  HPI Toni Carlson is a 69 y.o. female to the ED via EMS, By her adult daughter, and primary caregiver for evaluation of injury sustained following a fall today. Patient is known to the hospital following an admission and discharge 3 days prior, following an acute GI bleed and transfusion. The patient who apparently refused transfer to a skilled nursing facility or rehabilitation at the time of discharge. The daughter describes that she fell at home this morning as she attempted to get up without using her walker. She was able to transfer the patient to the bed, where she remained throughout the day. The patient later complained of pain to the inner thigh and claimed that she could not get up. The daughter called EMS who is transferred to patient here for evaluation of injuries. She complains to me of pain to the inner left thigh and denies any acute knee pain at this time. She describes the pain will not allow her to walk and she is unable to transition or stand from the wheelchair.  Past Medical History  Diagnosis Date  . Hypertension   . ESRD (end stage renal disease) ([place])     On Tue-Thur-Sat dialysis  . Chronic diastolic CHF (congestive heart failure) ([place])     a. echo 03/2015: EF 60-65%, normal wall motion, diastolic parameters were c/w restrictive physiology, indicative of decreased LV diastolic compliance and/or increased LA pressure, mild AS, mild MR, left atrium was severely dilated, RA was severely dilated, PASP was severely increased at 90 mm Hg  . Anemia   . Hyperparathyroidism, secondary renal ([place])   . Coronary artery disease, non-occlusive     a. cardiac cath 2013  . H/O ischemic right MCA stroke     a. 03/2015; b. residual left-sided facial droop  and slurred speech  . A-fib (HCC)     a. on warfarin  . Pulmonary HTN ([place])     as above  . Symptomatic bradycardia     a. history of symptomatic bradycardia; b. felt to be 2/2 metabolic abnormalities & required temp wire but no PPM, resolved with HD  . Carotid artery stenosis     a. ultrasound 03/2015: RICA 0000000 stenosis, LICA less than [id number] stenosis    Patient Active Problem List   Diagnosis Date Noted  . Gastric ulceration   . Acute GI bleeding   . GIB (gastrointestinal bleeding) 04/12/2015  . Left-sided weakness 04/01/2015  . Essential hypertension, malignant 04/01/2015  . Anemia of chronic disease 04/01/2015  . Elevated troponin 04/01/2015  . Pulmonary hypertension ([place]) 04/01/2015  . Atrial fibrillation ([place])   . CVA (cerebral infarction) 03/26/2015    Past Surgical History  Procedure Laterality Date  . Peripheral vascular catheterization N/A 02/23/2015    Procedure: A/V Shuntogram/Fistulagram;  Surgeon: Algernon Huxley, MD;  Location: [place] [facility];  Service: Cardiovascular;  Laterality: N/A;  . Peripheral vascular catheterization N/A 02/23/2015    Procedure: A/V Shunt Intervention;  Surgeon: Algernon Huxley, MD;  Location: [place] [facility];  Service: Cardiovascular;  Laterality: N/A;  . Esophagogastroduodenoscopy (egd) with propofol N/A 04/14/2015    Procedure: ESOPHAGOGASTRODUODENOSCOPY (EGD) WITH PROPOFOL;  Surgeon: Lucilla Lame, MD;  Location: [facility] ENDOSCOPY;  Service: Endoscopy;  Laterality: N/A;    Current Outpatient Rx  Name  Route  Sig  Dispense  Refill  . amLODipine (NORVASC) 10 MG tablet   Oral   Take 10 mg by mouth daily.         Marland Kitchen atorvastatin (LIPITOR) 40 MG tablet   Oral   Take 1 tablet (40 mg total) by mouth daily at 6 PM.   30 tablet   6   . carvedilol (COREG) 6.25 MG tablet   Oral   Take 1 tablet (6.25 mg total) by mouth 2 (two) times daily with a meal.   60 tablet   6   . feeding supplement, ENSURE COMPLETE, (ENSURE COMPLETE) LIQD    Oral   Take 237 mLs by mouth 3 (three) times daily with meals.   90 Bottle   6   . hydrALAZINE (APRESOLINE) 25 MG tablet   Oral   Take 25 mg by mouth 3 (three) times daily.          Marland Kitchen losartan (COZAAR) 100 MG tablet   Oral   Take 100 mg by mouth daily.         Marland Kitchen NEXIUM 40 MG capsule   Oral   Take 40 mg by mouth daily at 12 noon.            Dispense as written.   Marland Kitchen PHOSLYRA 667 MG/5ML SOLN   Oral   Take 1,334 mg by mouth 3 (three) times daily with meals.            Dispense as written.   . polyethylene glycol (MIRALAX / GLYCOLAX) packet   Oral   Take 17 g by mouth daily as needed for mild constipation.   14 each   0   . trolamine salicylate (ASPERCREME) 10 % cream   Topical   Apply topically 2 (two) times daily as needed for muscle pain.   85 g   0     Allergies Review of patient's allergies indicates no known allergies.  Family History  Problem Relation Age of Onset  . CAD Father   . Dementia Mother     Social History Social History  Substance Use Topics  . Smoking status: Former Smoker -- 0.25 packs/day for 30 years    Types: Cigarettes  . Smokeless tobacco: Never Used  . Alcohol Use: No   Review of Systems  Constitutional: Negative for fever. Eyes: Negative for visual changes. ENT: Negative for sore throat. Cardiovascular: Negative for chest pain. Respiratory: Negative for shortness of breath. Gastrointestinal: Negative for abdominal pain, vomiting and diarrhea. Genitourinary: Negative for dysuria. Musculoskeletal: Negative for back pain. Reports left inner thigh pain Skin: Negative for rash. Neurological: Negative for headaches, focal weakness or numbness. ____________________________________________  PHYSICAL EXAM:  VITAL SIGNS: ED Triage Vitals  Enc Vitals Group     BP --      Pulse --      Resp --      Temp --      Temp src --      SpO2 --      Weight --      Height --      Head Cir --      Peak Flow --      Pain Score  04/17/15 1405 9     Pain Loc --      Pain Edu? --      Excl. in [place]? --    Constitutional: Alert and oriented. Well appearing and in no distress. Head: Normocephalic and atraumatic.      Eyes: Conjunctivae are  normal. PERRL. Normal extraocular movements      Ears: Canals clear. TMs intact bilaterally.   Nose: No congestion/rhinorrhea.   Mouth/Throat: Mucous membranes are moist.   Neck: Supple. No thyromegaly. Hematological/Lymphatic/Immunological: No cervical lymphadenopathy. Cardiovascular: Normal rate, regular rhythm. Normal distal pulses Respiratory: Normal respiratory effort. No wheezes/rales/rhonchi. Gastrointestinal: Soft and nontender. No distention. Musculoskeletal: Left knee with DJD changes and moderate effusion noted. Normal LE ROM noted. No calf or achilles tenderness noted. Nontender with normal range of motion in all extremities.  Neurologic:  Normal gait without ataxia. Normal speech and language. Negative seated SLR bilaterally. No gross focal neurologic deficits are appreciated. Skin:  Skin is warm, dry and intact. No rash noted. Psychiatric: Mood and affect are normal. Patient exhibits appropriate insight and judgment. ____________________________________________    LABS (pertinent positives/negatives) Labs Reviewed  COMPREHENSIVE METABOLIC PANEL  CBC WITH DIFFERENTIAL/PLATELET  PROTIME-INR  TROPONIN I  ____________________________________________  EKG ED ECG REPORT   Date: 04/17/2015  EKG Time: 4:12 PM  Rate: 66  Rhythm: atrial flutter,  unchanged from previous tracings  Narrative Interpretation: No STEMI ___________________________________________   RADIOLOGY deferred ____________________________________________  PROCEDURES  Norco 5-325 mg PO ____________________________________________  INITIAL IMPRESSION / ASSESSMENT AND PLAN / ED COURSE  Patient presents to the ED following a fall at home. She is reporting difficulty with mobility at  this time. She is known to have had a recent acute stroke and a recent GI bleed. She is been discharged from the ED within the last 3 days following an admission. Her care be transferred to the attending physician in the ED. Consultation for Home health care for is pending.  ____________________________________________  FINAL CLINICAL IMPRESSION(S) / ED DIAGNOSES  Final diagnoses:  None      Melvenia Needles, PA-C 04/17/15 1618  Dannielle Karvonen Elkin, PA-C 04/17/15 1650  Delman Kitten, MD 04/18/15 0001

## 2015-04-17 NOTE — ED Notes (Signed)
RN called report for pt at [facility] to [place], Therapist, sports, said discharge instructions and prescriptions being sent with EMS is okay.

## 2015-04-17 NOTE — ED Notes (Signed)
RN at [facility] made aware of pt leaving [facility]

## 2015-04-17 NOTE — NC FL2 (Signed)
[place] LEVEL OF CARE SCREENING TOOL     IDENTIFICATION  Patient Name: Cana Oblinger [place] Birthdate: 1945/06/15 Sex: female Admission Date (Current Location): 04/17/2015  [place] and [place] Number:  Set designer )   Facility and Address:  [facility], [address] [place], [place] [address]      Provider Number: [id number]  Attending Physician Name and Address:  Nance Pear, MD  Relative Name and Phone Number:       Current Level of Care: Hospital Recommended Level of Care: [place] Prior Approval Number:    Date Approved/Denied:   PASRR Number:  ([id number])  Discharge Plan: SNF    Current Diagnoses: Patient Active Problem List   Diagnosis Date Noted  . Gastric ulceration   . Acute GI bleeding   . GIB (gastrointestinal bleeding) 04/12/2015  . Left-sided weakness 04/01/2015  . Essential hypertension, malignant 04/01/2015  . Anemia of chronic disease 04/01/2015  . Elevated troponin 04/01/2015  . Pulmonary hypertension ([place]) 04/01/2015  . Atrial fibrillation ([place])   . CVA (cerebral infarction) 03/26/2015    Orientation ACTIVITIES/SOCIAL BLADDER RESPIRATION    Self, Time, Situation, Place  Active Continent Normal  BEHAVIORAL SYMPTOMS/MOOD NEUROLOGICAL BOWEL NUTRITION STATUS      Continent Diet (Renal fluid restrictions)  PHYSICIAN VISITS COMMUNICATION OF NEEDS Height & Weight Skin    Verbally   147 lbs. Normal          AMBULATORY STATUS RESPIRATION    Assist extensive Normal      Personal Care Assistance Level of Assistance  Bathing, Feeding, Dressing Bathing Assistance: Maximum assistance Feeding assistance: Independent Dressing Assistance: Maximum assistance      Functional Limitations Info  Sight, Hearing, Speech Sight Info: Adequate Hearing Info: Adequate Speech Info: Adequate       SPECIAL CARE FACTORS FREQUENCY                      Additional Factors Info                  Current Medications (04/17/2015):  This is the current hospital active medication list No current facility-administered medications for this encounter.   Current Outpatient Prescriptions  Medication Sig Dispense Refill  . amLODipine (NORVASC) 10 MG tablet Take 10 mg by mouth daily.    Marland Kitchen atorvastatin (LIPITOR) 40 MG tablet Take 1 tablet (40 mg total) by mouth daily at 6 PM. 30 tablet 6  . carvedilol (COREG) 6.25 MG tablet Take 1 tablet (6.25 mg total) by mouth 2 (two) times daily with a meal. 60 tablet 6  . feeding supplement, ENSURE COMPLETE, (ENSURE COMPLETE) LIQD Take 237 mLs by mouth 3 (three) times daily with meals. 90 Bottle 6  . hydrALAZINE (APRESOLINE) 25 MG tablet Take 25 mg by mouth 3 (three) times daily.     Marland Kitchen losartan (COZAAR) 100 MG tablet Take 100 mg by mouth daily.    Marland Kitchen NEXIUM 40 MG capsule Take 40 mg by mouth daily at 12 noon.     Marland Kitchen PHOSLYRA 667 MG/5ML SOLN Take 1,334 mg by mouth 3 (three) times daily with meals.     . polyethylene glycol (MIRALAX / GLYCOLAX) packet Take 17 g by mouth daily as needed for mild constipation. 14 each 0  . trolamine salicylate (ASPERCREME) 10 % cream Apply topically 2 (two) times daily as needed for muscle pain. 85 g 0     Discharge Medications: Please see discharge summary for a list of  discharge medications.  Relevant Imaging Results:  Relevant Lab Results:  Recent Labs    Additional Information  (SS: [id number]) T TH Sat [employer] 10:00 chair time Maurine Cane, [place]

## 2015-04-17 NOTE — ED Notes (Signed)
Patient transported to X-ray 

## 2015-05-14 ENCOUNTER — Encounter: Payer: Medicare Other | Admitting: Nurse Practitioner

## 2015-07-02 ENCOUNTER — Emergency Department: Payer: Medicare Other

## 2015-07-02 ENCOUNTER — Inpatient Hospital Stay: Payer: Medicare Other

## 2015-07-02 ENCOUNTER — Encounter: Payer: Self-pay | Admitting: *Deleted

## 2015-07-02 ENCOUNTER — Inpatient Hospital Stay
Admission: EM | Admit: 2015-07-02 | Discharge: 2015-07-08 | DRG: 073 | Disposition: A | Discharge status: Skilled Nursing Facility | Payer: Medicare Other | Attending: Internal Medicine | Admitting: Internal Medicine

## 2015-07-02 DIAGNOSIS — R7989 Other specified abnormal findings of blood chemistry: Secondary | ICD-10-CM

## 2015-07-02 DIAGNOSIS — Z87891 Personal history of nicotine dependence: Secondary | ICD-10-CM

## 2015-07-02 DIAGNOSIS — Z66 Do not resuscitate: Secondary | ICD-10-CM | POA: Diagnosis present

## 2015-07-02 DIAGNOSIS — M79671 Pain in right foot: Secondary | ICD-10-CM | POA: Diagnosis not present

## 2015-07-02 DIAGNOSIS — D631 Anemia in chronic kidney disease: Secondary | ICD-10-CM | POA: Diagnosis present

## 2015-07-02 DIAGNOSIS — L97519 Non-pressure chronic ulcer of other part of right foot with unspecified severity: Secondary | ICD-10-CM | POA: Diagnosis present

## 2015-07-02 DIAGNOSIS — N2581 Secondary hyperparathyroidism of renal origin: Secondary | ICD-10-CM | POA: Diagnosis present

## 2015-07-02 DIAGNOSIS — N052 Unspecified nephritic syndrome with diffuse membranous glomerulonephritis: Secondary | ICD-10-CM | POA: Diagnosis present

## 2015-07-02 DIAGNOSIS — Z992 Dependence on renal dialysis: Secondary | ICD-10-CM

## 2015-07-02 DIAGNOSIS — Z79899 Other long term (current) drug therapy: Secondary | ICD-10-CM

## 2015-07-02 DIAGNOSIS — Z993 Dependence on wheelchair: Secondary | ICD-10-CM

## 2015-07-02 DIAGNOSIS — R531 Weakness: Secondary | ICD-10-CM

## 2015-07-02 DIAGNOSIS — Z8673 Personal history of transient ischemic attack (TIA), and cerebral infarction without residual deficits: Secondary | ICD-10-CM

## 2015-07-02 DIAGNOSIS — R600 Localized edema: Secondary | ICD-10-CM | POA: Diagnosis present

## 2015-07-02 DIAGNOSIS — I248 Other forms of acute ischemic heart disease: Secondary | ICD-10-CM | POA: Diagnosis present

## 2015-07-02 DIAGNOSIS — D696 Thrombocytopenia, unspecified: Secondary | ICD-10-CM | POA: Diagnosis not present

## 2015-07-02 DIAGNOSIS — M79605 Pain in left leg: Secondary | ICD-10-CM

## 2015-07-02 DIAGNOSIS — I5032 Chronic diastolic (congestive) heart failure: Secondary | ICD-10-CM | POA: Diagnosis present

## 2015-07-02 DIAGNOSIS — G629 Polyneuropathy, unspecified: Secondary | ICD-10-CM | POA: Diagnosis not present

## 2015-07-02 DIAGNOSIS — I70203 Unspecified atherosclerosis of native arteries of extremities, bilateral legs: Secondary | ICD-10-CM | POA: Diagnosis present

## 2015-07-02 DIAGNOSIS — M79604 Pain in right leg: Secondary | ICD-10-CM

## 2015-07-02 DIAGNOSIS — I2510 Atherosclerotic heart disease of native coronary artery without angina pectoris: Secondary | ICD-10-CM | POA: Diagnosis present

## 2015-07-02 DIAGNOSIS — D649 Anemia, unspecified: Secondary | ICD-10-CM | POA: Diagnosis not present

## 2015-07-02 DIAGNOSIS — Z7901 Long term (current) use of anticoagulants: Secondary | ICD-10-CM | POA: Diagnosis not present

## 2015-07-02 DIAGNOSIS — M5136 Other intervertebral disc degeneration, lumbar region: Secondary | ICD-10-CM

## 2015-07-02 DIAGNOSIS — K219 Gastro-esophageal reflux disease without esophagitis: Secondary | ICD-10-CM | POA: Diagnosis present

## 2015-07-02 DIAGNOSIS — I739 Peripheral vascular disease, unspecified: Secondary | ICD-10-CM

## 2015-07-02 DIAGNOSIS — R52 Pain, unspecified: Secondary | ICD-10-CM

## 2015-07-02 DIAGNOSIS — I120 Hypertensive chronic kidney disease with stage 5 chronic kidney disease or end stage renal disease: Secondary | ICD-10-CM | POA: Diagnosis not present

## 2015-07-02 DIAGNOSIS — N186 End stage renal disease: Secondary | ICD-10-CM | POA: Diagnosis not present

## 2015-07-02 DIAGNOSIS — I6523 Occlusion and stenosis of bilateral carotid arteries: Secondary | ICD-10-CM | POA: Diagnosis present

## 2015-07-02 DIAGNOSIS — I4891 Unspecified atrial fibrillation: Secondary | ICD-10-CM | POA: Diagnosis present

## 2015-07-02 DIAGNOSIS — R778 Other specified abnormalities of plasma proteins: Secondary | ICD-10-CM

## 2015-07-02 DIAGNOSIS — M79672 Pain in left foot: Secondary | ICD-10-CM

## 2015-07-02 DIAGNOSIS — D509 Iron deficiency anemia, unspecified: Secondary | ICD-10-CM | POA: Diagnosis present

## 2015-07-02 DIAGNOSIS — Z8249 Family history of ischemic heart disease and other diseases of the circulatory system: Secondary | ICD-10-CM

## 2015-07-02 DIAGNOSIS — I132 Hypertensive heart and chronic kidney disease with heart failure and with stage 5 chronic kidney disease, or end stage renal disease: Secondary | ICD-10-CM | POA: Diagnosis present

## 2015-07-02 DIAGNOSIS — M51369 Other intervertebral disc degeneration, lumbar region without mention of lumbar back pain or lower extremity pain: Secondary | ICD-10-CM

## 2015-07-02 DIAGNOSIS — D61818 Other pancytopenia: Secondary | ICD-10-CM | POA: Diagnosis present

## 2015-07-02 DIAGNOSIS — I70209 Unspecified atherosclerosis of native arteries of extremities, unspecified extremity: Secondary | ICD-10-CM

## 2015-07-02 LAB — CBC WITH DIFFERENTIAL/PLATELET
BASOS ABS: 0.1 10*3/uL (ref 0–0.1)
BASOS PCT: 2 %
Eosinophils Absolute: 0.1 10*3/uL (ref 0–0.7)
Eosinophils Relative: 1 %
HEMATOCRIT: 25.6 % — AB (ref 35.0–47.0)
HEMOGLOBIN: 7.9 g/dL — AB (ref 12.0–16.0)
LYMPHS PCT: 4 %
Lymphs Abs: 0.3 10*3/uL — ABNORMAL LOW (ref 1.0–3.6)
MCH: 29.4 pg (ref 26.0–34.0)
MCHC: 31 g/dL — ABNORMAL LOW (ref 32.0–36.0)
MCV: 94.6 fL (ref 80.0–100.0)
MONO ABS: 0.4 10*3/uL (ref 0.2–0.9)
MONOS PCT: 6 %
NEUTROS ABS: 6.2 10*3/uL (ref 1.4–6.5)
Neutrophils Relative %: 87 %
Platelets: 186 10*3/uL (ref 150–440)
RBC: 2.71 MIL/uL — ABNORMAL LOW (ref 3.80–5.20)
RDW: 18.6 % — AB (ref 11.5–14.5)
WBC: 7.1 10*3/uL (ref 3.6–11.0)

## 2015-07-02 LAB — COMPREHENSIVE METABOLIC PANEL
ALBUMIN: 3.5 g/dL (ref 3.5–5.0)
ALK PHOS: 245 U/L — AB (ref 38–126)
ALT: 15 U/L (ref 14–54)
AST: 18 U/L (ref 15–41)
Anion gap: 16 — ABNORMAL HIGH (ref 5–15)
BILIRUBIN TOTAL: 0.8 mg/dL (ref 0.3–1.2)
BUN: 83 mg/dL — AB (ref 6–20)
CALCIUM: 9.7 mg/dL (ref 8.9–10.3)
CO2: 23 mmol/L (ref 22–32)
CREATININE: 10.53 mg/dL — AB (ref 0.44–1.00)
Chloride: 104 mmol/L (ref 101–111)
GFR calc Af Amer: 4 mL/min — ABNORMAL LOW (ref >60)
GFR, EST NON AFRICAN AMERICAN: 3 mL/min — AB (ref >60)
GLUCOSE: 88 mg/dL (ref 65–99)
POTASSIUM: 5 mmol/L (ref 3.5–5.1)
Sodium: 143 mmol/L (ref 135–145)
TOTAL PROTEIN: 7 g/dL (ref 6.5–8.1)

## 2015-07-02 LAB — APTT: aPTT: 41 seconds — ABNORMAL HIGH (ref 24–36)

## 2015-07-02 LAB — TROPONIN I
TROPONIN I: 0.4 ng/mL — AB (ref <0.031)
TROPONIN I: 0.3 ng/mL — AB (ref <0.031)

## 2015-07-02 LAB — PROTIME-INR
INR: 1.19
Prothrombin Time: 15.3 seconds — ABNORMAL HIGH (ref 11.4–15.0)

## 2015-07-02 LAB — HEPARIN LEVEL (UNFRACTIONATED)

## 2015-07-02 LAB — MRSA PCR SCREENING: MRSA BY PCR: NEGATIVE

## 2015-07-02 MED ORDER — HYDRALAZINE HCL 25 MG PO TABS
25.0000 mg | ORAL_TABLET | Freq: Three times a day (TID) | ORAL | Status: DC
  Administered 2015-07-02 – 2015-07-08 (×16): 25 mg via ORAL
  Filled 2015-07-02 (×17): qty 1

## 2015-07-02 MED ORDER — GABAPENTIN 300 MG PO CAPS
300.0000 mg | ORAL_CAPSULE | Freq: Every day | ORAL | Status: DC
  Administered 2015-07-02 – 2015-07-05 (×4): 300 mg via ORAL
  Filled 2015-07-02 (×4): qty 1

## 2015-07-02 MED ORDER — LOSARTAN POTASSIUM 50 MG PO TABS
100.0000 mg | ORAL_TABLET | Freq: Every day | ORAL | Status: DC
  Administered 2015-07-02 – 2015-07-08 (×7): 100 mg via ORAL
  Filled 2015-07-02 (×7): qty 2

## 2015-07-02 MED ORDER — HEPARIN SODIUM (PORCINE) 5000 UNIT/ML IJ SOLN
60.0000 [IU]/kg | Freq: Once | INTRAMUSCULAR | Status: AC
  Administered 2015-07-02: 3800 [IU] via INTRAVENOUS

## 2015-07-02 MED ORDER — PANTOPRAZOLE SODIUM 40 MG PO TBEC
40.0000 mg | DELAYED_RELEASE_TABLET | Freq: Every day | ORAL | Status: DC
  Administered 2015-07-02 – 2015-07-08 (×7): 40 mg via ORAL
  Filled 2015-07-02 (×7): qty 1

## 2015-07-02 MED ORDER — DICLOFENAC SODIUM 1 % TD GEL
2.0000 g | Freq: Four times a day (QID) | TRANSDERMAL | Status: DC
  Administered 2015-07-03 – 2015-07-08 (×18): 2 g via TOPICAL
  Filled 2015-07-02 (×2): qty 100

## 2015-07-02 MED ORDER — POLYETHYLENE GLYCOL 3350 17 G PO PACK
17.0000 g | PACK | Freq: Every day | ORAL | Status: DC | PRN
  Administered 2015-07-08: 17 g via ORAL
  Filled 2015-07-02 (×2): qty 1

## 2015-07-02 MED ORDER — HEPARIN (PORCINE) IN NACL 100-0.45 UNIT/ML-% IJ SOLN
1250.0000 [IU]/h | INTRAMUSCULAR | Status: DC
  Administered 2015-07-02: 750 [IU]/h via INTRAVENOUS
  Administered 2015-07-03: 1250 [IU]/h via INTRAVENOUS
  Filled 2015-07-02 (×4): qty 250

## 2015-07-02 MED ORDER — ONDANSETRON HCL 4 MG/2ML IJ SOLN
4.0000 mg | Freq: Four times a day (QID) | INTRAMUSCULAR | Status: DC | PRN
  Administered 2015-07-04: 4 mg via INTRAVENOUS
  Filled 2015-07-02: qty 2

## 2015-07-02 MED ORDER — CARVEDILOL 6.25 MG PO TABS
6.2500 mg | ORAL_TABLET | Freq: Two times a day (BID) | ORAL | Status: DC
  Administered 2015-07-04 – 2015-07-06 (×4): 6.25 mg via ORAL
  Filled 2015-07-02 (×6): qty 1

## 2015-07-02 MED ORDER — SODIUM CHLORIDE 0.9% FLUSH
3.0000 mL | Freq: Two times a day (BID) | INTRAVENOUS | Status: DC
  Administered 2015-07-02 – 2015-07-08 (×12): 3 mL via INTRAVENOUS

## 2015-07-02 MED ORDER — OXYCODONE-ACETAMINOPHEN 5-325 MG PO TABS
1.0000 | ORAL_TABLET | ORAL | Status: DC | PRN
  Administered 2015-07-02 – 2015-07-04 (×3): 1 via ORAL
  Filled 2015-07-02 (×4): qty 1

## 2015-07-02 MED ORDER — HEPARIN BOLUS VIA INFUSION
1900.0000 [IU] | Freq: Once | INTRAVENOUS | Status: AC
  Administered 2015-07-02: 1900 [IU] via INTRAVENOUS
  Filled 2015-07-02: qty 1900

## 2015-07-02 MED ORDER — AMLODIPINE BESYLATE 10 MG PO TABS
10.0000 mg | ORAL_TABLET | Freq: Every day | ORAL | Status: DC
  Administered 2015-07-03 – 2015-07-08 (×6): 10 mg via ORAL
  Filled 2015-07-02 (×6): qty 1

## 2015-07-02 MED ORDER — ATORVASTATIN CALCIUM 20 MG PO TABS
40.0000 mg | ORAL_TABLET | Freq: Every day | ORAL | Status: DC
  Administered 2015-07-02 – 2015-07-07 (×5): 40 mg via ORAL
  Filled 2015-07-02 (×5): qty 2

## 2015-07-02 MED ORDER — CINACALCET HCL 30 MG PO TABS
60.0000 mg | ORAL_TABLET | Freq: Every day | ORAL | Status: DC
  Administered 2015-07-03 – 2015-07-07 (×5): 60 mg via ORAL
  Filled 2015-07-02 (×8): qty 2

## 2015-07-02 MED ORDER — CALCIUM ACETATE (PHOS BINDER) 667 MG/5ML PO SOLN
1334.0000 mg | Freq: Three times a day (TID) | ORAL | Status: DC
  Administered 2015-07-03 – 2015-07-08 (×8): 1334 mg via ORAL
  Filled 2015-07-02 (×38): qty 10

## 2015-07-02 MED ORDER — ACETAMINOPHEN 325 MG PO TABS: 650.0000 mg | ORAL_TABLET | Freq: Four times a day (QID) | ORAL | Status: DC | PRN

## 2015-07-02 MED ORDER — MORPHINE SULFATE (PF) 2 MG/ML IV SOLN
2.0000 mg | INTRAVENOUS | Status: DC | PRN
  Administered 2015-07-02 – 2015-07-05 (×5): 2 mg via INTRAVENOUS
  Filled 2015-07-02 (×5): qty 1

## 2015-07-02 MED ORDER — ENSURE COMPLETE PO LIQD
237.0000 mL | Freq: Three times a day (TID) | ORAL | Status: DC
  Administered 2015-07-03: 237 mL via ORAL
  Filled 2015-07-02: qty 237

## 2015-07-02 NOTE — Care Management Note (Signed)
Patient is active at [facility] on TTS schedule.  Patient missed treatment on Tuesday due to leg pain.  Clinic has been notified of admission and additional records will be sent at discharge.  Iran Sizer  Dialysis Coordinator  [phone]

## 2015-07-02 NOTE — ED Notes (Signed)
Pt arrives soiled with stool, pt cleaned and changed

## 2015-07-02 NOTE — ED Notes (Signed)
Spoke with Lovena Le, RN on floor and gave report. No questions at this time. Pt going to dialysis and then will go to floor post dialysis.

## 2015-07-02 NOTE — Progress Notes (Signed)
ANTICOAGULATION CONSULT NOTE - Initial Consult  Pharmacy Consult for Heparin Drip Indication: chest pain/ACS  No Known Allergies  Patient Measurements: Height: 5\' 6"  (167.6 cm) Weight: 140 lb (63.504 kg) IBW/kg (Calculated) : 59.3 Heparin Dosing Weight: 63.5 kg  Vital Signs: Temp: 98.2 F (36.8 C) (02/16 0812) Temp Source: Oral (02/16 0812) BP: 190/74 mmHg (02/16 0830) Pulse Rate: 42 (02/16 0830)  Labs:  Recent Labs  07/02/15 0817 07/02/15 0822  HGB  --  7.9*  HCT  --  25.6*  PLT  --  186  APTT 41*  --   LABPROT 15.3*  --   INR 1.19  --   CREATININE  --  10.53*  TROPONINI  --  0.40*    Estimated Creatinine Clearance: 4.9 mL/min (by C-G formula based on Cr of 10.53).   Medical History: Past Medical History  Diagnosis Date  . Hypertension   . ESRD (end stage renal disease) ([place])     On Tue-Thur-Sat dialysis  . Chronic diastolic CHF (congestive heart failure) ([place])     a. echo 03/2015: EF 60-65%, normal wall motion, diastolic parameters were c/w restrictive physiology, indicative of decreased LV diastolic compliance and/or increased LA pressure, mild AS, mild MR, left atrium was severely dilated, RA was severely dilated, PASP was severely increased at 90 mm Hg  . Anemia   . Hyperparathyroidism, secondary renal ([place])   . Coronary artery disease, non-occlusive     a. cardiac cath 2013  . H/O ischemic right MCA stroke     a. 03/2015; b. residual left-sided facial droop and slurred speech  . A-fib (HCC)     a. on warfarin  . Pulmonary HTN ([place])     as above  . Symptomatic bradycardia     a. history of symptomatic bradycardia; b. felt to be 2/2 metabolic abnormalities & required temp wire but no PPM, resolved with HD  . Carotid artery stenosis     a. ultrasound 03/2015: RICA 0000000 stenosis, LICA less than [id number] stenosis    Medications:   (Not in a hospital admission) Scheduled:   Infusions:  . heparin 750 Units/hr (07/02/15 1008)    Assessment: Pharmacy  to dose heparin for this 70 yo female with elevated troponin and peripheral vascular disease.  Goal of Therapy:  Heparin level 0.3-0.7 units/ml   Plan:  Give 3800 units bolus x 1 Start heparin infusion at 750 units/hr Check anti-Xa level in 8 hours and daily while on heparin Continue to monitor H&H and platelets   HL ordered for 2/16 at 18:00.  [place] Clinical Pharmacist 07/02/2015,10:13 AM

## 2015-07-02 NOTE — Progress Notes (Signed)
Patient admitted to unit. Oriented to room, call bell, and staff. Bed in lowest position. Fall safety plan reviewed. Full assessment to Epic. Skin assessment verified with --Georgina Quint RN---. Telemetry box verification with tele clerk and Georgina Quint RN - Box#: [phone]---. Will continue to monitor.

## 2015-07-02 NOTE — Consult Note (Signed)
[facility] Vascular Consult Note  MRN : [id number]  Toni Carlson is a 70 y.o. (1945-08-11) female who presents with chief complaint of  Chief Complaint  Patient presents with  . Weakness  . Leg Swelling  .  History of Present Illness: I am asked by Dr. Juleen China to see patient regarding her foot pain and assess her vascular status.  She says her legs have been hurting for month.  Both hurt, but the left leg is the worse of the toe.  Pain is a dull aching pain mostly in the feet and lower leg area. No open wounds.  No trauma or injury.  No clear inciting event that caused the problems.  Has a history of stroke with weakness that is pretty mild now.  No fever or chills.  Does have associated swelling which is mild to moderate.  Has carotid disease, ESRD and a litany of other issues so likelihood of PAD is high.  Primary service noted no pedal pulses on her leg foot.    Current Facility-Administered Medications  Medication Dose Route Frequency Provider Last Rate Last Dose  . acetaminophen (TYLENOL) tablet 650 mg  650 mg Oral Q6H PRN Gladstone Lighter, MD      . cinacalcet (SENSIPAR) tablet 60 mg  60 mg Oral Q supper Lavonia Dana, MD      . heparin ADULT infusion 100 units/mL (25000 units/250 mL)  750 Units/hr Intravenous Continuous Earleen Newport, MD 7.5 mL/hr at 07/02/15 1008 750 Units/hr at 07/02/15 1008  . morphine 2 MG/ML injection 2 mg  2 mg Intravenous Q4H PRN Gladstone Lighter, MD      . oxyCODONE-acetaminophen (PERCOCET/ROXICET) 5-325 MG per tablet 1 tablet  1 tablet Oral Q4H PRN Gladstone Lighter, MD        Past Medical History  Diagnosis Date  . Hypertension   . ESRD (end stage renal disease) ([place])     On Tue-Thur-Sat dialysis  . Chronic diastolic CHF (congestive heart failure) ([place])     a. echo 03/2015: EF 60-65%, normal wall motion, diastolic parameters were c/w restrictive physiology, indicative of decreased LV diastolic compliance and/or  increased LA pressure, mild AS, mild MR, left atrium was severely dilated, RA was severely dilated, PASP was severely increased at 90 mm Hg  . Anemia   . Hyperparathyroidism, secondary renal ([place])   . Coronary artery disease, non-occlusive     a. cardiac cath 2013  . H/O ischemic right MCA stroke     a. 03/2015; b. residual left-sided facial droop and slurred speech  . A-fib (HCC)     a. on warfarin  . Pulmonary HTN ([place])     as above  . Symptomatic bradycardia     a. history of symptomatic bradycardia; b. felt to be 2/2 metabolic abnormalities & required temp wire but no PPM, resolved with HD  . Carotid artery stenosis     a. ultrasound 03/2015: RICA 0000000 stenosis, LICA less than [id number] stenosis    Past Surgical History  Procedure Laterality Date  . Peripheral vascular catheterization N/A 02/23/2015    Procedure: A/V Shuntogram/Fistulagram;  Surgeon: Algernon Huxley, MD;  Location: [facility];  Service: Cardiovascular;  Laterality: N/A;  . Peripheral vascular catheterization N/A 02/23/2015    Procedure: A/V Shunt Intervention;  Surgeon: Algernon Huxley, MD;  Location: [facility];  Service: Cardiovascular;  Laterality: N/A;  . Esophagogastroduodenoscopy (egd) with propofol N/A 04/14/2015    Procedure: ESOPHAGOGASTRODUODENOSCOPY (EGD) WITH  PROPOFOL;  Surgeon: Lucilla Lame, MD;  Location: [facility];  Service: Endoscopy;  Laterality: N/A;    Social History Social History  Substance Use Topics  . Smoking status: Former Smoker -- 0.25 packs/day for 30 years    Types: Cigarettes  . Smokeless tobacco: Never Used  . Alcohol Use: No  no IVDU  Family History Family History  Problem Relation Age of Onset  . CAD Father   . Dementia Mother   no bleeding or clotting disorders, no autoimmune diseases  No Known Allergies   REVIEW OF SYSTEMS (Negative unless checked)  Constitutional: [] Weight loss  [] Fever  [] Chills Cardiac: [] Chest pain   [] Chest pressure    [] Palpitations   [] Shortness of breath when laying flat   [] Shortness of breath at rest   [] Shortness of breath with exertion. Vascular:  [] Pain in legs with walking   [] Pain in legs at rest   [] Pain in legs when laying flat   [x] Claudication   [x] Pain in feet when walking  [x] Pain in feet at rest  [] Pain in feet when laying flat   [] History of DVT   [] Phlebitis   [x] Swelling in legs   [] Varicose veins   [] Non-healing ulcers Pulmonary:   [] Uses home oxygen   [] Productive cough   [] Hemoptysis   [] Wheeze  [] COPD   [] Asthma Neurologic:  [] Dizziness  [] Blackouts   [] Seizures   [x] History of stroke   [] History of TIA  [] Aphasia   [] Temporary blindness   [] Dysphagia   [] Weakness or numbness in arms   [] Weakness or numbness in legs Musculoskeletal:  [] Arthritis   [] Joint swelling   [] Joint pain   [] Low back pain Hematologic:  [] Easy bruising  [] Easy bleeding   [] Hypercoagulable state   [] Anemic  [] Hepatitis Gastrointestinal:  [] Blood in stool   [] Vomiting blood  [] Gastroesophageal reflux/heartburn   [] Difficulty swallowing. Genitourinary:  [x] Chronic kidney disease   [] Difficult urination  [] Frequent urination  [] Burning with urination   [] Blood in urine Skin:  [] Rashes   [] Ulcers   [] Wounds Psychological:  [] History of anxiety   []  History of major depression.  Physical Examination  Filed Vitals:   07/02/15 1400 07/02/15 1430 07/02/15 1500 07/02/15 1530  BP: 159/78 142/86 156/64 154/128  Pulse: 58 60 58 63  Temp:      TempSrc:      Resp: 15 19 14 16   Height:      Weight:      SpO2:       Body mass index is 22.61 kg/(m^2). Gen:  WD/WN, NAD Head: [place]/AT, No temporalis wasting. Prominent temp pulse not noted. Ear/Nose/Throat: Hearing grossly intact, nares w/o erythema or drainage, oropharynx w/o Erythema/Exudate Eyes: PERRLA, EOMI.  Neck: Supple, no nuchal rigidity.  No JVD.  Pulmonary:  Good air movement, equal bilaterally.  Cardiac: irregular Vascular: hooked up to machine with her access  currently so unable to assess for thrill Vessel Right Left  Radial Palpable Palpable  Ulnar Palpable Palpable  Brachial Palpable Palpable      Aorta Not palpable N/A  Femoral Palpable Palpable  Popliteal Not Palpable Not Palpable  PT Palpable Not Palpable  DP Not Palpable Not Palpable   Gastrointestinal: soft, non-tender/non-distended. No guarding/reflex. No masses, surgical incisions, or scars. Musculoskeletal:   Extremities without ischemic changes.  No deformity or atrophy. Mild bilateral LE edema. Neurologic: CN 2-12 intact. Pain and light touch intact in extremities.  Symmetrical.  Speech is fluent. Mild Left sided weakness Psychiatric: Judgment intact, Mood & affect appropriate for pt's  clinical situation. Dermatologic: No rashes or ulcers noted.  No cellulitis or open wounds. Lymph : No Cervical, Axillary, or Inguinal lymphadenopathy.    CBC Lab Results  Component Value Date   WBC 7.1 07/02/2015   HGB 7.9* 07/02/2015   HCT 25.6* 07/02/2015   MCV 94.6 07/02/2015   PLT 186 07/02/2015    BMET    Component Value Date/Time   NA 143 07/02/2015 0822   NA 137 11/26/2013 0650   K 5.0 07/02/2015 0822   K 5.2* 11/26/2013 0650   CL 104 07/02/2015 0822   CL 102 11/26/2013 0650   CO2 23 07/02/2015 0822   CO2 26 11/26/2013 0650   GLUCOSE 88 07/02/2015 0822   GLUCOSE 87 11/26/2013 0650   BUN 83* 07/02/2015 0822   BUN 52* 11/26/2013 0650   CREATININE 10.53* 07/02/2015 0822   CREATININE 9.20* 11/26/2013 0650   CALCIUM 9.7 07/02/2015 0822   CALCIUM 8.8 11/26/2013 0650   GFRNONAA 3* 07/02/2015 0822   GFRNONAA 4* 11/26/2013 0650   GFRNONAA 11* 08/25/2011 0408   GFRAA 4* 07/02/2015 0822   GFRAA 5* 11/26/2013 0650   GFRAA 14* 08/25/2011 0408   Estimated Creatinine Clearance: 4.9 mL/min (by C-G formula based on Cr of 10.53).  COAG Lab Results  Component Value Date   INR 1.19 07/02/2015   INR 1.22 04/17/2015   INR 1.50 04/13/2015    Radiology Dg Chest 1  View  07/02/2015  CLINICAL DATA:  Weakness, diabetes mellitus, CHF, hypertension, coronary artery disease, former smoker, end-stage renal disease, atrial fibrillation, prior stroke, pulmonary hypertension EXAM: CHEST 1 VIEW COMPARISON:  Portable exam 0829 hours compared to 04/12/2015 FINDINGS: Enlargement of cardiac silhouette with pulmonary vascular congestion. Atherosclerotic calcification mild tortuosity of thoracic aorta. Chronic accentuation of interstitial markings likely reflects chronic failure. No acute superimposed pulmonary edema or segmental consolidation. No pleural effusion or pneumothorax. IMPRESSION: Enlargement of cardiac silhouette with pulmonary vascular congestion and minimal chronic failure. No new abnormalities. Electronically Signed   By: Lavonia Dana M.D.   On: 07/02/2015 08:51   Dg Foot 2 Views Left  07/02/2015  CLINICAL DATA:  Worsening LEFT foot pain and swelling over past week, no known injury, history end-stage renal disease on dialysis, CHF, former smoker, atrial fibrillation EXAM: LEFT FOOT - 2 VIEW COMPARISON:  None FINDINGS: Diffuse osseous demineralization. Soft tissue swelling diffusely. Joint spaces preserved. No acute fracture, dislocation, or bone destruction. Scattered small vessel vascular calcifications. IMPRESSION: Osseous demineralization and diffuse soft tissue swelling. No acute abnormalities. Electronically Signed   By: Lavonia Dana M.D.   On: 07/02/2015 11:34      Assessment/Plan 1. Foot pain.  Worrisome for ischemic rest pain in a patient with ESRD and a litany of atherosclerotic risk factors.  Pedal pulses not palpable.  Has been going on for weeks to months now, so not an emergent issue.  Foot is not mottled and neuro status is intact.  Would consider an angiogram, with her timing of symptoms would not do this emergently.  Will offer this some time next week as schedule allows.  Consider dual antiplatelet therapy and continue statin use.   2. ESRD. On HD  now.  Will need to continue her HD as normal 3. HTN. Stable. On outpatient meds 4. Carotid stenosis, stable with history of stroke. Last checked last fall   DEW,JASON, MD  07/02/2015 3:51 PM

## 2015-07-02 NOTE — ED Notes (Signed)
States decrease in mobility for the past week, last dialysis tx was Saturday, states gout in her right foot and states she has been falling a lot with pain in her left leg, pt awake and alert upon arrival

## 2015-07-02 NOTE — ED Notes (Signed)
Dr. Jimmye Norman notified of critical trop 0.40

## 2015-07-02 NOTE — ED Notes (Signed)
Pt given food and a drink

## 2015-07-02 NOTE — ED Notes (Signed)
Spoke with nurse in dialysis and states they will be here to get her in 20-25 minutes for dialysis. Pt made aware of update.

## 2015-07-02 NOTE — Progress Notes (Signed)
[facility]  ROUNDING NOTE   Subjective:   Missed dialysis Tuesday due to pain in her left foot. Patient thought her pain was due to gout.  Now in ED for evaluation of ischemic limb.   Objective:  Vital signs in last 24 hours:  Temp:  [98.2 F (36.8 C)] 98.2 F (36.8 C) (02/16 0812) Pulse Rate:  [42-51] 51 (02/16 1100) Resp:  [11-18] 14 (02/16 1100) BP: (130-190)/(47-74) 130/47 mmHg (02/16 1100) SpO2:  [95 %-100 %] 100 % (02/16 1100) Weight:  [63.504 kg (140 lb)] 63.504 kg (140 lb) (02/16 0812)  Weight change:  Filed Weights   07/02/15 0812  Weight: 63.504 kg (140 lb)    Intake/Output:     Intake/Output this shift:     Physical Exam: General: NAD, laying in bed  Head: Normocephalic, atraumatic. Moist oral mucosal membranes  Eyes: Anicteric, PERRL  Neck: Supple, trachea midline  Lungs:  Clear to auscultation  Heart: Regular rate and rhythm  Abdomen:  Soft, nontender,   Extremities: no peripheral edema.  Neurologic: Nonfocal, moving all four extremities  Skin: No lesions  Access: Left arm AVF    Basic Metabolic Panel:  Recent Labs Lab 07/02/15 0822  NA 143  K 5.0  CL 104  CO2 23  GLUCOSE 88  BUN 83*  CREATININE 10.53*  CALCIUM 9.7    Liver Function Tests:  Recent Labs Lab 07/02/15 0822  AST 18  ALT 15  ALKPHOS 245*  BILITOT 0.8  PROT 7.0  ALBUMIN 3.5   No results for input(s): LIPASE, AMYLASE in the last 168 hours. No results for input(s): AMMONIA in the last 168 hours.  CBC:  Recent Labs Lab 07/02/15 0822  WBC 7.1  NEUTROABS 6.2  HGB 7.9*  HCT 25.6*  MCV 94.6  PLT 186    Cardiac Enzymes:  Recent Labs Lab 07/02/15 0822  TROPONINI 0.40*    BNP: Invalid input(s): POCBNP  CBG: No results for input(s): GLUCAP in the last 168 hours.  Microbiology: Results for orders placed or performed during the hospital encounter of 04/12/15  MRSA PCR Screening     Status: None   Collection Time: 04/14/15  5:52 AM   Result Value Ref Range Status   MRSA by PCR NEGATIVE NEGATIVE Final    Comment:        The GeneXpert MRSA Assay (FDA approved for NASAL specimens only), is one component of a comprehensive MRSA colonization surveillance program. It is not intended to diagnose MRSA infection nor to guide or monitor treatment for MRSA infections.     Coagulation Studies:  Recent Labs  07/02/15 0817  LABPROT 15.3*  INR 1.19    Urinalysis: No results for input(s): COLORURINE, LABSPEC, PHURINE, GLUCOSEU, HGBUR, BILIRUBINUR, KETONESUR, PROTEINUR, UROBILINOGEN, NITRITE, LEUKOCYTESUR in the last 72 hours.  Invalid input(s): APPERANCEUR    Imaging: Dg Chest 1 View  07/02/2015  CLINICAL DATA:  Weakness, diabetes mellitus, CHF, hypertension, coronary artery disease, former smoker, end-stage renal disease, atrial fibrillation, prior stroke, pulmonary hypertension EXAM: CHEST 1 VIEW COMPARISON:  Portable exam 0829 hours compared to 04/12/2015 FINDINGS: Enlargement of cardiac silhouette with pulmonary vascular congestion. Atherosclerotic calcification mild tortuosity of thoracic aorta. Chronic accentuation of interstitial markings likely reflects chronic failure. No acute superimposed pulmonary edema or segmental consolidation. No pleural effusion or pneumothorax. IMPRESSION: Enlargement of cardiac silhouette with pulmonary vascular congestion and minimal chronic failure. No new abnormalities. Electronically Signed   By: Lavonia Dana M.D.   On: 07/02/2015 08:51  Medications:   . heparin 750 Units/hr (07/02/15 1008)     acetaminophen, morphine injection, oxyCODONE-acetaminophen  Assessment/ Plan:  Toni Carlson is a 70 y.o. black female with end stage renal disease on hemodialysis, GERD, anemia, atrial fibrillation, CVA, coronary artery disease, pulmonary hypertension, carotid artery stenosis who presents with left foot pain.   TTS [facility] [employer].   1. End Stage Renal Disease: missed  last dialysis treatment. Schedule patient for dialysis later today. Orders prepared.   2. Peripheral vascular disease: concern for ischemic leg.  - heparin gtt cautiously  - Consult vascular surgery, Dr. Lucky Cowboy.   3. Anemia of chronic kidney disease: hemoglobin 7.9. With history of recent GI bleed.  - hold epo due to possible ischemia.  - IV iron given as outpatient.   4. Secondary Hyperparathyroidism: PTH 3772 with phosphorus of 8. Not well controlled.  - phoslyra and sensipar.    LOS: 0 Toni Carlson 2/16/201711:09 AM

## 2015-07-02 NOTE — ED Provider Notes (Signed)
[facility] Emergency Department Provider Note     Time seen: ----------------------------------------- 8:12 AM on 07/02/2015 -----------------------------------------    I have reviewed the triage vital signs and the nursing notes.   HISTORY  Chief Complaint Weakness and Leg Swelling    HPI Toni Carlson is a 70 y.o. female who presents to the ER for weakness and leg swelling with bilateral foot pain. Patient states she was too weak to go to dialysis 2 days ago, last went on Saturday. She denies any recent illness, states her legs been hurting her and are more swollen than normal. Nothing makes her symptoms better or worse.She normally walks with a walker, but her legs are so weak and so painful that she can't walk. Patient has been falling.   Past Medical History  Diagnosis Date  . Hypertension   . ESRD (end stage renal disease) ([place])     On Tue-Thur-Sat dialysis  . Chronic diastolic CHF (congestive heart failure) ([place])     a. echo 03/2015: EF 60-65%, normal wall motion, diastolic parameters were c/w restrictive physiology, indicative of decreased LV diastolic compliance and/or increased LA pressure, mild AS, mild MR, left atrium was severely dilated, RA was severely dilated, PASP was severely increased at 90 mm Hg  . Anemia   . Hyperparathyroidism, secondary renal ([place])   . Coronary artery disease, non-occlusive     a. cardiac cath 2013  . H/O ischemic right MCA stroke     a. 03/2015; b. residual left-sided facial droop and slurred speech  . A-fib (HCC)     a. on warfarin  . Pulmonary HTN ([place])     as above  . Symptomatic bradycardia     a. history of symptomatic bradycardia; b. felt to be 2/2 metabolic abnormalities & required temp wire but no PPM, resolved with HD  . Carotid artery stenosis     a. ultrasound 03/2015: RICA 0000000 stenosis, LICA less than [id number] stenosis    Patient Active Problem List   Diagnosis Date Noted  . Gastric  ulceration   . Acute GI bleeding   . GIB (gastrointestinal bleeding) 04/12/2015  . Left-sided weakness 04/01/2015  . Essential hypertension, malignant 04/01/2015  . Anemia of chronic disease 04/01/2015  . Elevated troponin 04/01/2015  . Pulmonary hypertension ([place]) 04/01/2015  . Atrial fibrillation ([place])   . CVA (cerebral infarction) 03/26/2015    Past Surgical History  Procedure Laterality Date  . Peripheral vascular catheterization N/A 02/23/2015    Procedure: A/V Shuntogram/Fistulagram;  Surgeon: Algernon Huxley, MD;  Location: [place] [facility];  Service: Cardiovascular;  Laterality: N/A;  . Peripheral vascular catheterization N/A 02/23/2015    Procedure: A/V Shunt Intervention;  Surgeon: Algernon Huxley, MD;  Location: [place] [facility];  Service: Cardiovascular;  Laterality: N/A;  . Esophagogastroduodenoscopy (egd) with propofol N/A 04/14/2015    Procedure: ESOPHAGOGASTRODUODENOSCOPY (EGD) WITH PROPOFOL;  Surgeon: Lucilla Lame, MD;  Location: [facility] ENDOSCOPY;  Service: Endoscopy;  Laterality: N/A;    Allergies Review of patient's allergies indicates no known allergies.  Social History Social History  Substance Use Topics  . Smoking status: Former Smoker -- 0.25 packs/day for 30 years    Types: Cigarettes  . Smokeless tobacco: Never Used  . Alcohol Use: No    Review of Systems Constitutional: Negative for fever. Eyes: Negative for visual changes. ENT: Negative for sore throat. Cardiovascular: Negative for chest pain. Respiratory: Negative for shortness of breath. Gastrointestinal: Negative for abdominal pain, vomiting and diarrhea. Genitourinary:  Negative for dysuria. Musculoskeletal: Negative for back pain. Positive for leg swelling and pain in both feet and legs. Skin: Negative for rash. Neurological: Negative for headaches, positive for weakness  10-point ROS otherwise negative.  ____________________________________________   PHYSICAL EXAM:  VITAL SIGNS: ED  Triage Vitals  Enc Vitals Group     BP --      Pulse --      Resp --      Temp --      Temp src --      SpO2 --      Weight --      Height --      Head Cir --      Peak Flow --      Pain Score --      Pain Loc --      Pain Edu? --      Excl. in [place]? --     Constitutional: Alert and oriented. Mild distress Eyes: Conjunctivae are normal. PERRL. Normal extraocular movements. ENT   Head: Normocephalic and atraumatic.   Nose: No congestion/rhinnorhea.   Mouth/Throat: Mucous membranes are moist.   Neck: No stridor. Cardiovascular: Normal rate, regular rhythm. Only dopplerable pulses in lower extremities, ABIs are 0.47 in both legs. No murmurs, rubs, or gallops. Respiratory: Normal respiratory effort without tachypnea nor retractions. Breath sounds are clear and equal bilaterally. No wheezes/rales/rhonchi. Gastrointestinal: Soft and nontender. No distention. No abdominal bruits.  Musculoskeletal: Limited range of motion of the extremities, thrill and bruit present in the left AV fistula Neurologic:  Normal speech and language. No gross focal neurologic deficits are appreciated.  Skin:  Skin is warm, dry and intact. No rash noted. Psychiatric: Mood and affect are normal. Speech and behavior are normal. Patient exhibits appropriate insight and judgment. ____________________________________________  EKG: Interpreted by me. Normal sinus rhythm with sinus arrhythmia. Normal axis, normal PR interval, normal QRS, normal QT interval.  ____________________________________________  ED COURSE:  Pertinent labs & imaging results that were available during my care of the patient were reviewed by me and considered in my medical decision making (see chart for details). Patient with weakness of unclear etiology. I will check basic labs and reevaluate, all of her symptoms seem to be peripheral vascular disease related and claudication  related. ____________________________________________    LABS (pertinent positives/negatives)  Labs Reviewed  CBC WITH DIFFERENTIAL/PLATELET - Abnormal; Notable for the following:    RBC 2.71 (*)    Hemoglobin 7.9 (*)    HCT 25.6 (*)    MCHC 31.0 (*)    RDW 18.6 (*)    Lymphs Abs 0.3 (*)    All other components within normal limits  COMPREHENSIVE METABOLIC PANEL - Abnormal; Notable for the following:    BUN 83 (*)    Creatinine, Ser 10.53 (*)    Alkaline Phosphatase 245 (*)    GFR calc non Af Amer 3 (*)    GFR calc Af Amer 4 (*)    Anion gap 16 (*)    All other components within normal limits  TROPONIN I - Abnormal; Notable for the following:    Troponin I 0.40 (*)    All other components within normal limits  URINALYSIS COMPLETEWITH MICROSCOPIC ([facility] ONLY)   CRITICAL CARE Performed by: Earleen Newport   Total critical care time: 30 minutes  Critical care time was exclusive of separately billable procedures and treating other patients.  Critical care was necessary to treat or prevent imminent or life-threatening deterioration.  Critical  care was time spent personally by me on the following activities: development of treatment plan with patient and/or surrogate as well as nursing, discussions with consultants, evaluation of patient's response to treatment, examination of patient, obtaining history from patient or surrogate, ordering and performing treatments and interventions, ordering and review of laboratory studies, ordering and review of radiographic studies, pulse oximetry and re-evaluation of patient's condition.   RADIOLOGY  Chest x-ray IMPRESSION: Enlargement of cardiac silhouette with pulmonary vascular congestion and minimal chronic failure.  No new abnormalities. ____________________________________________  FINAL ASSESSMENT AND PLAN  Weakness, end-stage renal disease, peripheral vascular disease, elevated troponin  Plan: Patient with labs and  imaging as dictated above. Patient with significant worsening in her labs secondary to needing dialysis. She also has developed severe peripheral vascular disease and will need arteriograms both legs. I will place her on heparin for her elevated troponin and for her peripheral vascular disease. I discussed with nephrology who agrees with admission. I will discuss with the hospitalist for admission.   Earleen Newport, MD   Earleen Newport, MD 07/02/15 [id number]

## 2015-07-02 NOTE — Progress Notes (Signed)
ANTICOAGULATION CONSULT NOTE - FOLLOW UP   Pharmacy Consult for Heparin Drip Indication: chest pain/ACS  No Known Allergies  Patient Measurements: Height: 5\' 6"  (167.6 cm) Weight: 142 lb 1.6 oz (64.456 kg) IBW/kg (Calculated) : 59.3 Heparin Dosing Weight: 63.5 kg  Vital Signs: Temp: 99.2 F (37.3 C) (02/16 1928) Temp Source: Oral (02/16 1928) BP: 164/55 mmHg (02/16 1928) Pulse Rate: 105 (02/16 1928)  Labs:  Recent Labs  07/02/15 0817 07/02/15 0822 07/02/15 1805  HGB  --  7.9*  --   HCT  --  25.6*  --   PLT  --  186  --   APTT 41*  --   --   LABPROT 15.3*  --   --   INR 1.19  --   --   HEPARINUNFRC  --   --  <0.10*  CREATININE  --  10.53*  --   TROPONINI  --  0.40* 0.30*    Estimated Creatinine Clearance: 4.9 mL/min (by C-G formula based on Cr of 10.53).   Medical History: Past Medical History  Diagnosis Date  . Hypertension   . ESRD (end stage renal disease) ([place])     On Tue-Thur-Sat dialysis  . Chronic diastolic CHF (congestive heart failure) ([place])     a. echo 03/2015: EF 60-65%, normal wall motion, diastolic parameters were c/w restrictive physiology, indicative of decreased LV diastolic compliance and/or increased LA pressure, mild AS, mild MR, left atrium was severely dilated, RA was severely dilated, PASP was severely increased at 90 mm Hg  . Anemia   . Hyperparathyroidism, secondary renal ([place])   . Coronary artery disease, non-occlusive     a. cardiac cath 2013  . H/O ischemic right MCA stroke     a. 03/2015; b. residual left-sided facial droop and slurred speech  . A-fib (HCC)     a. on warfarin  . Pulmonary HTN ([place])     as above  . Symptomatic bradycardia     a. history of symptomatic bradycardia; b. felt to be 2/2 metabolic abnormalities & required temp wire but no PPM, resolved with HD  . Carotid artery stenosis     a. ultrasound 03/2015: RICA 0000000 stenosis, LICA less than [id number] stenosis    Medications:  Prescriptions prior to admission   Medication Sig Dispense Refill Last Dose  . amLODipine (NORVASC) 10 MG tablet Take 10 mg by mouth daily.   unsure  . atorvastatin (LIPITOR) 40 MG tablet Take 1 tablet (40 mg total) by mouth daily at 6 PM. 30 tablet 6 unsure  . carvedilol (COREG) 6.25 MG tablet Take 1 tablet (6.25 mg total) by mouth 2 (two) times daily with a meal. 60 tablet 6 unsure  . feeding supplement, ENSURE COMPLETE, (ENSURE COMPLETE) LIQD Take 237 mLs by mouth 3 (three) times daily with meals. 90 Bottle 6 unsure  . gabapentin (NEURONTIN) 300 MG capsule Take 1 capsule by mouth at bedtime.  6 unsure  . hydrALAZINE (APRESOLINE) 25 MG tablet Take 25 mg by mouth 3 (three) times daily.    unsure  . losartan (COZAAR) 100 MG tablet Take 100 mg by mouth daily.   unsure  . omeprazole (PRILOSEC) 20 MG capsule Take 1 capsule by mouth daily.  0 unsure  . PHOSLYRA 667 MG/5ML SOLN Take 1,334 mg by mouth 3 (three) times daily with meals.    unsure  . polyethylene glycol (MIRALAX / GLYCOLAX) packet Take 17 g by mouth daily as needed for mild constipation. 14 each 0 prn  at prn  . VOLTAREN 1 % GEL Apply 2 g topically 4 (four) times daily.    unsure  . warfarin (COUMADIN) 7.5 MG tablet Take 1 tablet by mouth every evening.  6 unsure  . trolamine salicylate (ASPERCREME) 10 % cream Apply topically 2 (two) times daily as needed for muscle pain. (Patient not taking: Reported on 07/02/2015) 85 g 0 Completed Course at Unknown time   Scheduled:  . amLODipine  10 mg Oral Daily  . atorvastatin  40 mg Oral q1800  . [START ON 07/03/2015] calcium acetate (Phos Binder)  1,334 mg Oral TID WC  . carvedilol  6.25 mg Oral BID WC  . cinacalcet  60 mg Oral Q supper  . diclofenac sodium  2 g Topical QID  . [START ON 07/03/2015] feeding supplement (ENSURE COMPLETE)  237 mL Oral TID WC  . gabapentin  300 mg Oral QHS  . [COMPLETED] heparin  1,900 Units Intravenous Once  . hydrALAZINE  25 mg Oral TID  . losartan  100 mg Oral Daily  . pantoprazole  40 mg Oral  Daily  . sodium chloride flush  3 mL Intravenous Q12H   Infusions:  . heparin 1,000 Units/hr (07/02/15 1951)    Assessment: Pharmacy to dose heparin for this 70 yo female with elevated troponin and peripheral vascular disease.  2/16 @ 18: 05 Heparin level resulted @ <.10   Goal of Therapy:  Heparin level 0.3-0.7 units/ml   Plan:   Will give heparin 1900 units bolus x 1 and will increase heparin gtt rate to 1000 units/hr. Will order next heparin level to be drawn @ 0430. Pharmacy to follow.  [place] Clinical Pharmacist 07/02/2015,7:52 PM

## 2015-07-02 NOTE — H&P (Signed)
[facility] NAME: Toni Carlson    MR#:  [id number]  DATE OF BIRTH:  December 10, 1945  DATE OF ADMISSION:  07/02/2015  PRIMARY CARE PHYSICIAN: Murlean Iba, MD   REQUESTING/REFERRING PHYSICIAN: Dr. Lenise Arena  CHIEF COMPLAINT:   Chief Complaint  Patient presents with  . Weakness  . Leg Swelling    HISTORY OF PRESENT ILLNESS:  Toni Carlson  is a 70 y.o. female with a known history of end-stage renal disease on Tuesday Thursday Saturday dialysis, chronic diastolic CHF, chronic anemia with recent admission in November 2016 for GI bleed requiring 3 units of packed RBC transfusion, history of ischemic right MCA stroke, atrial fibrillation not on anticoagulation due to GI bleed presents to the hospital from home secondary to worsening left foot pain and swelling and also missed hemodialysis for the last 2 sessions. Patient states her left foot pain has been going on for a [place] time now. She has actually pain and neuropathy in both feet but the left foot started to get more swollen and hurting even with the slightest pressure to walk. Her pulses are not palpable at this time. No necrosis or gangrene noted. Because of the pain she couldn't go to her dialysis this Tuesday for today. Last dialysis was Saturday last week. She complains of some difficulty breathing. Denies any chest pain, nausea vomiting or palpitations. Noted to have an elevated troponin today of 0.4. The potassium is at 5 and she will be going for dialysis today.  PAST MEDICAL HISTORY:   Past Medical History  Diagnosis Date  . Hypertension   . ESRD (end stage renal disease) ([place])     On Tue-Thur-Sat dialysis  . Chronic diastolic CHF (congestive heart failure) ([place])     a. echo 03/2015: EF 60-65%, normal wall motion, diastolic parameters were c/w restrictive physiology, indicative of decreased LV diastolic compliance and/or increased LA pressure, mild AS, mild MR, left  atrium was severely dilated, RA was severely dilated, PASP was severely increased at 90 mm Hg  . Anemia   . Hyperparathyroidism, secondary renal ([place])   . Coronary artery disease, non-occlusive     a. cardiac cath 2013  . H/O ischemic right MCA stroke     a. 03/2015; b. residual left-sided facial droop and slurred speech  . A-fib (HCC)     a. on warfarin  . Pulmonary HTN ([place])     as above  . Symptomatic bradycardia     a. history of symptomatic bradycardia; b. felt to be 2/2 metabolic abnormalities & required temp wire but no PPM, resolved with HD  . Carotid artery stenosis     a. ultrasound 03/2015: RICA 0000000 stenosis, LICA less than [id number] stenosis    PAST SURGICAL HISTORY:   Past Surgical History  Procedure Laterality Date  . Peripheral vascular catheterization N/A 02/23/2015    Procedure: A/V Shuntogram/Fistulagram;  Surgeon: Algernon Huxley, MD;  Location: [facility];  Service: Cardiovascular;  Laterality: N/A;  . Peripheral vascular catheterization N/A 02/23/2015    Procedure: A/V Shunt Intervention;  Surgeon: Algernon Huxley, MD;  Location: [facility];  Service: Cardiovascular;  Laterality: N/A;  . Esophagogastroduodenoscopy (egd) with propofol N/A 04/14/2015    Procedure: ESOPHAGOGASTRODUODENOSCOPY (EGD) WITH PROPOFOL;  Surgeon: Lucilla Lame, MD;  Location: [facility];  Service: Endoscopy;  Laterality: N/A;    SOCIAL HISTORY:   Social History  Substance Use Topics  . Smoking status: Former Smoker --  0.25 packs/day for 30 years    Types: Cigarettes  . Smokeless tobacco: Never Used  . Alcohol Use: No    FAMILY HISTORY:   Family History  Problem Relation Age of Onset  . CAD Father   . Dementia Mother     DRUG ALLERGIES:  No Known Allergies  REVIEW OF SYSTEMS:   Review of Systems  Constitutional: Positive for malaise/fatigue. Negative for fever, chills and weight loss.  HENT: Negative for ear discharge, ear pain, hearing loss and nosebleeds.    Eyes: Negative for blurred vision, double vision and photophobia.  Respiratory: Positive for shortness of breath. Negative for cough, hemoptysis and wheezing.   Cardiovascular: Negative for chest pain, palpitations, orthopnea and leg swelling.  Gastrointestinal: Negative for heartburn, nausea, vomiting, abdominal pain, diarrhea, constipation and melena.  Genitourinary: Negative for dysuria, urgency, frequency and hematuria.  Musculoskeletal: Positive for myalgias and joint pain. Negative for back pain and neck pain.  Skin: Negative for rash.  Neurological: Positive for speech change. Negative for dizziness, tingling, tremors, focal weakness and headaches.  Endo/Heme/Allergies: Does not bruise/bleed easily.  Psychiatric/Behavioral: Negative for depression.    MEDICATIONS AT HOME:   Prior to Admission medications   Medication Sig Start Date End Date Taking? Authorizing Provider  amLODipine (NORVASC) 10 MG tablet Take 10 mg by mouth daily. 04/02/15  Yes Historical Provider, MD  atorvastatin (LIPITOR) 40 MG tablet Take 1 tablet (40 mg total) by mouth daily at 6 PM. 04/03/15  Yes Theodoro Grist, MD  carvedilol (COREG) 6.25 MG tablet Take 1 tablet (6.25 mg total) by mouth 2 (two) times daily with a meal. 04/03/15  Yes Theodoro Grist, MD  feeding supplement, ENSURE COMPLETE, (ENSURE COMPLETE) LIQD Take 237 mLs by mouth 3 (three) times daily with meals. 04/03/15  Yes Theodoro Grist, MD  gabapentin (NEURONTIN) 300 MG capsule Take 1 capsule by mouth at bedtime. 06/23/15  Yes Historical Provider, MD  hydrALAZINE (APRESOLINE) 25 MG tablet Take 25 mg by mouth 3 (three) times daily.  04/07/15  Yes Historical Provider, MD  losartan (COZAAR) 100 MG tablet Take 100 mg by mouth daily.   Yes Historical Provider, MD  omeprazole (PRILOSEC) 20 MG capsule Take 1 capsule by mouth daily. 05/27/15  Yes Historical Provider, MD  [id number] MG/5ML SOLN Take 1,334 mg by mouth 3 (three) times daily with meals.  03/26/15  Yes  Historical Provider, MD  polyethylene glycol (MIRALAX / GLYCOLAX) packet Take 17 g by mouth daily as needed for mild constipation. 04/03/15  Yes Vipul Manuella Ghazi, MD  VOLTAREN 1 % GEL Apply 2 g topically 4 (four) times daily.  06/29/15  Yes Historical Provider, MD  warfarin (COUMADIN) 7.5 MG tablet Take 1 tablet by mouth every evening. 06/23/15  Yes Historical Provider, MD  trolamine salicylate (ASPERCREME) 10 % cream Apply topically 2 (two) times daily as needed for muscle pain. Patient not taking: Reported on 07/02/2015 04/03/15   Theodoro Grist, MD      VITAL SIGNS:  Blood pressure 148/64, pulse 42, temperature 98.2 F (36.8 C), temperature source Oral, resp. rate 11, height 5\' 6"  (1.676 m), weight 63.504 kg (140 lb), SpO2 95 %.  PHYSICAL EXAMINATION:   Physical Exam  GENERAL:  70 y.o.-year-old patient lying in the bed with no acute distress.  EYES: Pupils equal, round, reactive to light and accommodation. No scleral icterus. Extraocular muscles intact.  HEENT: Head atraumatic, normocephalic. Oropharynx and nasopharynx clear.  NECK:  Supple, no jugular venous distention. No thyroid enlargement, no tenderness.  LUNGS: Normal breath sounds bilaterally, no wheezing. Fine crackles at the bases heard. No rhonchi. No use of accessory muscles of respiration.  CARDIOVASCULAR: S1, S2 normal. No  rubs, or gallops. 2/6 systolic murmur is present ABDOMEN: Soft, nontender, nondistended. Supraumbilical hernia present. Bowel sounds present. No organomegaly or mass.  EXTREMITIES: No  cyanosis, or clubbing. 1+ left foot edema with tenderness to touch. No erythema noted. Difficult to palpate both dorsalis pedis pulses. NEUROLOGIC: Cranial nerves II through XII are intact. Muscle strength 5/5 in all extremities. Sensation intact. Gait not checked. Chronic speech changes due to prior CVA. But speech is understandable. No expressive aphasia noted. PSYCHIATRIC: The patient is alert and oriented x 3.  SKIN: No obvious  rash, lesion, or ulcer.   LABORATORY PANEL:   CBC  Recent Labs Lab 07/02/15 0822  WBC 7.1  HGB 7.9*  HCT 25.6*  PLT 186   ------------------------------------------------------------------------------------------------------------------  Chemistries   Recent Labs Lab 07/02/15 0822  NA 143  K 5.0  CL 104  CO2 23  GLUCOSE 88  BUN 83*  CREATININE 10.53*  CALCIUM 9.7  AST 18  ALT 15  ALKPHOS 245*  BILITOT 0.8   ------------------------------------------------------------------------------------------------------------------  Cardiac Enzymes  Recent Labs Lab 07/02/15 0822  TROPONINI 0.40*   ------------------------------------------------------------------------------------------------------------------  RADIOLOGY:  Dg Chest 1 View  07/02/2015  CLINICAL DATA:  Weakness, diabetes mellitus, CHF, hypertension, coronary artery disease, former smoker, end-stage renal disease, atrial fibrillation, prior stroke, pulmonary hypertension EXAM: CHEST 1 VIEW COMPARISON:  Portable exam 0829 hours compared to 04/12/2015 FINDINGS: Enlargement of cardiac silhouette with pulmonary vascular congestion. Atherosclerotic calcification mild tortuosity of thoracic aorta. Chronic accentuation of interstitial markings likely reflects chronic failure. No acute superimposed pulmonary edema or segmental consolidation. No pleural effusion or pneumothorax. IMPRESSION: Enlargement of cardiac silhouette with pulmonary vascular congestion and minimal chronic failure. No new abnormalities. Electronically Signed   By: Lavonia Dana M.D.   On: 07/02/2015 08:51    EKG:   Orders placed or performed during the hospital encounter of 07/02/15  . ED EKG  . ED EKG    IMPRESSION AND PLAN:   Dannie Ratterree  is a 70 y.o. female with a known history of end-stage renal disease on Tuesday Thursday Saturday dialysis, chronic diastolic CHF, chronic anemia with recent admission in November 2016 for GI bleed requiring 3  units of packed RBC transfusion, history of ischemic right MCA stroke, atrial fibrillation not on anticoagulation due to GI bleed presents to the hospital from home secondary to worsening left foot pain and swelling and also missed hemodialysis for the last 2 sessions.  #1 left foot pain-could be from peripheral arterial disease. Some swelling noted as well. Known history of arthritis. -Check foot x-ray. Poor dorsalis pedis pulses. -Also history of A. fib. Started on IV heparin drip. -Vascular consulted for the same. -Pain medications as needed.  #2 end-stage renal disease on hemodialysis-on Tuesday Thursday Saturday schedule. Missed her 2 dialysis sessions. We'll go for dialysis today per schedule. -Appreciate nephrology consult. -Continue PhosLo  #3 elevated troponin-likely demand ischemia and also from her renal failure due to poor renal clearance. -Remains on heparin drip. Continue to trend troponins. -Monitor on telemetry. Patient is not symptomatic at this time. -Last echocardiogram in November 2016 showing LVH, normal ejection fraction and diastolic dysfunction  #4 atrial fibrillation-off of warfarin due to GI bleed. Rate controlled, currently on Coreg  #5 chronic anemia-anemia of chronic disease due to end-stage renal disease. Baseline hemoglobin is around 8. Admission at  the end of November requiring transfusion after GI bleed. Warfarin discontinued then. -Currently hemoglobin at 7.9. Continue to monitor as she is on heparin drip today -Transfuse if hemoglobin is less than 7.  #6 recent CVA in November 2016-known history of A. fib and had stroke. However Coumadin has been discontinued for the last couple of months due to acute GI bleed requiring transfusion recently. -Continue to monitor. Aspirin on hold as patient is on heparin drip now. Continue statin  #7 hypertension-continue home medications-patient is on losartan, carvedilol, Norvasc and hydralazine  #8 DVT  prophylaxis-currently on heparin drip   Physical therapy consulted. Patient is wheelchair bound and also has a walker at baseline at home.    All the records are reviewed and case discussed with ED provider. Management plans discussed with the patient, family and they are in agreement.  CODE STATUS: Full code  TOTAL TIME TAKING CARE OF THIS PATIENT: 50 minutes.    Gladstone Lighter M.D on 07/02/2015 at 10:39 AM  Between 7am to 6pm - Pager - [phone]  After 6pm go to [email or web address] - password [email or web address] [facility]  Office  [phone]  CC: Primary care physician; Murlean Iba, MD

## 2015-07-03 ENCOUNTER — Encounter: Payer: Medicare Other | Admitting: Cardiovascular Disease

## 2015-07-03 ENCOUNTER — Encounter: Payer: Self-pay | Admitting: *Deleted

## 2015-07-03 LAB — CBC
HCT: 22.4 % — ABNORMAL LOW (ref 35.0–47.0)
Hemoglobin: 7 g/dL — ABNORMAL LOW (ref 12.0–16.0)
MCH: 28.9 pg (ref 26.0–34.0)
MCHC: 31.1 g/dL — ABNORMAL LOW (ref 32.0–36.0)
MCV: 93 fL (ref 80.0–100.0)
PLATELETS: 149 10*3/uL — AB (ref 150–440)
RBC: 2.41 MIL/uL — AB (ref 3.80–5.20)
RDW: 18.4 % — ABNORMAL HIGH (ref 11.5–14.5)
WBC: 5.5 10*3/uL (ref 3.6–11.0)

## 2015-07-03 LAB — BASIC METABOLIC PANEL
Anion gap: 9 (ref 5–15)
BUN: 39 mg/dL — AB (ref 6–20)
CHLORIDE: 102 mmol/L (ref 101–111)
CO2: 29 mmol/L (ref 22–32)
Calcium: 9.1 mg/dL (ref 8.9–10.3)
Creatinine, Ser: 5.77 mg/dL — ABNORMAL HIGH (ref 0.44–1.00)
GFR calc non Af Amer: 7 mL/min — ABNORMAL LOW (ref >60)
GFR, EST AFRICAN AMERICAN: 8 mL/min — AB (ref >60)
Glucose, Bld: 74 mg/dL (ref 65–99)
POTASSIUM: 4 mmol/L (ref 3.5–5.1)
SODIUM: 140 mmol/L (ref 135–145)

## 2015-07-03 LAB — TROPONIN I
TROPONIN I: 0.33 ng/mL — AB (ref <0.031)
TROPONIN I: 0.32 ng/mL — AB (ref <0.031)

## 2015-07-03 LAB — HEMOGLOBIN AND HEMATOCRIT, BLOOD
HCT: 28.1 % — ABNORMAL LOW (ref 35.0–47.0)
Hemoglobin: 9 g/dL — ABNORMAL LOW (ref 12.0–16.0)

## 2015-07-03 LAB — HEPARIN LEVEL (UNFRACTIONATED)

## 2015-07-03 LAB — PREPARE RBC (CROSSMATCH)

## 2015-07-03 MED ORDER — HEPARIN SODIUM (PORCINE) 5000 UNIT/ML IJ SOLN
5000.0000 [IU] | Freq: Three times a day (TID) | INTRAMUSCULAR | Status: DC
  Administered 2015-07-03 – 2015-07-06 (×7): 5000 [IU] via SUBCUTANEOUS
  Filled 2015-07-03 (×9): qty 1

## 2015-07-03 MED ORDER — CLOPIDOGREL BISULFATE 75 MG PO TABS
75.0000 mg | ORAL_TABLET | Freq: Every day | ORAL | Status: DC
  Administered 2015-07-03 – 2015-07-08 (×6): 75 mg via ORAL
  Filled 2015-07-03 (×6): qty 1

## 2015-07-03 MED ORDER — ACETAMINOPHEN 325 MG PO TABS
650.0000 mg | ORAL_TABLET | Freq: Four times a day (QID) | ORAL | Status: DC | PRN
Start: 2015-07-03 — End: 2015-07-08

## 2015-07-03 MED ORDER — SODIUM CHLORIDE 0.9 % IV SOLN
Freq: Once | INTRAVENOUS | Status: AC
  Administered 2015-07-03: 11:00:00 via INTRAVENOUS

## 2015-07-03 MED ORDER — HEPARIN BOLUS VIA INFUSION
2000.0000 [IU] | Freq: Once | INTRAVENOUS | Status: AC
  Administered 2015-07-03: 2000 [IU] via INTRAVENOUS
  Filled 2015-07-03: qty 2000

## 2015-07-03 MED ORDER — ENSURE ENLIVE PO LIQD
237.0000 mL | Freq: Three times a day (TID) | ORAL | Status: DC
  Administered 2015-07-03 – 2015-07-08 (×12): 237 mL via ORAL

## 2015-07-03 MED ORDER — ASPIRIN 81 MG PO CHEW
81.0000 mg | CHEWABLE_TABLET | Freq: Every day | ORAL | Status: DC
  Administered 2015-07-03 – 2015-07-07 (×5): 81 mg via ORAL
  Filled 2015-07-03 (×5): qty 1

## 2015-07-03 NOTE — Progress Notes (Signed)
Patient alert and oriented x4, no complaints at this time. vss at this time. Patient SB on telemetry. Second unit of blood infusing at this time. Will continue to assess. Wilnette Kales

## 2015-07-03 NOTE — Clinical Social Work Placement (Signed)
   CLINICAL SOCIAL WORK PLACEMENT  NOTE  Date:  07/03/2015  Patient Details  Name: Toni Carlson MRN: [id number] Date of Birth: July 11, 1945  Clinical Social Work is seeking post-discharge placement for this patient at the [place] [facility] level of care (*CSW will initial, date and re-position this form in  chart as items are completed):  Yes   Patient/family provided with [facility] Work Department's list of facilities offering this level of care within the geographic area requested by the patient (or if unable, by the patient's family).  Yes   Patient/family informed of their freedom to choose among providers that offer the needed level of care, that participate in Medicare, Medicaid or managed care program needed by the patient, have an available bed and are willing to accept the patient.  Yes   Patient/family informed of [facility]'s ownership interest in [facility] and [facility], as well as of the fact that they are under no obligation to receive care at these facilities.  PASRR submitted to EDS on       PASRR number received on       Existing PASRR number confirmed on 07/03/15     FL2 transmitted to all facilities in geographic area requested by pt/family on 07/03/15     FL2 transmitted to all facilities within larger geographic area on       Patient informed that his/her managed care company has contracts with or will negotiate with certain facilities, including the following:            Patient/family informed of bed offers received.  Patient chooses bed at       Physician recommends and patient chooses bed at      Patient to be transferred to   on  .  Patient to be transferred to facility by       Patient family notified on   of transfer.  Name of family member notified:        PHYSICIAN       Additional Comment:    _______________________________________________ Baldemar Lenis, LCSW 07/03/2015, 4:07 PM

## 2015-07-03 NOTE — Progress Notes (Signed)
Patient's carvedilol held this am d/t HR of 58. All other medications taken well, c/o pain in ankle, pain meds given at shift change will reassess. All other vss. Alert and oriented x4. Wilnette Kales

## 2015-07-03 NOTE — Progress Notes (Signed)
[facility] NAME: Toni Carlson    MR#:  [id number]  DATE OF BIRTH:  [birth year]  SUBJECTIVE:  CHIEF COMPLAINT:   Chief Complaint  Patient presents with  . Weakness  . Leg Swelling   Still both feet pain. REVIEW OF SYSTEMS:  CONSTITUTIONAL: No fever, has weakness.  EYES: No blurred or double vision.  EARS, NOSE, AND THROAT: No tinnitus or ear pain.  RESPIRATORY: No cough, shortness of breath, wheezing or hemoptysis.  CARDIOVASCULAR: No chest pain, orthopnea, edema.  GASTROINTESTINAL: No nausea, vomiting, diarrhea or abdominal pain. No melena or bloody stool. GENITOURINARY: No dysuria, hematuria.  ENDOCRINE: No polyuria, nocturia,  HEMATOLOGY: No anemia, easy bruising or bleeding SKIN: No rash or lesion. MUSCULOSKELETAL: both feet pain. NEUROLOGIC: No tingling, numbness, weakness.  PSYCHIATRY: No anxiety or depression.   DRUG ALLERGIES:  No Known Allergies  VITALS:  Blood pressure 131/55, pulse 49, temperature 98.6 F (37 C), temperature source Oral, resp. rate 18, height 5\' 6"  (1.676 m), weight 65 kg (143 lb 4.8 oz), SpO2 97 %.  PHYSICAL EXAMINATION:  GENERAL:  70 y.o.-year-old patient lying in the bed with no acute distress.  EYES: Pupils equal, round, reactive to light and accommodation. No scleral icterus. Extraocular muscles intact.  HEENT: Head atraumatic, normocephalic. Oropharynx and nasopharynx clear.  NECK:  Supple, no jugular venous distention. No thyroid enlargement, no tenderness.  LUNGS: Normal breath sounds bilaterally, no wheezing, rales,rhonchi or crepitation. No use of accessory muscles of respiration.  CARDIOVASCULAR: S1, S2 normal. No murmurs, rubs, or gallops.  ABDOMEN: Soft, nontender, nondistended. Bowel sounds present. No organomegaly or mass.  EXTREMITIES: trace pedal edema, no pedal pulses, no cyanosis, or clubbing.  NEUROLOGIC: Cranial nerves II through XII are intact. Muscle strength 4/5 in  all extremities. Sensation intact. Gait not checked.  PSYCHIATRIC: The patient is alert and oriented x 3.  SKIN: No obvious rash, lesion, or ulcer.    LABORATORY PANEL:   CBC  Recent Labs Lab 07/03/15 0426  WBC 5.5  HGB 7.0*  HCT 22.4*  PLT 149*   ------------------------------------------------------------------------------------------------------------------  Chemistries   Recent Labs Lab 07/02/15 0822 07/03/15 0426  NA 143 140  K 5.0 4.0  CL 104 102  CO2 23 29  GLUCOSE 88 74  BUN 83* 39*  CREATININE 10.53* 5.77*  CALCIUM 9.7 9.1  AST 18  --   ALT 15  --   ALKPHOS 245*  --   BILITOT 0.8  --    ------------------------------------------------------------------------------------------------------------------  Cardiac Enzymes  Recent Labs Lab 07/03/15 0426  TROPONINI 0.32*   ------------------------------------------------------------------------------------------------------------------  RADIOLOGY:  Dg Chest 1 View  07/02/2015  CLINICAL DATA:  Weakness, diabetes mellitus, CHF, hypertension, coronary artery disease, former smoker, end-stage renal disease, atrial fibrillation, prior stroke, pulmonary hypertension EXAM: CHEST 1 VIEW COMPARISON:  Portable exam 0829 hours compared to 04/12/2015 FINDINGS: Enlargement of cardiac silhouette with pulmonary vascular congestion. Atherosclerotic calcification mild tortuosity of thoracic aorta. Chronic accentuation of interstitial markings likely reflects chronic failure. No acute superimposed pulmonary edema or segmental consolidation. No pleural effusion or pneumothorax. IMPRESSION: Enlargement of cardiac silhouette with pulmonary vascular congestion and minimal chronic failure. No new abnormalities. Electronically Signed   By: Lavonia Dana M.D.   On: 07/02/2015 08:51   Dg Foot 2 Views Left  07/02/2015  CLINICAL DATA:  Worsening LEFT foot pain and swelling over past week, no known injury, history end-stage renal disease on  dialysis, CHF, former smoker, atrial fibrillation EXAM:  LEFT FOOT - 2 VIEW COMPARISON:  None FINDINGS: Diffuse osseous demineralization. Soft tissue swelling diffusely. Joint spaces preserved. No acute fracture, dislocation, or bone destruction. Scattered small vessel vascular calcifications. IMPRESSION: Osseous demineralization and diffuse soft tissue swelling. No acute abnormalities. Electronically Signed   By: Lavonia Dana M.D.   On: 07/02/2015 11:34    EKG:   Orders placed or performed during the hospital encounter of 07/02/15  . ED EKG  . ED EKG    ASSESSMENT AND PLAN:   Toni Carlson is a 70 y.o. female with a known history of end-stage renal disease on Tuesday Thursday Saturday dialysis, chronic diastolic CHF, chronic anemia with recent admission in November 2016 for GI bleed requiring 3 units of packed RBC transfusion, history of ischemic right MCA stroke, atrial fibrillation not on anticoagulation due to GI bleed presents to the hospital from home secondary to worsening left foot pain and swelling and also missed hemodialysis for the last 2 sessions.  #1 left foot pain-could be from peripheral arterial disease. Some swelling noted as well. Known history of arthritis. Foot x-ray: No acute abnormalities.  Poor dorsalis pedis pulses. -Also history of A. fib. Started on IV heparin drip. -Pain medications as needed. Per Dr. Lucky Cowboy, would consider an angiogram, with her timing of symptoms would not do this emergently. Will offer this some time next week as schedule allows. Consider dual antiplatelet therapy and continue statin use.  Discontinue heparin drip, start ASA and plavix.   #2 end-stage renal disease on hemodialysis-on Tuesday Thursday Saturday schedule. Missed her 2 dialysis sessions. Continue HD.  #3 elevated troponin-likely demand ischemia and also from her renal failure due to poor renal clearance. -Last echocardiogram in November 2016 showing LVH, normal ejection fraction and  diastolic dysfunction  #4 atrial fibrillation-off of warfarin due to GI bleed. Rate controlled, currently on Coreg  #5 chronic anemia-anemia of chronic disease due to end-stage renal disease. Baseline hemoglobin is around 8. Admission at the end of November requiring transfusion after GI bleed. Warfarin discontinued then. Hemoglobin is 7.0. PRBC transfusion, f/u Hb in am, discontinue heparin drip.  #6 recent CVA in November 2016-known history of A. fib and had stroke. However Coumadin has been discontinued for the last couple of months due to acute GI bleed requiring transfusion recently. Resume Aspirin, Continue statin  #7 hypertension-continue home medications-patient is on losartan, carvedilol, Norvasc and hydralazine  #8 DVT prophylaxis-change to heparin SQ.  Per Physical therapy consult, SNF placement.  Discussed with Dr. Lucky Cowboy and Dr. Delton Prairie. All the records are reviewed and case discussed with Care Management/Social Workerr. Management plans discussed with the patient, family and they are in agreement.  CODE STATUS full code.  TOTAL TIME TAKING CARE OF THIS PATIENT: 45 minutes.  Greater than 50% time was spent on coordination of care and face-to-face counseling.  POSSIBLE D/C IN >3 DAYS, DEPENDING ON CLINICAL CONDITION.   Demetrios Loll M.D on 07/03/2015 at 3:12 PM  Between 7am to 6pm - Pager - [phone]  After 6pm go to [email or web address] - password [email or web address] [facility]  Office  [phone]  CC: Primary care physician; Murlean Iba, MD

## 2015-07-03 NOTE — Progress Notes (Signed)
ANTICOAGULATION CONSULT NOTE - FOLLOW UP   Pharmacy Consult for Heparin Drip Indication: chest pain/ACS  No Known Allergies  Patient Measurements: Height: 5\' 6"  (167.6 cm) Weight: 143 lb 4.8 oz (65 kg) IBW/kg (Calculated) : 59.3 Heparin Dosing Weight: 63.5 kg  Vital Signs: Temp: 99.3 F (37.4 C) (02/17 0514) Temp Source: Oral (02/17 0514) BP: 140/59 mmHg (02/17 0514) Pulse Rate: 55 (02/17 0514)  Labs:  Recent Labs  07/02/15 0817 07/02/15 0822 07/02/15 1805 07/02/15 2309 07/03/15 0426  HGB  --  7.9*  --   --  7.0*  HCT  --  25.6*  --   --  22.4*  PLT  --  186  --   --  149*  APTT 41*  --   --   --   --   LABPROT 15.3*  --   --   --   --   INR 1.19  --   --   --   --   HEPARINUNFRC  --   --  <0.10*  --  <0.10*  CREATININE  --  10.53*  --   --   --   TROPONINI  --  0.40* 0.30* 0.33*  --     Estimated Creatinine Clearance: 4.9 mL/min (by C-G formula based on Cr of 10.53).   Medical History: Past Medical History  Diagnosis Date  . Hypertension   . ESRD (end stage renal disease) ([place])     On Tue-Thur-Sat dialysis  . Chronic diastolic CHF (congestive heart failure) ([place])     a. echo 03/2015: EF 60-65%, normal wall motion, diastolic parameters were c/w restrictive physiology, indicative of decreased LV diastolic compliance and/or increased LA pressure, mild AS, mild MR, left atrium was severely dilated, RA was severely dilated, PASP was severely increased at 90 mm Hg  . Anemia   . Hyperparathyroidism, secondary renal ([place])   . Coronary artery disease, non-occlusive     a. cardiac cath 2013  . H/O ischemic right MCA stroke     a. 03/2015; b. residual left-sided facial droop and slurred speech  . A-fib ([facility])     a. on warfarin  . Pulmonary HTN ([place])     as above  . Symptomatic bradycardia     a. history of symptomatic bradycardia; b. felt to be 2/2 metabolic abnormalities & required temp wire but no PPM, resolved with HD  . Carotid artery stenosis     a.  ultrasound 03/2015: RICA 0000000 stenosis, LICA less than [id number] stenosis    Medications:  Prescriptions prior to admission  Medication Sig Dispense Refill Last Dose  . amLODipine (NORVASC) 10 MG tablet Take 10 mg by mouth daily.   unsure  . atorvastatin (LIPITOR) 40 MG tablet Take 1 tablet (40 mg total) by mouth daily at 6 PM. 30 tablet 6 unsure  . carvedilol (COREG) 6.25 MG tablet Take 1 tablet (6.25 mg total) by mouth 2 (two) times daily with a meal. 60 tablet 6 unsure  . feeding supplement, ENSURE COMPLETE, (ENSURE COMPLETE) LIQD Take 237 mLs by mouth 3 (three) times daily with meals. 90 Bottle 6 unsure  . gabapentin (NEURONTIN) 300 MG capsule Take 1 capsule by mouth at bedtime.  6 unsure  . hydrALAZINE (APRESOLINE) 25 MG tablet Take 25 mg by mouth 3 (three) times daily.    unsure  . losartan (COZAAR) 100 MG tablet Take 100 mg by mouth daily.   unsure  . omeprazole (PRILOSEC) 20 MG capsule Take 1 capsule by mouth  daily.  0 unsure  . PHOSLYRA 667 MG/5ML SOLN Take 1,334 mg by mouth 3 (three) times daily with meals.    unsure  . polyethylene glycol (MIRALAX / GLYCOLAX) packet Take 17 g by mouth daily as needed for mild constipation. 14 each 0 prn at prn  . VOLTAREN 1 % GEL Apply 2 g topically 4 (four) times daily.    unsure  . warfarin (COUMADIN) 7.5 MG tablet Take 1 tablet by mouth every evening.  6 unsure  . trolamine salicylate (ASPERCREME) 10 % cream Apply topically 2 (two) times daily as needed for muscle pain. (Patient not taking: Reported on 07/02/2015) 85 g 0 Completed Course at Unknown time   Scheduled:  . amLODipine  10 mg Oral Daily  . atorvastatin  40 mg Oral q1800  . calcium acetate (Phos Binder)  1,334 mg Oral TID WC  . carvedilol  6.25 mg Oral BID WC  . cinacalcet  60 mg Oral Q supper  . diclofenac sodium  2 g Topical QID  . feeding supplement (ENSURE COMPLETE)  237 mL Oral TID WC  . gabapentin  300 mg Oral QHS  . heparin  2,000 Units Intravenous Once  . hydrALAZINE  25 mg  Oral TID  . losartan  100 mg Oral Daily  . pantoprazole  40 mg Oral Daily  . sodium chloride flush  3 mL Intravenous Q12H   Infusions:  . heparin 1,000 Units/hr (07/02/15 1951)    Assessment: Pharmacy to dose heparin for this 70 yo female with elevated troponin and peripheral vascular disease.  2/16 @ 18: 05 Heparin level resulted @ <.10   Goal of Therapy:  Heparin level 0.3-0.7 units/ml   Plan:  Heparin level subtherapeutic. Confirmed with nurse that infusion is running appropriately. 2000 unit IV x 1 bolus and increase rate to 1250 mL/hr. Will recheck heparin level in 8 hours.   Laural Benes, Pharm.D., BCPS Clinical Pharmacist 07/03/2015,6:41 AM

## 2015-07-03 NOTE — Clinical Social Work Note (Signed)
Clinical Social Work Assessment  Patient Details  Name: Toni Carlson MRN: [id number] Date of Birth: 12-26-1945  Date of referral:  07/03/15               Reason for consult:  Discharge Planning                Permission sought to share information with:  Family Supports Permission granted to share information::  Yes, Verbal Permission Granted  Name::        Agency::     Relationship::   Orvan Falconer- Daughter [phone])  Contact Information:     Housing/Transportation Living arrangements for the past 2 months:  [place] of Information:  Patient, Adult Children Orvan Falconer- Daughter [phone]) Patient Interpreter Needed:  None Criminal Activity/Legal Involvement Pertinent to Current Situation/Hospitalization:  No - Comment as needed Significant Relationships:  Adult Children Orvan Falconer- Daughter [phone]) Lives with:  Adult Children Orvan Falconer- Daughter [phone]) Do you feel safe going back to the place where you live?  Yes Need for family participation in patient care:  Yes (Comment) Orvan Falconer- Daughter [phone])  Care giving concerns:  PT Recommends SNF placement for patient at discharge.    Social Worker assessment / plan:  CSW met with patient at bedside. CSW explained her role. Per patient she lives with her daughter Orvan Falconer- Daughter [phone]. She stated that "someone" has spoke to her about SNF placement. Per Patient she recently was discharged from [facility]. She stated she'd like to return if possible. Patient granted CSW verbal permission to speak to her daughter. She also granted verbal permission for CSW to refer her to SNFs in [facility].   CSW called patient's daughter Orvan Falconer- Daughter [phone]. She reports that she believes that patient should go to SNF. She reports that her preference is [facility]. Requested she'd be updated with patient's status.   FL2/ PASRR completed and  faxed to [facility] in [facility]. Awaiting bed offers.   Employment status:  Retired Forensic scientist:  Medicare PT Recommendations:  [place] / Referral to community resources:  [place]  Patient/Family's Response to care:  Patient and her family are in agreement with SNF placement at discharge.   Patient/Family's Understanding of and Emotional Response to Diagnosis, Current Treatment, and Prognosis:  Patient reports that she understands CSW's role and is appreciative of her assistance.   Emotional Assessment Appearance:  Appears stated age Attitude/Demeanor/Rapport:   (None) Affect (typically observed):  Accepting, Calm, Pleasant Orientation:  Oriented to Place, Oriented to  Time, Oriented to Self, Oriented to Situation Alcohol / Substance use:  Not Applicable Psych involvement (Current and /or in the community):  No (Comment)  Discharge Needs  Concerns to be addressed:  Discharge Planning Concerns Readmission within the last 30 days:  No Current discharge risk:  Chronically ill Barriers to Discharge:  Continued Medical Work up   [employer], LCSW 07/03/2015, 4:09 PM

## 2015-07-03 NOTE — Care Management (Signed)
Patient open to [facility] for SN. Spoke with Malachy Mood at [facility] and informed her patient may discharge to SNF. Please update her at discharge.

## 2015-07-03 NOTE — NC FL2 (Signed)
[name] LEVEL OF CARE SCREENING TOOL     IDENTIFICATION  Patient Name: Toni Carlson Birthdate: [birth year] Sex: female Admission Date (Current Location): 07/02/2015  [facility] and [place] Number:  Toni Carlson  ([id number]) Facility and Address:  [facility], [address], [place],  [address]      Provider Number: [id number]  Attending Physician Name and Address:  Demetrios Loll, MD  Relative Name and Phone Number:       Current Level of Care: Hospital Recommended Level of Care: [place] Prior Approval Number:    Date Approved/Denied:   PASRR Number:  ([id number])  Discharge Plan: SNF    Current Diagnoses: Patient Active Problem List   Diagnosis Date Noted  . Pain in both feet 07/02/2015  . Gastric ulceration   . Acute GI bleeding   . GIB (gastrointestinal bleeding) 04/12/2015  . Left-sided weakness 04/01/2015  . Essential hypertension, malignant 04/01/2015  . Anemia of chronic disease 04/01/2015  . Elevated troponin 04/01/2015  . Pulmonary hypertension ([place]) 04/01/2015  . Atrial fibrillation ([place])   . CVA (cerebral infarction) 03/26/2015    Orientation RESPIRATION BLADDER Height & Weight     Self, Time, Situation, Place  Normal Continent Weight: 143 lb 4.8 oz (65 kg) Height:  5\' 6"  (167.6 cm)  BEHAVIORAL SYMPTOMS/MOOD NEUROLOGICAL BOWEL NUTRITION STATUS   (None)  (None) Continent Diet (renal with fluid restriction )  AMBULATORY STATUS COMMUNICATION OF NEEDS Skin   Extensive Assist Verbally Normal                       Personal Care Assistance Level of Assistance  Bathing, Feeding, Dressing Bathing Assistance: Limited assistance Feeding assistance: Independent Dressing Assistance: Limited assistance     Functional Limitations Info  Sight, Hearing, Speech Sight Info: Adequate Hearing Info: Adequate Speech Info: Adequate    SPECIAL CARE FACTORS FREQUENCY  PT (By licensed PT)     PT  Frequency:  (5)              Contractures      Additional Factors Info  Code Status, Allergies Code Status Info:  (Full Code) Allergies Info:  (No Known Allergies )           Current Medications (07/03/2015):  This is the current hospital active medication list Current Facility-Administered Medications  Medication Dose Route Frequency Provider Last Rate Last Dose  . acetaminophen (TYLENOL) tablet 650 mg  650 mg Oral Q6H PRN Lenis Noon, RPH      . amLODipine (NORVASC) tablet 10 mg  10 mg Oral Daily Gladstone Lighter, MD   10 mg at 07/03/15 0829  . aspirin chewable tablet 81 mg  81 mg Oral Daily Demetrios Loll, MD   81 mg at 07/03/15 1542  . atorvastatin (LIPITOR) tablet 40 mg  40 mg Oral q1800 Gladstone Lighter, MD   40 mg at 07/02/15 1814  . calcium acetate (Phos Binder) (PHOSLYRA) 667 MG/5ML oral solution 1,334 mg  1,334 mg Oral TID WC Gladstone Lighter, MD   1,334 mg at 07/03/15 1153  . carvedilol (COREG) tablet 6.25 mg  6.25 mg Oral BID WC Gladstone Lighter, MD   6.25 mg at 07/02/15 1804  . cinacalcet (SENSIPAR) tablet 60 mg  60 mg Oral Q supper Lavonia Dana, MD   60 mg at 07/02/15 1743  . clopidogrel (PLAVIX) tablet 75 mg  75 mg Oral Daily Demetrios Loll, MD   75 mg at 07/03/15 1542  .  diclofenac sodium (VOLTAREN) 1 % transdermal gel 2 g  2 g Topical QID Gladstone Lighter, MD   2 g at 07/03/15 1542  . feeding supplement (ENSURE ENLIVE) (ENSURE ENLIVE) liquid 237 mL  237 mL Oral TID WC Gladstone Lighter, MD   237 mL at 07/03/15 1200  . gabapentin (NEURONTIN) capsule 300 mg  300 mg Oral QHS Gladstone Lighter, MD   300 mg at 07/02/15 1950  . heparin injection 5,000 Units  5,000 Units Subcutaneous 3 times per day Demetrios Loll, MD   5,000 Units at 07/03/15 1530  . hydrALAZINE (APRESOLINE) tablet 25 mg  25 mg Oral TID Gladstone Lighter, MD   25 mg at 07/03/15 1542  . losartan (COZAAR) tablet 100 mg  100 mg Oral Daily Gladstone Lighter, MD   100 mg at 07/03/15 0830  . morphine 2 MG/ML  injection 2 mg  2 mg Intravenous Q4H PRN Gladstone Lighter, MD   2 mg at 07/03/15 1054  . ondansetron (ZOFRAN) injection 4 mg  4 mg Intravenous Q6H PRN Gladstone Lighter, MD      . oxyCODONE-acetaminophen (PERCOCET/ROXICET) 5-325 MG per tablet 1 tablet  1 tablet Oral Q4H PRN Gladstone Lighter, MD   1 tablet at 07/03/15 0706  . pantoprazole (PROTONIX) EC tablet 40 mg  40 mg Oral Daily Gladstone Lighter, MD   40 mg at 07/03/15 0829  . polyethylene glycol (MIRALAX / GLYCOLAX) packet 17 g  17 g Oral Daily PRN Gladstone Lighter, MD      . sodium chloride flush (NS) 0.9 % injection 3 mL  3 mL Intravenous Q12H Gladstone Lighter, MD   3 mL at 07/03/15 1000     Discharge Medications: Please see discharge summary for a list of discharge medications.  Relevant Imaging Results:  Relevant Lab Results:   Additional Information  (SSN [id number])  Lorenso Quarry Manisha Cancel, LCSW

## 2015-07-03 NOTE — Evaluation (Signed)
Physical Therapy Evaluation Patient Details Name: Toni Carlson MRN: [id number] DOB: [birth year] Today's Date: 07/03/2015   History of Present Illness  Pt admitted L foot pain with complaints of weakness/B LE swelling. Pt also with elevated troponin, however is downtrending at this time. Pt with history of ESRD, GI bleed from Nov 2016, HTN, and R MCA CVA. Per vascular consult, pt may have procedure next week. RN cleared pt to work with therapy this date.   Clinical Impression  Pt is a pleasant 70 year old female who was admitted for complaints of weakness/B LE swelling. Pt performs bed mobility/transfers with mod assist. Pt unable to come to standing secondary to pain/weakness. Unable to ambulate at this time. Pt is very motivated to return to PLOF. Pt demonstrates deficits with strength/mobility/endurance/pain. Would benefit from skilled PT to address above deficits and promote optimal return to PLOF; recommend transition to STR upon discharge from acute hospitalization.       Follow Up Recommendations SNF    Equipment Recommendations       Recommendations for Other Services       Precautions / Restrictions Precautions Precautions: Fall Restrictions Weight Bearing Restrictions: No      Mobility  Bed Mobility Overal bed mobility: Needs Assistance Bed Mobility: Supine to Sit     Supine to sit: Mod assist     General bed mobility comments: needs assist for scooting B LE towards EOB. Once seated at EOB, pt able to sit with supervision.  Transfers Overall transfer level: Needs assistance Equipment used: Rolling walker (2 wheeled) Transfers: Sit to/from Stand Sit to Stand: Mod assist         General transfer comment: attempted to stand, however pt unable to weight bear on B LE with L foot demonstrating increased pain. Also, B knee buckling noted during standing attempt. Further attempts deferred at this time Pt able to scoot using B UE up towards [facility] with cues for  weightshifting  Ambulation/Gait             General Gait Details: unable at this time  Stairs            Wheelchair Mobility    Modified Rankin (Stroke Patients Only)       Balance Overall balance assessment: History of Falls;Needs assistance Sitting-balance support: Feet supported Sitting balance-Leahy Scale: Good Sitting balance - Comments: No formal LOB noted, however pt reports increased falls in last few weeks secondary to weakness/pain                                     Pertinent Vitals/Pain Pain Assessment: 0-10 Pain Score: 8  Pain Location: L foot with weightbearing Pain Descriptors / Indicators: Constant;Discomfort    Home Living Family/patient expects to be discharged to:: Private residence Living Arrangements: Children Available Help at Discharge: Family (daughter is not available 24/7) Type of Home: Mobile home Home Access: Stairs to enter Entrance Stairs-Rails: Can reach both Entrance Stairs-Number of Steps: 4 Home Layout: One level Home Equipment: Wheelchair - [employer] - 2 wheels;Cane - single point      Prior Function Level of Independence: Needs assistance         Comments: daughter helps with mobility in/out of bed. Pt uses rw to ambulate short distances, and WC for community distances     Hand Dominance        Extremity/Trunk Assessment   Upper Extremity Assessment: Generalized weakness (  grossly 4/5)           Lower Extremity Assessment: Generalized weakness (grossly 2+/5)         Communication   Communication: No difficulties  Cognition Arousal/Alertness: Awake/alert Behavior During Therapy: WFL for tasks assessed/performed Overall Cognitive Status: Within Functional Limits for tasks assessed                      General Comments General comments (skin integrity, edema, etc.): Pt appears in windswept position towards L side. Pt able to correct positioning, however reverts back to this  position easily    Exercises Other Exercises Other Exercises: Supine ther-ex performed including B LE SLRs, hip abd/add, knee flexion and ankle pumps. All ther-ex performed x 10 reps with min assist for correct technique.      Assessment/Plan    PT Assessment Patient needs continued PT services  PT Diagnosis Difficulty walking;Acute pain;Generalized weakness   PT Problem List Decreased strength;Decreased balance;Decreased mobility;Pain  PT Treatment Interventions Gait training;Therapeutic exercise;DME instruction   PT Goals (Current goals can be found in the Care Plan section) Acute Rehab PT Goals Patient Stated Goal: to get stronger PT Goal Formulation: With patient Time For Goal Achievement: 07/17/15 Potential to Achieve Goals: Good    Frequency Min 2X/week   Barriers to discharge Decreased caregiver support;Inaccessible home environment      Co-evaluation               End of Session Equipment Utilized During Treatment: Gait belt Activity Tolerance: Patient limited by pain Patient left: in bed;with bed alarm set Nurse Communication: Mobility status         Time: [id number] PT Time Calculation (min) (ACUTE ONLY): 25 min   Charges:   PT Evaluation $PT Eval Moderate Complexity: 1 Procedure PT Treatments $Therapeutic Exercise: 8-22 mins   PT G Codes:        Toni Carlson 2015-08-01, 12:13 PM  Greggory Stallion, PT, DPT [phone]

## 2015-07-03 NOTE — Progress Notes (Signed)
[facility]  ROUNDING NOTE   Subjective:   Hemodialysis treatment yesterday. Tolerated treatment well. UF of 1 litre.  Some confusion this morning Working with PT.   Objective:  Vital signs in last 24 hours:  Temp:  [98.2 F (36.8 C)-99.3 F (37.4 C)] 99.3 F (37.4 C) (02/17 0514) Pulse Rate:  [43-105] 58 (02/17 0827) Resp:  [13-22] 20 (02/17 0514) BP: (130-164)/(41-128) 153/41 mmHg (02/17 0827) SpO2:  [95 %-100 %] 95 % (02/17 0514) Weight:  [62.5 kg (137 lb 12.6 oz)-65 kg (143 lb 4.8 oz)] 65 kg (143 lb 4.8 oz) (02/17 0514)  Weight change:  Filed Weights   07/02/15 1643 07/02/15 1729 07/03/15 0514  Weight: 62.5 kg (137 lb 12.6 oz) 64.456 kg (142 lb 1.6 oz) 65 kg (143 lb 4.8 oz)    Intake/Output: I/O last 3 completed shifts: In: 77.9 [I.V.:77.9] Out: 1000 [Other:1000]   Intake/Output this shift:     Physical Exam: General: NAD, laying in bed  Head: Normocephalic, atraumatic. Moist oral mucosal membranes  Eyes: Anicteric, PERRL  Neck: Supple, trachea midline  Lungs:  Clear to auscultation  Heart: Regular rate and rhythm  Abdomen:  Soft, nontender,   Extremities: no peripheral edema.  Neurologic: Nonfocal, moving all four extremities  Skin: No lesions  Access: Left arm AVF    Basic Metabolic Panel:  Recent Labs Lab 07/02/15 0822 07/03/15 0426  NA 143 140  K 5.0 4.0  CL 104 102  CO2 23 29  GLUCOSE 88 74  BUN 83* 39*  CREATININE 10.53* 5.77*  CALCIUM 9.7 9.1    Liver Function Tests:  Recent Labs Lab 07/02/15 0822  AST 18  ALT 15  ALKPHOS 245*  BILITOT 0.8  PROT 7.0  ALBUMIN 3.5   No results for input(s): LIPASE, AMYLASE in the last 168 hours. No results for input(s): AMMONIA in the last 168 hours.  CBC:  Recent Labs Lab 07/02/15 0822 07/03/15 0426  WBC 7.1 5.5  NEUTROABS 6.2  --   HGB 7.9* 7.0*  HCT 25.6* 22.4*  MCV 94.6 93.0  PLT 186 149*    Cardiac Enzymes:  Recent Labs Lab 07/02/15 0822 07/02/15 1805  07/02/15 2309 07/03/15 0426  TROPONINI 0.40* 0.30* 0.33* 0.32*    BNP: Invalid input(s): POCBNP  CBG: No results for input(s): GLUCAP in the last 168 hours.  Microbiology: Results for orders placed or performed during the hospital encounter of 07/02/15  MRSA PCR Screening     Status: None   Collection Time: 07/02/15  5:50 PM  Result Value Ref Range Status   MRSA by PCR NEGATIVE NEGATIVE Final    Comment:        The GeneXpert MRSA Assay (FDA approved for NASAL specimens only), is one component of a comprehensive MRSA colonization surveillance program. It is not intended to diagnose MRSA infection nor to guide or monitor treatment for MRSA infections.     Coagulation Studies:  Recent Labs  07/02/15 0817  LABPROT 15.3*  INR 1.19    Urinalysis: No results for input(s): COLORURINE, LABSPEC, PHURINE, GLUCOSEU, HGBUR, BILIRUBINUR, KETONESUR, PROTEINUR, UROBILINOGEN, NITRITE, LEUKOCYTESUR in the last 72 hours.  Invalid input(s): APPERANCEUR    Imaging: Dg Chest 1 View  07/02/2015  CLINICAL DATA:  Weakness, diabetes mellitus, CHF, hypertension, coronary artery disease, former smoker, end-stage renal disease, atrial fibrillation, prior stroke, pulmonary hypertension EXAM: CHEST 1 VIEW COMPARISON:  Portable exam 0829 hours compared to 04/12/2015 FINDINGS: Enlargement of cardiac silhouette with pulmonary vascular congestion. Atherosclerotic calcification  mild tortuosity of thoracic aorta. Chronic accentuation of interstitial markings likely reflects chronic failure. No acute superimposed pulmonary edema or segmental consolidation. No pleural effusion or pneumothorax. IMPRESSION: Enlargement of cardiac silhouette with pulmonary vascular congestion and minimal chronic failure. No new abnormalities. Electronically Signed   By: Lavonia Dana M.D.   On: 07/02/2015 08:51   Dg Foot 2 Views Left  07/02/2015  CLINICAL DATA:  Worsening LEFT foot pain and swelling over past week, no known  injury, history end-stage renal disease on dialysis, CHF, former smoker, atrial fibrillation EXAM: LEFT FOOT - 2 VIEW COMPARISON:  None FINDINGS: Diffuse osseous demineralization. Soft tissue swelling diffusely. Joint spaces preserved. No acute fracture, dislocation, or bone destruction. Scattered small vessel vascular calcifications. IMPRESSION: Osseous demineralization and diffuse soft tissue swelling. No acute abnormalities. Electronically Signed   By: Lavonia Dana M.D.   On: 07/02/2015 11:34     Medications:   . heparin 1,250 Units/hr (07/03/15 [id number])   . sodium chloride   Intravenous Once  . amLODipine  10 mg Oral Daily  . atorvastatin  40 mg Oral q1800  . calcium acetate (Phos Binder)  1,334 mg Oral TID WC  . carvedilol  6.25 mg Oral BID WC  . cinacalcet  60 mg Oral Q supper  . diclofenac sodium  2 g Topical QID  . feeding supplement (ENSURE ENLIVE)  237 mL Oral TID WC  . gabapentin  300 mg Oral QHS  . hydrALAZINE  25 mg Oral TID  . losartan  100 mg Oral Daily  . pantoprazole  40 mg Oral Daily  . sodium chloride flush  3 mL Intravenous Q12H   acetaminophen, morphine injection, ondansetron (ZOFRAN) IV, oxyCODONE-acetaminophen, polyethylene glycol  Assessment/ Plan:  Ms. Jacqulyn Amesquita Kelleher is a 70 y.o. black female with end stage renal disease on hemodialysis, GERD, anemia, atrial fibrillation, CVA, coronary artery disease, pulmonary hypertension, carotid artery stenosis who presents with left foot pain.   TTS [facility] [employer].   1. End Stage Renal Disease: hemodialysis treatment yesterday. Tolerated treatment well.  - Continue TTS schedule.   2. Peripheral vascular disease: concern for ischemic leg.  - heparin gtt cautiously with dropping hemoglobin and platelets.  - Appreciate vascular surgery, Dr. Lucky Cowboy. Procedure for next week.   3. Anemia of chronic kidney disease: hemoglobin 7. With history of recent GI bleed.  - PRBC transfusion ordered.  - hold epo due to possible  ischemia.  - IV iron given as outpatient.   4. Secondary Hyperparathyroidism: PTH 3772 with phosphorus of 8. Not well controlled.  - phoslyra and sensipar.   5. Hypertension: well controlled.  - losartan, carvedilol and amlodipine.    LOS: [place], Helon Wisinski 2/17/201710:44 AM

## 2015-07-04 LAB — BASIC METABOLIC PANEL
Anion gap: 10 (ref 5–15)
BUN: 52 mg/dL — AB (ref 6–20)
CALCIUM: 8.9 mg/dL (ref 8.9–10.3)
CO2: 31 mmol/L (ref 22–32)
CREATININE: 7.31 mg/dL — AB (ref 0.44–1.00)
Chloride: 99 mmol/L — ABNORMAL LOW (ref 101–111)
GFR calc non Af Amer: 5 mL/min — ABNORMAL LOW (ref >60)
GFR, EST AFRICAN AMERICAN: 6 mL/min — AB (ref >60)
GLUCOSE: 84 mg/dL (ref 65–99)
Potassium: 4.9 mmol/L (ref 3.5–5.1)
Sodium: 140 mmol/L (ref 135–145)

## 2015-07-04 LAB — CBC
HCT: 27.1 % — ABNORMAL LOW (ref 35.0–47.0)
Hemoglobin: 8.8 g/dL — ABNORMAL LOW (ref 12.0–16.0)
MCH: 29.8 pg (ref 26.0–34.0)
MCHC: 32.4 g/dL (ref 32.0–36.0)
MCV: 92.2 fL (ref 80.0–100.0)
PLATELETS: 135 10*3/uL — AB (ref 150–440)
RBC: 2.94 MIL/uL — AB (ref 3.80–5.20)
RDW: 18.4 % — ABNORMAL HIGH (ref 11.5–14.5)
WBC: 5.6 10*3/uL (ref 3.6–11.0)

## 2015-07-04 LAB — TYPE AND SCREEN
ABO/RH(D): A POS
Antibody Screen: NEGATIVE
UNIT DIVISION: 0
UNIT DIVISION: 0

## 2015-07-04 NOTE — Progress Notes (Signed)
Patient rested quietly tonight, pain managed with scheduled topical ointment and PRN morphine. A&Ox4, VSS, and NSR on tele. Nursing staff will continue to monitor. Earleen Reaper, RN

## 2015-07-04 NOTE — Progress Notes (Signed)
Initial Nutrition Assessment    INTERVENTION:   Meals and Snacks: Cater to patient preferences Medical Food Supplement Therapy: continue Ensure supplements as ordered by MD   NUTRITION DIAGNOSIS:   Reassess on follow-up  GOAL:   Patient will meet greater than or equal to 90% of their needs  MONITOR:    (Energy Intake, Anthropometrics, Electrolyte/Renal Profile, Digestive System)  REASON FOR ASSESSMENT:    (Renal Diet)    ASSESSMENT:    Pt admitted with weakness and LE swelling with pain, vascular surgery following with plan for angiogram; pt with hx of ESRD on HD, down for dialysis on visit this AM  Past Medical History  Diagnosis Date  . Hypertension   . ESRD (end stage renal disease) ([place])     On Tue-Thur-Sat dialysis  . Chronic diastolic CHF (congestive heart failure) ([place])     a. echo 03/2015: EF 60-65%, normal wall motion, diastolic parameters were c/w restrictive physiology, indicative of decreased LV diastolic compliance and/or increased LA pressure, mild AS, mild MR, left atrium was severely dilated, RA was severely dilated, PASP was severely increased at 90 mm Hg  . Anemia   . Hyperparathyroidism, secondary renal ([place])   . Coronary artery disease, non-occlusive     a. cardiac cath 2013  . H/O ischemic right MCA stroke     a. 03/2015; b. residual left-sided facial droop and slurred speech  . A-fib ([facility])     a. on warfarin  . Pulmonary HTN ([place])     as above  . Symptomatic bradycardia     a. history of symptomatic bradycardia; b. felt to be 2/2 metabolic abnormalities & required temp wire but no PPM, resolved with HD  . Carotid artery stenosis     a. ultrasound 03/2015: RICA 0000000 stenosis, LICA less than [id number] stenosis     Diet Order:  Diet renal with fluid restriction Fluid restriction:: 1200 mL Fluid; Room service appropriate?: Yes; Fluid consistency:: Thin   Energy Intake: recorded po intake 40% at breakfast yesterday 100% at dinner; no other intake  recorded. Pt receiving Ensure with meals, taking some of this per documentation  Skin:  Reviewed, no issues  Last BM:  07/02/15    Recent Labs Lab 07/02/15 0822 07/03/15 0426 07/04/15 0645  NA 143 140 140  K 5.0 4.0 4.9  CL 104 102 99*  CO2 23 29 31   BUN 83* 39* 52*  CREATININE 10.53* 5.77* 7.31*  CALCIUM 9.7 9.1 8.9  GLUCOSE 88 74 84   No phosphorus checked this admission but poorly controlled as outpatient per MD note  Glucose Profile: No results for input(s): GLUCAP in the last 72 hours. Meds: calcium acetate with meals, sensipar  Height:   Ht Readings from Last 1 Encounters:  07/02/15 5\' 6"  (1.676 m)    Weight: weight trend relatively stable per wt encounters  Wt Readings from Last 1 Encounters:  07/04/15 149 lb 11.1 oz (67.9 kg)    Wt Readings from Last 10 Encounters:  07/04/15 149 lb 11.1 oz (67.9 kg)  04/15/15 148 lb 2.4 oz (67.2 kg)  03/29/15 147 lb 8 oz (66.906 kg)  02/23/15 145 lb (65.772 kg)  09/15/14 145 lb (65.772 kg)    BMI:  Body mass index is 24.17 kg/(m^2).  LOW Care Level  Kerman Passey MS, [place], LDN [phone] Pager  [phone] Weekend/On-Call Pager

## 2015-07-04 NOTE — Progress Notes (Signed)
[name] NAME: Toni Carlson    MR#:  [id number]  DATE OF BIRTH:  04/17/1946  SUBJECTIVE:  CHIEF COMPLAINT:   Chief Complaint  Patient presents with  . Weakness  . Leg Swelling   Still both feet pain, seemed to be more comfortable today. Seen during hemodialysis. REVIEW OF SYSTEMS:  CONSTITUTIONAL: No fever, has weakness.  EYES: No blurred or double vision.  EARS, NOSE, AND THROAT: No tinnitus or ear pain.  RESPIRATORY: No cough, shortness of breath, wheezing or hemoptysis.  CARDIOVASCULAR: No chest pain, orthopnea, edema.  GASTROINTESTINAL: No nausea, vomiting, diarrhea or abdominal pain. No melena or bloody stool. GENITOURINARY: No dysuria, hematuria.  ENDOCRINE: No polyuria, nocturia,  HEMATOLOGY: No anemia, easy bruising or bleeding SKIN: No rash or lesion. MUSCULOSKELETAL: both feet pain. NEUROLOGIC: No tingling, numbness, weakness.  PSYCHIATRY: No anxiety or depression.   DRUG ALLERGIES:  No Known Allergies  VITALS:  Blood pressure 139/51, pulse 60, temperature 98.8 F (37.1 C), temperature source Oral, resp. rate 16, height 5\' 6"  (1.676 m), weight 67.9 kg (149 lb 11.1 oz), SpO2 100 %.  PHYSICAL EXAMINATION:  GENERAL:  70 y.o.-year-old patient lying in the bed with no acute distress.  EYES: Pupils equal, round, reactive to light and accommodation. No scleral icterus. Extraocular muscles intact.  HEENT: Head atraumatic, normocephalic. Oropharynx and nasopharynx clear.  NECK:  Supple, no jugular venous distention. No thyroid enlargement, no tenderness.  LUNGS: Normal breath sounds bilaterally, no wheezing, rales,rhonchi or crepitation. No use of accessory muscles of respiration.  CARDIOVASCULAR: S1, S2 normal. No murmurs, rubs, or gallops.  ABDOMEN: Soft, nontender, mild distention. Bowel sounds present. No organomegaly or mass.  EXTREMITIES: trace pedal edema, no pedal pulses, no cyanosis, or clubbing.   NEUROLOGIC: Cranial nerves II through XII are intact. Muscle strength 4/5 in all extremities. Sensation intact. Gait not checked.  PSYCHIATRIC: The patient is alert and oriented x 3.  SKIN: No obvious rash, lesion, or ulcer.    LABORATORY PANEL:   CBC  Recent Labs Lab 07/04/15 0645  WBC 5.6  HGB 8.8*  HCT 27.1*  PLT 135*   ------------------------------------------------------------------------------------------------------------------  Chemistries   Recent Labs Lab 07/02/15 0822  07/04/15 0645  NA 143  < > 140  K 5.0  < > 4.9  CL 104  < > 99*  CO2 23  < > 31  GLUCOSE 88  < > 84  BUN 83*  < > 52*  CREATININE 10.53*  < > 7.31*  CALCIUM 9.7  < > 8.9  AST 18  --   --   ALT 15  --   --   ALKPHOS 245*  --   --   BILITOT 0.8  --   --   < > = values in this interval not displayed. ------------------------------------------------------------------------------------------------------------------  Cardiac Enzymes  Recent Labs Lab 07/03/15 0426  TROPONINI 0.32*   ------------------------------------------------------------------------------------------------------------------  RADIOLOGY:  No results found.  EKG:   Orders placed or performed during the hospital encounter of 07/02/15  . ED EKG  . ED EKG    ASSESSMENT AND PLAN:   Toni Carlson is a 70 y.o. female with a known history of end-stage renal disease on Tuesday Thursday Saturday dialysis, chronic diastolic CHF, chronic anemia with recent admission in November 2016 for GI bleed requiring 3 units of packed RBC transfusion, history of ischemic right MCA stroke, atrial fibrillation not on anticoagulation due to GI bleed presents to the hospital  from home secondary to worsening left foot pain and swelling and also missed hemodialysis for the last 2 sessions.  #1 left foot pain, suspected due to peripheral arterial disease. Some swelling noted as well. Known history of arthritis. Foot x-ray: No acute  abnormalities.  Poor dorsalis pedis pulses. -Also history of A. fib. Started on IV heparin drip, now discontinued. -Pain medications as needed. Per Dr. Lucky Cowboy, would consider an angiogram, with her timing of symptoms would not do this emergently. Continue dual antiplatelet therapy with ASA and Plavix and continue statin use.  .   #2 end-stage renal disease on hemodialysis-on Tuesday Thursday Saturday schedule. Missed her 2 dialysis sessions. Continue HD, last session today, 18th of February.  #3 elevated troponin-likely demand ischemia and also from her renal failure due to poor renal clearance. -Last echocardiogram in November 2016 showing LVH, normal ejection fraction and diastolic dysfunction. Patient has no chest pains  #4 atrial fibrillation-off of warfarin due to GI bleed. Rate controlled, currently on Coreg  #5 anemia of chronic disease due to end-stage renal disease. Baseline hemoglobin is around 8. Admission at the end of November requiring transfusion after GI bleed. Warfarin discontinued then. Hemoglobin is 7.0. Status post 1 unit PRBC transfusion, Hb has improved to 9.0/8.8. No bleeding was noted, follow hemoglobin level periodically.  #6 recent CVA in November 2016-known history of A. fib and had stroke. However Coumadin has been discontinued for the last couple of months due to acute GI bleed requiring transfusion recently. Resumed Aspirin, Continue statin  #7 hypertension-continue home medications-patient is on losartan, carvedilol, Norvasc and hydralazine, patient's blood pressure is relatively well controlled  #8 DVT prophylaxis-change to heparin SQ.  Per Physical therapy consult, SNF placement.  Discussed with Dr. Juleen China. All the records are reviewed and case discussed with Care Management/Social Workerr. Management plans discussed with the patient, family and they are in agreement.  CODE STATUS full code.  TOTAL TIME TAKING CARE OF THIS PATIENT: 40 minutes.      Theodoro Grist M.D on 07/04/2015 at 1:41 PM  Between 7am to 6pm - Pager - [phone]  After 6pm go to [email or web address] - password [email or web address] [facility]  Office  [phone]  CC: Primary care physician; Murlean Iba, MD

## 2015-07-04 NOTE — Progress Notes (Signed)
[facility]  ROUNDING NOTE   Subjective:   Seen and examined on hemodialysis treatment. Tolerating treatment well. UF of 1 litre  PRBC transfusion yesterday  Some nausea this morning  Worked with physical therapy yesterday, unable to ambulate   Objective:  Vital signs in last 24 hours:  Temp:  [98.2 F (36.8 C)-99.1 F (37.3 C)] 98.8 F (37.1 C) (02/18 1020) Pulse Rate:  [49-70] 60 (02/18 0800) Resp:  [16-22] 16 (02/18 0800) BP: (131-155)/(49-69) 139/51 mmHg (02/18 0800) SpO2:  [94 %-100 %] 100 % (02/18 0800)  Weight change:  Filed Weights   07/02/15 1643 07/02/15 1729 07/03/15 0514  Weight: 62.5 kg (137 lb 12.6 oz) 64.456 kg (142 lb 1.6 oz) 65 kg (143 lb 4.8 oz)    Intake/Output: I/O last 3 completed shifts: In: [id number] [P.O.:240; I.V.:168; Blood:639] Out: 0    Intake/Output this shift:     Physical Exam: General: NAD, laying in bed  Head: Normocephalic, atraumatic. Moist oral mucosal membranes  Eyes: Anicteric, PERRL  Neck: Supple, trachea midline  Lungs:  Clear to auscultation  Heart: Regular rate and rhythm  Abdomen:  Soft, nontender, +vental hernia  Extremities: no peripheral edema.  Neurologic: Nonfocal, moving all four extremities  Skin: No lesions  Access: Left arm AVF    Basic Metabolic Panel:  Recent Labs Lab 07/02/15 0822 07/03/15 0426 07/04/15 0645  NA 143 140 140  K 5.0 4.0 4.9  CL 104 102 99*  CO2 23 29 31   GLUCOSE 88 74 84  BUN 83* 39* 52*  CREATININE 10.53* 5.77* 7.31*  CALCIUM 9.7 9.1 8.9    Liver Function Tests:  Recent Labs Lab 07/02/15 0822  AST 18  ALT 15  ALKPHOS 245*  BILITOT 0.8  PROT 7.0  ALBUMIN 3.5   No results for input(s): LIPASE, AMYLASE in the last 168 hours. No results for input(s): AMMONIA in the last 168 hours.  CBC:  Recent Labs Lab 07/02/15 0822 07/03/15 0426 07/03/15 1624 07/04/15 0645  WBC 7.1 5.5  --  5.6  NEUTROABS 6.2  --   --   --   HGB 7.9* 7.0* 9.0* 8.8*  HCT 25.6*  22.4* 28.1* 27.1*  MCV 94.6 93.0  --  92.2  PLT 186 149*  --  135*    Cardiac Enzymes:  Recent Labs Lab 07/02/15 0822 07/02/15 1805 07/02/15 2309 07/03/15 0426  TROPONINI 0.40* 0.30* 0.33* 0.32*    BNP: Invalid input(s): POCBNP  CBG: No results for input(s): GLUCAP in the last 168 hours.  Microbiology: Results for orders placed or performed during the hospital encounter of 07/02/15  MRSA PCR Screening     Status: None   Collection Time: 07/02/15  5:50 PM  Result Value Ref Range Status   MRSA by PCR NEGATIVE NEGATIVE Final    Comment:        The GeneXpert MRSA Assay (FDA approved for NASAL specimens only), is one component of a comprehensive MRSA colonization surveillance program. It is not intended to diagnose MRSA infection nor to guide or monitor treatment for MRSA infections.     Coagulation Studies:  Recent Labs  07/02/15 0817  LABPROT 15.3*  INR 1.19    Urinalysis: No results for input(s): COLORURINE, LABSPEC, PHURINE, GLUCOSEU, HGBUR, BILIRUBINUR, KETONESUR, PROTEINUR, UROBILINOGEN, NITRITE, LEUKOCYTESUR in the last 72 hours.  Invalid input(s): APPERANCEUR    Imaging: Dg Foot 2 Views Left  07/02/2015  CLINICAL DATA:  Worsening LEFT foot pain and swelling over past week, no known  injury, history end-stage renal disease on dialysis, CHF, former smoker, atrial fibrillation EXAM: LEFT FOOT - 2 VIEW COMPARISON:  None FINDINGS: Diffuse osseous demineralization. Soft tissue swelling diffusely. Joint spaces preserved. No acute fracture, dislocation, or bone destruction. Scattered small vessel vascular calcifications. IMPRESSION: Osseous demineralization and diffuse soft tissue swelling. No acute abnormalities. Electronically Signed   By: Lavonia Dana M.D.   On: 07/02/2015 11:34     Medications:     . amLODipine  10 mg Oral Daily  . aspirin  81 mg Oral Daily  . atorvastatin  40 mg Oral q1800  . calcium acetate (Phos Binder)  1,334 mg Oral TID WC  .  carvedilol  6.25 mg Oral BID WC  . cinacalcet  60 mg Oral Q supper  . clopidogrel  75 mg Oral Daily  . diclofenac sodium  2 g Topical QID  . feeding supplement (ENSURE ENLIVE)  237 mL Oral TID WC  . gabapentin  300 mg Oral QHS  . heparin subcutaneous  5,000 Units Subcutaneous 3 times per day  . hydrALAZINE  25 mg Oral TID  . losartan  100 mg Oral Daily  . pantoprazole  40 mg Oral Daily  . sodium chloride flush  3 mL Intravenous Q12H   acetaminophen, morphine injection, ondansetron (ZOFRAN) IV, oxyCODONE-acetaminophen, polyethylene glycol  Assessment/ Plan:  [name] is a 70 y.o. black female with end stage renal disease on hemodialysis, GERD, anemia, atrial fibrillation, CVA, coronary artery disease, pulmonary hypertension, carotid artery stenosis who presents with left foot pain.   TTS [facility] [facility].   1. End Stage Renal Disease: seen and examined on hemodialysis. Tolerating treatment well. UF of 1-1.5 litres - Continue TTS schedule.   2. Peripheral vascular disease: concern for ischemic leg.  - Appreciate vascular surgery, Dr. Lucky Cowboy. Procedure for next week.   3. Anemia of chronic kidney disease: status post 2 unit PRBC : hemoglobin improved to 8.8  - hold epo due to possible ischemia.  - IV iron given as outpatient.   4. Secondary Hyperparathyroidism: PTH 3772 with phosphorus of 8. Not well controlled.  - phoslyra and sensipar.  - serum phosphorus level for today pending.   5. Hypertension: well controlled.  - losartan, carvedilol and amlodipine.    LOS: 2 Slaton Reaser 2/18/201710:43 AM

## 2015-07-05 LAB — CBC
HEMATOCRIT: 25.8 % — AB (ref 35.0–47.0)
HEMOGLOBIN: 8.3 g/dL — AB (ref 12.0–16.0)
MCH: 30.2 pg (ref 26.0–34.0)
MCHC: 32.3 g/dL (ref 32.0–36.0)
MCV: 93.5 fL (ref 80.0–100.0)
Platelets: 117 10*3/uL — ABNORMAL LOW (ref 150–440)
RBC: 2.76 MIL/uL — AB (ref 3.80–5.20)
RDW: 18.6 % — ABNORMAL HIGH (ref 11.5–14.5)
WBC: 3.5 10*3/uL — ABNORMAL LOW (ref 3.6–11.0)

## 2015-07-05 NOTE — Progress Notes (Signed)
[facility]  ROUNDING NOTE   Subjective:   Hemodialysis yesterday. Tolerated treatment well. UF of 1 litre  Continues to complain of bilateral lower extremity pain  Objective:  Vital signs in last 24 hours:  Temp:  [98.3 F (36.8 C)-101 F (38.3 C)] 99.1 F (37.3 C) (02/19 0551) Pulse Rate:  [47-68] 47 (02/19 0551) Resp:  [18-22] 18 (02/19 0551) BP: (129-163)/(35-62) 129/35 mmHg (02/19 0551) SpO2:  [91 %-94 %] 94 % (02/19 0551) Weight:  [66.361 kg (146 lb 4.8 oz)-67.9 kg (149 lb 11.1 oz)] 66.361 kg (146 lb 4.8 oz) (02/19 0544)  Weight change:  Filed Weights   07/03/15 0514 07/04/15 1020 07/05/15 0544  Weight: 65 kg (143 lb 4.8 oz) 67.9 kg (149 lb 11.1 oz) 66.361 kg (146 lb 4.8 oz)    Intake/Output: I/O last 3 completed shifts: In: -  Out: 1000 [Other:1000]   Intake/Output this shift:     Physical Exam: General: NAD, laying in bed  Head: Normocephalic, atraumatic. Moist oral mucosal membranes  Eyes: Anicteric, PERRL  Neck: Supple, trachea midline  Lungs:  Clear to auscultation  Heart: Regular rate and rhythm  Abdomen:  Soft, nontender, +vental hernia  Extremities: no peripheral edema. Diminished pulses bilaterally dorsalis pedis  Neurologic: Nonfocal, moving all four extremities  Skin: No lesions  Access: Left arm AVF    Basic Metabolic Panel:  Recent Labs Lab 07/02/15 0822 07/03/15 0426 07/04/15 0645  NA 143 140 140  K 5.0 4.0 4.9  CL 104 102 99*  CO2 23 29 31   GLUCOSE 88 74 84  BUN 83* 39* 52*  CREATININE 10.53* 5.77* 7.31*  CALCIUM 9.7 9.1 8.9    Liver Function Tests:  Recent Labs Lab 07/02/15 0822  AST 18  ALT 15  ALKPHOS 245*  BILITOT 0.8  PROT 7.0  ALBUMIN 3.5   No results for input(s): LIPASE, AMYLASE in the last 168 hours. No results for input(s): AMMONIA in the last 168 hours.  CBC:  Recent Labs Lab 07/02/15 0822 07/03/15 0426 07/03/15 1624 07/04/15 0645 07/05/15 0530  WBC 7.1 5.5  --  5.6 3.5*  NEUTROABS  6.2  --   --   --   --   HGB 7.9* 7.0* 9.0* 8.8* 8.3*  HCT 25.6* 22.4* 28.1* 27.1* 25.8*  MCV 94.6 93.0  --  92.2 93.5  PLT 186 149*  --  135* 117*    Cardiac Enzymes:  Recent Labs Lab 07/02/15 0822 07/02/15 1805 07/02/15 2309 07/03/15 0426  TROPONINI 0.40* 0.30* 0.33* 0.32*    BNP: Invalid input(s): POCBNP  CBG: No results for input(s): GLUCAP in the last 168 hours.  Microbiology: Results for orders placed or performed during the hospital encounter of 07/02/15  MRSA PCR Screening     Status: None   Collection Time: 07/02/15  5:50 PM  Result Value Ref Range Status   MRSA by PCR NEGATIVE NEGATIVE Final    Comment:        The GeneXpert MRSA Assay (FDA approved for NASAL specimens only), is one component of a comprehensive MRSA colonization surveillance program. It is not intended to diagnose MRSA infection nor to guide or monitor treatment for MRSA infections.     Coagulation Studies: No results for input(s): LABPROT, INR in the last 72 hours.  Urinalysis: No results for input(s): COLORURINE, LABSPEC, PHURINE, GLUCOSEU, HGBUR, BILIRUBINUR, KETONESUR, PROTEINUR, UROBILINOGEN, NITRITE, LEUKOCYTESUR in the last 72 hours.  Invalid input(s): APPERANCEUR    Imaging: No results found.  Medications:     . amLODipine  10 mg Oral Daily  . aspirin  81 mg Oral Daily  . atorvastatin  40 mg Oral q1800  . calcium acetate (Phos Binder)  1,334 mg Oral TID WC  . carvedilol  6.25 mg Oral BID WC  . cinacalcet  60 mg Oral Q supper  . clopidogrel  75 mg Oral Daily  . diclofenac sodium  2 g Topical QID  . feeding supplement (ENSURE ENLIVE)  237 mL Oral TID WC  . gabapentin  300 mg Oral QHS  . heparin subcutaneous  5,000 Units Subcutaneous 3 times per day  . hydrALAZINE  25 mg Oral TID  . losartan  100 mg Oral Daily  . pantoprazole  40 mg Oral Daily  . sodium chloride flush  3 mL Intravenous Q12H   acetaminophen, morphine injection, ondansetron (ZOFRAN) IV,  oxyCODONE-acetaminophen, polyethylene glycol  Assessment/ Plan:  [name] is a 70 y.o. black female with end stage renal disease on hemodialysis, GERD, anemia, atrial fibrillation, CVA, coronary artery disease, pulmonary hypertension, carotid artery stenosis who presents with left foot pain.   TTS [facility].   1. End Stage Renal Disease: seen and examined on hemodialysis. Tolerated treatment well. UF of 1 litre - Continue TTS schedule.   2. Peripheral vascular disease: concern for ischemic leg.  - Appreciate vascular surgery, Dr. Lucky Cowboy. Procedure for next week.   3. Anemia of chronic kidney disease: status post 2 unit PRBC on 2/17 : hemoglobin  8.3 - hold epo due to possible ischemia.  - IV iron given as outpatient.   4. Secondary Hyperparathyroidism: PTH 3772 with phosphorus of 8. Not well controlled.  - phoslyra and sensipar.   5. Hypertension: well controlled.  - losartan, carvedilol and amlodipine.    LOS: [place], [place] 2/19/20179:37 AM

## 2015-07-05 NOTE — Progress Notes (Signed)
Patient rested quietly tonight and pain managed with PRN morphine. A&Ox4, VSS, and SB on tele. Nursing staff will continue to monitor. Earleen Reaper, RN

## 2015-07-05 NOTE — Progress Notes (Signed)
Toni Carlson at [place] NAME: Toni Carlson    MR#:  [id number]  DATE OF BIRTH:  [birth year]  SUBJECTIVE:  CHIEF COMPLAINT:   Chief Complaint  Patient presents with  . Weakness  . Leg Swelling   Still both feet pain. Modalities EC with 1 L ultrafiltration. Patient feels good today, still has significant pain intermittently for which she is receiving pain medications.  REVIEW OF SYSTEMS:  CONSTITUTIONAL: No fever, has weakness.  EYES: No blurred or double vision.  EARS, NOSE, AND THROAT: No tinnitus or ear pain.  RESPIRATORY: No cough, shortness of breath, wheezing or hemoptysis.  CARDIOVASCULAR: No chest pain, orthopnea, edema.  GASTROINTESTINAL: No nausea, vomiting, diarrhea or abdominal pain. No melena or bloody stool. GENITOURINARY: No dysuria, hematuria.  ENDOCRINE: No polyuria, nocturia,  HEMATOLOGY: No anemia, easy bruising or bleeding SKIN: No rash or lesion. MUSCULOSKELETAL: both feet pain. NEUROLOGIC: No tingling, numbness, weakness.  PSYCHIATRY: No anxiety or depression.   DRUG ALLERGIES:  No Known Allergies  VITALS:  Blood pressure 125/86, pulse 52, temperature 98.9 F (37.2 C), temperature source Oral, resp. rate 16, height 5\' 6"  (1.676 m), weight 66.361 kg (146 lb 4.8 oz), SpO2 97 %.  PHYSICAL EXAMINATION:  GENERAL:  70 y.o.-year-old patient lying in the bed with no acute distress, sleepy.  EYES: Pupils equal, round, reactive to light and accommodation. No scleral icterus. Extraocular muscles intact.  HEENT: Head atraumatic, normocephalic. Oropharynx and nasopharynx clear.  NECK:  Supple, no jugular venous distention. No thyroid enlargement, no tenderness.  LUNGS: Normal breath sounds bilaterally, no wheezing, rales,rhonchi or crepitation. No use of accessory muscles of respiration.  CARDIOVASCULAR: S1, S2 normal. No murmurs, rubs, or gallops.  ABDOMEN: Soft, nontender, mild distention. Bowel sounds present. No  organomegaly or mass.  EXTREMITIES: trace pedal edema, no pedal pulses, no cyanosis, or clubbing.  NEUROLOGIC: Cranial nerves II through XII are intact. Muscle strength 4/5 in all extremities. Sensation intact. Gait not checked.  PSYCHIATRIC: The patient is alert and oriented x 3.  SKIN: No obvious rash, lesion, or ulcer.    LABORATORY PANEL:   CBC  Recent Labs Lab 07/05/15 0530  WBC 3.5*  HGB 8.3*  HCT 25.8*  PLT 117*   ------------------------------------------------------------------------------------------------------------------  Chemistries   Recent Labs Lab 07/02/15 0822  07/04/15 0645  NA 143  < > 140  K 5.0  < > 4.9  CL 104  < > 99*  CO2 23  < > 31  GLUCOSE 88  < > 84  BUN 83*  < > 52*  CREATININE 10.53*  < > 7.31*  CALCIUM 9.7  < > 8.9  AST 18  --   --   ALT 15  --   --   ALKPHOS 245*  --   --   BILITOT 0.8  --   --   < > = values in this interval not displayed. ------------------------------------------------------------------------------------------------------------------  Cardiac Enzymes  Recent Labs Lab 07/03/15 0426  TROPONINI 0.32*   ------------------------------------------------------------------------------------------------------------------  RADIOLOGY:  No results found.  EKG:   Orders placed or performed during the hospital encounter of 07/02/15  . ED EKG  . ED EKG    ASSESSMENT AND PLAN:   Toni Carlson is a 70 y.o. female with a known history of end-stage renal disease on Tuesday Thursday Saturday dialysis, chronic diastolic CHF, chronic anemia with recent admission in November 2016 for GI bleed requiring 3 units of packed RBC transfusion, history of ischemic right  MCA stroke, atrial fibrillation not on anticoagulation due to GI bleed presents to the hospital from home secondary to worsening left foot pain and swelling and also missed hemodialysis for the last 2 sessions.  #1 left foot pain, suspected due to peripheral arterial  disease. Some swelling noted as well. Known history of arthritis. Foot x-ray: No acute abnormalities.  Poor dorsalis pedis pulses. -Also history of A. fib. Started on IV heparin drip, now discontinued. -Pain medications as needed. Per Dr. Lucky Cowboy, likely angiogram early next week.  Continue dual antiplatelet therapy with ASA and Plavix and continue statin use.  . #2 end-stage renal disease on hemodialysis-on Tuesday Thursday Saturday schedule. Missed her 2 dialysis sessions. Continue HD, last session 18th of February.  #3 elevated troponin-likely demand ischemia and also from her renal failure due to poor renal clearance. -Last echocardiogram in November 2016 showing LVH, normal ejection fraction and diastolic dysfunction. Patient has no chest pains  #4 atrial fibrillation-off of warfarin due to GI bleed. Rate controlled, currently on Coreg  #5 anemia of chronic disease due to end-stage renal disease. Baseline hemoglobin is around 8. Admission at the end of November requiring transfusion after GI bleed. Warfarin discontinued then. Hemoglobin is 7.0 on admission, patient received 1 unit PRBC transfusion, Hb has improved to 9.0/8.8 yesterday, 8.3 today. No bleeding was noted, follow hemoglobin level periodically, no Epogen per nephrologist.  #6 recent CVA in November 2016-known history of A. fib and had stroke. However Coumadin has been discontinued for the last couple of months due to acute GI bleed requiring transfusion recently. Resumed Aspirin, Continue statin  #7 hypertension-continue home medications-patient is on losartan, carvedilol, Norvasc and hydralazine, patient's blood pressure is well controlled  #8 DVT prophylaxis- heparin SQ.  #9. Generalized weakness, patient was seen by physical therapist,  SNF placement was recommended.   All the records are reviewed and case discussed with Care Management/Social Workerr. Management plans discussed with the patient, family and they are in  agreement.  CODE STATUS full code.  TOTAL TIME TAKING CARE OF THIS PATIENT: 30 minutes.     Theodoro Grist M.D on 07/05/2015 at 2:58 PM  Between 7am to 6pm - Pager - [phone]  After 6pm go to [email or web address] - password [email or web address] [facility]  Office  [phone]  CC: Primary care physician; Murlean Iba, MD

## 2015-07-05 NOTE — Progress Notes (Signed)
Clinical Social Worker (CSW) attempted to call patient's daughter Toni Carlson [phone] to present bed offers. Daughter did not answer and a voicemail was left. CSW will continue to follow and assist as needed.   Blima Rich, LCSW [phone]

## 2015-07-06 LAB — CBC
HEMATOCRIT: 24.7 % — AB (ref 35.0–47.0)
Hemoglobin: 7.9 g/dL — ABNORMAL LOW (ref 12.0–16.0)
MCH: 29.5 pg (ref 26.0–34.0)
MCHC: 31.8 g/dL — ABNORMAL LOW (ref 32.0–36.0)
MCV: 92.9 fL (ref 80.0–100.0)
Platelets: 106 10*3/uL — ABNORMAL LOW (ref 150–440)
RBC: 2.66 MIL/uL — ABNORMAL LOW (ref 3.80–5.20)
RDW: 18.1 % — AB (ref 11.5–14.5)
WBC: 2.9 10*3/uL — AB (ref 3.6–11.0)

## 2015-07-06 MED ORDER — POLYETHYLENE GLYCOL 3350 17 G PO PACK
17.0000 g | PACK | Freq: Once | ORAL | Status: AC
Start: 2015-07-06 — End: 2015-07-06
  Administered 2015-07-06: 17 g via ORAL
  Filled 2015-07-06: qty 1

## 2015-07-06 MED ORDER — CAPSAICIN 0.025 % EX CREA
TOPICAL_CREAM | Freq: Two times a day (BID) | CUTANEOUS | Status: DC
  Administered 2015-07-06 – 2015-07-08 (×5): via TOPICAL
  Filled 2015-07-06: qty 56.6

## 2015-07-06 MED ORDER — GABAPENTIN 300 MG PO CAPS
300.0000 mg | ORAL_CAPSULE | Freq: Three times a day (TID) | ORAL | Status: DC
  Administered 2015-07-06 – 2015-07-07 (×4): 300 mg via ORAL
  Filled 2015-07-06 (×4): qty 1

## 2015-07-06 NOTE — Care Management Important Message (Signed)
Important Message  Patient Details  Name: Toni Carlson MRN: [id number] Date of Birth: [birth year]   Medicare Important Message Given:  Yes    Juliann Pulse A Derric Dealmeida 07/06/2015, 10:47 AM

## 2015-07-06 NOTE — Progress Notes (Signed)
Subjective:  Patient continues to report pain in in her feet bilaterally which is worse at night She states that the cream helps some No shortness of breath   Objective:  Vital signs in last 24 hours:  Temp:  [97.5 F (36.4 C)-98 F (36.7 C)] 98 F (36.7 C) (02/20 1129) Pulse Rate:  [44-50] 44 (02/20 1129) Resp:  [16-20] 18 (02/20 1129) BP: (116-140)/(40-60) 133/56 mmHg (02/20 1129) SpO2:  [94 %-100 %] 98 % (02/20 1129)  Weight change:  Filed Weights   07/03/15 0514 07/04/15 1020 07/05/15 0544  Weight: 65 kg (143 lb 4.8 oz) 67.9 kg (149 lb 11.1 oz) 66.361 kg (146 lb 4.8 oz)    Intake/Output:    Intake/Output Summary (Last 24 hours) at 07/06/15 1346 Last data filed at 07/06/15 0740  Gross per 24 hour  Intake    240 ml  Output      0 ml  Net    240 ml     Physical Exam: General: No distress, laying in the bed  HEENT anicteric  Neck supple  Pulm/lungs Normal effort, clear  CVS/Heart No rub or gallop  Abdomen:  Soft, nontender, nondistended  Extremities: discoloration over lower legs, trace edema  Neurologic: Alert, oriented, bilateral lower legs tender to touch  Skin: Warm, dry  Access:        Basic Metabolic Panel:   Recent Labs Lab 07/02/15 0822 07/03/15 0426 07/04/15 0645  NA 143 140 140  K 5.0 4.0 4.9  CL 104 102 99*  CO2 23 29 31   GLUCOSE 88 74 84  BUN 83* 39* 52*  CREATININE 10.53* 5.77* 7.31*  CALCIUM 9.7 9.1 8.9     CBC:  Recent Labs Lab 07/02/15 0822 07/03/15 0426 07/03/15 1624 07/04/15 0645 07/05/15 0530 07/06/15 0455  WBC 7.1 5.5  --  5.6 3.5* 2.9*  NEUTROABS 6.2  --   --   --   --   --   HGB 7.9* 7.0* 9.0* 8.8* 8.3* 7.9*  HCT 25.6* 22.4* 28.1* 27.1* 25.8* 24.7*  MCV 94.6 93.0  --  92.2 93.5 92.9  PLT 186 149*  --  135* 117* 106*      Microbiology:  Recent Results (from the past 720 hour(s))  MRSA PCR Screening     Status: None   Collection Time: 07/02/15  5:50 PM  Result Value Ref Range Status   MRSA by PCR  NEGATIVE NEGATIVE Final    Comment:        The GeneXpert MRSA Assay (FDA approved for NASAL specimens only), is one component of a comprehensive MRSA colonization surveillance program. It is not intended to diagnose MRSA infection nor to guide or monitor treatment for MRSA infections.     Coagulation Studies: No results for input(s): LABPROT, INR in the last 72 hours.  Urinalysis: No results for input(s): COLORURINE, LABSPEC, PHURINE, GLUCOSEU, HGBUR, BILIRUBINUR, KETONESUR, PROTEINUR, UROBILINOGEN, NITRITE, LEUKOCYTESUR in the last 72 hours.  Invalid input(s): APPERANCEUR    Imaging: No results found.   Medications:     . amLODipine  10 mg Oral Daily  . aspirin  81 mg Oral Daily  . atorvastatin  40 mg Oral q1800  . calcium acetate (Phos Binder)  1,334 mg Oral TID WC  . capsaicin   Topical BID  . carvedilol  6.25 mg Oral BID WC  . cinacalcet  60 mg Oral Q supper  . clopidogrel  75 mg Oral Daily  . diclofenac sodium  2 g Topical QID  .  feeding supplement (ENSURE ENLIVE)  237 mL Oral TID WC  . gabapentin  300 mg Oral TID  . heparin subcutaneous  5,000 Units Subcutaneous 3 times per day  . hydrALAZINE  25 mg Oral TID  . losartan  100 mg Oral Daily  . pantoprazole  40 mg Oral Daily  . sodium chloride flush  3 mL Intravenous Q12H   acetaminophen, morphine injection, ondansetron (ZOFRAN) IV, oxyCODONE-acetaminophen, polyethylene glycol  Assessment/ Plan:  70 y.o. female with end stage renal disease on hemodialysis, GERD, anemia, atrial fibrillation, CVA, coronary artery disease, pulmonary hypertension, carotid artery stenosis    TTS [facility]   1. End Stage Renal Disease:   - Continue TTS schedule.   2. Peripheral vascular disease: concern for ischemic leg.  - Appreciate vascular surgery, Dr. Lucky Cowboy. - angiogram planned - Neurology consult to evaluate for peripheral neuropathy  3. Anemia of chronic kidney disease:   - hold epo due to possible  ischemia.  - IV iron given as outpatient.   4. Secondary Hyperparathyroidism: PTH 3772 with phosphorus of 8. Not well controlled.  - phoslyra and sensipar.  - ddx includes calciphylaxis  5. Hypertension:  acceptable control - losartan, carvedilol and amlodipine.     LOS: 4 Alexine Pilant 2/20/20171:46 PM

## 2015-07-06 NOTE — Progress Notes (Signed)
CSW received a call back from patient's daughter patient's daughter Toni Carlson [phone] . Bed offer accepted from [facility]. Patient is in agreement with plan to discharge to [facility] when medically stable. CSW will continue to follow and assist.   Ernest Pine, MSW, [place] Work Department [phone]

## 2015-07-06 NOTE — Progress Notes (Addendum)
Physical Therapy Treatment Patient Details Name: Toni Carlson MRN: [id number] DOB: 1945-11-27 Today's Date: 07/06/2015    History of Present Illness Pt admitted L foot pain with complaints of weakness/B LE swelling. Pt also with elevated troponin, however is downtrending at this time. Pt with history of ESRD, GI bleed from Nov 2016, HTN, and R MCA CVA. Per vascular consult, pt may have procedure next week. RN cleared pt to work with therapy this date.     PT Comments    Pt is making limited progress towards goals secondary to unrelenting L foot pain. RN notified and bringing meds for pt while therapist ended session. Pt reports she had just tried to get to [facility] in the night and was unable to weight bear through L LE. She refuses all mobility attempts at this time, however is agreeable to there-ex. Pt presents in B LE windswept position towards L side.  Follow Up Recommendations  SNF     Equipment Recommendations       Recommendations for Other Services       Precautions / Restrictions Precautions Precautions: Fall Restrictions Weight Bearing Restrictions: No    Mobility  Bed Mobility               General bed mobility comments: refused all mobility at this time secondary to foot pain. Did assist with min assist for scooting up towards [facility] for positioning  Transfers                    Ambulation/Gait                 Stairs            Wheelchair Mobility    Modified Rankin (Stroke Patients Only)       Balance                                    Cognition Arousal/Alertness: Awake/alert Behavior During Therapy: WFL for tasks assessed/performed Overall Cognitive Status: Within Functional Limits for tasks assessed                      Exercises Other Exercises Other Exercises: Supine ther-ex performed including B LE SLRs, hip abd/add, knee flexion, quad sets, ankle pumps and B UE shoulder flexion. All ther-ex  performed x 10 reps with cga on R LE and min assist for correct technique on L LE.    General Comments        Pertinent Vitals/Pain Pain Assessment: Faces Faces Pain Scale: Hurts even more Pain Location: L plantar surface Pain Descriptors / Indicators: Cramping;Discomfort;Dull Pain Intervention(s): Limited activity within patient's tolerance;Patient requesting pain meds-RN notified;RN gave pain meds during session    Home Living                      Prior Function            PT Goals (current goals can now be found in the care plan section) Acute Rehab PT Goals Patient Stated Goal: to get stronger PT Goal Formulation: With patient Time For Goal Achievement: 07/17/15 Potential to Achieve Goals: Good Progress towards PT goals: Progressing toward goals    Frequency  Min 2X/week    PT Plan Current plan remains appropriate    Co-evaluation             End of Session  Activity Tolerance: Patient limited by pain Patient left: in bed;with bed alarm set     Time: [id number] PT Time Calculation (min) (ACUTE ONLY): 10 min  Charges:  $Therapeutic Exercise: 8-22 mins                    G Codes:      Rykker Coviello 07/22/15, 11:58 AM Greggory Stallion, PT, DPT [phone]

## 2015-07-06 NOTE — Progress Notes (Signed)
Patient rested quietly tonight. A&Ox4, VSS, and SB on tele. Comfortable, but still complaining of bilateral foot pain, left worse than right. Nursing staff will continue to monitor. Earleen Reaper, RN

## 2015-07-06 NOTE — Care Management (Signed)
Has been accepted at [facility]. Neurology consult for peripheral neuropathy. Concern for ischemic leg. Angiogram planned for today. Pancytothemia, oncology consult. Following progression.

## 2015-07-06 NOTE — Progress Notes (Signed)
[facility] [place] NAME: Toni Carlson    MR#:  [id number]  DATE OF BIRTH:  [birth year]  SUBJECTIVE:  CHIEF COMPLAINT:   Chief Complaint  Patient presents with  . Weakness  . Leg Swelling   Still pain in bilateral legs and feet , worse at night.  Patient feels good today overall, pain is insignificant. Patient was seen by nephrologist, who recommended to advance gabapentin due to concerns of neuropathy, neurology consultation is obtained to evaluate for peripheral neuropathy.  REVIEW OF SYSTEMS:  CONSTITUTIONAL: No fever, has weakness.  EYES: No blurred or double vision.  EARS, NOSE, AND THROAT: No tinnitus or ear pain.  RESPIRATORY: No cough, shortness of breath, wheezing or hemoptysis.  CARDIOVASCULAR: No chest pain, orthopnea, edema.  GASTROINTESTINAL: No nausea, vomiting, diarrhea or abdominal pain. No melena or bloody stool. GENITOURINARY: No dysuria, hematuria.  ENDOCRINE: No polyuria, nocturia,  HEMATOLOGY: No anemia, easy bruising or bleeding SKIN: No rash or lesion. MUSCULOSKELETAL: both feet pain. NEUROLOGIC: No tingling, numbness, weakness.  PSYCHIATRY: No anxiety or depression.   DRUG ALLERGIES:  No Known Allergies  VITALS:  Blood pressure 133/56, pulse 44, temperature 98 F (36.7 C), temperature source Oral, resp. rate 18, height 5\' 6"  (1.676 m), weight 66.361 kg (146 lb 4.8 oz), SpO2 98 %.  PHYSICAL EXAMINATION:  GENERAL:  70 y.o.-year-old patient lying in the bed with no acute distress, alert and comfortable  EYES: Pupils equal, round, reactive to light and accommodation. No scleral icterus. Extraocular muscles intact.  HEENT: Head atraumatic, normocephalic. Oropharynx and nasopharynx clear.  NECK:  Supple, no jugular venous distention. No thyroid enlargement, no tenderness.  LUNGS: Normal breath sounds bilaterally, no wheezing, rales,rhonchi or crepitation. No use of accessory muscles of respiration.   CARDIOVASCULAR: S1, S2 normal. No murmurs, rubs, or gallops.  ABDOMEN: Soft, nontender, mild distention. Bowel sounds present. No organomegaly or mass.  EXTREMITIES: trace pedal edema, no pedal pulses, no cyanosis, or clubbing.  NEUROLOGIC: Cranial nerves II through XII are intact. Muscle strength 4/5 in all extremities. Sensation intact. Gait not checked.  PSYCHIATRIC: The patient is alert and oriented x 3.  SKIN: No obvious rash, lesion, or ulcer.    LABORATORY PANEL:   CBC  Recent Labs Lab 07/06/15 0455  WBC 2.9*  HGB 7.9*  HCT 24.7*  PLT 106*   ------------------------------------------------------------------------------------------------------------------  Chemistries   Recent Labs Lab 07/02/15 0822  07/04/15 0645  NA 143  < > 140  K 5.0  < > 4.9  CL 104  < > 99*  CO2 23  < > 31  GLUCOSE 88  < > 84  BUN 83*  < > 52*  CREATININE 10.53*  < > 7.31*  CALCIUM 9.7  < > 8.9  AST 18  --   --   ALT 15  --   --   ALKPHOS 245*  --   --   BILITOT 0.8  --   --   < > = values in this interval not displayed. ------------------------------------------------------------------------------------------------------------------  Cardiac Enzymes  Recent Labs Lab 07/03/15 0426  TROPONINI 0.32*   ------------------------------------------------------------------------------------------------------------------  RADIOLOGY:  No results found.  EKG:   Orders placed or performed during the hospital encounter of 07/02/15  . ED EKG  . ED EKG    ASSESSMENT AND PLAN:   Toni Carlson is a 70 y.o. female with a known history of end-stage renal disease on Tuesday Thursday Saturday dialysis, chronic diastolic CHF, chronic anemia  with recent admission in November 2016 for GI bleed requiring 3 units of packed RBC transfusion, history of ischemic right MCA stroke, atrial fibrillation not on anticoagulation due to GI bleed presents to the hospital from home secondary to worsening left foot  pain and swelling and also missed hemodialysis for the last 2 sessions.  #1 . Bilateral Lower extremity,  feet pain, suspected due to peripheral arterial disease, rule out peripheral neuropathy. Some swelling noted as well. Known history of arthritis. Foot x-ray: No acute abnormalities.  Poor dorsalis pedis pulses. -Also history of A. fib. Started on IV heparin drip, now discontinued. -Pain medications as needed, advanced gabapentin, neurology consultation is obtained. Per Dr. Lucky Cowboy, likely angiogram this week .  Continue dual antiplatelet therapy with ASA and Plavix and continue statin use.  . #2 end-stage renal disease on hemodialysis-on Tuesday Thursday Saturday schedule. Missed her 2 dialysis sessions. Continue HD, last session 18th of February, next is planned for tomorrow.  #3 elevated troponin-likely demand ischemia and also from her renal failure due to poor renal clearance. -Last echocardiogram in November 2016 showing LVH, normal ejection fraction and diastolic dysfunction. Patient has no chest pains  #4 atrial fibrillation-off of warfarin due to GI bleed. Rate is low, discontinue Coreg  #5 anemia of chronic disease due to end-stage renal disease. Baseline hemoglobin is around 8. Admission at the end of November requiring transfusion after GI bleed. Warfarin discontinued then. Hemoglobin is 7.0 on admission, patient received 1 unit PRBC transfusion, Hb has improved to 8.3 yesterday, 7.9 today. No bleeding was noted, follow hemoglobin level periodically, Epogen per nephrologist.  #6 recent CVA in November 2016-known history of A. fib and had stroke. However Coumadin has been discontinued for the last couple of months due to acute GI bleed requiring transfusion recently. Resumed Aspirin, Continue statin  #7 hypertension-continue home medications-patient is on losartan,  Norvasc , patient's blood pressure is well controlled  #8 DVT prophylaxis-holding heparin due to  thrombocytopenia  #9. Generalized weakness, patient was seen by physical therapist,  SNF placement was recommended.  #10. Pancytopenia, etiology is unclear, hold heparin, get oncologist involved for further recommendations. Get B12, ferritin, iron, TIBC levels   All the records are reviewed and case discussed with Care Management/Social Workerr. Management plans discussed with the patient, family and they are in agreement.  CODE STATUS full code.  TOTAL TIME TAKING CARE OF THIS PATIENT: 35 minutes.     Toni Carlson M.D on 07/06/2015 at 2:00 PM  Between 7am to 6pm - Pager - [phone]  After 6pm go to [email or web address] - password [email or web address] [facility]  Office  [phone]  CC: Primary care physician; Murlean Iba, MD

## 2015-07-06 NOTE — Progress Notes (Signed)
CSW called patient's daughter Orvan Falconer [phone] to inform her of patient's bed offers. CSW left voicemail. Awaiting phone call back. CSW will continue to follow and assist.   Ernest Pine, MSW, [place] Work Department [phone]

## 2015-07-07 DIAGNOSIS — N2581 Secondary hyperparathyroidism of renal origin: Secondary | ICD-10-CM

## 2015-07-07 DIAGNOSIS — D696 Thrombocytopenia, unspecified: Secondary | ICD-10-CM

## 2015-07-07 DIAGNOSIS — E1122 Type 2 diabetes mellitus with diabetic chronic kidney disease: Secondary | ICD-10-CM

## 2015-07-07 DIAGNOSIS — D649 Anemia, unspecified: Secondary | ICD-10-CM

## 2015-07-07 DIAGNOSIS — I2510 Atherosclerotic heart disease of native coronary artery without angina pectoris: Secondary | ICD-10-CM

## 2015-07-07 DIAGNOSIS — M79672 Pain in left foot: Secondary | ICD-10-CM

## 2015-07-07 DIAGNOSIS — I4891 Unspecified atrial fibrillation: Secondary | ICD-10-CM

## 2015-07-07 DIAGNOSIS — Z992 Dependence on renal dialysis: Secondary | ICD-10-CM

## 2015-07-07 DIAGNOSIS — N186 End stage renal disease: Secondary | ICD-10-CM

## 2015-07-07 DIAGNOSIS — R531 Weakness: Secondary | ICD-10-CM

## 2015-07-07 DIAGNOSIS — R600 Localized edema: Secondary | ICD-10-CM

## 2015-07-07 DIAGNOSIS — M79671 Pain in right foot: Secondary | ICD-10-CM

## 2015-07-07 DIAGNOSIS — Z7901 Long term (current) use of anticoagulants: Secondary | ICD-10-CM

## 2015-07-07 DIAGNOSIS — I120 Hypertensive chronic kidney disease with stage 5 chronic kidney disease or end stage renal disease: Secondary | ICD-10-CM

## 2015-07-07 DIAGNOSIS — I5032 Chronic diastolic (congestive) heart failure: Secondary | ICD-10-CM

## 2015-07-07 LAB — CBC
HEMATOCRIT: 23.3 % — AB (ref 35.0–47.0)
Hemoglobin: 7.4 g/dL — ABNORMAL LOW (ref 12.0–16.0)
MCH: 28.9 pg (ref 26.0–34.0)
MCHC: 31.7 g/dL — ABNORMAL LOW (ref 32.0–36.0)
MCV: 91.1 fL (ref 80.0–100.0)
Platelets: 105 10*3/uL — ABNORMAL LOW (ref 150–440)
RBC: 2.55 MIL/uL — AB (ref 3.80–5.20)
RDW: 18 % — ABNORMAL HIGH (ref 11.5–14.5)
WBC: 4.4 10*3/uL (ref 3.6–11.0)

## 2015-07-07 LAB — TSH: TSH: 2.479 u[IU]/mL (ref 0.350–4.500)

## 2015-07-07 LAB — RAPID HIV SCREEN (HIV 1/2 AB+AG)
HIV 1/2 Antibodies: NONREACTIVE
HIV-1 P24 ANTIGEN - HIV24: NONREACTIVE

## 2015-07-07 LAB — IRON AND TIBC
Iron: 26 ug/dL — ABNORMAL LOW (ref 28–170)
Saturation Ratios: 12 % (ref 10.4–31.8)
TIBC: 209 ug/dL — ABNORMAL LOW (ref 250–450)
UIBC: 183 ug/dL

## 2015-07-07 LAB — SEDIMENTATION RATE: SED RATE: 56 mm/h — AB (ref 0–30)

## 2015-07-07 LAB — FERRITIN: Ferritin: 371 ng/mL — ABNORMAL HIGH (ref 11–307)

## 2015-07-07 LAB — VITAMIN B12: Vitamin B-12: 509 pg/mL (ref 180–914)

## 2015-07-07 LAB — CK: Total CK: 60 U/L (ref 38–234)

## 2015-07-07 MED ORDER — PREGABALIN 25 MG PO CAPS
25.0000 mg | ORAL_CAPSULE | Freq: Two times a day (BID) | ORAL | Status: DC
  Administered 2015-07-07 – 2015-07-08 (×3): 25 mg via ORAL
  Filled 2015-07-07 (×3): qty 1

## 2015-07-07 MED ORDER — EPOETIN ALFA 10000 UNIT/ML IJ SOLN
10000.0000 [IU] | INTRAMUSCULAR | Status: DC
  Administered 2015-07-07: 10000 [IU] via INTRAVENOUS

## 2015-07-07 MED ORDER — SODIUM CHLORIDE 0.9 % IV SOLN
200.0000 mg | INTRAVENOUS | Status: DC
  Administered 2015-07-07: 200 mg via INTRAVENOUS
  Filled 2015-07-07: qty 10

## 2015-07-07 NOTE — Consult Note (Signed)
[place] @ [facility] Telephone:(336) [phone]  Fax:(336) [phone]     Toni Carlson OB: Jun 06, 1945  MR#: [id number]  XMI#:[id number]  Patient Care Team: Murlean Iba, MD as PCP - General (Internal Medicine)  CHIEF COMPLAINT:  Chief Complaint  Patient presents with  . Weakness  . Leg Swelling   pancytopenia   No history exists.    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 She has a history of anemia, receiving Procrit and iron infusions during hemodialysis. She also has a history of upper GI bleed in the setting of supratherapeutic INR likely from a gastric ulcer, which was detected on EGD in November 2016. Since admission she received 2 units of red blood cells with initial improvement of hemoglobin, however, over the past 3 days hemoglobin has been declining. She also was found to have significant drop in platelet count from 180,000 cells per microliter to 105,000 cells per microliter over the past 5 days. She initially was started on heparin 3, and platelet count started to decline immediately, so heparin was stopped 2 days later. There has been no significant bleeding detected during this admission. She has been complaining of painful neuropathy over the past several months. She has noticed shallow ulcers on several of the right toes over the past several months. She denies lower extremity edema, redness; denies chest pain, hemoptysis.  REVIEW OF SYSTEMS:   Review of Systems  All other systems reviewed and are negative.    PAST MEDICAL HISTORY: Past Medical History  Diagnosis Date  . Hypertension   . ESRD (end stage renal disease) ([place])     On Tue-Thur-Sat  dialysis  . Chronic diastolic CHF (congestive heart failure) ([place])     a. echo 03/2015: EF 60-65%, normal wall motion, diastolic parameters were c/w restrictive physiology, indicative of decreased LV diastolic compliance and/or increased LA pressure, mild AS, mild MR, left atrium was severely dilated, RA was severely dilated, PASP was severely increased at 90 mm Hg  . Anemia   . Hyperparathyroidism, secondary renal ([place])   . Coronary artery disease, non-occlusive     a. cardiac cath 2013  . H/O ischemic right MCA stroke     a. 03/2015; b. residual left-sided facial droop and slurred speech  . A-fib ([facility])     a. on warfarin  . Pulmonary HTN ([place])     as above  . Symptomatic bradycardia     a. history of symptomatic bradycardia; b. felt to be 2/2 metabolic abnormalities & required temp wire but no PPM, resolved with HD  . Carotid artery stenosis     a. ultrasound 03/2015: RICA 82-50% stenosis, LICA less than 03% stenosis    PAST SURGICAL HISTORY: Past Surgical History  Procedure Laterality Date  . Peripheral vascular catheterization N/A 02/23/2015    Procedure: A/V Shuntogram/Fistulagram;  Surgeon: Algernon Huxley, MD;  Location: [facility] [facility];  Service: Cardiovascular;  Laterality: N/A;  . Peripheral vascular catheterization N/A 02/23/2015    Procedure: A/V Shunt Intervention;  Surgeon: Algernon Huxley, MD;  Location: [place] [facility];  Service: Cardiovascular;  Laterality: N/A;  . Esophagogastroduodenoscopy (egd) with propofol N/A 04/14/2015    Procedure: ESOPHAGOGASTRODUODENOSCOPY (EGD) WITH PROPOFOL;  Surgeon: Lucilla Lame, MD;  Location: [facility];  Service: Endoscopy;  Laterality: N/A;    FAMILY HISTORY Family History  Problem Relation Age of Onset  . CAD Father   . Dementia Mother     ADVANCED DIRECTIVES:  No flowsheet data found.  HEALTH MAINTENANCE: Social History  Substance Use Topics  . Smoking status: Former Smoker -- 0.25 packs/day for 30 years    Types:  Cigarettes  . Smokeless tobacco: Never Used  . Alcohol Use: No     No Known Allergies  Current Facility-Administered Medications  Medication Dose Route Frequency Provider Last Rate Last Dose  . acetaminophen (TYLENOL) tablet 650 mg  650 mg Oral Q6H PRN Lenis Noon, RPH      . amLODipine (NORVASC) tablet 10 mg  10 mg Oral Daily Gladstone Lighter, MD   10 mg at 07/07/15 1033  . aspirin chewable tablet 81 mg  81 mg Oral Daily Demetrios Loll, MD   81 mg at 07/07/15 1033  . atorvastatin (LIPITOR) tablet 40 mg  40 mg Oral q1800 Gladstone Lighter, MD   40 mg at 07/06/15 1757  . calcium acetate (Phos Binder) (PHOSLYRA) 667 MG/5ML oral solution 1,334 mg  1,334 mg Oral TID WC Gladstone Lighter, MD   1,334 mg at 07/07/15 1034  . capsaicin (ZOSTRIX) 0.025 % cream   Topical BID Harmeet Singh, MD      . cinacalcet (SENSIPAR) tablet 60 mg  60 mg Oral Q supper Lavonia Dana, MD   60 mg at 07/06/15 1757  . clopidogrel (PLAVIX) tablet 75 mg  75 mg Oral Daily Demetrios Loll, MD   75 mg at 07/07/15 1033  . diclofenac sodium (VOLTAREN) 1 % transdermal gel 2 g  2 g Topical QID Gladstone Lighter, MD   2 g at 07/07/15 1034  . epoetin alfa (EPOGEN,PROCRIT) injection 10,000 Units  10,000 Units Intravenous Q T,Th,Sa-HD Harmeet Singh, MD      . feeding supplement (ENSURE ENLIVE) (ENSURE ENLIVE) liquid 237 mL  237 mL Oral TID WC Gladstone Lighter, MD   237 mL at 07/07/15 0800  . hydrALAZINE (APRESOLINE) tablet 25 mg  25 mg Oral TID Gladstone Lighter, MD   25 mg at 07/07/15 1033  . iron sucrose (VENOFER) 200 mg in sodium chloride 0.9 % 150 mL IVPB  200 mg Intravenous Q T,Th,Sa-HD Harmeet Singh, MD      . losartan (COZAAR) tablet 100 mg  100 mg Oral Daily Gladstone Lighter, MD   100 mg at 07/07/15 1033  . morphine 2 MG/ML injection 2 mg  2 mg Intravenous Q4H PRN Gladstone Lighter, MD   2 mg at 07/05/15 0544  . ondansetron (ZOFRAN) injection 4 mg  4 mg Intravenous Q6H PRN Gladstone Lighter, MD   4 mg at 07/04/15 0811  .  oxyCODONE-acetaminophen (PERCOCET/ROXICET) 5-325 MG per tablet 1 tablet  1 tablet Oral Q4H PRN Gladstone Lighter, MD   1 tablet at 07/04/15 1120  . pantoprazole (PROTONIX) EC tablet 40 mg  40 mg Oral Daily Gladstone Lighter, MD   40 mg at 07/07/15 1033  . polyethylene glycol (MIRALAX / GLYCOLAX) packet 17 g  17 g Oral Daily PRN Gladstone Lighter, MD      . pregabalin (LYRICA) capsule 25 mg  25 mg Oral BID Alexis Goodell, MD      . sodium chloride flush (NS) 0.9 % injection 3 mL  3 mL Intravenous Q12H Gladstone Lighter, MD   3 mL at 07/07/15 1034    OBJECTIVE:  Filed Vitals:   07/07/15 1109 07/07/15 1217  BP: 133/53 137/53  Pulse: 43 49  Temp: 97.9 F (36.6 C) 97.5 F (36.4 C)  Resp: 18      Body mass index is 25.28 kg/(m^2).    ECOG FS:3 - Symptomatic, >50% confined to bed  Physical Exam  Constitutional: She is oriented to person, place, and time and well-developed, well-nourished, and in no distress. No distress.  Ill-appearing older stated age 89 female  HENT:  Head: Normocephalic and atraumatic.  Right Ear: External ear normal.  Left Ear: External ear normal.  Mouth/Throat: Oropharynx is clear and moist.  Eyes: Conjunctivae are normal. Pupils are equal, round, and reactive to light. Right eye exhibits no discharge. Left eye exhibits no discharge. No scleral icterus.  Neck: Normal range of motion. Neck supple. No JVD present. No tracheal deviation present. No thyromegaly present.  Cardiovascular: Normal rate.  Exam reveals no gallop and no friction rub.   Murmur (3/6 systolic ejection murmur on apex) heard. Decreased pedal pulses bilaterally Irregularly irregular rhythm  Pulmonary/Chest: Effort normal and breath sounds normal. No stridor. No respiratory distress. She has no wheezes. She has no rales. She exhibits no tenderness.  Abdominal: Soft. Bowel sounds are normal. She exhibits no distension and no mass. There is no tenderness. There is no rebound and no  guarding.  Genitourinary:  Postponed  Musculoskeletal: Normal range of motion. She exhibits no edema or tenderness.  Lymphadenopathy:    She has no cervical adenopathy.  Neurological: She is alert and oriented to person, place, and time. She has normal reflexes. No cranial nerve deficit. She exhibits normal muscle tone. Gait normal. Coordination normal. GCS score is 15.  Skin: Skin is warm. No rash noted. She is not diaphoretic. No erythema. No pallor.  Right second and third toes-small shallow ulcers.  Psychiatric: Mood, memory, affect and judgment normal.  Vitals reviewed.    LAB RESULTS:  CBC Latest Ref Rng 07/07/2015 07/06/2015  WBC 3.6 - 11.0 K/uL 4.4 2.9(L)  Hemoglobin 12.0 - 16.0 g/dL 7.4(L) 7.9(L)  Hematocrit 35.0 - 47.0 % 23.3(L) 24.7(L)  Platelets 150 - 440 K/uL 105(L) 106(L)    Admission on 07/02/2015  Component Date Value Ref Range Status  . WBC 07/02/2015 7.1  3.6 - 11.0 K/uL Final  . RBC 07/02/2015 2.71* 3.80 - 5.20 MIL/uL Final  . Hemoglobin 07/02/2015 7.9* 12.0 - 16.0 g/dL Final  . HCT 07/02/2015 25.6* 35.0 - 47.0 % Final  . MCV 07/02/2015 94.6  80.0 - 100.0 fL Final  . MCH 07/02/2015 29.4  26.0 - 34.0 pg Final  . MCHC 07/02/2015 31.0* 32.0 - 36.0 g/dL Final  . RDW 07/02/2015 18.6* 11.5 - 14.5 % Final  . Platelets 07/02/2015 186  150 - 440 K/uL Final  . Neutrophils Relative % 07/02/2015 87   Final  . Neutro Abs 07/02/2015 6.2  1.4 - 6.5 K/uL Final  . Lymphocytes Relative 07/02/2015 4   Final  . Lymphs Abs 07/02/2015 0.3* 1.0 - 3.6 K/uL Final  . Monocytes Relative 07/02/2015 6   Final  . Monocytes Absolute 07/02/2015 0.4  0.2 - 0.9 K/uL Final  . Eosinophils Relative 07/02/2015 1   Final  . Eosinophils Absolute 07/02/2015 0.1  0 - 0.7 K/uL Final  . Basophils Relative 07/02/2015 2   Final  . Basophils Absolute 07/02/2015 0.1  0 - 0.1 K/uL Final  . Sodium 07/02/2015 143  135 - 145 mmol/L Final  . Potassium 07/02/2015 5.0  3.5 -  5.1 mmol/L Final  . Chloride  07/02/2015 104  101 - 111 mmol/L Final  . CO2 07/02/2015 23  22 - 32 mmol/L Final  . Glucose, Bld 07/02/2015 88  65 - 99 mg/dL Final  . BUN 07/02/2015 83* 6 - 20 mg/dL Final  . Creatinine, Ser 07/02/2015 10.53* 0.44 - 1.00 mg/dL Final  . Calcium 07/02/2015 9.7  8.9 - 10.3 mg/dL Final  . Total Protein 07/02/2015 7.0  6.5 - 8.1 g/dL Final  . Albumin 07/02/2015 3.5  3.5 - 5.0 g/dL Final  . AST 07/02/2015 18  15 - 41 U/L Final  . ALT 07/02/2015 15  14 - 54 U/L Final  . Alkaline Phosphatase 07/02/2015 245* 38 - 126 U/L Final  . Total Bilirubin 07/02/2015 0.8  0.3 - 1.2 mg/dL Final  . GFR calc non Af Amer 07/02/2015 3* >60 mL/min Final  . GFR calc Af Amer 07/02/2015 4* >60 mL/min Final   Comment: (NOTE) The eGFR has been calculated using the CKD EPI equation. This calculation has not been validated in all clinical situations. eGFR's persistently <60 mL/min signify possible Chronic Kidney Disease.   . Anion gap 07/02/2015 16* 5 - 15 Final  . Troponin I 07/02/2015 0.40* <0.031 ng/mL Final   Comment: READ BACK AND VERIFIED WITH OLIVIA BROOMER ON 07/02/15 AT 0856 BY QSD        PERSISTENTLY INCREASED TROPONIN VALUES IN THE RANGE OF 0.04-0.49 ng/mL CAN BE SEEN IN:       -UNSTABLE ANGINA       -CONGESTIVE HEART FAILURE       -MYOCARDITIS       -CHEST TRAUMA       -ARRYHTHMIAS       -LATE PRESENTING MYOCARDIAL INFARCTION       -COPD   CLINICAL FOLLOW-UP RECOMMENDED.   Marland Kitchen Prothrombin Time 07/02/2015 15.3* 11.4 - 15.0 seconds Final  . INR 07/02/2015 1.19   Final  . aPTT 07/02/2015 41* 24 - 36 seconds Final   Comment:        IF BASELINE aPTT IS ELEVATED, SUGGEST PATIENT RISK ASSESSMENT BE USED TO DETERMINE APPROPRIATE ANTICOAGULANT THERAPY.   . Heparin Unfractionated 07/02/2015 <0.10* 0.30 - 0.70 IU/mL Final   Comment:        IF HEPARIN RESULTS ARE BELOW EXPECTED VALUES, AND PATIENT DOSAGE HAS BEEN CONFIRMED, SUGGEST FOLLOW UP TESTING OF ANTITHROMBIN III LEVELS. RESULT REPEATED AND  VERIFIED   . Sodium 07/03/2015 140  135 - 145 mmol/L Final  . Potassium 07/03/2015 4.0  3.5 - 5.1 mmol/L Final  . Chloride 07/03/2015 102  101 - 111 mmol/L Final  . CO2 07/03/2015 29  22 - 32 mmol/L Final  . Glucose, Bld 07/03/2015 74  65 - 99 mg/dL Final  . BUN 07/03/2015 39* 6 - 20 mg/dL Final  . Creatinine, Ser 07/03/2015 5.77* 0.44 - 1.00 mg/dL Final  . Calcium 07/03/2015 9.1  8.9 - 10.3 mg/dL Final  . GFR calc non Af Amer 07/03/2015 7* >60 mL/min Final  . GFR calc Af Amer 07/03/2015 8* >60 mL/min Final   Comment: (NOTE) The eGFR has been calculated using the CKD EPI equation. This calculation has not been validated in all clinical situations. eGFR's persistently <60 mL/min signify possible Chronic Kidney Disease.   . Anion gap 07/03/2015 9  5 - 15 Final  . Troponin I 07/02/2015 0.30* <0.031 ng/mL Final   Comment: PREVIOUS RESULT CALLED AT 0856 07/02/15 QSD.Marland KitchenMLZ        PERSISTENTLY  INCREASED TROPONIN VALUES IN THE RANGE OF 0.04-0.49 ng/mL CAN BE SEEN IN:       -UNSTABLE ANGINA       -CONGESTIVE HEART FAILURE       -MYOCARDITIS       -CHEST TRAUMA       -ARRYHTHMIAS       -LATE PRESENTING MYOCARDIAL INFARCTION       -COPD   CLINICAL FOLLOW-UP RECOMMENDED.   Marland Kitchen Troponin I 07/02/2015 0.33* <0.031 ng/mL Final   Comment: PREVIOUS RESULT CALLED OLIVER BROOMER AT 6045 ON 07/02/15 BY QSD. VAB        PERSISTENTLY INCREASED TROPONIN VALUES IN THE RANGE OF 0.04-0.49 ng/mL CAN BE SEEN IN:       -UNSTABLE ANGINA       -CONGESTIVE HEART FAILURE       -MYOCARDITIS       -CHEST TRAUMA       -ARRYHTHMIAS       -LATE PRESENTING MYOCARDIAL INFARCTION       -COPD   CLINICAL FOLLOW-UP RECOMMENDED.   Marland Kitchen Troponin I 07/03/2015 0.32* <0.031 ng/mL Final   Comment: READ BACK AND VERIFIED WITH BRANDY MANSFIELD @ 0840 07/03/15 BY [facility]        PERSISTENTLY INCREASED TROPONIN VALUES IN THE RANGE OF 0.04-0.49 ng/mL CAN BE SEEN IN:       -UNSTABLE ANGINA       -CONGESTIVE HEART FAILURE        -MYOCARDITIS       -CHEST TRAUMA       -ARRYHTHMIAS       -LATE PRESENTING MYOCARDIAL INFARCTION       -COPD   CLINICAL FOLLOW-UP RECOMMENDED.   Marland Kitchen MRSA by PCR 07/02/2015 NEGATIVE  NEGATIVE Final   Comment:        The GeneXpert MRSA Assay (FDA approved for NASAL specimens only), is one component of a comprehensive MRSA colonization surveillance program. It is not intended to diagnose MRSA infection nor to guide or monitor treatment for MRSA infections.   . Heparin Unfractionated 07/03/2015 <0.10* 0.30 - 0.70 IU/mL Final   Comment:        IF HEPARIN RESULTS ARE BELOW EXPECTED VALUES, AND PATIENT DOSAGE HAS BEEN CONFIRMED, SUGGEST FOLLOW UP TESTING OF ANTITHROMBIN III LEVELS.   . WBC 07/03/2015 5.5  3.6 - 11.0 K/uL Final  . RBC 07/03/2015 2.41* 3.80 - 5.20 MIL/uL Final  . Hemoglobin 07/03/2015 7.0* 12.0 - 16.0 g/dL Final  . HCT 07/03/2015 22.4* 35.0 - 47.0 % Final  . MCV 07/03/2015 93.0  80.0 - 100.0 fL Final  . MCH 07/03/2015 28.9  26.0 - 34.0 pg Final  . MCHC 07/03/2015 31.1* 32.0 - 36.0 g/dL Final  . RDW 07/03/2015 18.4* 11.5 - 14.5 % Final  . Platelets 07/03/2015 149* 150 - 440 K/uL Final  . ABO/RH(D) 07/03/2015 A POS   Final  . Antibody Screen 07/03/2015 NEG   Final  . Sample Expiration 07/03/2015 07/06/2015   Final  . Unit Number 07/03/2015 [id number]   Final  . Blood Component Type 07/03/2015 RED CELLS,LR   Final  . Unit division 07/03/2015 00   Final  . Status of Unit 07/03/2015 ISSUED,FINAL   Final  . Transfusion Status 07/03/2015 OK TO TRANSFUSE   Final  . Crossmatch Result 07/03/2015 Compatible   Final  . Unit Number 07/03/2015 [id number]   Final  . Blood Component Type 07/03/2015 RBC LR PHER1   Final  . Unit division 07/03/2015 00   Final  .  Status of Unit 07/03/2015 ISSUED,FINAL   Final  . Transfusion Status 07/03/2015 OK TO TRANSFUSE   Final  . Crossmatch Result 07/03/2015 Compatible   Final  . Order Confirmation 07/03/2015 ORDER PROCESSED BY BLOOD  BANK   Final  . Hemoglobin 07/03/2015 9.0* 12.0 - 16.0 g/dL Final  . HCT 07/03/2015 28.1* 35.0 - 47.0 % Final  . Sodium 07/04/2015 140  135 - 145 mmol/L Final  . Potassium 07/04/2015 4.9  3.5 - 5.1 mmol/L Final  . Chloride 07/04/2015 99* 101 - 111 mmol/L Final  . CO2 07/04/2015 31  22 - 32 mmol/L Final  . Glucose, Bld 07/04/2015 84  65 - 99 mg/dL Final  . BUN 07/04/2015 52* 6 - 20 mg/dL Final  . Creatinine, Ser 07/04/2015 7.31* 0.44 - 1.00 mg/dL Final  . Calcium 07/04/2015 8.9  8.9 - 10.3 mg/dL Final  . GFR calc non Af Amer 07/04/2015 5* >60 mL/min Final  . GFR calc Af Amer 07/04/2015 6* >60 mL/min Final   Comment: (NOTE) The eGFR has been calculated using the CKD EPI equation. This calculation has not been validated in all clinical situations. eGFR's persistently <60 mL/min signify possible Chronic Kidney Disease.   . Anion gap 07/04/2015 10  5 - 15 Final  . WBC 07/04/2015 5.6  3.6 - 11.0 K/uL Final  . RBC 07/04/2015 2.94* 3.80 - 5.20 MIL/uL Final  . Hemoglobin 07/04/2015 8.8* 12.0 - 16.0 g/dL Final  . HCT 07/04/2015 27.1* 35.0 - 47.0 % Final  . MCV 07/04/2015 92.2  80.0 - 100.0 fL Final  . MCH 07/04/2015 29.8  26.0 - 34.0 pg Final  . MCHC 07/04/2015 32.4  32.0 - 36.0 g/dL Final  . RDW 07/04/2015 18.4* 11.5 - 14.5 % Final  . Platelets 07/04/2015 135* 150 - 440 K/uL Final  . WBC 07/05/2015 3.5* 3.6 - 11.0 K/uL Final  . RBC 07/05/2015 2.76* 3.80 - 5.20 MIL/uL Final  . Hemoglobin 07/05/2015 8.3* 12.0 - 16.0 g/dL Final  . HCT 07/05/2015 25.8* 35.0 - 47.0 % Final  . MCV 07/05/2015 93.5  80.0 - 100.0 fL Final  . MCH 07/05/2015 30.2  26.0 - 34.0 pg Final  . MCHC 07/05/2015 32.3  32.0 - 36.0 g/dL Final  . RDW 07/05/2015 18.6* 11.5 - 14.5 % Final  . Platelets 07/05/2015 117* 150 - 440 K/uL Final  . WBC 07/06/2015 2.9* 3.6 - 11.0 K/uL Final  . RBC 07/06/2015 2.66* 3.80 - 5.20 MIL/uL Final  . Hemoglobin 07/06/2015 7.9* 12.0 - 16.0 g/dL Final  . HCT 07/06/2015 24.7* 35.0 - 47.0 % Final    . MCV 07/06/2015 92.9  80.0 - 100.0 fL Final  . MCH 07/06/2015 29.5  26.0 - 34.0 pg Final  . MCHC 07/06/2015 31.8* 32.0 - 36.0 g/dL Final  . RDW 07/06/2015 18.1* 11.5 - 14.5 % Final  . Platelets 07/06/2015 106* 150 - 440 K/uL Final  . Ferritin 07/07/2015 371* 11 - 307 ng/mL Final  . Iron 07/07/2015 26* 28 - 170 ug/dL Final  . TIBC 07/07/2015 209* 250 - 450 ug/dL Final  . Saturation Ratios 07/07/2015 12  10.4 - 31.8 % Final  . UIBC 07/07/2015 183   Final  . WBC 07/07/2015 4.4  3.6 - 11.0 K/uL Final  . RBC 07/07/2015 2.55* 3.80 - 5.20 MIL/uL Final  . Hemoglobin 07/07/2015 7.4* 12.0 - 16.0 g/dL Final  . HCT 07/07/2015 23.3* 35.0 - 47.0 % Final  . MCV 07/07/2015 91.1  80.0 - 100.0 fL Final  .  MCH 07/07/2015 28.9  26.0 - 34.0 pg Final  . MCHC 07/07/2015 31.7* 32.0 - 36.0 g/dL Final  . RDW 07/07/2015 18.0* 11.5 - 14.5 % Final  . Platelets 07/07/2015 105* 150 - 440 K/uL Final      STUDIES: Dg Chest 1 View  07/02/2015  CLINICAL DATA:  Weakness, diabetes mellitus, CHF, hypertension, coronary artery disease, former smoker, end-stage renal disease, atrial fibrillation, prior stroke, pulmonary hypertension EXAM: CHEST 1 VIEW COMPARISON:  Portable exam 0829 hours compared to 04/12/2015 FINDINGS: Enlargement of cardiac silhouette with pulmonary vascular congestion. Atherosclerotic calcification mild tortuosity of thoracic aorta. Chronic accentuation of interstitial markings likely reflects chronic failure. No acute superimposed pulmonary edema or segmental consolidation. No pleural effusion or pneumothorax. IMPRESSION: Enlargement of cardiac silhouette with pulmonary vascular congestion and minimal chronic failure. No new abnormalities. Electronically Signed   By: Lavonia Dana M.D.   On: 07/02/2015 08:51   Dg Foot 2 Views Left  07/02/2015  CLINICAL DATA:  Worsening LEFT foot pain and swelling over past week, no known injury, history end-stage renal disease on dialysis, CHF, former smoker, atrial  fibrillation EXAM: LEFT FOOT - 2 VIEW COMPARISON:  None FINDINGS: Diffuse osseous demineralization. Soft tissue swelling diffusely. Joint spaces preserved. No acute fracture, dislocation, or bone destruction. Scattered small vessel vascular calcifications. IMPRESSION: Osseous demineralization and diffuse soft tissue swelling. No acute abnormalities. Electronically Signed   By: Lavonia Dana M.D.   On: 07/02/2015 11:34    ASSESSMENT and MEDICAL DECISION MAKING:  Anemia-likely combination of renal failure and, possibly, bone marrow failure. No clear evidence of recent bleeding, no clear evidence of iron deficiency after red blood cell transfusions and continues IV supplementation. Patient has been receiving Procrit regularly during hemodialysis, however, hemoglobin continues to remain significantly low. There is a possibility that myelodysplastic syndrome is developing, however, bone marrow biopsy would need to be performed in order to clearly establish the diagnosis. In the meantime we will start by checking vitamin B12 levels, folate levels, copper levels, serum protein electrophoresis and immunofixation, serum free light chains, as well as LDH and reticulocyte count. Thrombocytopenia-unlikely to be heparin-induced thrombocytopenia, since platelet count has been dropping from day 1 of recent exposure to heparin, and in H IT platelets usually start declining after day 5 from original exposure (unclear though whether heparin and had been administered during hemodialysis prior to admission). We need to check coagulation profile for evidence of DIC. Also, platelet count appeared to have stabilized. Physical exam and clinical course do not suggest DVT, PE, microvascular thrombosis. We will continue to monitor platelet count and clinical status. Since Helicobacter pylori can cause thrombocytopenia, we will check H. pylori antigen in the stool. It is impossible to rule out medication effects on hematologic parameters,  since Coreg, and to a lesser extent Protonix and Lyrica, have been implicated in development of thrombocytopenia, while Sensipar, Voltaren and, to a less extent losartan having associated with anemias We'll continue to follow   No matching staging information was found for the patient.  Roxana Hires, MD   07/07/2015 12:25 PM

## 2015-07-07 NOTE — Progress Notes (Signed)
PT Cancellation Note  Patient Details Name: Toni Carlson MRN: [id number] DOB: August 04, 1945   Cancelled Treatment:    Reason Eval/Treat Not Completed: Other (comment). Pt out of room for dialysis at this time. Will re-attempt next available date.   Shalayne Leach 07/07/2015, 1:17 PM  Greggory Stallion, PT, DPT [phone]

## 2015-07-07 NOTE — Progress Notes (Signed)
Subjective:  Patient continues to report pain in in her feet bilaterally which feels like a burning pain and is worse at night She states that the cream, meds helps some No shortness of breath   Objective:  Vital signs in last 24 hours:  Temp:  [98 F (36.7 C)-98.4 F (36.9 C)] 98.4 F (36.9 C) (02/21 0544) Pulse Rate:  [44-83] 52 (02/21 0745) Resp:  [16-18] 18 (02/21 0745) BP: (118-139)/(36-86) 126/48 mmHg (02/21 0745) SpO2:  [91 %-100 %] 100 % (02/21 0745)  Weight change:  Filed Weights   07/03/15 0514 07/04/15 1020 07/05/15 0544  Weight: 65 kg (143 lb 4.8 oz) 67.9 kg (149 lb 11.1 oz) 66.361 kg (146 lb 4.8 oz)    Intake/Output:    Intake/Output Summary (Last 24 hours) at 07/07/15 [id number] Last data filed at 07/07/15 0556  Gross per 24 hour  Intake      0 ml  Output      0 ml  Net      0 ml     Physical Exam: General: No distress, laying in the bed  HEENT anicteric  Neck supple  Pulm/lungs Normal effort, clear  CVS/Heart No rub or gallop  Abdomen:  Soft, nontender, nondistended  Extremities: discoloration over lower legs, trace edema  Neurologic: Alert, oriented, bilateral lower legs tender to touch  Skin: Warm, dry  Access: Left arm AVF       Basic Metabolic Panel:   Recent Labs Lab 07/02/15 0822 07/03/15 0426 07/04/15 0645  NA 143 140 140  K 5.0 4.0 4.9  CL 104 102 99*  CO2 23 29 31   GLUCOSE 88 74 84  BUN 83* 39* 52*  CREATININE 10.53* 5.77* 7.31*  CALCIUM 9.7 9.1 8.9     CBC:  Recent Labs Lab 07/02/15 0822 07/03/15 0426 07/03/15 1624 07/04/15 0645 07/05/15 0530 07/06/15 0455 07/07/15 0405  WBC 7.1 5.5  --  5.6 3.5* 2.9* 4.4  NEUTROABS 6.2  --   --   --   --   --   --   HGB 7.9* 7.0* 9.0* 8.8* 8.3* 7.9* 7.4*  HCT 25.6* 22.4* 28.1* 27.1* 25.8* 24.7* 23.3*  MCV 94.6 93.0  --  92.2 93.5 92.9 91.1  PLT 186 149*  --  135* 117* 106* 105*      Microbiology:  Recent Results (from the past 720 hour(s))  MRSA PCR Screening     Status:  None   Collection Time: 07/02/15  5:50 PM  Result Value Ref Range Status   MRSA by PCR NEGATIVE NEGATIVE Final    Comment:        The GeneXpert MRSA Assay (FDA approved for NASAL specimens only), is one component of a comprehensive MRSA colonization surveillance program. It is not intended to diagnose MRSA infection nor to guide or monitor treatment for MRSA infections.     Coagulation Studies: No results for input(s): LABPROT, INR in the last 72 hours.  Urinalysis: No results for input(s): COLORURINE, LABSPEC, PHURINE, GLUCOSEU, HGBUR, BILIRUBINUR, KETONESUR, PROTEINUR, UROBILINOGEN, NITRITE, LEUKOCYTESUR in the last 72 hours.  Invalid input(s): APPERANCEUR    Imaging: No results found.   Medications:     . amLODipine  10 mg Oral Daily  . aspirin  81 mg Oral Daily  . atorvastatin  40 mg Oral q1800  . calcium acetate (Phos Binder)  1,334 mg Oral TID WC  . capsaicin   Topical BID  . cinacalcet  60 mg Oral Q supper  . clopidogrel  75 mg Oral Daily  . diclofenac sodium  2 g Topical QID  . epoetin (EPOGEN/PROCRIT) injection  10,000 Units Intravenous Q T,Th,Sa-HD  . feeding supplement (ENSURE ENLIVE)  237 mL Oral TID WC  . gabapentin  300 mg Oral TID  . hydrALAZINE  25 mg Oral TID  . iron sucrose  200 mg Intravenous Q T,Th,Sa-HD  . losartan  100 mg Oral Daily  . pantoprazole  40 mg Oral Daily  . sodium chloride flush  3 mL Intravenous Q12H   acetaminophen, morphine injection, ondansetron (ZOFRAN) IV, oxyCODONE-acetaminophen, polyethylene glycol  Assessment/ Plan:  70 y.o. female with end stage renal disease on hemodialysis, GERD, anemia, atrial fibrillation, CVA, coronary artery disease, pulmonary hypertension, carotid artery stenosis    TTS [facility] [address]   1. End Stage Renal Disease:   - Continue TTS schedule.   2. Peripheral vascular disease: concern for ischemic leg.  - angiogram planned - Neurology consult to evaluate for peripheral  neuropathy  3. Anemia of chronic kidney disease and iron deficiency:   - IV iron given as outpatient. Will plan for iv iron with HD starting today  - venofer 200 mg iv x 5 doses  4. Secondary Hyperparathyroidism:  - phoslyra and sensipar.  - ddx includes calciphylaxis      LOS: 5 Palmira Stickle 2/21/20179:27 AM

## 2015-07-07 NOTE — Progress Notes (Signed)
Toni Carlson at [facility] [place] NAME: Toni Carlson    MR#:  [id number]  DATE OF BIRTH:  [birth year]  SUBJECTIVE:  CHIEF COMPLAINT:   Chief Complaint  Patient presents with  . Weakness  . Leg Swelling   Still pain in bilateral legs and feet , worse at night.  Patient feels good today overall, pain is insignificant. Patient was seen by nephrologist, who recommended to advance gabapentin due to concerns of neuropathy, neurology consultation is obtained to evaluate for peripheral neuropathy.  Patient was seen by neurologist today, gabapentin was discontinued. 10. Lyrica was initiated, multiple labs were taken to evaluate for peripheral neuropathy, MRI of lumbar spine was recommended, ordered Patient feels better today with advancement of gabapentin. Patient was seen during hemodialysis REVIEW OF SYSTEMS:  CONSTITUTIONAL: No fever, has weakness.  EYES: No blurred or double vision.  EARS, NOSE, AND THROAT: No tinnitus or ear pain.  RESPIRATORY: No cough, shortness of breath, wheezing or hemoptysis.  CARDIOVASCULAR: No chest pain, orthopnea, edema.  GASTROINTESTINAL: No nausea, vomiting, diarrhea or abdominal pain. No melena or bloody stool. GENITOURINARY: No dysuria, hematuria.  ENDOCRINE: No polyuria, nocturia,  HEMATOLOGY: No anemia, easy bruising or bleeding SKIN: No rash or lesion. MUSCULOSKELETAL: both feet pain. NEUROLOGIC: No tingling, numbness, weakness.  PSYCHIATRY: No anxiety or depression.   DRUG ALLERGIES:  No Known Allergies  VITALS:  Blood pressure 156/53, pulse 52, temperature 98.1 F (36.7 C), temperature source Oral, resp. rate 18, height 5\' 6"  (1.676 m), weight 68.176 kg (150 lb 4.8 oz), SpO2 99 %.  PHYSICAL EXAMINATION:  GENERAL:  70 y.o.-year-old patient lying in the bed with no acute distress, alert and comfortable  EYES: Pupils equal, round, reactive to light and accommodation. No scleral icterus. Extraocular muscles intact.   HEENT: Head atraumatic, normocephalic. Oropharynx and nasopharynx clear.  NECK:  Supple, no jugular venous distention. No thyroid enlargement, no tenderness.  LUNGS: Normal breath sounds bilaterally, no wheezing, rales,rhonchi or crepitation. No use of accessory muscles of respiration.  CARDIOVASCULAR: S1, S2 normal. No murmurs, rubs, or gallops.  ABDOMEN: Soft, nontender, mild distention. Bowel sounds present. No organomegaly or mass.  EXTREMITIES: trace pedal edema, no pedal pulses, no cyanosis, or clubbing.  NEUROLOGIC: Cranial nerves II through XII are intact. Muscle strength 4/5 in all extremities. Sensation intact. Gait not checked.  PSYCHIATRIC: The patient is alert and oriented x 3.  SKIN: No obvious rash, lesion, or ulcer.    LABORATORY PANEL:   CBC  Recent Labs Lab 07/07/15 0405  WBC 4.4  HGB 7.4*  HCT 23.3*  PLT 105*   ------------------------------------------------------------------------------------------------------------------  Chemistries   Recent Labs Lab 07/02/15 0822  07/04/15 0645  NA 143  < > 140  K 5.0  < > 4.9  CL 104  < > 99*  CO2 23  < > 31  GLUCOSE 88  < > 84  BUN 83*  < > 52*  CREATININE 10.53*  < > 7.31*  CALCIUM 9.7  < > 8.9  AST 18  --   --   ALT 15  --   --   ALKPHOS 245*  --   --   BILITOT 0.8  --   --   < > = values in this interval not displayed. ------------------------------------------------------------------------------------------------------------------  Cardiac Enzymes  Recent Labs Lab 07/03/15 0426  TROPONINI 0.32*   ------------------------------------------------------------------------------------------------------------------  RADIOLOGY:  No results found.  EKG:   Orders placed or performed during the hospital encounter of  07/02/15  . ED EKG  . ED EKG    ASSESSMENT AND PLAN:   Toni Carlson is a 69 y.o. female with a known history of end-stage renal disease on Tuesday Thursday Saturday dialysis, chronic  diastolic CHF, chronic anemia with recent admission in November 2016 for GI bleed requiring 3 units of packed RBC transfusion, history of ischemic right MCA stroke, atrial fibrillation not on anticoagulation due to GI bleed presents to the hospital from home secondary to worsening left foot pain and swelling and also missed hemodialysis for the last 2 sessions.  #1 . Bilateral Lower extremity,  feet pain, initially felt to be due to peripheral arterial disease, but most likely peripheral neuropathy, continue Lyrica advance as needed, getting MRI of lumbar spine, per neurology recommendations, continue evaluation by checking vitamin D levels B1, RPR, SPEP and a TSH. Had a metals and is associated dementia as her rate as well as CK levels.  Some swelling noted as well. Known history of arthritis. Foot x-ray: No acute abnormalities.  Poor dorsalis pedis pulses. -Also history of A. fib. Started on IV heparin drip, now discontinued. -Pain medications as needed, improved with gabapentin, now on Lyrica, neurology consultation is appreciated. Vascular surgery does not recommend angiogram at this time, follow-up as outpatient.  Continue antiplatelet therapy with Plavix and continue statin use.  . #2 end-stage renal disease on hemodialysis-on Tuesday Thursday Saturday schedule. Missed her 2 dialysis sessions. Continue HD, last session 21st of February, next is planned in 2 days  #3 elevated troponin-likely demand ischemia and also from her renal failure due to poor renal clearance. -Last echocardiogram in November 2016 showing LVH, normal ejection fraction and diastolic dysfunction. Patient has no chest pains  #4 atrial fibrillation-off of warfarin due to GI bleed. Rate is low, off Coreg, following closely, TSH was within normal limits  #5 anemia of chronic disease due to end-stage renal disease. Baseline hemoglobin is around 8. Admission at the end of November requiring transfusion after GI bleed. Warfarin  discontinued then. Hemoglobin is 7.0 on admission, patient received 1 unit PRBC transfusion, Hb has improved to 8.3 yesterday, 7.4 today. No bleeding was noted, follow hemoglobin level periodically, Epogen per nephrologist.  #6 recent CVA in November 2016-known history of A. fib and had stroke. However Coumadin has been discontinued for the last couple of months due to acute GI bleed requiring transfusion recently. Discontinued, aspirin, continue Plavix due to peripheral vascular disease, Continue statin  #7 hypertension-, patient is off Coreg due to bradycardia, continue losartan,  Norvasc , adding hydralazine  #8 DVT prophylaxis-holding heparin due to thrombocytopenia  #9. Generalized weakness, patient was seen by physical therapist,  SNF placement was recommended.  #10. Pancytopenia, etiology is unclear, improved. White blood cell count today, hemoglobin remains relatively stable, platelet count is also stable but low, holding heparin, oncologist recommendations are pending. Pending level B12, ferritin, iron, TIBC levels revealed anemia of chronic disease   All the records are reviewed and case discussed with Care Management/Social Workerr. Management plans discussed with the patient, family and they are in agreement.  CODE STATUS full code.  TOTAL TIME TAKING CARE OF THIS PATIENT: 35 minutes.     Theodoro Grist M.D on 07/07/2015 at 5:23 PM  Between 7am to 6pm - Pager - [phone]  After 6pm go to [email or web address] - password [email or web address] [facility]  Office  [phone]  CC: Primary care physician; Murlean Iba, MD

## 2015-07-07 NOTE — Consult Note (Signed)
Reason for Consult:BLE pain Referring Physician: Ether Griffins  CC: BLE pain  HPI: Toni Carlson is an 70 y.o. female with a history of ESRD on dialysis.  Reports that about 3 months ago she began to notice that her toes were numb.  About 2 weeks later the feet began to hurt.  This pain has ascended up her legs over the past 2-3 months and has now gotten to her knees.  She feels weak in her legs as well.  She is not able to walk due to the pain and weakness.  She does not report bowel incontinence.  She has been tried on Neurontin on her pain without much benefit.    Past Medical History  Diagnosis Date  . Hypertension   . ESRD (end stage renal disease) ([place])     On Tue-Thur-Sat dialysis  . Chronic diastolic CHF (congestive heart failure) ([place])     a. echo 03/2015: EF 60-65%, normal wall motion, diastolic parameters were c/w restrictive physiology, indicative of decreased LV diastolic compliance and/or increased LA pressure, mild AS, mild MR, left atrium was severely dilated, RA was severely dilated, PASP was severely increased at 90 mm Hg  . Anemia   . Hyperparathyroidism, secondary renal ([place])   . Coronary artery disease, non-occlusive     a. cardiac cath 2013  . H/O ischemic right MCA stroke     a. 03/2015; b. residual left-sided facial droop and slurred speech  . A-fib ([facility])     a. on warfarin  . Pulmonary HTN ([place])     as above  . Symptomatic bradycardia     a. history of symptomatic bradycardia; b. felt to be 2/2 metabolic abnormalities & required temp wire but no PPM, resolved with HD  . Carotid artery stenosis     a. ultrasound 03/2015: RICA 93-23% stenosis, LICA less than 55% stenosis    Past Surgical History  Procedure Laterality Date  . Peripheral vascular catheterization N/A 02/23/2015    Procedure: A/V Shuntogram/Fistulagram;  Surgeon: Algernon Huxley, MD;  Location: [facility];  Service: Cardiovascular;  Laterality: N/A;  . Peripheral vascular catheterization N/A  02/23/2015    Procedure: A/V Shunt Intervention;  Surgeon: Algernon Huxley, MD;  Location: [facility];  Service: Cardiovascular;  Laterality: N/A;  . Esophagogastroduodenoscopy (egd) with propofol N/A 04/14/2015    Procedure: ESOPHAGOGASTRODUODENOSCOPY (EGD) WITH PROPOFOL;  Surgeon: Lucilla Lame, MD;  Location: [facility];  Service: Endoscopy;  Laterality: N/A;    Family History  Problem Relation Age of Onset  . CAD Father   . Dementia Mother     Social History:  reports that she has quit smoking. Her smoking use included Cigarettes. She has a 7.5 pack-year smoking history. She has never used smokeless tobacco. She reports that she does not drink alcohol or use illicit drugs.  No Known Allergies  Medications:  I have reviewed the patient's current medications. Prior to Admission:  Prescriptions prior to admission  Medication Sig Dispense Refill Last Dose  . amLODipine (NORVASC) 10 MG tablet Take 10 mg by mouth daily.   unsure  . atorvastatin (LIPITOR) 40 MG tablet Take 1 tablet (40 mg total) by mouth daily at 6 PM. 30 tablet 6 unsure  . carvedilol (COREG) 6.25 MG tablet Take 1 tablet (6.25 mg total) by mouth 2 (two) times daily with a meal. 60 tablet 6 unsure  . feeding supplement, ENSURE COMPLETE, (ENSURE COMPLETE) LIQD Take 237 mLs by mouth 3 (three) times daily  with meals. 90 Bottle 6 unsure  . gabapentin (NEURONTIN) 300 MG capsule Take 1 capsule by mouth at bedtime.  6 unsure  . hydrALAZINE (APRESOLINE) 25 MG tablet Take 25 mg by mouth 3 (three) times daily.    unsure  . losartan (COZAAR) 100 MG tablet Take 100 mg by mouth daily.   unsure  . omeprazole (PRILOSEC) 20 MG capsule Take 1 capsule by mouth daily.  0 unsure  . PHOSLYRA 667 MG/5ML SOLN Take 1,334 mg by mouth 3 (three) times daily with meals.    unsure  . polyethylene glycol (MIRALAX / GLYCOLAX) packet Take 17 g by mouth daily as needed for mild constipation. 14 each 0 prn at prn  . VOLTAREN 1 % GEL Apply 2 g  topically 4 (four) times daily.    unsure  . trolamine salicylate (ASPERCREME) 10 % cream Apply topically 2 (two) times daily as needed for muscle pain. (Patient not taking: Reported on 07/02/2015) 85 g 0 Completed Course at Unknown time   Scheduled: . amLODipine  10 mg Oral Daily  . aspirin  81 mg Oral Daily  . atorvastatin  40 mg Oral q1800  . calcium acetate (Phos Binder)  1,334 mg Oral TID WC  . capsaicin   Topical BID  . cinacalcet  60 mg Oral Q supper  . clopidogrel  75 mg Oral Daily  . diclofenac sodium  2 g Topical QID  . epoetin (EPOGEN/PROCRIT) injection  10,000 Units Intravenous Q T,Th,Sa-HD  . feeding supplement (ENSURE ENLIVE)  237 mL Oral TID WC  . gabapentin  300 mg Oral TID  . hydrALAZINE  25 mg Oral TID  . iron sucrose  200 mg Intravenous Q T,Th,Sa-HD  . losartan  100 mg Oral Daily  . pantoprazole  40 mg Oral Daily  . sodium chloride flush  3 mL Intravenous Q12H    ROS: History obtained from the patient  General ROS: negative for - chills, fatigue, fever, night sweats, weight gain or weight loss Psychological ROS: negative for - behavioral disorder, hallucinations, memory difficulties, mood swings or suicidal ideation Ophthalmic ROS: negative for - blurry vision, double vision, eye pain or loss of vision ENT ROS: negative for - epistaxis, nasal discharge, oral lesions, sore throat, tinnitus or vertigo Allergy and Immunology ROS: negative for - hives or itchy/watery eyes Hematological and Lymphatic ROS: negative for - bleeding problems, bruising or swollen lymph nodes Endocrine ROS: negative for - galactorrhea, hair pattern changes, polydipsia/polyuria or temperature intolerance Respiratory ROS: negative for - cough, hemoptysis, shortness of breath or wheezing Cardiovascular ROS: lower extremity edema Gastrointestinal ROS: negative for - abdominal pain, diarrhea, hematemesis, nausea/vomiting or stool incontinence Genito-Urinary ROS: negative for - dysuria, hematuria,  incontinence or urinary frequency/urgency Musculoskeletal ROS: negative for - joint swelling or muscular weakness Neurological ROS: as noted in HPI Dermatological ROS: negative for rash and skin lesion changes  Physical Examination: Blood pressure 126/48, pulse 61, temperature 98.4 F (36.9 C), temperature source Oral, resp. rate 18, height '5\' 6"'  (1.676 m), weight 66.361 kg (146 lb 4.8 oz), SpO2 100 %.  HEENT-  Normocephalic, no lesions, without obvious abnormality.  Normal external eye and conjunctiva.  Normal TM's bilaterally.  Normal auditory canals and external ears. Normal external nose, mucus membranes and septum.  Normal pharynx. Cardiovascular- S1, S2 normal, pulses palpable throughout   Lungs- chest clear, no wheezing, rales, normal symmetric air entry Abdomen- soft, non-tender; bowel sounds normal; no masses,  no organomegaly Extremities- lower extremity edema Lymph-no adenopathy  palpable Musculoskeletal-legs painful to palpation Skin-skin darkened below the knee bilaterally  Neurological Examination Mental Status: Alert, oriented, thought content appropriate.  Speech fluent without evidence of aphasia.  Able to follow 3 step commands without difficulty. Motor: 5/5 in the BUE's.  3-4/5 in the LLE and 4/5 in the RLE.   Sensory: Pinprick and light touch intact throughout, bilaterally.  Decreased vibratory sensation in the RLE to the knee and to the ankle in the LLE. Deep Tendon Reflexes: 1+ in the upper extremities and absent in the lower extremities. Plantars: Right: mute   Left: mute Cerebellar: Normal finger-to-nose testing.  Unable to perform heel-to-shin testing Gait: not tested due to safety concerns   Laboratory Studies:   Basic Metabolic Panel:  Recent Labs Lab 07/02/15 0822 07/03/15 0426 07/04/15 0645  NA 143 140 140  K 5.0 4.0 4.9  CL 104 102 99*  CO2 '23 29 31  ' GLUCOSE 88 74 84  BUN 83* 39* 52*  CREATININE 10.53* 5.77* 7.31*  CALCIUM 9.7 9.1 8.9     Liver Function Tests:  Recent Labs Lab 07/02/15 0822  AST 18  ALT 15  ALKPHOS 245*  BILITOT 0.8  PROT 7.0  ALBUMIN 3.5   No results for input(s): LIPASE, AMYLASE in the last 168 hours. No results for input(s): AMMONIA in the last 168 hours.  CBC:  Recent Labs Lab 07/02/15 0822 07/03/15 0426 07/03/15 1624 07/04/15 0645 07/05/15 0530 07/06/15 0455 07/07/15 0405  WBC 7.1 5.5  --  5.6 3.5* 2.9* 4.4  NEUTROABS 6.2  --   --   --   --   --   --   HGB 7.9* 7.0* 9.0* 8.8* 8.3* 7.9* 7.4*  HCT 25.6* 22.4* 28.1* 27.1* 25.8* 24.7* 23.3*  MCV 94.6 93.0  --  92.2 93.5 92.9 91.1  PLT 186 149*  --  135* 117* 106* 105*    Cardiac Enzymes:  Recent Labs Lab 07/02/15 0822 07/02/15 1805 07/02/15 2309 07/03/15 0426  TROPONINI 0.40* 0.30* 0.33* 0.32*    BNP: Invalid input(s): POCBNP  CBG: No results for input(s): GLUCAP in the last 168 hours.  Microbiology: Results for orders placed or performed during the hospital encounter of 07/02/15  MRSA PCR Screening     Status: None   Collection Time: 07/02/15  5:50 PM  Result Value Ref Range Status   MRSA by PCR NEGATIVE NEGATIVE Final    Comment:        The GeneXpert MRSA Assay (FDA approved for NASAL specimens only), is one component of a comprehensive MRSA colonization surveillance program. It is not intended to diagnose MRSA infection nor to guide or monitor treatment for MRSA infections.     Coagulation Studies: No results for input(s): LABPROT, INR in the last 72 hours.  Urinalysis: No results for input(s): COLORURINE, LABSPEC, PHURINE, GLUCOSEU, HGBUR, BILIRUBINUR, KETONESUR, PROTEINUR, UROBILINOGEN, NITRITE, LEUKOCYTESUR in the last 168 hours.  Invalid input(s): APPERANCEUR  Lipid Panel:     Component Value Date/Time   CHOL 171 03/26/2015 1746   CHOL 176 08/23/2011 0641   TRIG 79 03/26/2015 1746   TRIG 53 08/23/2011 0641   HDL 48 03/26/2015 1746   HDL 65* 08/23/2011 0641   CHOLHDL 3.6 03/26/2015 1746    VLDL 16 03/26/2015 1746   VLDL 11 08/23/2011 0641   LDLCALC 107* 03/26/2015 1746   LDLCALC 100 08/23/2011 0641    HgbA1C:  Lab Results  Component Value Date   HGBA1C 5.6 03/27/2015    Urine Drug Screen:  No results found for: LABOPIA, COCAINSCRNUR, LABBENZ, AMPHETMU, THCU, LABBARB  Alcohol Level: No results for input(s): ETH in the last 168 hours.   Imaging: No results found.   Assessment/Plan: 70 year old female with lower extremity pain related to what appears to be a polyneuropathy.  Would doubt related to a uremia since patient has been compliant with dialysis.  No evidence of diabetes.  Will look into other possible etiologies.  Pain is  The biggest issue at this time.  Has had some response to Neurontin.  May benefit better from Lyrica.    Recommendations: 1. D/C Neurontin 2.  Lyrica 3m BID 3.  MRI of the lumbar spine without contrast.  If unremarkable patient will likely benefit from a NCV/EMG.  This will need to be performed as an outpatient.  Biopsy can be considered at that time as well.   4.  Vitamin D, B1, RPR, SPEP, ANA, TSH, heavy metal, ESR, CK 5.  Orthostatic BP with heart rate  LAlexis Goodell MD Neurology [phone]/21/2017, 11:07 AM

## 2015-07-08 ENCOUNTER — Inpatient Hospital Stay: Payer: Medicare Other

## 2015-07-08 DIAGNOSIS — I70209 Unspecified atherosclerosis of native arteries of extremities, unspecified extremity: Secondary | ICD-10-CM

## 2015-07-08 DIAGNOSIS — R531 Weakness: Secondary | ICD-10-CM

## 2015-07-08 DIAGNOSIS — M5136 Other intervertebral disc degeneration, lumbar region: Secondary | ICD-10-CM

## 2015-07-08 DIAGNOSIS — D61818 Other pancytopenia: Secondary | ICD-10-CM

## 2015-07-08 DIAGNOSIS — Z992 Dependence on renal dialysis: Secondary | ICD-10-CM

## 2015-07-08 DIAGNOSIS — G629 Polyneuropathy, unspecified: Secondary | ICD-10-CM

## 2015-07-08 DIAGNOSIS — N186 End stage renal disease: Secondary | ICD-10-CM

## 2015-07-08 LAB — CBC WITH DIFFERENTIAL/PLATELET
BASOS ABS: 0 10*3/uL (ref 0–0.1)
Basophils Relative: 1 %
EOS ABS: 0.1 10*3/uL (ref 0–0.7)
EOS PCT: 2 %
HEMATOCRIT: 22.2 % — AB (ref 35.0–47.0)
Hemoglobin: 7.2 g/dL — ABNORMAL LOW (ref 12.0–16.0)
Lymphocytes Relative: 10 %
Lymphs Abs: 0.4 10*3/uL — ABNORMAL LOW (ref 1.0–3.6)
MCH: 29.3 pg (ref 26.0–34.0)
MCHC: 32.3 g/dL (ref 32.0–36.0)
MCV: 90.7 fL (ref 80.0–100.0)
MONO ABS: 0.4 10*3/uL (ref 0.2–0.9)
Monocytes Relative: 9 %
Neutro Abs: 3.3 10*3/uL (ref 1.4–6.5)
Neutrophils Relative %: 78 %
Platelets: 121 10*3/uL — ABNORMAL LOW (ref 150–440)
RBC: 2.44 MIL/uL — AB (ref 3.80–5.20)
RDW: 17.7 % — AB (ref 11.5–14.5)
WBC: 4.1 10*3/uL (ref 3.6–11.0)

## 2015-07-08 LAB — PROTIME-INR
INR: 1.1
PROTHROMBIN TIME: 14.4 s (ref 11.4–15.0)

## 2015-07-08 LAB — FIBRIN DERIVATIVES D-DIMER (ARMC ONLY): FIBRIN DERIVATIVES D-DIMER (ARMC): 1588 — AB (ref 0–499)

## 2015-07-08 LAB — APTT: APTT: 42 s — AB (ref 24–36)

## 2015-07-08 LAB — RETICULOCYTES
RBC.: 2.44 MIL/uL — AB (ref 3.80–5.20)
RETIC COUNT ABSOLUTE: 17.1 10*3/uL — AB (ref 19.0–183.0)
RETIC CT PCT: 0.7 % (ref 0.4–3.1)

## 2015-07-08 LAB — FIBRINOGEN: Fibrinogen: 426 mg/dL (ref 210–470)

## 2015-07-08 LAB — LACTATE DEHYDROGENASE: LDH: 163 U/L (ref 98–192)

## 2015-07-08 MED ORDER — CLOPIDOGREL BISULFATE 75 MG PO TABS: 75.0000 mg | ORAL_TABLET | Freq: Every day | ORAL | Status: DC

## 2015-07-08 MED ORDER — OXYCODONE-ACETAMINOPHEN 5-325 MG PO TABS: 1.0000 | ORAL_TABLET | ORAL | Status: DC | PRN

## 2015-07-08 MED ORDER — CINACALCET HCL 30 MG PO TABS: 60.0000 mg | ORAL_TABLET | Freq: Every day | ORAL | Status: DC

## 2015-07-08 MED ORDER — PREGABALIN 25 MG PO CAPS: 25.0000 mg | ORAL_CAPSULE | Freq: Two times a day (BID) | ORAL | Status: DC

## 2015-07-08 NOTE — Discharge Summary (Signed)
[facility] NAME: Toni Carlson    MR#:  [id number]  DATE OF BIRTH:  05-Jan-1946  DATE OF ADMISSION:  07/02/2015 ADMITTING PHYSICIAN: Gladstone Lighter, MD  DATE OF DISCHARGE: No discharge date for patient encounter.  PRIMARY CARE PHYSICIAN: Murlean Iba, MD     ADMISSION DIAGNOSIS:  End stage renal disease ([place]) [N18.6] Peripheral vascular disease ([place]) [I73.9] Weakness [R53.1] Pain [R52] Elevated troponin I level [R79.89]  DISCHARGE DIAGNOSIS:  Principal Problem:   Pain in both feet Active Problems:   Polyneuropathy (HCC)   Degenerative disc disease, lumbar   Pancytopenia (HCC)   Generalized weakness   Atherosclerotic peripheral vascular disease (HCC)   End stage renal disease ([place])   SECONDARY DIAGNOSIS:   Past Medical History  Diagnosis Date  . Hypertension   . ESRD (end stage renal disease) ([place])     On Tue-Thur-Sat dialysis  . Chronic diastolic CHF (congestive heart failure) ([place])     a. echo 03/2015: EF 60-65%, normal wall motion, diastolic parameters were c/w restrictive physiology, indicative of decreased LV diastolic compliance and/or increased LA pressure, mild AS, mild MR, left atrium was severely dilated, RA was severely dilated, PASP was severely increased at 90 mm Hg  . Anemia   . Hyperparathyroidism, secondary renal ([place])   . Coronary artery disease, non-occlusive     a. cardiac cath 2013  . H/O ischemic right MCA stroke     a. 03/2015; b. residual left-sided facial droop and slurred speech  . A-fib (HCC)     a. on warfarin  . Pulmonary HTN ([place])     as above  . Symptomatic bradycardia     a. history of symptomatic bradycardia; b. felt to be 2/2 metabolic abnormalities & required temp wire but no PPM, resolved with HD  . Carotid artery stenosis     a. ultrasound 03/2015: RICA 89-38% stenosis, LICA less than 10% stenosis    .pro HOSPITAL COURSE:   The patient is 70 year old  African-American female with past medical history significant for history of end-stage renal disease resented to the hospital with lower extremity numbness, pain, weakness. Apparently patient has been having those symptoms for the past 3 months. Initially, but those were numb then feeds started hurting and it ascended up to her legs, patient has difficulty walking due to pain and weakness. She did not report any bowel incontinence. On arrival to emergency room, she was noted that her pulses were not palpable, no necrosis or gangrene was noted. Patient was admitted to the hospital for further evaluation and treatment. Vascular surgery consultation was obtained. Dr. Dante Gang saw patient in consultation and recommended angiogram and considered oral antiplatelet therapy as well as continue statin use. Patient was given aspirin as well as Plavix. However, due to thrombocytopenia, these medications were felt not to be safe. Consultation with neurologist was obtained who felt that patient had polyneuropathy, recommended to initiate Lyrica, get MRI of lumbar spine without contrast. Get outpatient nerve conduction studies/EMG, check vitamin B,  D levels, RPR, SPEP and a TSH, heavy metals, ESR, CK levels. Total CK level was 60, which was within normal limits as well as LDH level, vitamin B12 level was 509, sedimentation rate was elevated at 56, TSH was normal at 2.479, HIV testing was nonreactive, and monitor of lumbar spine without contrast revealed degenerative disc disease, thecal sac narrowing in L3-4 and L4-5 levels. She was seen by physical therapist in skilled nursing facility rehabilitation placement was  recommended, where patient will be discharged today Discussion by problem #1 . Bilateral Lower extremity, feet pain, initially felt to be due to peripheral arterial disease, but most likely polyneuropathy, continue Lyrica at current dose, MRI of lumbar spine revealed degenerative disc disease, thecal sac narrowing in  L3-4, L4-5 levels, appreciate neurology recommendations, Vascular surgery recommends follow-up as outpatient and possibly angiogram at a later time.  Continue Plavix and continue statin use, following platelet count closely, as well as hemoglobin level.  . #2 end-stage renal disease on hemodialysis-on Tuesday Thursday Saturday schedule. Last hemodialysis session was 21st of February, next one per schedule  #3 elevated troponin-likely demand ischemia and also from her renal failure due to poor renal clearance. -Last echocardiogram in November 2016 showing LVH, normal ejection fraction and diastolic dysfunction. Patient had no chest pains  #4 atrial fibrillation-off of warfarin due to GI bleed. Rate is low, Coreg was stopped, following heart rate closely, TSH was within normal limits  #5 anemia of chronic disease due to end-stage renal disease. Baseline hemoglobin is around 8. Admission at the end of November requiring transfusion after GI bleed. Warfarin discontinued then. Hemoglobin is 7.0 on admission, patient received 1 unit PRBC transfusion, Hb has improved to 8.3 , now it is declining again, 7.2 today. No bleeding was noted, follow hemoglobin level periodically, Epogen per nephrologist.  #6 recent CVA in November 2016-known history of A. fib and had stroke. However Coumadin has been discontinued for the last couple of months due to acute GI bleed requiring transfusion recently. Discontinued aspirin, continue Plavix due to peripheral vascular disease, Continue statin  #7 hypertension-, patient is off Coreg due to bradycardia, continue losartan, Norvasc , low-dose of hydralazine, blood pressure is still poorly controlled   with systolic blood pressure ranging between 150s to 160s, advance medications as needed  #8 DVT prophylaxis-holding heparin due to thrombocytopenia  #9. Generalized weakness, patient was seen by physical therapist, SNF placement was recommended.  #10. Pancytopenia,  etiology is unclear, as was seen by oncologist, who was concerned about possible bone marrow failure, myelodysplastic syndrome, he recommended to check Q19 level, folic acid level, per level, serum protein electrophoresis, free light chains, LDH and reticulocyte count. In regards of thrombocytopenia. He did not feel that it was heparin-induced thrombocytopenia, he recommended to check H. pylori antigen in stool. Results of some studies are back, patient would benefit from a low with Dr. Rudean Hitt.   DISCHARGE CONDITIONS:   Stable  CONSULTS OBTAINED:  Treatment Team:  Lavonia Dana, MD Catarina Hartshorn, MD Roxana Hires, MD  DRUG ALLERGIES:  No Known Allergies  DISCHARGE MEDICATIONS:   Current Discharge Medication List    START taking these medications   Details  cinacalcet (SENSIPAR) 30 MG tablet Take 2 tablets (60 mg total) by mouth daily with supper. Qty: 60 tablet, Refills: 6    oxyCODONE-acetaminophen (PERCOCET/ROXICET) 5-325 MG tablet Take 1 tablet by mouth every 4 (four) hours as needed for moderate pain. Qty: 30 tablet, Refills: 0    pregabalin (LYRICA) 25 MG capsule Take 1 capsule (25 mg total) by mouth 2 (two) times daily. Qty: 60 capsule, Refills: 5      CONTINUE these medications which have NOT CHANGED   Details  amLODipine (NORVASC) 10 MG tablet Take 10 mg by mouth daily.    atorvastatin (LIPITOR) 40 MG tablet Take 1 tablet (40 mg total) by mouth daily at 6 PM. Qty: 30 tablet, Refills: 6    carvedilol (COREG) 6.25 MG tablet Take 1  tablet (6.25 mg total) by mouth 2 (two) times daily with a meal. Qty: 60 tablet, Refills: 6    feeding supplement, ENSURE COMPLETE, (ENSURE COMPLETE) LIQD Take 237 mLs by mouth 3 (three) times daily with meals. Qty: 90 Bottle, Refills: 6    hydrALAZINE (APRESOLINE) 25 MG tablet Take 25 mg by mouth 3 (three) times daily.     losartan (COZAAR) 100 MG tablet Take 100 mg by mouth daily.    omeprazole (PRILOSEC) 20 MG capsule Take 1  capsule by mouth daily. Refills: 0    PHOSLYRA 667 MG/5ML SOLN Take 1,334 mg by mouth 3 (three) times daily with meals.     polyethylene glycol (MIRALAX / GLYCOLAX) packet Take 17 g by mouth daily as needed for mild constipation. Qty: 14 each, Refills: 0    VOLTAREN 1 % GEL Apply 2 g topically 4 (four) times daily.     trolamine salicylate (ASPERCREME) 10 % cream Apply topically 2 (two) times daily as needed for muscle pain. Qty: 85 g, Refills: 0      STOP taking these medications     gabapentin (NEURONTIN) 300 MG capsule          DISCHARGE INSTRUCTIONS:    Patient is to follow-up with primary care physician, vascular surgeon, pain management specialist, oncologist, nephrologist  If you experience worsening of your admission symptoms, develop shortness of breath, life threatening emergency, suicidal or homicidal thoughts you must seek medical attention immediately by calling 911 or calling your MD immediately  if symptoms less severe.  You Must read complete instructions/literature along with all the possible adverse reactions/side effects for all the Medicines you take and that have been prescribed to you. Take any new Medicines after you have completely understood and accept all the possible adverse reactions/side effects.   Please note  You were cared for by a hospitalist during your hospital stay. If you have any questions about your discharge medications or the care you received while you were in the hospital after you are discharged, you can call the unit and asked to speak with the hospitalist on call if the hospitalist that took care of you is not available. Once you are discharged, your primary care physician will handle any further medical issues. Please note that NO REFILLS for any discharge medications will be authorized once you are discharged, as it is imperative that you return to your primary care physician (or establish a relationship with a primary care physician if  you do not have one) for your aftercare needs so that they can reassess your need for medications and monitor your lab values.    Today   CHIEF COMPLAINT:   Chief Complaint  Patient presents with  . Weakness  . Leg Swelling    HISTORY OF PRESENT ILLNESS:  Peytyn Trine  is a 70 y.o. female with a known history of end-stage renal disease resented to the hospital with lower extremity numbness, pain, weakness. Apparently patient has been having those symptoms for the past 3 months. Initially, but those were numb then feeds started hurting and it ascended up to her legs, patient has difficulty walking due to pain and weakness. She did not report any bowel incontinence. On arrival to emergency room, she was noted that her pulses were not palpable, no necrosis or gangrene was noted. Patient was admitted to the hospital for further evaluation and treatment. Vascular surgery consultation was obtained. Dr. Dante Gang saw patient in consultation and recommended angiogram and considered oral antiplatelet therapy  as well as continue statin use. Patient was given aspirin as well as Plavix. However, due to thrombocytopenia, these medications were felt not to be safe. Consultation with neurologist was obtained who felt that patient had polyneuropathy, recommended to initiate Lyrica, get MRI of lumbar spine without contrast. Get outpatient nerve conduction studies/EMG, check vitamin B,  D levels, RPR, SPEP and a TSH, heavy metals, ESR, CK levels. Total CK level was 60, which was within normal limits as well as LDH level, vitamin B12 level was 509, sedimentation rate was elevated at 56, TSH was normal at 2.479, HIV testing was nonreactive, and monitor of lumbar spine without contrast revealed degenerative disc disease, thecal sac narrowing in L3-4 and L4-5 levels. She was seen by physical therapist in skilled nursing facility rehabilitation placement was recommended, where patient will be discharged today Discussion by  problem #1 . Bilateral Lower extremity, feet pain, initially felt to be due to peripheral arterial disease, but most likely polyneuropathy, continue Lyrica at current dose, MRI of lumbar spine revealed degenerative disc disease, thecal sac narrowing in L3-4, L4-5 levels, appreciate neurology recommendations, Vascular surgery recommends follow-up as outpatient and possibly angiogram at a later time.  Continue Plavix and continue statin use, following platelet count closely, as well as hemoglobin level.  . #2 end-stage renal disease on hemodialysis-on Tuesday Thursday Saturday schedule. Last hemodialysis session was 21st of February, next one per schedule  #3 elevated troponin-likely demand ischemia and also from her renal failure due to poor renal clearance. -Last echocardiogram in November 2016 showing LVH, normal ejection fraction and diastolic dysfunction. Patient had no chest pains  #4 atrial fibrillation-off of warfarin due to GI bleed. Rate is low, Coreg was stopped, following heart rate closely, TSH was within normal limits  #5 anemia of chronic disease due to end-stage renal disease. Baseline hemoglobin is around 8. Admission at the end of November requiring transfusion after GI bleed. Warfarin discontinued then. Hemoglobin is 7.0 on admission, patient received 1 unit PRBC transfusion, Hb has improved to 8.3 , now it is declining again, 7.2 today. No bleeding was noted, follow hemoglobin level periodically, Epogen per nephrologist.  #6 recent CVA in November 2016-known history of A. fib and had stroke. However Coumadin has been discontinued for the last couple of months due to acute GI bleed requiring transfusion recently. Discontinued aspirin, continue Plavix due to peripheral vascular disease, Continue statin  #7 hypertension-, patient is off Coreg due to bradycardia, continue losartan, Norvasc , low-dose of hydralazine, blood pressure is still poorly controlled   with systolic blood  pressure ranging between 150s to 160s, advance medications as needed  #8 DVT prophylaxis-holding heparin due to thrombocytopenia  #9. Generalized weakness, patient was seen by physical therapist, SNF placement was recommended.  #10. Pancytopenia, etiology is unclear, as was seen by oncologist, who was concerned about possible bone marrow failure, myelodysplastic syndrome, he recommended to check B93 level, folic acid level, per level, serum protein electrophoresis, free light chains, LDH and reticulocyte count. In regards of thrombocytopenia. He did not feel that it was heparin-induced thrombocytopenia, he recommended to check H. pylori antigen in stool. Results of some studies are back, patient would benefit from a low with Dr. Rudean Hitt.   DISCHARGE CONDITIONS:     VITAL SIGNS:  Blood pressure 149/76, pulse 68, temperature 97.4 F (36.3 C), temperature source Oral, resp. rate 20, height _0  (1.676 m), weight 68.176 kg (150 lb 4.8 oz), SpO2 100 %.  I/O:   Intake/Output Summary (Last  24 hours) at 07/08/15 1433 Last data filed at 07/08/15 1236  Gross per 24 hour  Intake    240 ml  Output   1501 ml  Net  -1261 ml    PHYSICAL EXAMINATION:  GENERAL:  70 y.o.-year-old patient lying in the bed with no acute distress.  EYES: Pupils equal, round, reactive to light and accommodation. No scleral icterus. Extraocular muscles intact.  HEENT: Head atraumatic, normocephalic. Oropharynx and nasopharynx clear.  NECK:  Supple, no jugular venous distention. No thyroid enlargement, no tenderness.  LUNGS: Normal breath sounds bilaterally, no wheezing, rales,rhonchi or crepitation. No use of accessory muscles of respiration.  CARDIOVASCULAR: S1, S2 normal. No murmurs, rubs, or gallops.  ABDOMEN: Soft, non-tender, non-distended. Bowel sounds present. No organomegaly or mass.  EXTREMITIES: No pedal edema, cyanosis, or clubbing.  NEUROLOGIC: Cranial nerves II through XII are intact. Muscle strength 5/5  in all extremities. Sensation intact. Gait not checked.  PSYCHIATRIC: The patient is alert and oriented x 3.  SKIN: No obvious rash, lesion, or ulcer.   DATA REVIEW:   CBC  Recent Labs Lab 07/08/15 0730  WBC 4.1  HGB 7.2*  HCT 22.2*  PLT 121*    Chemistries   Recent Labs Lab 07/02/15 0822  07/04/15 0645  NA 143  < > 140  K 5.0  < > 4.9  CL 104  < > 99*  CO2 23  < > 31  GLUCOSE 88  < > 84  BUN 83*  < > 52*  CREATININE 10.53*  < > 7.31*  CALCIUM 9.7  < > 8.9  AST 18  --   --   ALT 15  --   --   ALKPHOS 245*  --   --   BILITOT 0.8  --   --   < > = values in this interval not displayed.  Cardiac Enzymes  Recent Labs Lab 07/03/15 0426  TROPONINI 0.32*    Microbiology Results  Results for orders placed or performed during the hospital encounter of 07/02/15  MRSA PCR Screening     Status: None   Collection Time: 07/02/15  5:50 PM  Result Value Ref Range Status   MRSA by PCR NEGATIVE NEGATIVE Final    Comment:        The GeneXpert MRSA Assay (FDA approved for NASAL specimens only), is one component of a comprehensive MRSA colonization surveillance program. It is not intended to diagnose MRSA infection nor to guide or monitor treatment for MRSA infections.     RADIOLOGY:  Mr Lumbar Spine Wo Contrast  07/08/2015  CLINICAL DATA:  Low back pain with bilateral leg pain and burning in the feet. Chronic symptoms. EXAM: MRI LUMBAR SPINE WITHOUT CONTRAST TECHNIQUE: Multiplanar, multisequence MR imaging of the lumbar spine was performed. No intravenous contrast was administered. 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 Correlate with patient history. Despite efforts by the technologist and patient, motion artifact is present on today's exam and could not be eliminated. This reduces exam  sensitivity and specificity. Low T2 signal intensity in the liver and spleen, cannot exclude hemochromatosis. Mildly congenitally short pedicles in the lumbar spine. Small, atrophic kidneys. Left periaortic lymph nodes borderline enlarged up to 1 cm.l The lowest lumbar type non-rib-bearing vertebra is labeled as L5. The L5 vertebra has slightly transitional characteristics. Disc desiccation is present throughout the lumbar spine.  Schmorl's node along the superior endplate of L4. Subcutaneous edema along the patient's back. Low-level infiltrative paraspinal edema is suggested diffusely. Additional findings at individual levels are as follows: T12-L1: Unremarkable. L1- 2: Unremarkable. L2- 3:  No impingement.  Mild disc bulge. L3-4: Moderate central narrowing of the thecal sac due to disc bulge. Mild bilateral facet arthropathy. L4-5: Moderate central narrowing of the thecal sac due to disc bulge and short pedicles. Borderline right foraminal stenosis. L5-S1:  Unremarkable. IMPRESSION: 1. Degenerative disc disease and short pedicles, causing moderate central narrowing of the thecal sac at L3-4 and L4-5. 2. 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 3. Low signal intensity in the liver and spleen on T2 weighted images, possible iron overload, correlate with transfusions. 4. Renal atrophy. 5. Diffuse subcutaneous and indistinct paraspinal low-grade edema. Electronically Signed   By: Van Clines M.D.   On: 07/08/2015 11:17    EKG:   Orders placed or performed during the hospital encounter of 07/02/15  . ED EKG  . ED EKG      Management plans discussed with the patient, family and they are in agreement.  CODE STATUS:     Code Status Orders        Start     Ordered   07/02/15 1724  Full code   Continuous     07/02/15 1723    Code Status History    Date Active Date Inactive Code Status Order ID Comments User Context   04/12/2015  1:03  PM 04/15/2015  6:00 PM DNR [id number]  Bettey Costa, MD Inpatient   03/26/2015  4:05 PM 04/01/2015  8:24 PM Full Code [id number]  Gladstone Lighter, MD Inpatient   02/23/2015  9:39 AM 02/23/2015  1:41 PM Full Code [id number]  Algernon Huxley, MD Inpatient      TOTAL TIME TAKING CARE OF THIS PATIENT: 40  minutes.    Theodoro Grist M.D on 07/08/2015 at 2:33 PM  Between 7am to 6pm - Pager - [phone]  After 6pm go to [email or web address] - password [email or web address] [facility]  Office  [phone]  CC: Primary care physician; Murlean Iba, MD

## 2015-07-08 NOTE — Clinical Social Work Placement (Signed)
   CLINICAL SOCIAL WORK PLACEMENT  NOTE  Date:  07/08/2015  Patient Details  Name: Toni Carlson MRN: [id number] Date of Birth: 15-Aug-1945  Clinical Social Work is seeking post-discharge placement for this patient at the [place] level of care (*CSW will initial, date and re-position this form in  chart as items are completed):  Yes   Patient/family provided with [facility] Work Department's list of facilities offering this level of care within the geographic area requested by the patient (or if unable, by the patient's family).  Yes   Patient/family informed of their freedom to choose among providers that offer the needed level of care, that participate in Medicare, Medicaid or managed care program needed by the patient, have an available bed and are willing to accept the patient.  Yes   Patient/family informed of [facility]'s ownership interest in [facility] and [facility], as well as of the fact that they are under no obligation to receive care at these facilities.  PASRR submitted to EDS on       PASRR number received on       Existing PASRR number confirmed on 07/03/15     FL2 transmitted to all facilities in geographic area requested by pt/family on 07/03/15     FL2 transmitted to all facilities within larger geographic area on       Patient informed that his/her managed care company has contracts with or will negotiate with certain facilities, including the following:        Yes   Patient/family informed of bed offers received.  Patient chooses bed at  [facility])     Physician recommends and patient chooses bed at      Patient to be transferred to  [facility]) on 07/08/15.  Patient to be transferred to facility by  [facility])     Patient family notified on 07/08/15 of transfer.  Name of family member notified:   (Tiffany- Daughter)     PHYSICIAN       Additional Comment:     _______________________________________________ Baldemar Lenis, LCSW 07/08/2015, 3:00 PM

## 2015-07-08 NOTE — Care Management Important Message (Signed)
Important Message  Patient Details  Name: Toni Carlson MRN: [id number] Date of Birth: 1945-07-01   Medicare Important Message Given:  Yes    Juliann Pulse A [name] 07/08/2015, 2:41 PM

## 2015-07-08 NOTE — Progress Notes (Signed)
Report called to [facility]/ verbalized an understanding/ iv and tele removed/ EMS called to transport

## 2015-07-08 NOTE — Care Management (Signed)
Plan is discharge to [facility] today.

## 2015-07-08 NOTE — Progress Notes (Signed)
Subjective: Patient reports pain is improved.  No side effects reported.    Objective: Current vital signs: BP 162/81 mmHg  Pulse 58  Temp(Src) 97.4 F (36.3 C) (Oral)  Resp 18  Ht _0  (1.676 m)  Wt 68.176 kg (150 lb 4.8 oz)  BMI 24.27 kg/m2  SpO2 100%  LMP  (LMP Unknown) Vital signs in last 24 hours: Temp:  [97.4 F (36.3 C)-98.7 F (37.1 C)] 97.4 F (36.3 C) (02/22 0552) Pulse Rate:  [40-77] 58 (02/22 0745) Resp:  [13-19] 18 (02/22 0745) BP: (136-162)/(41-81) 162/81 mmHg (02/22 0745) SpO2:  [96 %-100 %] 100 % (02/22 0745) Weight:  [68.176 kg (150 lb 4.8 oz)-71 kg (156 lb 8.4 oz)] 68.176 kg (150 lb 4.8 oz) (02/21 1645)  Intake/Output from previous day: 02/21 0701 - 02/22 0700 In: 360 [P.O.:360] Out: 1501 [Urine:1] Intake/Output this shift: Total I/O In: 240 [P.O.:240] Out: -  Nutritional status: Diet renal with fluid restriction Fluid restriction:: 1200 mL Fluid; Room service appropriate?: Yes; Fluid consistency:: Thin  Neurologic Exam: Mental Status: Alert, oriented, thought content appropriate. Speech fluent without evidence of aphasia. Able to follow 3 step commands without difficulty. Motor: 5/5 in the BUE's. Able to lift both legs off the bed ana able to extend at the knees  Sensory: Pinprick and light touch intact throughout, bilaterally. Decreased vibratory sensation in the RLE to the knee and to the ankle in the LLE. Deep Tendon Reflexes: 1+ in the upper extremities and absent in the lower extremities. Plantars: Right: muteLeft: mute Cerebellar: Normal finger-to-nose testing. Unable to perform heel-to-shin testing Gait: not tested due to safety concerns   Lab Results: Basic Metabolic Panel:  Recent Labs Lab 07/02/15 0822 07/03/15 0426 07/04/15 0645  NA 143 140 140  K 5.0 4.0 4.9  CL 104 102 99*  CO2 _1 GLUCOSE 88 74 84  BUN 83* 39* 52*  CREATININE 10.53* 5.77* 7.31*  CALCIUM 9.7 9.1 8.9    Liver  Function Tests:  Recent Labs Lab 07/02/15 0822  AST 18  ALT 15  ALKPHOS 245*  BILITOT 0.8  PROT 7.0  ALBUMIN 3.5   No results for input(s): LIPASE, AMYLASE in the last 168 hours. No results for input(s): AMMONIA in the last 168 hours.  CBC:  Recent Labs Lab 07/02/15 0822  07/04/15 0645 07/05/15 0530 07/06/15 0455 07/07/15 0405 07/08/15 0730  WBC 7.1  < > 5.6 3.5* 2.9* 4.4 4.1  NEUTROABS 6.2  --   --   --   --   --  3.3  HGB 7.9*  < > 8.8* 8.3* 7.9* 7.4* 7.2*  HCT 25.6*  < > 27.1* 25.8* 24.7* 23.3* 22.2*  MCV 94.6  < > 92.2 93.5 92.9 91.1 90.7  PLT 186  < > 135* 117* 106* 105* 121*  < > = values in this interval not displayed.  Cardiac Enzymes:  Recent Labs Lab 07/02/15 0822 07/02/15 1805 07/02/15 2309 07/03/15 0426 07/07/15 1157  CKTOTAL  --   --   --   --  60  TROPONINI 0.40* 0.30* 0.33* 0.32*  --     Lipid Panel: No results for input(s): CHOL, TRIG, HDL, CHOLHDL, VLDL, LDLCALC in the last 168 hours.  CBG: No results for input(s): GLUCAP in the last 168 hours.  Microbiology: Results for orders placed or performed during the hospital encounter of 07/02/15  MRSA PCR Screening     Status: None   Collection Time: 07/02/15  5:50 PM  Result  Value Ref Range Status   MRSA by PCR NEGATIVE NEGATIVE Final    Comment:        The GeneXpert MRSA Assay (FDA approved for NASAL specimens only), is one component of a comprehensive MRSA colonization surveillance program. It is not intended to diagnose MRSA infection nor to guide or monitor treatment for MRSA infections.     Coagulation Studies:  Recent Labs  07/08/15 0730  LABPROT 14.4  INR 1.10    Imaging: Mr Lumbar Spine Wo Contrast  07/08/2015  CLINICAL DATA:  Low back pain with bilateral leg pain and burning in the feet. Chronic symptoms. EXAM: MRI LUMBAR SPINE WITHOUT CONTRAST TECHNIQUE: Multiplanar, multisequence MR imaging of the lumbar spine was performed. No intravenous contrast was  administered. 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 Correlate with patient history. Despite efforts by the technologist and patient, motion artifact is present on today's exam and could not be eliminated. This reduces exam sensitivity and specificity. Low T2 signal intensity in the liver and spleen, cannot exclude hemochromatosis. Mildly congenitally short pedicles in the lumbar spine. Small, atrophic kidneys. Left periaortic lymph nodes borderline enlarged up to 1 cm.l The lowest lumbar type non-rib-bearing vertebra is labeled as L5. The L5 vertebra has slightly transitional characteristics. Disc desiccation is present throughout the lumbar spine. Schmorl's node along the superior endplate of L4. Subcutaneous edema along the patient's back. Low-level infiltrative paraspinal edema is suggested diffusely. Additional findings at individual levels are as follows: T12-L1: Unremarkable. L1- 2: Unremarkable. L2- 3:  No impingement.  Mild disc bulge. L3-4: Moderate central narrowing of the thecal sac due to disc bulge. Mild bilateral facet arthropathy. L4-5: Moderate central narrowing of the thecal sac due to disc bulge and short pedicles. Borderline right foraminal stenosis. L5-S1:  Unremarkable. IMPRESSION: 1. Degenerative disc disease and short pedicles, causing moderate central narrowing of the thecal sac at L3-4 and L4-5. 2. 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 3. Low signal intensity in the liver and spleen on T2 weighted images, possible iron overload, correlate with transfusions. 4. Renal atrophy. 5. Diffuse subcutaneous and indistinct paraspinal low-grade edema. Electronically Signed   By: Van Clines M.D.   On: 07/08/2015 11:17    Medications:  I have reviewed  the patient's current medications. Scheduled: . amLODipine  10 mg Oral Daily  . atorvastatin  40 mg Oral q1800  . calcium acetate (Phos Binder)  1,334 mg Oral TID WC  . capsaicin   Topical BID  . cinacalcet  60 mg Oral Q supper  . clopidogrel  75 mg Oral Daily  . diclofenac sodium  2 g Topical QID  . epoetin (EPOGEN/PROCRIT) injection  10,000 Units Intravenous Q T,Th,Sa-HD  . feeding supplement (ENSURE ENLIVE)  237 mL Oral TID WC  . hydrALAZINE  25 mg Oral TID  . iron sucrose  200 mg Intravenous Q T,Th,Sa-HD  . losartan  100 mg Oral Daily  . pantoprazole  40 mg Oral Daily  . pregabalin  25 mg Oral BID  . sodium chloride flush  3 mL Intravenous Q12H    Assessment/Plan: Pain improving on Lyrica.  TSH and CK normal.  ESR 56.  Remaining lab work pending.    Recommendations: 1.  Would continue Lyrica at current dose 2.  Patient will still benefit from a NCV/EMG on an outpatient basis  LOS: 6 days   Alexis Goodell, MD Neurology [phone] 07/08/2015  11:48 AM

## 2015-07-08 NOTE — Progress Notes (Signed)
Clinical Social Worker was informed that patient will be medically ready to discharge to [facility]. Patient is in a agreement with plan. CSW called Helene Kelp- Admissions coordinator at [facility] to confirm that patient's bed is ready. Provided patient's room number 29A and number to call for report [phone] . All discharge information faxed to [facility] via [facility] . Rx's added to discharge packet.   Call to patient's daughter-Tiffany, left message to inform her patient would discharge to [facility]. RN will call report and patient will discharge to [facility] via [facility].   Ernest Pine, MSW, [place] Social Work Department [phone]

## 2015-07-09 LAB — IMMUNOFIXATION ELECTROPHORESIS
IGA: 169 mg/dL (ref 87–352)
IGG (IMMUNOGLOBIN G), SERUM: 840 mg/dL (ref 700–1600)
IgA: 169 mg/dL (ref 87–352)
IgG (Immunoglobin G), Serum: 840 mg/dL (ref 700–1600)
IgM, Serum: 25 mg/dL — ABNORMAL LOW (ref 26–217)
IgM, Serum: 25 mg/dL — ABNORMAL LOW (ref 26–217)
TOTAL PROTEIN ELP: 5.4 g/dL — AB (ref 6.0–8.5)
Total Protein ELP: 5.4 g/dL — ABNORMAL LOW (ref 6.0–8.5)

## 2015-07-09 LAB — HEAVY METALS, BLOOD
ARSENIC: 8 ug/L (ref 2–23)
LEAD: 2 ug/dL (ref 0–19)
MERCURY: NOT DETECTED ug/L (ref 0.0–14.9)

## 2015-07-09 LAB — VITAMIN D 25 HYDROXY (VIT D DEFICIENCY, FRACTURES): VIT D 25 HYDROXY: 11.5 ng/mL — AB (ref 30.0–100.0)

## 2015-07-09 LAB — VITAMIN B1: VITAMIN B1 (THIAMINE): 105.7 nmol/L (ref 66.5–200.0)

## 2015-07-09 LAB — SJOGRENS SYNDROME-B EXTRACTABLE NUCLEAR ANTIBODY: SSB (La) (ENA) Antibody, IgG: <0.2 AI (ref 0.0–0.9)

## 2015-07-09 LAB — SJOGRENS SYNDROME-A EXTRACTABLE NUCLEAR ANTIBODY: SSA (Ro) (ENA) Antibody, IgG: <0.2 AI (ref 0.0–0.9)

## 2015-07-09 LAB — RPR: RPR: NONREACTIVE

## 2015-07-10 LAB — COPPER, SERUM: Copper: 153 ug/dL (ref 72–166)

## 2015-07-10 LAB — FOLATE RBC
FOLATE, RBC: 1481 ng/mL (ref >498)
Folate, Hemolysate: 339.1 ng/mL
Hematocrit: 22.9 % — ABNORMAL LOW (ref 34.0–46.6)

## 2015-07-10 LAB — KAPPA/LAMBDA LIGHT CHAINS
KAPPA, LAMDA LIGHT CHAIN RATIO: 1.75 — AB (ref 0.26–1.65)
Kappa free light chain: 302.48 mg/L — ABNORMAL HIGH (ref 3.30–19.40)
Lambda free light chains: 173.29 mg/L — ABNORMAL HIGH (ref 5.71–26.30)

## 2015-07-14 ENCOUNTER — Other Ambulatory Visit
Admission: RE | Admit: 2015-07-14 | Discharge: 2015-07-14 | Disposition: A | Discharge status: Home / Self Care | Payer: Medicare Other | Source: Ambulatory Visit | Attending: Internal Medicine | Admitting: Internal Medicine

## 2015-07-14 DIAGNOSIS — N186 End stage renal disease: Secondary | ICD-10-CM | POA: Insufficient documentation

## 2015-07-14 LAB — HEMOGLOBIN: Hemoglobin: 5.3 g/dL — ABNORMAL LOW (ref 12.0–16.0)

## 2015-07-15 ENCOUNTER — Inpatient Hospital Stay
Admission: EM | Admit: 2015-07-15 | Discharge: 2015-07-17 | DRG: 377 | Disposition: A | Discharge status: Skilled Nursing Facility | Payer: Medicare Other | Attending: Specialist | Admitting: Specialist

## 2015-07-15 ENCOUNTER — Encounter: Payer: Self-pay | Admitting: Emergency Medicine

## 2015-07-15 DIAGNOSIS — I272 Other secondary pulmonary hypertension: Secondary | ICD-10-CM | POA: Diagnosis present

## 2015-07-15 DIAGNOSIS — Z87891 Personal history of nicotine dependence: Secondary | ICD-10-CM

## 2015-07-15 DIAGNOSIS — N2581 Secondary hyperparathyroidism of renal origin: Secondary | ICD-10-CM | POA: Diagnosis present

## 2015-07-15 DIAGNOSIS — K922 Gastrointestinal hemorrhage, unspecified: Secondary | ICD-10-CM | POA: Diagnosis present

## 2015-07-15 DIAGNOSIS — I69392 Facial weakness following cerebral infarction: Secondary | ICD-10-CM | POA: Diagnosis not present

## 2015-07-15 DIAGNOSIS — D631 Anemia in chronic kidney disease: Secondary | ICD-10-CM | POA: Diagnosis present

## 2015-07-15 DIAGNOSIS — M79606 Pain in leg, unspecified: Secondary | ICD-10-CM | POA: Diagnosis present

## 2015-07-15 DIAGNOSIS — K219 Gastro-esophageal reflux disease without esophagitis: Secondary | ICD-10-CM | POA: Diagnosis present

## 2015-07-15 DIAGNOSIS — E785 Hyperlipidemia, unspecified: Secondary | ICD-10-CM | POA: Diagnosis present

## 2015-07-15 DIAGNOSIS — Z79899 Other long term (current) drug therapy: Secondary | ICD-10-CM

## 2015-07-15 DIAGNOSIS — I6529 Occlusion and stenosis of unspecified carotid artery: Secondary | ICD-10-CM | POA: Diagnosis present

## 2015-07-15 DIAGNOSIS — Z8711 Personal history of peptic ulcer disease: Secondary | ICD-10-CM

## 2015-07-15 DIAGNOSIS — I2510 Atherosclerotic heart disease of native coronary artery without angina pectoris: Secondary | ICD-10-CM | POA: Diagnosis present

## 2015-07-15 DIAGNOSIS — D638 Anemia in other chronic diseases classified elsewhere: Secondary | ICD-10-CM

## 2015-07-15 DIAGNOSIS — I5032 Chronic diastolic (congestive) heart failure: Secondary | ICD-10-CM | POA: Diagnosis present

## 2015-07-15 DIAGNOSIS — Z992 Dependence on renal dialysis: Secondary | ICD-10-CM

## 2015-07-15 DIAGNOSIS — K921 Melena: Principal | ICD-10-CM | POA: Diagnosis present

## 2015-07-15 DIAGNOSIS — D649 Anemia, unspecified: Secondary | ICD-10-CM | POA: Diagnosis present

## 2015-07-15 DIAGNOSIS — K635 Polyp of colon: Secondary | ICD-10-CM | POA: Diagnosis present

## 2015-07-15 DIAGNOSIS — Z7902 Long term (current) use of antithrombotics/antiplatelets: Secondary | ICD-10-CM | POA: Diagnosis not present

## 2015-07-15 DIAGNOSIS — D500 Iron deficiency anemia secondary to blood loss (chronic): Secondary | ICD-10-CM | POA: Diagnosis present

## 2015-07-15 DIAGNOSIS — Z79891 Long term (current) use of opiate analgesic: Secondary | ICD-10-CM | POA: Diagnosis not present

## 2015-07-15 DIAGNOSIS — D62 Acute posthemorrhagic anemia: Secondary | ICD-10-CM | POA: Diagnosis present

## 2015-07-15 DIAGNOSIS — I69328 Other speech and language deficits following cerebral infarction: Secondary | ICD-10-CM

## 2015-07-15 DIAGNOSIS — I132 Hypertensive heart and chronic kidney disease with heart failure and with stage 5 chronic kidney disease, or end stage renal disease: Secondary | ICD-10-CM | POA: Diagnosis present

## 2015-07-15 DIAGNOSIS — I70209 Unspecified atherosclerosis of native arteries of extremities, unspecified extremity: Secondary | ICD-10-CM | POA: Diagnosis present

## 2015-07-15 DIAGNOSIS — N186 End stage renal disease: Secondary | ICD-10-CM | POA: Diagnosis present

## 2015-07-15 DIAGNOSIS — I4891 Unspecified atrial fibrillation: Secondary | ICD-10-CM | POA: Diagnosis present

## 2015-07-15 LAB — IRON AND TIBC
Iron: 28 ug/dL (ref 28–170)
SATURATION RATIOS: 10 % — AB (ref 10.4–31.8)
TIBC: 283 ug/dL (ref 250–450)
UIBC: 255 ug/dL

## 2015-07-15 LAB — OCCULT BLOOD X 1 CARD TO LAB, STOOL: FECAL OCCULT BLD: POSITIVE — AB

## 2015-07-15 LAB — CBC
HEMATOCRIT: 19.2 % — AB (ref 35.0–47.0)
HEMOGLOBIN: 5.9 g/dL — AB (ref 12.0–16.0)
MCH: 29.7 pg (ref 26.0–34.0)
MCHC: 30.8 g/dL — AB (ref 32.0–36.0)
MCV: 96.4 fL (ref 80.0–100.0)
Platelets: 296 10*3/uL (ref 150–440)
RBC: 1.99 MIL/uL — AB (ref 3.80–5.20)
RDW: 19.3 % — ABNORMAL HIGH (ref 11.5–14.5)
WBC: 7.4 10*3/uL (ref 3.6–11.0)

## 2015-07-15 LAB — PROTIME-INR
INR: 1.14
Prothrombin Time: 14.8 seconds (ref 11.4–15.0)

## 2015-07-15 LAB — BASIC METABOLIC PANEL
ANION GAP: 9 (ref 5–15)
BUN: 35 mg/dL — ABNORMAL HIGH (ref 6–20)
CHLORIDE: 102 mmol/L (ref 101–111)
CO2: 28 mmol/L (ref 22–32)
CREATININE: 4.67 mg/dL — AB (ref 0.44–1.00)
Calcium: 7.4 mg/dL — ABNORMAL LOW (ref 8.9–10.3)
GFR calc non Af Amer: 9 mL/min — ABNORMAL LOW (ref >60)
GFR, EST AFRICAN AMERICAN: 10 mL/min — AB (ref >60)
Glucose, Bld: 91 mg/dL (ref 65–99)
Potassium: 4 mmol/L (ref 3.5–5.1)
SODIUM: 139 mmol/L (ref 135–145)

## 2015-07-15 LAB — FERRITIN: FERRITIN: 538 ng/mL — AB (ref 11–307)

## 2015-07-15 LAB — PREPARE RBC (CROSSMATCH)

## 2015-07-15 LAB — APTT: APTT: 35 s (ref 24–36)

## 2015-07-15 LAB — MRSA PCR SCREENING: MRSA by PCR: NEGATIVE

## 2015-07-15 MED ORDER — SODIUM CHLORIDE 0.9 % IV SOLN
Freq: Once | INTRAVENOUS | Status: AC
  Administered 2015-07-15: 17:00:00 via INTRAVENOUS

## 2015-07-15 MED ORDER — SODIUM CHLORIDE 0.9% FLUSH
3.0000 mL | Freq: Two times a day (BID) | INTRAVENOUS | Status: DC
  Administered 2015-07-17: 3 mL via INTRAVENOUS

## 2015-07-15 MED ORDER — ACETAMINOPHEN 650 MG RE SUPP: 650.0000 mg | Freq: Four times a day (QID) | RECTAL | Status: DC | PRN

## 2015-07-15 MED ORDER — SODIUM CHLORIDE 0.9% FLUSH
3.0000 mL | Freq: Two times a day (BID) | INTRAVENOUS | Status: DC
  Administered 2015-07-16: 3 mL via INTRAVENOUS

## 2015-07-15 MED ORDER — HYDRALAZINE HCL 25 MG PO TABS
25.0000 mg | ORAL_TABLET | Freq: Three times a day (TID) | ORAL | Status: DC
  Administered 2015-07-16 – 2015-07-17 (×2): 25 mg via ORAL
  Filled 2015-07-15 (×2): qty 1

## 2015-07-15 MED ORDER — CALCIUM ACETATE (PHOS BINDER) 667 MG/5ML PO SOLN
1334.0000 mg | Freq: Three times a day (TID) | ORAL | Status: DC
  Filled 2015-07-15 (×9): qty 10

## 2015-07-15 MED ORDER — PANTOPRAZOLE SODIUM 40 MG IV SOLR
40.0000 mg | Freq: Two times a day (BID) | INTRAVENOUS | Status: DC
  Administered 2015-07-15 – 2015-07-17 (×4): 40 mg via INTRAVENOUS
  Filled 2015-07-15 (×4): qty 40

## 2015-07-15 MED ORDER — SODIUM CHLORIDE 0.9% FLUSH
3.0000 mL | INTRAVENOUS | Status: DC | PRN
Start: 2015-07-15 — End: 2015-07-17
  Administered 2015-07-16: 3 mL via INTRAVENOUS
  Filled 2015-07-15: qty 3

## 2015-07-15 MED ORDER — CARVEDILOL 6.25 MG PO TABS
6.2500 mg | ORAL_TABLET | Freq: Two times a day (BID) | ORAL | Status: DC
  Administered 2015-07-17: 6.25 mg via ORAL
  Filled 2015-07-15: qty 1

## 2015-07-15 MED ORDER — BISACODYL 5 MG PO TBEC
5.0000 mg | DELAYED_RELEASE_TABLET | Freq: Every day | ORAL | Status: DC | PRN
  Filled 2015-07-15: qty 1

## 2015-07-15 MED ORDER — ACETAMINOPHEN 325 MG PO TABS: 650.0000 mg | ORAL_TABLET | Freq: Four times a day (QID) | ORAL | Status: DC | PRN

## 2015-07-15 MED ORDER — OXYCODONE-ACETAMINOPHEN 5-325 MG PO TABS: 1.0000 | ORAL_TABLET | ORAL | Status: DC | PRN

## 2015-07-15 MED ORDER — ONDANSETRON HCL 4 MG PO TABS: 4.0000 mg | ORAL_TABLET | Freq: Four times a day (QID) | ORAL | Status: DC | PRN

## 2015-07-15 MED ORDER — POLYETHYLENE GLYCOL 3350 17 G PO PACK: 17.0000 g | PACK | Freq: Every day | ORAL | Status: DC | PRN

## 2015-07-15 MED ORDER — ONDANSETRON HCL 4 MG/2ML IJ SOLN
4.0000 mg | Freq: Four times a day (QID) | INTRAMUSCULAR | Status: DC | PRN
Start: 2015-07-15 — End: 2015-07-17

## 2015-07-15 MED ORDER — PREGABALIN 25 MG PO CAPS
25.0000 mg | ORAL_CAPSULE | Freq: Two times a day (BID) | ORAL | Status: DC
  Administered 2015-07-15 – 2015-07-17 (×4): 25 mg via ORAL
  Filled 2015-07-15 (×4): qty 1

## 2015-07-15 MED ORDER — ATORVASTATIN CALCIUM 20 MG PO TABS
40.0000 mg | ORAL_TABLET | Freq: Every day | ORAL | Status: DC
  Administered 2015-07-16 – 2015-07-17 (×2): 40 mg via ORAL
  Filled 2015-07-15 (×3): qty 2

## 2015-07-15 MED ORDER — SODIUM CHLORIDE 0.9 % IV SOLN
250.0000 mL | INTRAVENOUS | Status: DC | PRN
  Administered 2015-07-17: 15:00:00 via INTRAVENOUS

## 2015-07-15 MED ORDER — AMLODIPINE BESYLATE 10 MG PO TABS
10.0000 mg | ORAL_TABLET | Freq: Every day | ORAL | Status: DC
  Administered 2015-07-17: 10 mg via ORAL
  Filled 2015-07-15: qty 1

## 2015-07-15 MED ORDER — CINACALCET HCL 30 MG PO TABS
60.0000 mg | ORAL_TABLET | Freq: Every day | ORAL | Status: DC
  Filled 2015-07-15 (×2): qty 2

## 2015-07-15 NOTE — ED Provider Notes (Signed)
[facility] Emergency Department Provider Note  ____________________________________________  Time seen: Approximately 11:47 AM  I have reviewed the triage vital signs and the nursing notes.   HISTORY  Chief Complaint abnormal labs     HPI Toni Carlson is a 70 y.o. female history of end-stage renal disease, anemia, coronary disease, A. fib.  Patient had labs drawn at hemodialysis yesterday and reports that her hemoglobin came back low. She states she has had just slightly increased shortness of breath from her normal and denies any other symptoms. She says she always feels weak and fatigued. No nausea or vomiting. Denies blood in her stool. She reports she has had blood transfusions in the past and last week she had one a couple months ago.  No pain. Denies shortness of breath while sitting still.   Past Medical History  Diagnosis Date  . Hypertension   . ESRD (end stage renal disease) ([place])     On Tue-Thur-Sat dialysis  . Chronic diastolic CHF (congestive heart failure) ([place])     a. echo 03/2015: EF 60-65%, normal wall motion, diastolic parameters were c/w restrictive physiology, indicative of decreased LV diastolic compliance and/or increased LA pressure, mild AS, mild MR, left atrium was severely dilated, RA was severely dilated, PASP was severely increased at 90 mm Hg  . Anemia   . Hyperparathyroidism, secondary renal ([place])   . Coronary artery disease, non-occlusive     a. cardiac cath 2013  . H/O ischemic right MCA stroke     a. 03/2015; b. residual left-sided facial droop and slurred speech  . A-fib (H[facility])     a. on warfarin  . Pulmonary HTN ([place])     as above  . Symptomatic bradycardia     a. history of symptomatic bradycardia; b. felt to be 2/2 metabolic abnormalities & required temp wire but no PPM, resolved with HD  . Carotid artery stenosis     a. ultrasound 03/2015: RICA 0000000 stenosis, LICA less than [id number] stenosis    Patient Active  Problem List   Diagnosis Date Noted  . Anemia 07/15/2015  . Polyneuropathy ([place]) 07/08/2015  . Degenerative disc disease, lumbar 07/08/2015  . Pancytopenia ([place]) 07/08/2015  . Generalized weakness 07/08/2015  . End stage renal disease ([place]) 07/08/2015  . Atherosclerotic peripheral vascular disease ([place]) 07/08/2015  . Pain in both feet 07/02/2015  . Gastric ulceration   . Acute GI bleeding   . GIB (gastrointestinal bleeding) 04/12/2015  . Left-sided weakness 04/01/2015  . Essential hypertension, malignant 04/01/2015  . Anemia of chronic disease 04/01/2015  . Elevated troponin 04/01/2015  . Pulmonary hypertension ([place]) 04/01/2015  . Atrial fibrillation ([place])   . CVA (cerebral infarction) 03/26/2015    Past Surgical History  Procedure Laterality Date  . Peripheral vascular catheterization N/A 02/23/2015    Procedure: A/V Shuntogram/Fistulagram;  Surgeon: Algernon Huxley, MD;  Location: [place] CV LAB;  Service: Cardiovascular;  Laterality: N/A;  . Peripheral vascular catheterization N/A 02/23/2015    Procedure: A/V Shunt Intervention;  Surgeon: Algernon Huxley, MD;  Location: [place] CV [facility];  Service: Cardiovascular;  Laterality: N/A;  . Esophagogastroduodenoscopy (egd) with propofol N/A 04/14/2015    Procedure: ESOPHAGOGASTRODUODENOSCOPY (EGD) WITH PROPOFOL;  Surgeon: Lucilla Lame, MD;  Location: [facility] ENDOSCOPY;  Service: Endoscopy;  Laterality: N/A;    Current Outpatient Rx  Name  Route  Sig  Dispense  Refill  . amLODipine (NORVASC) 10 MG tablet   Oral   Take 10 mg by  mouth daily.         Marland Kitchen atorvastatin (LIPITOR) 40 MG tablet   Oral   Take 1 tablet (40 mg total) by mouth daily at 6 PM.   30 tablet   6   . cinacalcet (SENSIPAR) 30 MG tablet   Oral   Take 2 tablets (60 mg total) by mouth daily with supper.   60 tablet   6   . clopidogrel (PLAVIX) 75 MG tablet   Oral   Take 1 tablet (75 mg total) by mouth daily.   30 tablet   5   . feeding supplement, ENSURE  COMPLETE, (ENSURE COMPLETE) LIQD   Oral   Take 237 mLs by mouth 3 (three) times daily with meals.   90 Bottle   6   . hydrALAZINE (APRESOLINE) 25 MG tablet   Oral   Take 25 mg by mouth 3 (three) times daily.          Marland Kitchen losartan (COZAAR) 100 MG tablet   Oral   Take 100 mg by mouth daily.         Marland Kitchen omeprazole (PRILOSEC) 20 MG capsule   Oral   Take 1 capsule by mouth daily.      0   . oxyCODONE-acetaminophen (PERCOCET/ROXICET) 5-325 MG tablet   Oral   Take 1 tablet by mouth every 4 (four) hours as needed for moderate pain.   30 tablet   0   . PHOSLYRA 667 MG/5ML SOLN   Oral   Take 1,334 mg by mouth 3 (three) times daily with meals.            Dispense as written.   . polyethylene glycol (MIRALAX / GLYCOLAX) packet   Oral   Take 17 g by mouth daily as needed for mild constipation.   14 each   0   . pregabalin (LYRICA) 25 MG capsule   Oral   Take 1 capsule (25 mg total) by mouth 2 (two) times daily.   60 capsule   5   . trolamine salicylate (ASPERCREME) 10 % cream   Topical   Apply topically 2 (two) times daily as needed for muscle pain. Patient not taking: Reported on 07/02/2015   85 g   0   . VOLTAREN 1 % GEL   Topical   Apply 2 g topically 4 (four) times daily.            Dispense as written.     Allergies Review of patient's allergies indicates no known allergies.  Family History  Problem Relation Age of Onset  . CAD Father   . Dementia Mother     Social History Social History  Substance Use Topics  . Smoking status: Former Smoker -- 0.25 packs/day for 30 years    Types: Cigarettes  . Smokeless tobacco: Never Used  . Alcohol Use: No    Review of Systems Constitutional: No fever/chills Eyes: No visual changes. ENT: No sore throat. Cardiovascular: Denies chest pain. Respiratory: Denies shortness of breath except for fairly chronic symptoms just slightly worse for the last couple days when she exerts self. Gastrointestinal: No  abdominal pain.  No nausea, no vomiting.  No diarrhea.  No constipation. No black or bloody stool. Musculoskeletal: Negative for back pain. Skin: Negative for rash. Neurological: Negative for headaches, focal weakness or numbness.  10-point ROS otherwise negative.  ____________________________________________   PHYSICAL EXAM:  VITAL SIGNS: ED Triage Vitals  Enc Vitals Group     BP 07/15/15 1131  133/50 mmHg     Pulse Rate 07/15/15 1131 103     Resp 07/15/15 1131 2     Temp 07/15/15 1131 97.7 F (36.5 C)     Temp Source 07/15/15 1131 Oral     SpO2 07/15/15 1131 100 %     Weight 07/15/15 1131 152 lb (68.947 kg)     Height 07/15/15 1131 5\' 6"  (1.676 m)     Head Cir --      Peak Flow --      Pain Score 07/15/15 1132 0     Pain Loc --      Pain Edu? --      Excl. in [place]? --    Constitutional: Alert and oriented. Well appearing and in no acute distress. Eyes: Conjunctivae are normal. PERRL. EOMI. Head: Atraumatic. Nose: No congestion/rhinnorhea. Mouth/Throat: Mucous membranes are moist.  Oropharynx non-erythematous. Neck: No stridor.   Cardiovascular: Normal rate, regular rhythm. Grossly normal heart sounds.  Good peripheral circulation. Respiratory: Normal respiratory effort.  No retractions. Lungs CTAB. Gastrointestinal: Soft and nontender. No distention. No abdominal bruits. No CVA tenderness. Musculoskeletal: No lower extremity tenderness nor edema.  No joint effusions. Neurologic:  Normal speech and language. No gross focal neurologic deficits are appreciated. No gait instability. Skin:  Skin is warm, dry and intact. No rash noted. Psychiatric: Mood and affect are normal. Speech and behavior are normal.  ____________________________________________   LABS (all labs ordered are listed, but only abnormal results are displayed)  Labs Reviewed  CBC - Abnormal; Notable for the following:    RBC 1.99 (*)    Hemoglobin 5.9 (*)    HCT 19.2 (*)    MCHC 30.8 (*)    RDW 19.3  (*)    All other components within normal limits  BASIC METABOLIC PANEL - Abnormal; Notable for the following:    BUN 35 (*)    Creatinine, Ser 4.67 (*)    Calcium 7.4 (*)    GFR calc non Af Amer 9 (*)    GFR calc Af Amer 10 (*)    All other components within normal limits  PROTIME-INR  APTT  OCCULT BLOOD X 1 CARD TO LAB, STOOL  TYPE AND SCREEN  TYPE AND SCREEN  ABO/RH  ANTIBODY SCREEN  PREPARE RBC (CROSSMATCH)   ____________________________________________  EKG   ____________________________________________  RADIOLOGY   ____________________________________________   PROCEDURES  Procedure(s) performed: None  Critical Care performed: No  ____________________________________________   INITIAL IMPRESSION / ASSESSMENT AND PLAN / ED COURSE  Pertinent labs & imaging results that were available during my care of the patient were reviewed by me and considered in my medical decision making (see chart for details).  Patient presents for evaluation of anemia. She has had previous extensive workups, and denies any signs or symptoms of acute bleeding. She is stable nontoxic appearing. Admit the patient for blood transfusion as hemoglobin is less than 7 and she does endorse some mild dyspnea. Hospitalist service is contacted. Patient agreeable. ____________________________________________   FINAL CLINICAL IMPRESSION(S) / ED DIAGNOSES  Final diagnoses:  Anemia in chronic illness      Delman Kitten, MD 07/15/15 1319

## 2015-07-15 NOTE — Consult Note (Signed)
GI Inpatient Consult Note  Reason for Consult: Anemia, Heme + stool   Attending Requesting Consult: Sudini  History of Present Illness: Toni Carlson is a 70 y.o. female with a history of ESRD d/t mebranous glomerulonephritis, chronic diastolic HF, h/o CVA, pulmonary HTN, CAD, and Afib previously on warfarin admitted for symptomatic anemia.  Patient states she was in her usual state of health at dialysis when she learned her Hgb was low after routine labs were drawn.  She was encouraged to go to the ED for further evaluation, as she has as a h/o of symptomatic anemia requiring blood transfusions.  Upon further questioning, patient admits to some mildly increased SOB over the last week or so.  Patient also endorses some baseline weakness and fatigue, that has not worsened recently.  She denies CP, lightheadedness, or dizziness.  Patient also notes her stools can be "black sometimes, normal other times".  In the ED, patient had a BM that was black and tarry; FOBT returned heme +.  Review of records in Epic show patient underwent an EGD to evaluate melena in 03/2015; this revealed a non-bleeding gastric ulcer.  Patient states she continues to take Prilosec 20mg  daily and avoid NSAIDs.  Her last colonoscopy was "many years ago".  She denies current epigastric pain, nausea, vomiting, or hematemesis.  Of note, patient's warfarin was d/c during last admission d/t recurrent drops in Hgb.  Upon arrival to the ED, labs revealed Hgb 5.3 (5.9 on full CBC), Hct 19.2, MCV 96.4.  Two units PRBCs have been ordered.  GI consultation was ordered for further management.  Past Medical History:  Past Medical History  Diagnosis Date  . Hypertension   . ESRD (end stage renal disease) ([place])     On Tue-Thur-Sat dialysis  . Chronic diastolic CHF (congestive heart failure) ([place])     a. echo 03/2015: EF 60-65%, normal wall motion, diastolic parameters were c/w restrictive physiology, indicative of decreased LV diastolic  compliance and/or increased LA pressure, mild AS, mild MR, left atrium was severely dilated, RA was severely dilated, PASP was severely increased at 90 mm Hg  . Anemia   . Hyperparathyroidism, secondary renal ([place])   . Coronary artery disease, non-occlusive     a. cardiac cath 2013  . H/O ischemic right MCA stroke     a. 03/2015; b. residual left-sided facial droop and slurred speech  . A-fib (HCC)     a. on warfarin  . Pulmonary HTN ([place])     as above  . Symptomatic bradycardia     a. history of symptomatic bradycardia; b. felt to be 2/2 metabolic abnormalities & required temp wire but no PPM, resolved with HD  . Carotid artery stenosis     a. ultrasound 03/2015: RICA 0000000 stenosis, LICA less than [id number] stenosis    Problem List: Patient Active Problem List   Diagnosis Date Noted  . Anemia 07/15/2015  . GI bleed 07/15/2015  . Polyneuropathy ([place]) 07/08/2015  . Degenerative disc disease, lumbar 07/08/2015  . Pancytopenia ([place]) 07/08/2015  . Generalized weakness 07/08/2015  . End stage renal disease ([place]) 07/08/2015  . Atherosclerotic peripheral vascular disease ([place]) 07/08/2015  . Pain in both feet 07/02/2015  . Gastric ulceration   . Acute GI bleeding   . GIB (gastrointestinal bleeding) 04/12/2015  . Left-sided weakness 04/01/2015  . Essential hypertension, malignant 04/01/2015  . Anemia of chronic disease 04/01/2015  . Elevated troponin 04/01/2015  . Pulmonary hypertension ([place]) 04/01/2015  . Atrial fibrillation ([place])   .  CVA (cerebral infarction) 03/26/2015    Past Surgical History: Past Surgical History  Procedure Laterality Date  . Peripheral vascular catheterization N/A 02/23/2015    Procedure: A/V Shuntogram/Fistulagram;  Surgeon: Algernon Huxley, MD;  Location: [facility] [place];  Service: Cardiovascular;  Laterality: N/A;  . Peripheral vascular catheterization N/A 02/23/2015    Procedure: A/V Shunt Intervention;  Surgeon: Algernon Huxley, MD;  Location: [place]  CV [facility];  Service: Cardiovascular;  Laterality: N/A;  . Esophagogastroduodenoscopy (egd) with propofol N/A 04/14/2015    Procedure: ESOPHAGOGASTRODUODENOSCOPY (EGD) WITH PROPOFOL;  Surgeon: Lucilla Lame, MD;  Location: [facility] ENDOSCOPY;  Service: Endoscopy;  Laterality: N/A;    Allergies: No Known Allergies  Home Medications: Prescriptions prior to admission  Medication Sig Dispense Refill Last Dose  . amLODipine (NORVASC) 10 MG tablet Take 10 mg by mouth daily.   unknown at unknown  . atorvastatin (LIPITOR) 40 MG tablet Take 40 mg by mouth daily.   unknown at unknown  . calcium acetate, Phos Binder, (PHOSLYRA) 667 MG/5ML SOLN Take 1,334 mg by mouth 3 (three) times daily with meals.   unknown at unknown   . carvedilol (COREG) 6.25 MG tablet Take 6.25 mg by mouth 2 (two) times daily with a meal.   unknown at unknown   . cinacalcet (SENSIPAR) 30 MG tablet Take 60 mg by mouth daily.   unknown at unknown   . hydrALAZINE (APRESOLINE) 25 MG tablet Take 25 mg by mouth 3 (three) times daily.    unknown at unknown   . losartan (COZAAR) 100 MG tablet Take 100 mg by mouth daily.   unknown at unknown   . omeprazole (PRILOSEC) 20 MG capsule Take 20 mg by mouth daily.   0 unknown at unknown  . oxyCODONE-acetaminophen (PERCOCET/ROXICET) 5-325 MG tablet Take 1 tablet by mouth every 4 (four) hours as needed for moderate pain. 30 tablet 0 PRN at PRN  . polyethylene glycol (MIRALAX / GLYCOLAX) packet Take 17 g by mouth daily as needed for mild constipation. 14 each 0 PRN at PRN  . pregabalin (LYRICA) 25 MG capsule Take 1 capsule (25 mg total) by mouth 2 (two) times daily. 60 capsule 5 unknown at unknown  . clopidogrel (PLAVIX) 75 MG tablet Take 1 tablet (75 mg total) by mouth daily. (Patient not taking: Reported on 07/15/2015) 30 tablet 5   . feeding supplement, ENSURE COMPLETE, (ENSURE COMPLETE) LIQD Take 237 mLs by mouth 3 (three) times daily with meals. (Patient not taking: Reported on 07/15/2015) 90 Bottle 6 unsure   . trolamine salicylate (ASPERCREME) 10 % cream Apply topically 2 (two) times daily as needed for muscle pain. (Patient not taking: Reported on 07/02/2015) 85 g 0 Completed Course at Unknown time   Home medication reconciliation was completed with the patient.   Scheduled Inpatient Medications:   . sodium chloride   Intravenous Once  . sodium chloride flush  3 mL Intravenous Q12H  . sodium chloride flush  3 mL Intravenous Q12H    Continuous Inpatient Infusions:     PRN Inpatient Medications:  sodium chloride, acetaminophen **OR** acetaminophen, bisacodyl, ondansetron **OR** ondansetron (ZOFRAN) IV, polyethylene glycol, sodium chloride flush  Family History: family history includes CAD in her father; Dementia in her mother.    Social History:   reports that she has quit smoking. Her smoking use included Cigarettes. She has a 7.5 pack-year smoking history. She has never used smokeless tobacco. She reports that she does not drink alcohol or use illicit drugs.  Review of Systems: Constitutional: Weight is stable.  Eyes: No changes in vision. ENT: No oral lesions, sore throat.  GI: see HPI.  Heme/Lymph: No easy bruising.  CV: No chest pain.  GU: No hematuria.  Integumentary: No rashes.  Neuro: No headaches.  Psych: No depression/anxiety.  Endocrine: No heat/cold intolerance.  Allergic/Immunologic: No urticaria.  Resp: No cough, SOB.  Musculoskeletal: No joint swelling.    Physical Examination: BP 125/43 mmHg  Pulse 53  Temp(Src) 97.8 F (36.6 C) (Oral)  Resp 14  Ht 5\' 6"  (1.676 m)  Wt 68.947 kg (152 lb)  BMI 24.55 kg/m2  SpO2 100%  LMP  (LMP Unknown) Gen: NAD, alert and oriented x 4 HEENT: PEERLA, EOMI, Neck: supple, no JVD or thyromegaly Chest: CTA bilaterally, no wheezes, crackles, or other adventitious sounds CV: RRR, no m/g/c/r Abd: soft, NT, ND, +BS in all four quadrants; no HSM, guarding, ridigity, or rebound tenderness Ext: no edema, well perfused with 2+  pulses, Skin: no rash or lesions noted Lymph: no LAD  Data: Lab Results  Component Value Date   WBC 7.4 07/15/2015   HGB 5.9* 07/15/2015   HCT 19.2* 07/15/2015   MCV 96.4 07/15/2015   PLT 296 07/15/2015    Recent Labs Lab 07/14/15 1145 07/15/15 1157  HGB 5.3* 5.9*   Lab Results  Component Value Date   NA 139 07/15/2015   K 4.0 07/15/2015   CL 102 07/15/2015   CO2 28 07/15/2015   BUN 35* 07/15/2015   CREATININE 4.67* 07/15/2015   Lab Results  Component Value Date   ALT 15 07/02/2015   AST 18 07/02/2015   ALKPHOS 245* 07/02/2015   BILITOT 0.8 07/02/2015    Recent Labs Lab 07/15/15 1157  APTT 35  INR 1.14   Assessment/Plan: Toni Carlson is a 70 y.o. female history of ESRD d/t mebranous glomerulonephritis, chronic diastolic HF, h/o CVA, pulmonary HTN, CAD, and Afib previously on warfarin admitted for symptomatic anemia.  Patient was told her Hgb had dropped at dialysis, and was 5.3 on arrival to the ED.  Patient also admits to occasional melena, with a black tarry stool in the ED.  FOBT returned positive.  Patient has been admitted multiple times for symptomatic anemia and melena.  EGD about 3 months ago showed a gastric ulcer.  Patient has not had a colonoscopy in man years, so recommend a colonoscopy given continued drops in Hgb despite recent transfusions and discontinuing warfarin.  Will also repeat EGD at this time to follow-up on gastric ulcer.  Further recs per Dr. Rayann Heman.   Recommendations: - Monitor Hgb, transfuse if <7 - Continue IV Protonix q 12 hrs - Plan for EGD & colonoscopy (likely Friday) - further recs per Dr. Rayann Heman  Thank you for the consult. We will follow along with you. Please call with questions or concerns.  Lavera Guise, PA-C [facility] Phone: [phone] Pager: [phone]

## 2015-07-15 NOTE — ED Notes (Signed)
Pt to ed with c/o abnormal labs.  Pt states she was told by nursing home that she needs a blood transfusion.  Pt alert and oriented, states she had dialysis yesterday.

## 2015-07-15 NOTE — Care Management Obs Status (Signed)
[place] NOTIFICATION   Patient Details  Name: Toni Carlson MRN: [id number] Date of Birth: [birth year]   Medicare Observation Status Notification Given:  Yes    Beau Fanny, RN 07/15/2015, 1:36 PM

## 2015-07-15 NOTE — H&P (Signed)
[facility] NAME: Toni Carlson    MR#:  [id number]  DATE OF BIRTH:  November 03, 1945  DATE OF ADMISSION:  07/15/2015  PRIMARY CARE PHYSICIAN: Murlean Iba, MD   REQUESTING/REFERRING PHYSICIAN: Dr. Jacqualine Code  CHIEF COMPLAINT:   Chief Complaint  Patient presents with  . abnormal labs     HISTORY OF PRESENT ILLNESS:  Toni Carlson  is a 70 y.o. female with a known history of incisional disease, anemia, CHF, hypertension presents to the emergency room sent in from rehabilitation after she was found to have hemoglobin low at 5.3. Platelet was repeated and it is 5.6. Patient has noticed on and off black tarry stools which she had an episode in the emergency room and stool Hemoccult was positive. Patient was recently in the hospital and transfuse 1 unit packed RBC. Her Coumadin was stopped due to worsening anemia. No endoscopy was done. Her last colonoscopy as per patient was many years back and was normal. Never had EGD. She is off Coumadin at this point. She does not have any abdominal pain. Minimal weight loss. No nausea vomiting or hematemesis.  PAST MEDICAL HISTORY:   Past Medical History  Diagnosis Date  . Hypertension   . ESRD (end stage renal disease) ([place])     On Tue-Thur-Sat dialysis  . Chronic diastolic CHF (congestive heart failure) ([place])     a. echo 03/2015: EF 60-65%, normal wall motion, diastolic parameters were c/w restrictive physiology, indicative of decreased LV diastolic compliance and/or increased LA pressure, mild AS, mild MR, left atrium was severely dilated, RA was severely dilated, PASP was severely increased at 90 mm Hg  . Anemia   . Hyperparathyroidism, secondary renal ([place])   . Coronary artery disease, non-occlusive     a. cardiac cath 2013  . H/O ischemic right MCA stroke     a. 03/2015; b. residual left-sided facial droop and slurred speech  . A-fib (HCC)     a. on warfarin  . Pulmonary HTN ([place])     as above   . Symptomatic bradycardia     a. history of symptomatic bradycardia; b. felt to be 2/2 metabolic abnormalities & required temp wire but no PPM, resolved with HD  . Carotid artery stenosis     a. ultrasound 03/2015: RICA 0000000 stenosis, LICA less than [id number] stenosis    PAST SURGICAL HISTORY:   Past Surgical History  Procedure Laterality Date  . Peripheral vascular catheterization N/A 02/23/2015    Procedure: A/V Shuntogram/Fistulagram;  Surgeon: Algernon Huxley, MD;  Location: [place] [facility];  Service: Cardiovascular;  Laterality: N/A;  . Peripheral vascular catheterization N/A 02/23/2015    Procedure: A/V Shunt Intervention;  Surgeon: Algernon Huxley, MD;  Location: [place] [facility];  Service: Cardiovascular;  Laterality: N/A;  . Esophagogastroduodenoscopy (egd) with propofol N/A 04/14/2015    Procedure: ESOPHAGOGASTRODUODENOSCOPY (EGD) WITH PROPOFOL;  Surgeon: Lucilla Lame, MD;  Location: [facility] ENDOSCOPY;  Service: Endoscopy;  Laterality: N/A;    SOCIAL HISTORY:   Social History  Substance Use Topics  . Smoking status: Former Smoker -- 0.25 packs/day for 30 years    Types: Cigarettes  . Smokeless tobacco: Never Used  . Alcohol Use: No    FAMILY HISTORY:   Family History  Problem Relation Age of Onset  . CAD Father   . Dementia Mother     DRUG ALLERGIES:  No Known Allergies  REVIEW OF SYSTEMS:   Review of Systems  Constitutional: Positive for malaise/fatigue. Negative for fever, chills and weight loss.  HENT: Negative for hearing loss and nosebleeds.   Eyes: Negative for blurred vision, double vision and pain.  Respiratory: Negative for cough, hemoptysis, sputum production, shortness of breath and wheezing.   Cardiovascular: Negative for chest pain, palpitations, orthopnea and leg swelling.  Gastrointestinal: Negative for nausea, vomiting, abdominal pain, diarrhea and constipation.  Genitourinary: Negative for dysuria and hematuria.  Musculoskeletal: Positive for  back pain and joint pain. Negative for myalgias and falls.  Skin: Negative for rash.  Neurological: Positive for weakness. Negative for dizziness, tremors, sensory change, speech change, focal weakness, seizures and headaches.  Endo/Heme/Allergies: Does not bruise/bleed easily.  Psychiatric/Behavioral: Negative for depression and memory loss. The patient is not nervous/anxious.     MEDICATIONS AT HOME:   Prior to Admission medications   Medication Sig Start Date End Date Taking? Authorizing Provider  amLODipine (NORVASC) 10 MG tablet Take 10 mg by mouth daily.   Yes Historical Provider, MD  atorvastatin (LIPITOR) 40 MG tablet Take 40 mg by mouth daily.   Yes Historical Provider, MD  calcium acetate, Phos Binder, (PHOSLYRA) 667 MG/5ML SOLN Take 1,334 mg by mouth 3 (three) times daily with meals.   Yes Historical Provider, MD  carvedilol (COREG) 6.25 MG tablet Take 6.25 mg by mouth 2 (two) times daily with a meal.   Yes Historical Provider, MD  cinacalcet (SENSIPAR) 30 MG tablet Take 60 mg by mouth daily.   Yes Historical Provider, MD  hydrALAZINE (APRESOLINE) 25 MG tablet Take 25 mg by mouth 3 (three) times daily.    Yes Historical Provider, MD  losartan (COZAAR) 100 MG tablet Take 100 mg by mouth daily.   Yes Historical Provider, MD  omeprazole (PRILOSEC) 20 MG capsule Take 20 mg by mouth daily.    Yes Historical Provider, MD  oxyCODONE-acetaminophen (PERCOCET/ROXICET) 5-325 MG tablet Take 1 tablet by mouth every 4 (four) hours as needed for moderate pain. 07/08/15  Yes Theodoro Grist, MD  polyethylene glycol (MIRALAX / GLYCOLAX) packet Take 17 g by mouth daily as needed for mild constipation. 04/03/15  Yes Max Sane, MD  pregabalin (LYRICA) 25 MG capsule Take 1 capsule (25 mg total) by mouth 2 (two) times daily. 07/08/15  Yes Theodoro Grist, MD  clopidogrel (PLAVIX) 75 MG tablet Take 1 tablet (75 mg total) by mouth daily. Patient not taking: Reported on 07/15/2015 07/08/15   Theodoro Grist, MD   feeding supplement, ENSURE COMPLETE, (ENSURE COMPLETE) LIQD Take 237 mLs by mouth 3 (three) times daily with meals. Patient not taking: Reported on 07/15/2015 04/03/15   Theodoro Grist, MD  trolamine salicylate (ASPERCREME) 10 % cream Apply topically 2 (two) times daily as needed for muscle pain. Patient not taking: Reported on 07/02/2015 04/03/15   Theodoro Grist, MD     VITAL SIGNS:  Blood pressure 125/43, pulse 53, temperature 97.8 F (36.6 C), temperature source Oral, resp. rate 14, height 5\' 6"  (1.676 m), weight 68.947 kg (152 lb), SpO2 100 %.  PHYSICAL EXAMINATION:  Physical Exam  GENERAL:  70 y.o.-year-old patient lying in the bed with no acute distress. Pale EYES: Pupils equal, round, reactive to light and accommodation. No scleral icterus. Extraocular muscles intact.  HEENT: Head atraumatic, normocephalic. Oropharynx and nasopharynx clear. No oropharyngeal erythema, moist oral mucosa  NECK:  Supple, no jugular venous distention. No thyroid enlargement, no tenderness.  LUNGS: Normal breath sounds bilaterally, no wheezing, rales, rhonchi. No use of accessory muscles of respiration.  CARDIOVASCULAR: S1,  S2 normal. No murmurs, rubs, or gallops.  ABDOMEN: Soft, nontender, nondistended. Bowel sounds present. No organomegaly or mass.  EXTREMITIES: No pedal edema, cyanosis, or clubbing. + 2 pedal & radial pulses b/l.   NEUROLOGIC: Cranial nerves II through XII are intact. His motor strength of left lower extremity which is chronic PSYCHIATRIC: The patient is alert and oriented x 3. Good affect.  SKIN: No obvious rash, lesion, or ulcer.   LABORATORY PANEL:   CBC  Recent Labs Lab 07/15/15 1157  WBC 7.4  HGB 5.9*  HCT 19.2*  PLT 296   ------------------------------------------------------------------------------------------------------------------  Chemistries   Recent Labs Lab 07/15/15 1157  NA 139  K 4.0  CL 102  CO2 28  GLUCOSE 91  BUN 35*  CREATININE 4.67*  CALCIUM  7.4*   ------------------------------------------------------------------------------------------------------------------  Cardiac Enzymes No results for input(s): TROPONINI in the last 168 hours. ------------------------------------------------------------------------------------------------------------------  RADIOLOGY:  No results found.   IMPRESSION AND PLAN:   * Acute blood loss anemia over anemia of chronic disease due to GI bleed Patient had black tarry stools in the emergency room with heme positive. Will transfuse 2 units packed RBC and admit patient. Consult GI for EGD. Patient was on Coumadin until recently which was stopped during last admission due to dropping hemoglobin. In spite of it patient has worsening hemoglobin.  * End-stage renal disease on hemodialysis Consult nephrology  * Hypertension Continue home medication  * Chronic back and lower extremity pain is the same. Patient is presently in rehabilitation.  * DVT prophylaxis with SCDs  All the records are reviewed and case discussed with ED provider. Management plans discussed with the patient, family and they are in agreement.  CODE STATUS: FULL  TOTAL TIME TAKING CARE OF THIS PATIENT: 40 minutes.   Hillary Bow R M.D on 07/15/2015 at 2:33 PM  Between 7am to 6pm - Pager - [phone]  After 6pm go to [email or web address] - password [email or web address] [facility]  Office  [phone]  CC: Primary care physician; Murlean Iba, MD  Note: This dictation was prepared with Dragon dictation along with smaller phrase technology. Any transcriptional errors that result from this process are unintentional.

## 2015-07-16 LAB — TYPE AND SCREEN
ABO/RH(D): A POS
ANTIBODY SCREEN: NEGATIVE
Unit division: 0
Unit division: 0

## 2015-07-16 LAB — CBC
HEMATOCRIT: 22.8 % — AB (ref 35.0–47.0)
HEMOGLOBIN: 7.7 g/dL — AB (ref 12.0–16.0)
MCH: 30.3 pg (ref 26.0–34.0)
MCHC: 33.5 g/dL (ref 32.0–36.0)
MCV: 90.5 fL (ref 80.0–100.0)
Platelets: 268 10*3/uL (ref 150–440)
RBC: 2.52 MIL/uL — AB (ref 3.80–5.20)
RDW: 18 % — ABNORMAL HIGH (ref 11.5–14.5)
WBC: 7.4 10*3/uL (ref 3.6–11.0)

## 2015-07-16 LAB — BASIC METABOLIC PANEL
ANION GAP: 12 (ref 5–15)
BUN: 41 mg/dL — ABNORMAL HIGH (ref 6–20)
CALCIUM: 7.5 mg/dL — AB (ref 8.9–10.3)
CO2: 25 mmol/L (ref 22–32)
Chloride: 103 mmol/L (ref 101–111)
Creatinine, Ser: 5.56 mg/dL — ABNORMAL HIGH (ref 0.44–1.00)
GFR, EST AFRICAN AMERICAN: 8 mL/min — AB (ref >60)
GFR, EST NON AFRICAN AMERICAN: 7 mL/min — AB (ref >60)
Glucose, Bld: 81 mg/dL (ref 65–99)
POTASSIUM: 4.3 mmol/L (ref 3.5–5.1)
Sodium: 140 mmol/L (ref 135–145)

## 2015-07-16 MED ORDER — IRON SUCROSE 20 MG/ML IV SOLN
200.0000 mg | Freq: Once | INTRAVENOUS | Status: AC
  Administered 2015-07-16: 200 mg via INTRAVENOUS
  Filled 2015-07-16: qty 10

## 2015-07-16 MED ORDER — LOSARTAN POTASSIUM 50 MG PO TABS
100.0000 mg | ORAL_TABLET | Freq: Every day | ORAL | Status: DC
  Administered 2015-07-17: 100 mg via ORAL
  Filled 2015-07-16: qty 2

## 2015-07-16 NOTE — Progress Notes (Signed)
Hemodialysis tx start 

## 2015-07-16 NOTE — Progress Notes (Signed)
[facility] [place] NAME: Toni Carlson    MR#:  [id number]  DATE OF BIRTH:  1945-07-11  SUBJECTIVE:   Patient here due to acute blood loss anemia noted to have a hemoglobin of 5.6. Hemoglobin improved posttransfusion. Seen by gastroenterology and going for endoscopy on Friday. No acute complaints  REVIEW OF SYSTEMS:    Review of Systems  Constitutional: Negative for fever and chills.  HENT: Negative for congestion and tinnitus.   Eyes: Negative for blurred vision and double vision.  Respiratory: Negative for cough, shortness of breath and wheezing.   Cardiovascular: Negative for chest pain, orthopnea and PND.  Gastrointestinal: Negative for nausea, vomiting, abdominal pain, diarrhea and blood in stool.  Genitourinary: Negative for dysuria and hematuria.  Neurological: Positive for weakness. Negative for dizziness, sensory change and focal weakness.  All other systems reviewed and are negative.   Nutrition: Clear liquid Tolerating Diet:  Yes Tolerating PT: Eval Noted.      DRUG ALLERGIES:  No Known Allergies  VITALS:  Blood pressure 146/46, pulse 52, temperature 98.5 F (36.9 C), temperature source Oral, resp. rate 18, height 5\' 6"  (1.676 m), weight 71.532 kg (157 lb 11.2 oz), SpO2 100 %.  PHYSICAL EXAMINATION:   Physical Exam  GENERAL:  70 y.o.-year-old patient lying in the bed with no acute distress.  EYES: Pupils equal, round, reactive to light and accommodation. No scleral icterus. Extraocular muscles intact.  HEENT: Head atraumatic, normocephalic. Oropharynx and nasopharynx clear.  NECK:  Supple, no jugular venous distention. No thyroid enlargement, no tenderness.  LUNGS: Normal breath sounds bilaterally, no wheezing, rales, rhonchi. No use of accessory muscles of respiration.  CARDIOVASCULAR: S1, S2 normal. No murmurs, rubs, or gallops.  ABDOMEN: Soft, nontender, nondistended. Bowel sounds present. No organomegaly  or mass.  EXTREMITIES: No cyanosis, clubbing or edema b/l.    NEUROLOGIC: Cranial nerves II through XII are intact. No focal Motor or sensory deficits b/l.   PSYCHIATRIC: The patient is alert and oriented x 3.  SKIN: No obvious rash, lesion, or ulcer.   Left upper extremity AV fistula with good bruit and thrill.  LABORATORY PANEL:   CBC  Recent Labs Lab 07/16/15 0523  WBC 7.4  HGB 7.7*  HCT 22.8*  PLT 268   ------------------------------------------------------------------------------------------------------------------  Chemistries   Recent Labs Lab 07/16/15 0523  NA 140  K 4.3  CL 103  CO2 25  GLUCOSE 81  BUN 41*  CREATININE 5.56*  CALCIUM 7.5*   ------------------------------------------------------------------------------------------------------------------  Cardiac Enzymes No results for input(s): TROPONINI in the last 168 hours. ------------------------------------------------------------------------------------------------------------------  RADIOLOGY:  No results found.   ASSESSMENT AND PLAN:   70 year old female with past medical history of end-stage renal disease on hemodialysis, chronic anemia, hypertension, history of diastolic CHF, secondary hyperparathyroidism, history of A. fib, pulmonary hypertension who presented to the hospital due to anemia.  #1 acute on Chronic anemia - acute blood loss anemia, this is due to a GI bleed. -Patient has been transfused 2 units of packed red blood cells and hemoglobin is improved. -Seen by gastroenterology and going for endoscopy tomorrow. -Continue Protonix.  #2 GI bleed-this is likely an upper GI bleed. -Hemoglobin improved posttransfusion. Appreciate gastroenterology input and going for endoscopy tomorrow. -Continue Protonix and clear liquid diet.  #3 end-stage renal disease and hemodialysis-nephrology has been consult. We'll continue dialysis on Tuesday Thursday Saturday.  #4 secondary  hyperparathyroidism-continue calcium acetate.  #5 hypertension-continue losartan, hydralazine, Norvasc, Coreg.  #6 hyperlipidemia-continue atorvastatin.  All the records are reviewed and case discussed with Care Management/Social Workerr. Management plans discussed with the patient, family and they are in agreement.  CODE STATUS: Full  DVT Prophylaxis: TEd's and SCD's  TOTAL TIME TAKING CARE OF THIS PATIENT: 30 minutes.   POSSIBLE D/C IN 1-2 DAYS, DEPENDING ON CLINICAL CONDITION.   Henreitta Leber M.D on 07/16/2015 at 5:28 PM  Between 7am to 6pm - Pager - [phone]  After 6pm go to [email or web address] - password [email or web address] [facility]  Office  [phone]  CC: Primary care physician; Murlean Iba, MD

## 2015-07-16 NOTE — Clinical Social Work Note (Signed)
Clinical Social Work Assessment  Patient Details  Name: Toni Carlson MRN: [id number] Date of Birth: 06-02-1945  Date of referral:  07/16/15               Reason for consult:  Facility Placement                Permission sought to share information with:  Facility Art therapist granted to share information::  Yes, Verbal Permission Granted  Name::        Agency::     Relationship::     Contact Information:     Housing/Transportation Living arrangements for the past 2 months:  [place] of Information:  Patient Patient Interpreter Needed:  None Criminal Activity/Legal Involvement Pertinent to Current Situation/Hospitalization:  No - Comment as needed Significant Relationships:  Adult Children Lives with:  Adult Children, Facility Resident Do you feel safe going back to the place where you live?  Yes Need for family participation in patient care:  Yes (Comment)  Care giving concerns:  Patient has been at [facility] for short term rehab for about one month.   Social Worker assessment / plan:  Patient informed CSW that her daughter lives with her at her home. Patient states she has been at [facility] in order to get stronger so she could return home. Patient states she wishes to return to [facility] at discharge and states that they treat her well but it isn't home. Patient very pleasant to speak with and engaged in conversation. Helene Kelp at [facility] states patient can return at discharge.   Employment status:  Disabled (Comment on whether or not currently receiving Disability) Insurance information:  Medicare PT Recommendations:  Not assessed at this time Information / Referral to community resources:     Patient/Family's Response to care:  Patient expressed appreciation for CSW assistance.  Patient/Family's Understanding of and Emotional Response to Diagnosis, Current Treatment, and Prognosis:  Patient motivated to continue her rehab at [facility].    Emotional Assessment Appearance:  Appears stated age Attitude/Demeanor/Rapport:   (pleasant and cooperative) Affect (typically observed):  Accepting, Adaptable, Calm, Happy Orientation:  Oriented to Self, Oriented to Place, Oriented to  Time, Oriented to Situation Alcohol / Substance use:  Not Applicable Psych involvement (Current and /or in the community):  No (Comment)  Discharge Needs  Concerns to be addressed:  Care Coordination Readmission within the last 30 days:  Yes Current discharge risk:  None Barriers to Discharge:  No Barriers Identified   Shela Leff, LCSW 07/16/2015, 3:48 PM

## 2015-07-16 NOTE — Progress Notes (Signed)
Pre-hd tx 

## 2015-07-16 NOTE — NC FL2 (Signed)
[place] LEVEL OF CARE SCREENING TOOL     IDENTIFICATION  Patient Name: Toni Carlson Birthdate: 1946-01-11 Sex: female Admission Date (Current Location): 07/15/2015  [place] and [place] Number:  Engineering geologist and Address:  [employer], [address] [place], [place] [address]      Provider Number: [id number]  Attending Physician Name and Address:  Henreitta Leber, MD  Relative Name and Phone Number:       Current Level of Care: Hospital Recommended Level of Care: [place] Prior Approval Number:    Date Approved/Denied:   PASRR Number:    Discharge Plan: SNF    Current Diagnoses: Patient Active Problem List   Diagnosis Date Noted  . Anemia 07/15/2015  . GI bleed 07/15/2015  . Polyneuropathy ([place]) 07/08/2015  . Degenerative disc disease, lumbar 07/08/2015  . Pancytopenia ([place]) 07/08/2015  . Generalized weakness 07/08/2015  . End stage renal disease ([place]) 07/08/2015  . Atherosclerotic peripheral vascular disease ([place]) 07/08/2015  . Pain in both feet 07/02/2015  . Gastric ulceration   . Acute GI bleeding   . GIB (gastrointestinal bleeding) 04/12/2015  . Left-sided weakness 04/01/2015  . Essential hypertension, malignant 04/01/2015  . Anemia of chronic disease 04/01/2015  . Elevated troponin 04/01/2015  . Pulmonary hypertension ([place]) 04/01/2015  . Atrial fibrillation ([place])   . CVA (cerebral infarction) 03/26/2015    Orientation RESPIRATION BLADDER Height & Weight     Self, Time, Situation, Place  Normal Continent Weight: 157 lb 11.2 oz (71.532 kg) Height:  5\' 6"  (167.6 cm)  BEHAVIORAL SYMPTOMS/MOOD NEUROLOGICAL BOWEL NUTRITION STATUS   (none)  (none) Continent    AMBULATORY STATUS COMMUNICATION OF NEEDS Skin   Limited Assist Verbally Normal                       Personal Care Assistance Level of Assistance  Bathing, Dressing Bathing Assistance: Limited assistance Feeding assistance:  Limited assistance Dressing Assistance: Limited assistance     Functional Limitations Info  Speech     Speech Info: Impaired    SPECIAL CARE FACTORS FREQUENCY                       Contractures Contractures Info: Not present    Additional Factors Info                  Current Medications (07/16/2015):  This is the current hospital active medication list Current Facility-Administered Medications  Medication Dose Route Frequency Provider Last Rate Last Dose  . 0.9 %  sodium chloride infusion  250 mL Intravenous PRN Hillary Bow, MD      . acetaminophen (TYLENOL) tablet 650 mg  650 mg Oral Q6H PRN Hillary Bow, MD       Or  . acetaminophen (TYLENOL) suppository 650 mg  650 mg Rectal Q6H PRN Srikar Sudini, MD      . amLODipine (NORVASC) tablet 10 mg  10 mg Oral Daily Hillary Bow, MD   Stopped at 07/16/15 1018  . atorvastatin (LIPITOR) tablet 40 mg  40 mg Oral Daily Hillary Bow, MD   40 mg at 07/16/15 1036  . bisacodyl (DULCOLAX) EC tablet 5 mg  5 mg Oral Daily PRN Srikar Sudini, MD      . calcium acetate (Phos Binder) (PHOSLYRA) 667 MG/5ML oral solution 1,334 mg  1,334 mg Oral TID WC Hillary Bow, MD   1,334 mg at 07/15/15 1700  .  carvedilol (COREG) tablet 6.25 mg  6.25 mg Oral BID WC Hillary Bow, MD   Stopped at 07/16/15 1017  . cinacalcet (SENSIPAR) tablet 60 mg  60 mg Oral Daily Hillary Bow, MD   60 mg at 07/15/15 1828  . hydrALAZINE (APRESOLINE) tablet 25 mg  25 mg Oral TID Hillary Bow, MD   Stopped at 07/16/15 1019  . losartan (COZAAR) tablet 100 mg  100 mg Oral Daily Henreitta Leber, MD      . ondansetron [facility]) tablet 4 mg  4 mg Oral Q6H PRN Hillary Bow, MD       Or  . ondansetron (ZOFRAN) injection 4 mg  4 mg Intravenous Q6H PRN Srikar Sudini, MD      . oxyCODONE-acetaminophen (PERCOCET/ROXICET) 5-325 MG per tablet 1 tablet  1 tablet Oral Q4H PRN Srikar Sudini, MD      . pantoprazole (PROTONIX) injection 40 mg  40 mg Intravenous Q12H Srikar  Sudini, MD   40 mg at 07/16/15 1036  . polyethylene glycol (MIRALAX / GLYCOLAX) packet 17 g  17 g Oral Daily PRN Srikar Sudini, MD      . pregabalin (LYRICA) capsule 25 mg  25 mg Oral BID Hillary Bow, MD   25 mg at 07/16/15 1036  . sodium chloride flush (NS) 0.9 % injection 3 mL  3 mL Intravenous Q12H Srikar Sudini, MD   3 mL at 07/15/15 1315  . sodium chloride flush (NS) 0.9 % injection 3 mL  3 mL Intravenous Q12H Srikar Sudini, MD   3 mL at 07/15/15 1315  . sodium chloride flush (NS) 0.9 % injection 3 mL  3 mL Intravenous PRN Hillary Bow, MD   3 mL at 07/16/15 1037     Discharge Medications: Please see discharge summary for a list of discharge medications.  Relevant Imaging Results:  Relevant Lab Results:   Additional Information    Shela Leff, LCSW

## 2015-07-16 NOTE — Progress Notes (Signed)
Post hd tx 

## 2015-07-16 NOTE — Progress Notes (Signed)
Subjective:  Patient admitted for low Hgb Legs and feet are doing better guiac + stools Scheduled for endoscopy tomorrow   HEMODIALYSIS FLOWSHEET:  Blood Flow Rate (mL/min): 400 mL/min Arterial Pressure (mmHg): -100 mmHg Venous Pressure (mmHg): 150 mmHg Transmembrane Pressure (mmHg): 60 mmHg Ultrafiltration Rate (mL/min): 500 mL/min Dialysate Flow Rate (mL/min): 800 ml/min Conductivity: Machine : 14.1 Conductivity: Machine : 14.1 Dialysis Fluid Bolus: Normal Saline Bolus Amount (mL): 250 mL Intra-Hemodialysis Comments: 262ml....Dr.Previn Jian bedside rounding.     Objective:  Vital signs in last 24 hours:  Temp:  [97.1 F (36.2 C)-98.7 F (37.1 C)] 97.1 F (36.2 C) (03/02 1740) Pulse Rate:  [49-72] 51 (03/02 1745) Resp:  [16-20] 18 (03/02 1745) BP: (140-169)/(39-71) 161/59 mmHg (03/02 1745) SpO2:  [99 %-100 %] 100 % (03/02 1447) Weight:  [71.532 kg (157 lb 11.2 oz)] 71.532 kg (157 lb 11.2 oz) (03/02 1740)  Weight change:  Filed Weights   07/15/15 1131 07/16/15 0629 07/16/15 1740  Weight: 68.947 kg (152 lb) 71.532 kg (157 lb 11.2 oz) 71.532 kg (157 lb 11.2 oz)    Intake/Output:    Intake/Output Summary (Last 24 hours) at 07/16/15 1818 Last data filed at 07/15/15 2345  Gross per 24 hour  Intake   1026 ml  Output      0 ml  Net   1026 ml     Physical Exam: General: NAD  HEENT anicteric  Neck supple  Pulm/lungs clear  CVS/Heart No rub  Abdomen:  Soft, NT  Extremities: Trace edema  Neurologic: Alert, oreinted  Skin: No acute rashes  Access: AVF       Basic Metabolic Panel:   Recent Labs Lab 07/15/15 1157 07/16/15 0523  NA 139 140  K 4.0 4.3  CL 102 103  CO2 28 25  GLUCOSE 91 81  BUN 35* 41*  CREATININE 4.67* 5.56*  CALCIUM 7.4* 7.5*     CBC:  Recent Labs Lab 07/14/15 1145 07/15/15 1157 07/16/15 0523  WBC  --  7.4 7.4  HGB 5.3* 5.9* 7.7*  HCT  --  19.2* 22.8*  MCV  --  96.4 90.5  PLT  --  296 268      Microbiology:  Recent  Results (from the past 720 hour(s))  MRSA PCR Screening     Status: None   Collection Time: 07/02/15  5:50 PM  Result Value Ref Range Status   MRSA by PCR NEGATIVE NEGATIVE Final    Comment:        The GeneXpert MRSA Assay (FDA approved for NASAL specimens only), is one component of a comprehensive MRSA colonization surveillance program. It is not intended to diagnose MRSA infection nor to guide or monitor treatment for MRSA infections.   MRSA PCR Screening     Status: None   Collection Time: 07/15/15  4:20 PM  Result Value Ref Range Status   MRSA by PCR NEGATIVE NEGATIVE Final    Comment:        The GeneXpert MRSA Assay (FDA approved for NASAL specimens only), is one component of a comprehensive MRSA colonization surveillance program. It is not intended to diagnose MRSA infection nor to guide or monitor treatment for MRSA infections.     Coagulation Studies:  Recent Labs  07/15/15 1157  LABPROT 14.8  INR 1.14    Urinalysis: No results for input(s): COLORURINE, LABSPEC, PHURINE, GLUCOSEU, HGBUR, BILIRUBINUR, KETONESUR, PROTEINUR, UROBILINOGEN, NITRITE, LEUKOCYTESUR in the last 72 hours.  Invalid input(s): APPERANCEUR    Imaging: No results found.  Medications:     . amLODipine  10 mg Oral Daily  . atorvastatin  40 mg Oral Daily  . calcium acetate (Phos Binder)  1,334 mg Oral TID WC  . carvedilol  6.25 mg Oral BID WC  . cinacalcet  60 mg Oral Daily  . hydrALAZINE  25 mg Oral TID  . losartan  100 mg Oral Daily  . pantoprazole (PROTONIX) IV  40 mg Intravenous Q12H  . pregabalin  25 mg Oral BID  . sodium chloride flush  3 mL Intravenous Q12H  . sodium chloride flush  3 mL Intravenous Q12H   sodium chloride, acetaminophen **OR** acetaminophen, bisacodyl, ondansetron **OR** ondansetron (ZOFRAN) IV, oxyCODONE-acetaminophen, polyethylene glycol, sodium chloride flush  Assessment/ Plan:  70 y.o. female with end stage renal disease on hemodialysis, GERD,  anemia, atrial fibrillation, CVA, coronary artery disease, pulmonary hypertension, carotid artery stenosis   TTS [facility] [address] [address]   1. End Stage Renal Disease:  - Continue TTS schedule.  - Patient seen during dialysis Tolerating well   2. Anemia of CKD and GI bleed; and iron deficiency - endoscopy planned  - venofer 200 mg iv    3. Secondary Hyperparathyroidism:  - phoslyra and sensipar with meals     LOS: 1 Destenie Ingber 3/2/20176:18 PM

## 2015-07-16 NOTE — Progress Notes (Signed)
Hemodialysis completed. 

## 2015-07-17 ENCOUNTER — Encounter
Admission: EM | Disposition: A | Discharge status: Skilled Nursing Facility | Payer: Self-pay | Source: Home / Self Care | Attending: Specialist

## 2015-07-17 ENCOUNTER — Inpatient Hospital Stay: Payer: Medicare Other | Admitting: Anesthesiology

## 2015-07-17 ENCOUNTER — Encounter: Payer: Self-pay | Admitting: Anesthesiology

## 2015-07-17 HISTORY — PX: ESOPHAGOGASTRODUODENOSCOPY (EGD) WITH PROPOFOL: SHX5813

## 2015-07-17 LAB — CBC
HEMATOCRIT: 23 % — AB (ref 35.0–47.0)
Hemoglobin: 7.6 g/dL — ABNORMAL LOW (ref 12.0–16.0)
MCH: 30.7 pg (ref 26.0–34.0)
MCHC: 33.1 g/dL (ref 32.0–36.0)
MCV: 92.7 fL (ref 80.0–100.0)
PLATELETS: 237 10*3/uL (ref 150–440)
RBC: 2.48 MIL/uL — ABNORMAL LOW (ref 3.80–5.20)
RDW: 18.4 % — AB (ref 11.5–14.5)
WBC: 7.8 10*3/uL (ref 3.6–11.0)

## 2015-07-17 SURGERY — ESOPHAGOGASTRODUODENOSCOPY (EGD) WITH PROPOFOL
Anesthesia: General

## 2015-07-17 MED ORDER — PROPOFOL 500 MG/50ML IV EMUL
INTRAVENOUS | Status: DC | PRN
  Administered 2015-07-17: 10 ug/kg/min via INTRAVENOUS

## 2015-07-17 MED ORDER — HYDRALAZINE HCL 20 MG/ML IJ SOLN: 10.0000 mg | Freq: Four times a day (QID) | INTRAMUSCULAR | Status: DC | PRN

## 2015-07-17 MED ORDER — PANTOPRAZOLE SODIUM 40 MG PO TBEC: 40.0000 mg | DELAYED_RELEASE_TABLET | Freq: Two times a day (BID) | ORAL | Status: DC

## 2015-07-17 MED ORDER — SODIUM CHLORIDE 0.9 % IV SOLN
INTRAVENOUS | Status: DC
  Administered 2015-07-17: 17:00:00 via INTRAVENOUS

## 2015-07-17 MED ORDER — HYDRALAZINE HCL 25 MG PO TABS
25.0000 mg | ORAL_TABLET | Freq: Once | ORAL | Status: AC
  Administered 2015-07-17: 25 mg via ORAL
  Filled 2015-07-17: qty 1

## 2015-07-17 MED ORDER — PROPOFOL 10 MG/ML IV BOLUS
INTRAVENOUS | Status: DC | PRN
  Administered 2015-07-17: 50 mg via INTRAVENOUS

## 2015-07-17 MED ORDER — FENTANYL CITRATE (PF) 100 MCG/2ML IJ SOLN
INTRAMUSCULAR | Status: DC | PRN
  Administered 2015-07-17: 50 ug via INTRAVENOUS

## 2015-07-17 MED ORDER — SODIUM CHLORIDE 0.9 % IV SOLN
INTRAVENOUS | Status: DC
  Administered 2015-07-17: 1000 mL via INTRAVENOUS

## 2015-07-17 MED ORDER — OXYCODONE-ACETAMINOPHEN 5-325 MG PO TABS: 1.0000 | ORAL_TABLET | ORAL | Status: DC | PRN

## 2015-07-17 MED ORDER — LIDOCAINE HCL (CARDIAC) 20 MG/ML IV SOLN
INTRAVENOUS | Status: DC | PRN
  Administered 2015-07-17: 40 mg via INTRAVENOUS

## 2015-07-17 NOTE — Progress Notes (Signed)
Pt A and O x 4. VSS. Pt tolerating diet well. No complaints of pain or nausea. IV removed intact. No signs of active GI bleed. Report called to [facility] at [facility].Pt left Via EMS.

## 2015-07-17 NOTE — Clinical Social Work Note (Signed)
Patient to discharge today to [facility]. CSW has sent discharge information. Patient requests transport via EMS. Helene Kelp at [facility] is aware of discharge. Patient is alert and oriented X4.  Shela Leff MSW,LCSW [phone]

## 2015-07-17 NOTE — H&P (View-Only) (Signed)
GI Inpatient Consult Note  Reason for Consult: Anemia, Heme + stool   Attending Requesting Consult: Sudini  History of Present Illness: Toni Carlson is a 70 y.o. female with a history of ESRD d/t mebranous glomerulonephritis, chronic diastolic HF, h/o CVA, pulmonary HTN, CAD, and Afib previously on warfarin admitted for symptomatic anemia.  Patient states she was in her usual state of health at dialysis when she learned her Hgb was low after routine labs were drawn.  She was encouraged to go to the ED for further evaluation, as she has as a h/o of symptomatic anemia requiring blood transfusions.  Upon further questioning, patient admits to some mildly increased SOB over the last week or so.  Patient also endorses some baseline weakness and fatigue, that has not worsened recently.  She denies CP, lightheadedness, or dizziness.  Patient also notes her stools can be "black sometimes, normal other times".  In the ED, patient had a BM that was black and tarry; FOBT returned heme +.  Review of records in Epic show patient underwent an EGD to evaluate melena in 03/2015; this revealed a non-bleeding gastric ulcer.  Patient states she continues to take Prilosec 20mg  daily and avoid NSAIDs.  Her last colonoscopy was "many years ago".  She denies current epigastric pain, nausea, vomiting, or hematemesis.  Of note, patient's warfarin was d/c during last admission d/t recurrent drops in Hgb.  Upon arrival to the ED, labs revealed Hgb 5.3 (5.9 on full CBC), Hct 19.2, MCV 96.4.  Two units PRBCs have been ordered.  GI consultation was ordered for further management.  Past Medical History:  Past Medical History  Diagnosis Date  . Hypertension   . ESRD (end stage renal disease) ([place])     On Tue-Thur-Sat dialysis  . Chronic diastolic CHF (congestive heart failure) ([place])     a. echo 03/2015: EF 60-65%, normal wall motion, diastolic parameters were c/w restrictive physiology, indicative of decreased LV diastolic  compliance and/or increased LA pressure, mild AS, mild MR, left atrium was severely dilated, RA was severely dilated, PASP was severely increased at 90 mm Hg  . Anemia   . Hyperparathyroidism, secondary renal ([place])   . Coronary artery disease, non-occlusive     a. cardiac cath 2013  . H/O ischemic right MCA stroke     a. 03/2015; b. residual left-sided facial droop and slurred speech  . A-fib ([facility])     a. on warfarin  . Pulmonary HTN ([place])     as above  . Symptomatic bradycardia     a. history of symptomatic bradycardia; b. felt to be 2/2 metabolic abnormalities & required temp wire but no PPM, resolved with HD  . Carotid artery stenosis     a. ultrasound 03/2015: RICA 0000000 stenosis, LICA less than [id number] stenosis    Problem List: Patient Active Problem List   Diagnosis Date Noted  . Anemia 07/15/2015  . GI bleed 07/15/2015  . Polyneuropathy ([place]) 07/08/2015  . Degenerative disc disease, lumbar 07/08/2015  . Pancytopenia ([place]) 07/08/2015  . Generalized weakness 07/08/2015  . End stage renal disease ([place]) 07/08/2015  . Atherosclerotic peripheral vascular disease ([place]) 07/08/2015  . Pain in both feet 07/02/2015  . Gastric ulceration   . Acute GI bleeding   . GIB (gastrointestinal bleeding) 04/12/2015  . Left-sided weakness 04/01/2015  . Essential hypertension, malignant 04/01/2015  . Anemia of chronic disease 04/01/2015  . Elevated troponin 04/01/2015  . Pulmonary hypertension ([place]) 04/01/2015  . Atrial fibrillation ([place])   .  CVA (cerebral infarction) 03/26/2015    Past Surgical History: Past Surgical History  Procedure Laterality Date  . Peripheral vascular catheterization N/A 02/23/2015    Procedure: A/V Shuntogram/Fistulagram;  Surgeon: Algernon Huxley, MD;  Location: [place] CV [facility];  Service: Cardiovascular;  Laterality: N/A;  . Peripheral vascular catheterization N/A 02/23/2015    Procedure: A/V Shunt Intervention;  Surgeon: Algernon Huxley, MD;  Location: [place]  CV LAB;  Service: Cardiovascular;  Laterality: N/A;  . Esophagogastroduodenoscopy (egd) with propofol N/A 04/14/2015    Procedure: ESOPHAGOGASTRODUODENOSCOPY (EGD) WITH PROPOFOL;  Surgeon: Lucilla Lame, MD;  Location: [facility] ENDOSCOPY;  Service: Endoscopy;  Laterality: N/A;    Allergies: No Known Allergies  Home Medications: Prescriptions prior to admission  Medication Sig Dispense Refill Last Dose  . amLODipine (NORVASC) 10 MG tablet Take 10 mg by mouth daily.   unknown at unknown  . atorvastatin (LIPITOR) 40 MG tablet Take 40 mg by mouth daily.   unknown at unknown  . calcium acetate, Phos Binder, (PHOSLYRA) 667 MG/5ML SOLN Take 1,334 mg by mouth 3 (three) times daily with meals.   unknown at unknown   . carvedilol (COREG) 6.25 MG tablet Take 6.25 mg by mouth 2 (two) times daily with a meal.   unknown at unknown   . cinacalcet (SENSIPAR) 30 MG tablet Take 60 mg by mouth daily.   unknown at unknown   . hydrALAZINE (APRESOLINE) 25 MG tablet Take 25 mg by mouth 3 (three) times daily.    unknown at unknown   . losartan (COZAAR) 100 MG tablet Take 100 mg by mouth daily.   unknown at unknown   . omeprazole (PRILOSEC) 20 MG capsule Take 20 mg by mouth daily.   0 unknown at unknown  . oxyCODONE-acetaminophen (PERCOCET/ROXICET) 5-325 MG tablet Take 1 tablet by mouth every 4 (four) hours as needed for moderate pain. 30 tablet 0 PRN at PRN  . polyethylene glycol (MIRALAX / GLYCOLAX) packet Take 17 g by mouth daily as needed for mild constipation. 14 each 0 PRN at PRN  . pregabalin (LYRICA) 25 MG capsule Take 1 capsule (25 mg total) by mouth 2 (two) times daily. 60 capsule 5 unknown at unknown  . clopidogrel (PLAVIX) 75 MG tablet Take 1 tablet (75 mg total) by mouth daily. (Patient not taking: Reported on 07/15/2015) 30 tablet 5   . feeding supplement, ENSURE COMPLETE, (ENSURE COMPLETE) LIQD Take 237 mLs by mouth 3 (three) times daily with meals. (Patient not taking: Reported on 07/15/2015) 90 Bottle 6 unsure   . trolamine salicylate (ASPERCREME) 10 % cream Apply topically 2 (two) times daily as needed for muscle pain. (Patient not taking: Reported on 07/02/2015) 85 g 0 Completed Course at Unknown time   Home medication reconciliation was completed with the patient.   Scheduled Inpatient Medications:   . sodium chloride   Intravenous Once  . sodium chloride flush  3 mL Intravenous Q12H  . sodium chloride flush  3 mL Intravenous Q12H    Continuous Inpatient Infusions:     PRN Inpatient Medications:  sodium chloride, acetaminophen **OR** acetaminophen, bisacodyl, ondansetron **OR** ondansetron (ZOFRAN) IV, polyethylene glycol, sodium chloride flush  Family History: family history includes CAD in her father; Dementia in her mother.    Social History:   reports that she has quit smoking. Her smoking use included Cigarettes. She has a 7.5 pack-year smoking history. She has never used smokeless tobacco. She reports that she does not drink alcohol or use illicit drugs.  Review of Systems: Constitutional: Weight is stable.  Eyes: No changes in vision. ENT: No oral lesions, sore throat.  GI: see HPI.  Heme/Lymph: No easy bruising.  CV: No chest pain.  GU: No hematuria.  Integumentary: No rashes.  Neuro: No headaches.  Psych: No depression/anxiety.  Endocrine: No heat/cold intolerance.  Allergic/Immunologic: No urticaria.  Resp: No cough, SOB.  Musculoskeletal: No joint swelling.    Physical Examination: BP 125/43 mmHg  Pulse 53  Temp(Src) 97.8 F (36.6 C) (Oral)  Resp 14  Ht 5\' 6"  (1.676 m)  Wt 68.947 kg (152 lb)  BMI 24.55 kg/m2  SpO2 100%  LMP  (LMP Unknown) Gen: NAD, alert and oriented x 4 HEENT: PEERLA, EOMI, Neck: supple, no JVD or thyromegaly Chest: CTA bilaterally, no wheezes, crackles, or other adventitious sounds CV: RRR, no m/g/c/r Abd: soft, NT, ND, +BS in all four quadrants; no HSM, guarding, ridigity, or rebound tenderness Ext: no edema, well perfused with 2+  pulses, Skin: no rash or lesions noted Lymph: no LAD  Data: Lab Results  Component Value Date   WBC 7.4 07/15/2015   HGB 5.9* 07/15/2015   HCT 19.2* 07/15/2015   MCV 96.4 07/15/2015   PLT 296 07/15/2015    Recent Labs Lab 07/14/15 1145 07/15/15 1157  HGB 5.3* 5.9*   Lab Results  Component Value Date   NA 139 07/15/2015   K 4.0 07/15/2015   CL 102 07/15/2015   CO2 28 07/15/2015   BUN 35* 07/15/2015   CREATININE 4.67* 07/15/2015   Lab Results  Component Value Date   ALT 15 07/02/2015   AST 18 07/02/2015   ALKPHOS 245* 07/02/2015   BILITOT 0.8 07/02/2015    Recent Labs Lab 07/15/15 1157  APTT 35  INR 1.14   Assessment/Plan: Ms. Jerabek is a 70 y.o. female history of ESRD d/t mebranous glomerulonephritis, chronic diastolic HF, h/o CVA, pulmonary HTN, CAD, and Afib previously on warfarin admitted for symptomatic anemia.  Patient was told her Hgb had dropped at dialysis, and was 5.3 on arrival to the ED.  Patient also admits to occasional melena, with a black tarry stool in the ED.  FOBT returned positive.  Patient has been admitted multiple times for symptomatic anemia and melena.  EGD about 3 months ago showed a gastric ulcer.  Patient has not had a colonoscopy in man years, so recommend a colonoscopy given continued drops in Hgb despite recent transfusions and discontinuing warfarin.  Will also repeat EGD at this time to follow-up on gastric ulcer.  Further recs per Dr. Rayann Heman.   Recommendations: - Monitor Hgb, transfuse if <7 - Continue IV Protonix q 12 hrs - Plan for EGD & colonoscopy (likely Friday) - further recs per Dr. Rayann Heman  Thank you for the consult. We will follow along with you. Please call with questions or concerns.  Lavera Guise, PA-C [facility] Phone: [phone] Pager: [phone]

## 2015-07-17 NOTE — Op Note (Signed)
[facility] Gastroenterology Patient Name: Toni Carlson Procedure Date: 07/17/2015 3:20 PM MRN: [id number] Account #: [id number] Date of Birth: [birth year] Admit Type: Inpatient Age: 70 Room: [facility] ENDO ROOM 2 Gender: Female Note Status: Finalized Procedure:            Upper GI endoscopy Indications:          Iron deficiency anemia secondary to chronic blood loss,                        Melena Patient Profile:      This is a 70 year old female. Providers:            Gerrit Heck. Rayann Heman, MD Medicines:            Propofol per Anesthesia Complications:        No immediate complications. Estimated blood loss:                        Minimal. Procedure:            Pre-Anesthesia Assessment:                       - Prior to the procedure, a History and Physical was                        performed, and patient medications, allergies and                        sensitivities were reviewed. The patient's tolerance of                        previous anesthesia was reviewed.                       After obtaining informed consent, the endoscope was                        passed under direct vision. Throughout the procedure,                        the patient's blood pressure, pulse, and oxygen                        saturations were monitored continuously. The Endoscope                        was introduced through the mouth, and advanced to the                        second part of duodenum. The upper GI endoscopy was                        accomplished without difficulty. The patient tolerated                        the procedure well. Findings:      The esophagus was normal.      A large, polypoid, 2 cm in size, lesion with no bleeding and no stigmata       of recent bleeding was found in the gastric antrum. Biopsies were taken  with a cold forceps for histology.      The examined duodenum was normal. Impression:           - Normal esophagus.                       -Large  polypoid lesion in the gastric antrum. Biopsied.                        Unclear if this is the source of the anemia.                       - Normal examined duodenum. Recommendation:       - Observe patient in GI recovery unit.                       - Resume regular diet.                       - Continue present medications.                       - Await pathology results.                       - Surgical referrral vs EUS depending on pathology                       - Recommend outpatient colonoscopy to r/o lower source                        of bleeding if she becomes agreeable to colonoscopy.                       - Follow Hgb closely as outpatient.                       - The findings and recommendations were discussed with                        the patient. Procedure Code(s):    --- Professional ---                       [phone], Esophagogastroduodenoscopy, flexible, transoral;                        with biopsy, single or multiple Diagnosis Code(s):    --- Professional ---                       D49.0, Neoplasm of unspecified behavior of digestive                        system                       D50.0, Iron deficiency anemia secondary to blood loss                        (chronic)                       K92.1, Melena (includes Hematochezia) CPT copyright 2016 American [employer] Association. All rights reserved. The codes documented  in this report are preliminary and upon coder review may  be revised to meet current compliance requirements. Mellody Life, MD 07/17/2015 3:52:59 PM This report has been signed electronically. Number of Addenda: 0 Note Initiated On: 07/17/2015 3:20 PM      [facility]

## 2015-07-17 NOTE — Progress Notes (Signed)
GI Inpatient Follow-up Note  Patient Identification: Toni Carlson is a 70 y.o. female with severe anemia, ? melena  Subjective:  No blood in stool, no melena. No abd pain, n/v, f/c.  Hgb now 7.7 after prbc. NO complaints.   Scheduled Inpatient Medications:  . amLODipine  10 mg Oral Daily  . atorvastatin  40 mg Oral Daily  . calcium acetate (Phos Binder)  1,334 mg Oral TID WC  . carvedilol  6.25 mg Oral BID WC  . cinacalcet  60 mg Oral Daily  . hydrALAZINE  25 mg Oral TID  . losartan  100 mg Oral Daily  . pantoprazole (PROTONIX) IV  40 mg Intravenous Q12H  . pregabalin  25 mg Oral BID  . sodium chloride flush  3 mL Intravenous Q12H  . sodium chloride flush  3 mL Intravenous Q12H    Continuous Inpatient Infusions:     PRN Inpatient Medications:  sodium chloride, acetaminophen **OR** acetaminophen, bisacodyl, hydrALAZINE, ondansetron **OR** ondansetron (ZOFRAN) IV, oxyCODONE-acetaminophen, polyethylene glycol, sodium chloride flush  Review of Systems: Constitutional: Weight is stable.  Eyes: No changes in vision. ENT: No oral lesions, sore throat.  GI: see HPI.  Heme/Lymph: No easy bruising.  CV: No chest pain.  GU: No hematuria.  Integumentary: No rashes.  Neuro: No headaches.  Psych: No depression/anxiety.  Endocrine: No heat/cold intolerance.  Allergic/Immunologic: No urticaria.  Resp: No cough, SOB.  Musculoskeletal: No joint swelling.    Physical Examination: BP 143/48 mmHg  Pulse 60  Temp(Src) 98.5 F (36.9 C) (Oral)  Resp 18  Ht 5\' 6"  (1.676 m)  Wt 69.31 kg (152 lb 12.8 oz)  BMI 24.67 kg/m2  SpO2 93%  LMP  (LMP Unknown) Gen: NAD, alert and oriented x 4 HEENT: PEERLA, EOMI, Neck: supple, no JVD or thyromegaly Chest: CTA bilaterally, no wheezes, crackles, or other adventitious sounds CV: RRR, no m/g/c/r Abd: soft, NT, ND, +BS in all four quadrants; no HSM, guarding, ridigity, or rebound tenderness Ext: no edema, well perfused with 2+ pulses, Skin:  no rash or lesions noted Lymph: no LAD  Data: Lab Results  Component Value Date   WBC 7.4 07/16/2015   HGB 7.7* 07/16/2015   HCT 22.8* 07/16/2015   MCV 90.5 07/16/2015   PLT 268 07/16/2015    Recent Labs Lab 07/14/15 1145 07/15/15 1157 07/16/15 0523  HGB 5.3* 5.9* 7.7*   Lab Results  Component Value Date   NA 140 07/16/2015   K 4.3 07/16/2015   CL 103 07/16/2015   CO2 25 07/16/2015   BUN 41* 07/16/2015   CREATININE 5.56* 07/16/2015   Lab Results  Component Value Date   ALT 15 07/02/2015   AST 18 07/02/2015   ALKPHOS 245* 07/02/2015   BILITOT 0.8 07/02/2015    Recent Labs Lab 07/15/15 1157  APTT 35  INR 1.14   Assessment/Plan: [name] is a 70 y.o. female with severe recurrent anemia, ? Melena.  Hx PUD  03/2015.   Recommendations: EGD Friday. NPO after mn She declines colonoscopy.   Please call with questions or concerns.  Toni Carlson, Toni Blight, MD

## 2015-07-17 NOTE — Progress Notes (Signed)
Subjective:  Hgb 7.6 t sats are low Awaiting endoscopy Tolerated HD well yesterday Given iv iron yesterday during HD    Objective:  Vital signs in last 24 hours:  Temp:  [97.1 F (36.2 C)-98.5 F (36.9 C)] 98.5 F (36.9 C) (03/03 0511) Pulse Rate:  [51-60] 55 (03/03 1051) Resp:  [14-18] 18 (03/03 0511) BP: (140-197)/(42-80) 143/50 mmHg (03/03 1051) SpO2:  [93 %-100 %] 93 % (03/03 0511) Weight:  [69.31 kg (152 lb 12.8 oz)-71.532 kg (157 lb 11.2 oz)] 69.31 kg (152 lb 12.8 oz) (03/03 0500)  Weight change: 2.585 kg (5 lb 11.2 oz) Filed Weights   07/16/15 1740 07/16/15 2100 07/17/15 0500  Weight: 71.532 kg (157 lb 11.2 oz) 70 kg (154 lb 5.2 oz) 69.31 kg (152 lb 12.8 oz)    Intake/Output:    Intake/Output Summary (Last 24 hours) at 07/17/15 1326 Last data filed at 07/17/15 1201  Gross per 24 hour  Intake    160 ml  Output   1500 ml  Net  -1340 ml     Physical Exam: General: NAD  HEENT anicteric  Neck supple  Pulm/lungs clear  CVS/Heart No rub  Abdomen:  Soft, NT  Extremities: Trace edema  Neurologic: Alert, oreinted  Skin: No acute rashes  Access: AVF       Basic Metabolic Panel:   Recent Labs Lab 07/15/15 1157 07/16/15 0523  NA 139 140  K 4.0 4.3  CL 102 103  CO2 28 25  GLUCOSE 91 81  BUN 35* 41*  CREATININE 4.67* 5.56*  CALCIUM 7.4* 7.5*     CBC:  Recent Labs Lab 07/14/15 1145 07/15/15 1157 07/16/15 0523 07/17/15 0559  WBC  --  7.4 7.4 7.8  HGB 5.3* 5.9* 7.7* 7.6*  HCT  --  19.2* 22.8* 23.0*  MCV  --  96.4 90.5 92.7  PLT  --  296 268 237      Microbiology:  Recent Results (from the past 720 hour(s))  MRSA PCR Screening     Status: None   Collection Time: 07/02/15  5:50 PM  Result Value Ref Range Status   MRSA by PCR NEGATIVE NEGATIVE Final    Comment:        The GeneXpert MRSA Assay (FDA approved for NASAL specimens only), is one component of a comprehensive MRSA colonization surveillance program. It is not intended to  diagnose MRSA infection nor to guide or monitor treatment for MRSA infections.   MRSA PCR Screening     Status: None   Collection Time: 07/15/15  4:20 PM  Result Value Ref Range Status   MRSA by PCR NEGATIVE NEGATIVE Final    Comment:        The GeneXpert MRSA Assay (FDA approved for NASAL specimens only), is one component of a comprehensive MRSA colonization surveillance program. It is not intended to diagnose MRSA infection nor to guide or monitor treatment for MRSA infections.     Coagulation Studies:  Recent Labs  07/15/15 1157  LABPROT 14.8  INR 1.14    Urinalysis: No results for input(s): COLORURINE, LABSPEC, PHURINE, GLUCOSEU, HGBUR, BILIRUBINUR, KETONESUR, PROTEINUR, UROBILINOGEN, NITRITE, LEUKOCYTESUR in the last 72 hours.  Invalid input(s): APPERANCEUR    Imaging: No results found.   Medications:     . amLODipine  10 mg Oral Daily  . atorvastatin  40 mg Oral Daily  . calcium acetate (Phos Binder)  1,334 mg Oral TID WC  . carvedilol  6.25 mg Oral BID WC  .  cinacalcet  60 mg Oral Daily  . hydrALAZINE  25 mg Oral TID  . losartan  100 mg Oral Daily  . pantoprazole (PROTONIX) IV  40 mg Intravenous Q12H  . pregabalin  25 mg Oral BID  . sodium chloride flush  3 mL Intravenous Q12H  . sodium chloride flush  3 mL Intravenous Q12H   sodium chloride, acetaminophen **OR** acetaminophen, bisacodyl, hydrALAZINE, ondansetron **OR** ondansetron (ZOFRAN) IV, oxyCODONE-acetaminophen, polyethylene glycol, sodium chloride flush  Assessment/ Plan:  70 y.o. female with end stage renal disease on hemodialysis, GERD, anemia, atrial fibrillation, CVA, coronary artery disease, pulmonary hypertension, carotid artery stenosis   TTS [facility] [address]   1. End Stage Renal Disease:  - Continue TTS schedule.   2. Anemia of CKD and GI bleed; and iron deficiency - endoscopy planned  - venofer 200 mg iv  Given with HD  3. Secondary Hyperparathyroidism:  -  phoslyra and sensipar with meals     LOS: 2 Toni Carlson 3/3/20171:26 PM

## 2015-07-17 NOTE — Anesthesia Preprocedure Evaluation (Signed)
Anesthesia Evaluation  Patient identified by MRN, date of birth, ID band Patient awake    Reviewed: Allergy & Precautions, H&P , NPO status , Patient's Chart, lab work & pertinent test results  History of Anesthesia Complications Negative for: history of anesthetic complications  Airway Mallampati: III  TM Distance: >3 FB Neck ROM: full    Dental  (+) Poor Dentition, Chipped, Missing, Upper Dentures   Pulmonary COPD, former smoker,    Pulmonary exam normal breath sounds clear to auscultation       Cardiovascular Exercise Tolerance: Poor hypertension, (-) angina+ CAD, + Peripheral Vascular Disease, +CHF and + DOE  (-) Past MI Normal cardiovascular exam Rhythm:regular Rate:Normal     Neuro/Psych  Neuromuscular disease negative psych ROS   GI/Hepatic Neg liver ROS, PUD,   Endo/Other  negative endocrine ROS  Renal/GU Renal disease  negative genitourinary   Musculoskeletal   Abdominal   Peds  Hematology negative hematology ROS (+)   Anesthesia Other Findings Past Medical History:   Hypertension                                                 ESRD (end stage renal disease) (HCC)                           Comment:On Tue-Thur-Sat dialysis   Chronic diastolic CHF (congestive heart failur*                Comment:a. echo 03/2015: EF 60-65%, normal wall motion,              diastolic parameters were c/w restrictive               physiology, indicative of decreased LV               diastolic compliance and/or increased LA               pressure, mild AS, mild MR, left atrium was               severely dilated, RA was severely dilated, PASP              was severely increased at 90 mm Hg   Anemia                                                       Hyperparathyroidism, secondary renal (HCC)                   Coronary artery disease, non-occlusive                         Comment:a. cardiac cath 2013   H/O ischemic right  MCA stroke                                  Comment:a. 03/2015; b. residual left-sided facial droop              and slurred speech   A-fib ([place])  Comment:a. on warfarin   Pulmonary HTN ([place])                                            Comment:as above   Symptomatic bradycardia                                        Comment:a. history of symptomatic bradycardia; b. felt               to be 2/2 metabolic abnormalities & required               temp wire but no PPM, resolved with HD   Carotid artery stenosis                                        Comment:a. ultrasound 03/2015: RICA 50-69% stenosis,               LICA less than [id number] stenosis  Past Surgical History:   PERIPHERAL VASCULAR CATHETERIZATION             N/A 02/23/2015     Comment:Procedure: A/V Shuntogram/Fistulagram;                Surgeon: Algernon Huxley, MD;  Location: [place] [facility];  Service: Cardiovascular;                Laterality: N/A;   PERIPHERAL VASCULAR CATHETERIZATION             N/A 02/23/2015     Comment:Procedure: A/V Shunt Intervention;  Surgeon:               Algernon Huxley, MD;  Location: [place] [facility]               [facility];  Service: Cardiovascular;  Laterality:               N/A;   ESOPHAGOGASTRODUODENOSCOPY (EGD) WITH PROPOFOL  N/A 04/14/2015     Comment:Procedure: ESOPHAGOGASTRODUODENOSCOPY (EGD)               WITH PROPOFOL;  Surgeon: Lucilla Lame, MD;                Location: [facility] ENDOSCOPY;  Service: Endoscopy;               Laterality: N/A;  BMI    Body Mass Index   24.67 kg/m 2      Reproductive/Obstetrics negative OB ROS                             Anesthesia Physical Anesthesia Plan  ASA: IV  Anesthesia Plan: General   Post-op Pain Management:    Induction:   Airway Management Planned:   Additional Equipment:   Intra-op Plan:   Post-operative Plan:   Informed Consent: I have reviewed the  patients History and Physical, chart, labs and discussed the procedure including the risks, benefits and alternatives for the proposed anesthesia with the patient or authorized representative who has indicated his/her understanding and acceptance.   Dental Advisory Given  Plan Discussed with: Anesthesiologist, CRNA and Surgeon  Anesthesia Plan Comments:         Anesthesia Quick Evaluation

## 2015-07-17 NOTE — Care Management Important Message (Signed)
Important Message  Patient Details  Name: Anai Khatun MRN: [id number] Date of Birth: 1946/01/28   Medicare Important Message Given:  Yes    Juliann Pulse A Ellice Boultinghouse 07/17/2015, 10:28 AM

## 2015-07-17 NOTE — Interval H&P Note (Signed)
History and Physical Interval Note:  07/17/2015 3:23 PM  Toni Carlson  has presented today for surgery, with the diagnosis of anemia  The various methods of treatment have been discussed with the patient and family. After consideration of risks, benefits and other options for treatment, the patient has consented to  Procedure(s): ESOPHAGOGASTRODUODENOSCOPY (EGD) WITH PROPOFOL (N/A) as a surgical intervention .  The patient's history has been reviewed, patient examined, no change in status, stable for surgery.  I have reviewed the patient's chart and labs.  Questions were answered to the patient's satisfaction.     Warren Lindahl GORDON

## 2015-07-17 NOTE — Discharge Summary (Signed)
[facility] [place] NAME: Toni Carlson    MR#:  [id number]  DATE OF BIRTH:  1945-07-01  DATE OF ADMISSION:  07/15/2015 ADMITTING PHYSICIAN: Hillary Bow, MD  DATE OF DISCHARGE: 07/17/2015  PRIMARY CARE PHYSICIAN: Murlean Iba, MD    ADMISSION DIAGNOSIS:  Anemia in chronic illness [D63.8]  DISCHARGE DIAGNOSIS:  Active Problems:   Anemia   GI bleed   SECONDARY DIAGNOSIS:   Past Medical History  Diagnosis Date  . Hypertension   . ESRD (end stage renal disease) ([place])     On Tue-Thur-Sat dialysis  . Chronic diastolic CHF (congestive heart failure) ([place])     a. echo 03/2015: EF 60-65%, normal wall motion, diastolic parameters were c/w restrictive physiology, indicative of decreased LV diastolic compliance and/or increased LA pressure, mild AS, mild MR, left atrium was severely dilated, RA was severely dilated, PASP was severely increased at 90 mm Hg  . Anemia   . Hyperparathyroidism, secondary renal ([place])   . Coronary artery disease, non-occlusive     a. cardiac cath 2013  . H/O ischemic right MCA stroke     a. 03/2015; b. residual left-sided facial droop and slurred speech  . A-fib ([facility])     a. on warfarin  . Pulmonary HTN ([place])     as above  . Symptomatic bradycardia     a. history of symptomatic bradycardia; b. felt to be 2/2 metabolic abnormalities & required temp wire but no PPM, resolved with HD  . Carotid artery stenosis     a. ultrasound 03/2015: RICA 0000000 stenosis, LICA less than [id number] stenosis    HOSPITAL COURSE:   70 year old female with past medical history of end-stage renal disease on hemodialysis, chronic anemia, hypertension, history of diastolic CHF, secondary hyperparathyroidism, history of A. fib, pulmonary hypertension who presented to the hospital due to anemia.  #1 acute on Chronic anemia - acute blood loss anemia, this is due to a GI bleed. -Patient was transfused 2 units of packed red blood cells and  hemoglobin has improved posttransfusion. -Patient will continue to follow hemoglobins as outpatient. Patient is status post endoscopy which showed a polypoid lesion in the gastric antrum which was biopsied. Further follow-up as an outpatient.  #2 GI bleed-this is likely an upper GI bleed. Patient was seen by gastroenterology. Status post endoscopy today showing a small polypoid lesion in the gastric antrum which was biopsied. No acute source of bleeding noted. -Hemoglobin stable posttransfusion. Patient is tolerating a regular diet well. Continue Protonix twice daily and follow up with gastroenterology as an outpatient. Patient will likely need a colonoscopy as an outpatient.  #3 end-stage renal disease and hemodialysis-nephrology was consulted and patient was maintained on the dialysis schedule Tuesday Thursday Saturday and she can resume that.  #4 secondary hyperparathyroidism-she will continue calcium acetate.  #5 hypertension-she will continue losartan, hydralazine, Norvasc, Coreg.  #6 hyperlipidemia-she will continue atorvastatin.  DISCHARGE CONDITIONS:   Stable.   CONSULTS OBTAINED:  Treatment Team:  Josefine Class, MD Dagoberto Ligas, MD  DRUG ALLERGIES:  No Known Allergies  DISCHARGE MEDICATIONS:   Current Discharge Medication List    START taking these medications   Details  pantoprazole (PROTONIX) 40 MG tablet Take 1 tablet (40 mg total) by mouth 2 (two) times daily.      CONTINUE these medications which have CHANGED   Details  oxyCODONE-acetaminophen (PERCOCET/ROXICET) 5-325 MG tablet Take 1 tablet by mouth every 4 (four) hours as needed for moderate  pain. Qty: 30 tablet, Refills: 0      CONTINUE these medications which have NOT CHANGED   Details  amLODipine (NORVASC) 10 MG tablet Take 10 mg by mouth daily.    atorvastatin (LIPITOR) 40 MG tablet Take 40 mg by mouth daily.    calcium acetate, Phos Binder, (PHOSLYRA) 667 MG/5ML SOLN Take 1,334 mg by mouth 3  (three) times daily with meals.    carvedilol (COREG) 6.25 MG tablet Take 6.25 mg by mouth 2 (two) times daily with a meal.    cinacalcet (SENSIPAR) 30 MG tablet Take 60 mg by mouth daily.    hydrALAZINE (APRESOLINE) 25 MG tablet Take 25 mg by mouth 3 (three) times daily.     losartan (COZAAR) 100 MG tablet Take 100 mg by mouth daily.    polyethylene glycol (MIRALAX / GLYCOLAX) packet Take 17 g by mouth daily as needed for mild constipation. Qty: 14 each, Refills: 0    pregabalin (LYRICA) 25 MG capsule Take 1 capsule (25 mg total) by mouth 2 (two) times daily. Qty: 60 capsule, Refills: 5    feeding supplement, ENSURE COMPLETE, (ENSURE COMPLETE) LIQD Take 237 mLs by mouth 3 (three) times daily with meals. Qty: 90 Bottle, Refills: 6    trolamine salicylate (ASPERCREME) 10 % cream Apply topically 2 (two) times daily as needed for muscle pain. Qty: 85 g, Refills: 0      STOP taking these medications     omeprazole (PRILOSEC) 20 MG capsule      clopidogrel (PLAVIX) 75 MG tablet          DISCHARGE INSTRUCTIONS:   DIET:  Cardiac diet and Renal diet  DISCHARGE CONDITION:  Stable  ACTIVITY:  Activity as tolerated  OXYGEN:  Home Oxygen: No.   Oxygen Delivery: room air  DISCHARGE LOCATION:  nursing home   If you experience worsening of your admission symptoms, develop shortness of breath, life threatening emergency, suicidal or homicidal thoughts you must seek medical attention immediately by calling 911 or calling your MD immediately  if symptoms less severe.  You Must read complete instructions/literature along with all the possible adverse reactions/side effects for all the Medicines you take and that have been prescribed to you. Take any new Medicines after you have completely understood and accpet all the possible adverse reactions/side effects.   Please note  You were cared for by a hospitalist during your hospital stay. If you have any questions about your  discharge medications or the care you received while you were in the hospital after you are discharged, you can call the unit and asked to speak with the hospitalist on call if the hospitalist that took care of you is not available. Once you are discharged, your primary care physician will handle any further medical issues. Please note that NO REFILLS for any discharge medications will be authorized once you are discharged, as it is imperative that you return to your primary care physician (or establish a relationship with a primary care physician if you do not have one) for your aftercare needs so that they can reassess your need for medications and monitor your lab values.     Today   No chest pain, Abd pain, no bleeding overnight.  Hg. Stable.    VITAL SIGNS:  Blood pressure 102/71, pulse 60, temperature 97.3 F (36.3 C), temperature source Tympanic, resp. rate 19, height 5\' 6"  (1.676 m), weight 69.31 kg (152 lb 12.8 oz), SpO2 100 %.  I/O:   Intake/Output Summary (  Last 24 hours) at 07/17/15 1616 Last data filed at 07/17/15 1548  Gross per 24 hour  Intake    260 ml  Output   1500 ml  Net  -1240 ml    PHYSICAL EXAMINATION:   GENERAL: 70 y.o.-year-old patient lying in the bed with no acute distress.  EYES: Pupils equal, round, reactive to light and accommodation. No scleral icterus. Extraocular muscles intact.  HEENT: Head atraumatic, normocephalic. Oropharynx and nasopharynx clear.  NECK: Supple, no jugular venous distention. No thyroid enlargement, no tenderness.  LUNGS: Normal breath sounds bilaterally, no wheezing, rales, rhonchi. No use of accessory muscles of respiration.  CARDIOVASCULAR: S1, S2 normal. No murmurs, rubs, or gallops.  ABDOMEN: Soft, nontender, nondistended. Bowel sounds present. No organomegaly or mass.  EXTREMITIES: No cyanosis, clubbing or edema b/l.  NEUROLOGIC: Cranial nerves II through XII are intact. No focal Motor or sensory deficits b/l.   PSYCHIATRIC: The patient is alert and oriented x 3.  SKIN: No obvious rash, lesion, or ulcer.   Left upper extremity AV fistula with good bruit and thrill.  DATA REVIEW:   CBC  Recent Labs Lab 07/17/15 0559  WBC 7.8  HGB 7.6*  HCT 23.0*  PLT 237    Chemistries   Recent Labs Lab 07/16/15 0523  NA 140  K 4.3  CL 103  CO2 25  GLUCOSE 81  BUN 41*  CREATININE 5.56*  CALCIUM 7.5*    Cardiac Enzymes No results for input(s): TROPONINI in the last 168 hours.  Microbiology Results  Results for orders placed or performed during the hospital encounter of 07/15/15  MRSA PCR Screening     Status: None   Collection Time: 07/15/15  4:20 PM  Result Value Ref Range Status   MRSA by PCR NEGATIVE NEGATIVE Final    Comment:        The GeneXpert MRSA Assay (FDA approved for NASAL specimens only), is one component of a comprehensive MRSA colonization surveillance program. It is not intended to diagnose MRSA infection nor to guide or monitor treatment for MRSA infections.     RADIOLOGY:  No results found.    Management plans discussed with the patient, family and they are in agreement.  CODE STATUS:     Code Status Orders        Start     Ordered   07/15/15 1310  Full code   Continuous     07/15/15 1310    Code Status History    Date Active Date Inactive Code Status Order ID Comments User Context   07/02/2015  5:23 PM 07/08/2015  8:09 PM Full Code [id number]  Gladstone Lighter, MD Inpatient   04/12/2015  1:03 PM 04/15/2015  6:00 PM DNR [id number]  Bettey Costa, MD Inpatient   03/26/2015  4:05 PM 04/01/2015  8:24 PM Full Code [id number]  Gladstone Lighter, MD Inpatient   02/23/2015  9:39 AM 02/23/2015  1:41 PM Full Code [id number]  Algernon Huxley, MD Inpatient      TOTAL TIME TAKING CARE OF THIS PATIENT: 40 minutes.    Henreitta Leber M.D on 07/17/2015 at 4:16 PM  Between 7am to 6pm - Pager - [phone]  After 6pm go to [email or web address] - password [email or web address]  [facility]  Office  [phone]  CC: Primary care physician; Murlean Iba, MD

## 2015-07-17 NOTE — Anesthesia Postprocedure Evaluation (Signed)
Anesthesia Post Note  Patient: Toni Carlson  Procedure(s) Performed: Procedure(s) (LRB): ESOPHAGOGASTRODUODENOSCOPY (EGD) WITH PROPOFOL (N/A)  Patient location during evaluation: PACU Anesthesia Type: General Level of consciousness: awake and alert and oriented Pain management: pain level controlled Vital Signs Assessment: post-procedure vital signs reviewed and stable Respiratory status: spontaneous breathing Cardiovascular status: blood pressure returned to baseline Anesthetic complications: no    Last Vitals:  Filed Vitals:   07/17/15 1627 07/17/15 1718  BP: 174/59 160/56  Pulse: 53 52  Temp:  36.4 C  Resp: 16     Last Pain:  Filed Vitals:   07/17/15 1718  PainSc: 0-No pain                 Lavon Horn

## 2015-07-17 NOTE — Anesthesia Procedure Notes (Signed)
Date/Time: 07/17/2015 3:28 PM Performed by: Doreen Salvage Pre-anesthesia Checklist: Patient identified, Emergency Drugs available, Suction available and Patient being monitored Patient Re-evaluated:Patient Re-evaluated prior to inductionOxygen Delivery Method: Nasal cannula Intubation Type: IV induction Dental Injury: Teeth and Oropharynx as per pre-operative assessment  Comments: Nasal cannula with etCO2 monitoring

## 2015-07-17 NOTE — Transfer of Care (Signed)
Immediate Anesthesia Transfer of Care Note  Patient: Toni Carlson  Procedure(s) Performed: Procedure(s): ESOPHAGOGASTRODUODENOSCOPY (EGD) WITH PROPOFOL (N/A)  Patient Location: PACU and Endoscopy Unit  Anesthesia Type:General  Level of Consciousness: sedated  Airway & Oxygen Therapy: Patient Spontanous Breathing and Patient connected to nasal cannula oxygen  Post-op Assessment: Report given to RN and Post -op Vital signs reviewed and stable  Post vital signs: Reviewed and stable  Last Vitals:  Filed Vitals:   07/17/15 1554 07/17/15 1557  BP: 124/32 135/42  Pulse: 57 57  Temp: 36.3 C 36.3 C  Resp: 20 18    Complications: No apparent anesthesia complications

## 2015-07-17 NOTE — Progress Notes (Signed)
Notified dr Gus Height patel about pt bp. PRN orders for hydralazine IV if SBP >180. SBP currently 178. Will continue to monitor BP.

## 2015-07-20 ENCOUNTER — Encounter: Payer: Self-pay | Admitting: Gastroenterology

## 2015-07-21 LAB — SURGICAL PATHOLOGY

## 2015-08-07 ENCOUNTER — Telehealth: Payer: Self-pay

## 2015-08-07 NOTE — Telephone Encounter (Signed)
  Oncology Nurse Navigator Documentation  Navigator Location: [facility] (08/07/15 1400) Navigator Encounter Type: Telephone;Introductory phone call (08/07/15 1400) Telephone: Outgoing Call (08/07/15 1400) Abnormal Finding Date: 07/17/15 (08/07/15 1400)           Barriers/Navigation Needs: Coordination of Care (08/07/15 1400)   Interventions: Coordination of Care (08/07/15 1400)   Coordination of Care: EUS (08/07/15 1400)        Acuity: Level 1 (08/07/15 1400)         Time Spent with Patient: 30 (08/07/15 1400)   Received referral for EUS for antrum nodule. Services not available until 08/27/15. Amy at [facility] notified and that date in okay. If needed sooner it can be scheduled at [facility] and she will make me aware. Voicemail left for Ms Hardeman to return call.

## 2015-08-10 ENCOUNTER — Telehealth: Payer: Self-pay

## 2015-08-10 NOTE — Telephone Encounter (Signed)
  Oncology Nurse Navigator Documentation  Navigator Location: [facility] (08/10/15 1300) Navigator Encounter Type: Telephone (08/10/15 1300) Telephone: [place] Call (08/10/15 1300)                     Coordination of Care: EUS (08/10/15 1300)                  Time Spent with Patient: 15 (08/10/15 1300)   Voicemail left to return call regarding scheduling EUS

## 2015-08-11 ENCOUNTER — Telehealth: Payer: Self-pay

## 2015-08-11 NOTE — Telephone Encounter (Signed)
  Oncology Nurse Navigator Documentation  Navigator Location: [facility] (08/11/15 0900) Navigator Encounter Type: Telephone (08/11/15 0900) Telephone: Outgoing Call (08/11/15 0900)                     Coordination of Care: EUS (08/11/15 0900)                  Time Spent with Patient: 30 (08/11/15 0900)   Toni Carlson found to be at [facility]. Called and spoke with her nurse Anderson Malta. Scheduled EUS for 08-27-15 at [facility] with Dr Francella Solian. She has dialysis on Tues/Thurs. Dialysis is scheduled for first thing in the am. EUS will likely be in the afternoon at 1300 or 1400. Went over instructions with nurse and copy of instructions faxed to her at [facility] with my contact information for any further questions or concerns. Confirmed patient is not on any blood thinners and is not diabetic.  INSTRUCTIONS FOR ENDOSCOPIC ULTRASOUND -Your procedure has been scheduled for April 13th with Dr Francella Solian at [facility] -The hospital will contact you to pre-register over the phone. If for any reason you have not received a call within one week prior to your scheduled procedure date, please call [phone]. -To get your scheduled arrival time, please call the Endoscopy unit at  [phone] between 1-3pm on: April 12th    -ON THE DAY OF YOU PROCEDURE:   1. If you are scheduled for a morning procedure, nothing to drink after midnight  -If you are scheduled for an afternoon procedure, you may have clear liquids until 5 hours prior  to the procedure but no carbonated drinks or broth  2. NO FOOD THE DAY OF YOUR PROCEDURE  3. You may take your heart, seizure, blood pressure, Parkinson's or breathing medications at  6am with just enough water to get your pills down  4. Do not take any oral Diabetic medications the morning of your procedure.  5. If you are a diabetic and are using insulin, please notify your prescribing physician of this  procedure as your dose may need to be altered related to not being able to eat or  drink.   5. Do not take Vitamins     -On the day of your procedure, come to the [facility] Admitting/Registration desk (First desk on the right) at the scheduled arrival time. You MUST have someone drive you home from your procedure. You must have a responsible adult with a valid drivers license who is on site throughout your entire procedure and who can stay with you for several hours after your procedure. You may not go home alone in a taxi, shuttle [place] or bus, as the drivers will not be responsible for you.  --If you have any questions please call me at the above contact

## 2015-08-18 DIAGNOSIS — K317 Polyp of stomach and duodenum: Secondary | ICD-10-CM | POA: Insufficient documentation

## 2015-08-24 ENCOUNTER — Other Ambulatory Visit: Payer: Self-pay | Admitting: Vascular Surgery

## 2015-08-26 ENCOUNTER — Encounter: Payer: Self-pay | Admitting: *Deleted

## 2015-08-27 ENCOUNTER — Encounter
Admission: RE | Disposition: A | Discharge status: Home / Self Care | Payer: Self-pay | Source: Ambulatory Visit | Attending: Gastroenterology

## 2015-08-27 ENCOUNTER — Ambulatory Visit
Admission: RE | Admit: 2015-08-27 | Discharge: 2015-08-27 | Disposition: A | Discharge status: Home / Self Care | Payer: Medicare Other | Source: Ambulatory Visit | Attending: Gastroenterology | Admitting: Gastroenterology

## 2015-08-27 ENCOUNTER — Telehealth: Payer: Self-pay

## 2015-08-27 ENCOUNTER — Encounter: Payer: Self-pay | Admitting: Anesthesiology

## 2015-08-27 SURGERY — LOWER EXTREMITY ANGIOGRAPHY
Anesthesia: Moderate Sedation | Site: Leg Lower | Laterality: Right

## 2015-08-27 SURGERY — UPPER ENDOSCOPIC ULTRASOUND (EUS) LINEAR
Anesthesia: General

## 2015-08-27 MED ORDER — ONDANSETRON HCL 4 MG/2ML IJ SOLN: 4.0000 mg | Freq: Four times a day (QID) | INTRAMUSCULAR | Status: DC | PRN

## 2015-08-27 MED ORDER — SODIUM CHLORIDE 0.9 % IV SOLN: INTRAVENOUS | Status: DC

## 2015-08-27 MED ORDER — HYDROMORPHONE HCL 1 MG/ML IJ SOLN: 1.0000 mg | Freq: Once | INTRAMUSCULAR | Status: DC

## 2015-08-27 MED ORDER — METHYLPREDNISOLONE SODIUM SUCC 125 MG IJ SOLR
125.0000 mg | INTRAMUSCULAR | Status: DC | PRN
  Filled 2015-08-27: qty 2

## 2015-08-27 MED ORDER — FAMOTIDINE 40 MG PO TABS
40.0000 mg | ORAL_TABLET | ORAL | Status: DC | PRN
Start: 2015-08-27 — End: 2015-08-29
  Filled 2015-08-27: qty 1

## 2015-08-27 MED ORDER — CEFUROXIME SODIUM 1.5 G IJ SOLR
1.5000 g | INTRAMUSCULAR | Status: DC
  Filled 2015-08-27: qty 1.5

## 2015-08-27 NOTE — Telephone Encounter (Signed)
  Oncology Nurse Navigator Documentation  Navigator Location: [facility] (08/27/15 1500) Navigator Encounter Type: Telephone (08/27/15 1500) Telephone: [place] Call (08/27/15 1500)                     Coordination of Care: EUS (08/27/15 1500)                  Time Spent with Patient: 15 (08/27/15 1500)   Toni Carlson at Dr Conley Canal office notified that EUS could not be performed due to patient eating 1.5 hours prior to arrival time. Will be rescheduled for 09-24-15. Will notify [facility] again with date and instructions for EUS and also notify daughter of the same.

## 2015-08-28 ENCOUNTER — Telehealth: Payer: Self-pay

## 2015-08-28 NOTE — Telephone Encounter (Signed)
  Oncology Nurse Navigator Documentation  Navigator Location: [facility] (08/28/15 1000) Navigator Encounter Type: Telephone (08/28/15 1000) Telephone: Outgoing Call (08/28/15 1000)                     Coordination of Care: EUS (08/28/15 1000)                  Time Spent with Patient: 30 (08/28/15 1000)   Contacted [facility] and spoke with Janett Billow regarding EUS being rescheduled for 09-24-15 at [facility]. Went over instructions and copy faxed to [name] for her records. She has dialysis on Thursdays so she will need afternoon appt. Also contacted her daughter and went over the date and instructions with her. Copy mailed to her address. Provided my contact information for any future questions or concerns. Endo notified of new date.  INSTRUCTIONS FOR ENDOSCOPIC ULTRASOUND -Your procedure has been scheduled for May 11th with Dr Francella Solian at [facility] -The hospital will contact you to pre-register over the phone. If for any reason you have not received a call within one week prior to your scheduled procedure date, please call [phone]. -To get your scheduled arrival time, please call the Endoscopy unit at  [phone] between 1-3pm on: May 10th     -ON THE DAY OF YOU PROCEDURE:   1. If you are scheduled for a morning procedure, nothing to drink after midnight  -If you are scheduled for an afternoon procedure, you may have clear liquids until 5 hours prior  to the procedure but no carbonated drinks or broth  2. NO FOOD THE DAY OF YOUR PROCEDURE  3. You may take your heart, seizure, blood pressure, Parkinson's or breathing medications at  6am with just enough water to get your pills down  4. Do not take any oral Diabetic medications the morning of your procedure.  5. If you are a diabetic and are using insulin, please notify your prescribing physician of this  procedure as your dose may need to be altered related to not being able to eat or drink.   5. Do not take Vitamins     -On the day  of your procedure, come to the [facility] Admitting/Registration desk (First desk on the right) at the scheduled arrival time. You MUST have someone drive you home from your procedure. You must have a responsible adult with a valid drivers license who is on site throughout your entire procedure and who can stay with you for several hours after your procedure. You may not go home alone in a taxi, shuttle [place] or bus, as the drivers will not be responsible for you.  --If you have any questions please call me at the above contact

## 2015-09-12 ENCOUNTER — Encounter: Payer: Self-pay | Admitting: Emergency Medicine

## 2015-09-12 ENCOUNTER — Emergency Department: Payer: Medicare Other

## 2015-09-12 ENCOUNTER — Emergency Department
Admission: EM | Admit: 2015-09-12 | Discharge: 2015-09-12 | Disposition: A | Discharge status: Home / Self Care | Payer: Medicare Other | Attending: Emergency Medicine | Admitting: Emergency Medicine

## 2015-09-12 DIAGNOSIS — I5032 Chronic diastolic (congestive) heart failure: Secondary | ICD-10-CM | POA: Diagnosis not present

## 2015-09-12 DIAGNOSIS — I2510 Atherosclerotic heart disease of native coronary artery without angina pectoris: Secondary | ICD-10-CM | POA: Insufficient documentation

## 2015-09-12 DIAGNOSIS — M7918 Myalgia, other site: Secondary | ICD-10-CM

## 2015-09-12 DIAGNOSIS — Z992 Dependence on renal dialysis: Secondary | ICD-10-CM | POA: Diagnosis not present

## 2015-09-12 DIAGNOSIS — M79671 Pain in right foot: Secondary | ICD-10-CM | POA: Insufficient documentation

## 2015-09-12 DIAGNOSIS — Z79899 Other long term (current) drug therapy: Secondary | ICD-10-CM | POA: Insufficient documentation

## 2015-09-12 DIAGNOSIS — Z87891 Personal history of nicotine dependence: Secondary | ICD-10-CM | POA: Insufficient documentation

## 2015-09-12 DIAGNOSIS — E875 Hyperkalemia: Secondary | ICD-10-CM | POA: Diagnosis not present

## 2015-09-12 DIAGNOSIS — I4891 Unspecified atrial fibrillation: Secondary | ICD-10-CM | POA: Diagnosis not present

## 2015-09-12 DIAGNOSIS — I132 Hypertensive heart and chronic kidney disease with heart failure and with stage 5 chronic kidney disease, or end stage renal disease: Secondary | ICD-10-CM | POA: Diagnosis not present

## 2015-09-12 DIAGNOSIS — N186 End stage renal disease: Secondary | ICD-10-CM | POA: Diagnosis not present

## 2015-09-12 DIAGNOSIS — Z8673 Personal history of transient ischemic attack (TIA), and cerebral infarction without residual deficits: Secondary | ICD-10-CM | POA: Diagnosis not present

## 2015-09-12 LAB — CBC
HCT: 32.7 % — ABNORMAL LOW (ref 35.0–47.0)
HEMOGLOBIN: 10.4 g/dL — AB (ref 12.0–16.0)
MCH: 29.7 pg (ref 26.0–34.0)
MCHC: 31.9 g/dL — AB (ref 32.0–36.0)
MCV: 93.3 fL (ref 80.0–100.0)
Platelets: 124 10*3/uL — ABNORMAL LOW (ref 150–440)
RBC: 3.5 MIL/uL — ABNORMAL LOW (ref 3.80–5.20)
RDW: 19.8 % — AB (ref 11.5–14.5)
WBC: 4.7 10*3/uL (ref 3.6–11.0)

## 2015-09-12 LAB — BASIC METABOLIC PANEL
Anion gap: 17 — ABNORMAL HIGH (ref 5–15)
BUN: 92 mg/dL — AB (ref 6–20)
CO2: 21 mmol/L — AB (ref 22–32)
CREATININE: 8.41 mg/dL — AB (ref 0.44–1.00)
Calcium: 8.5 mg/dL — ABNORMAL LOW (ref 8.9–10.3)
Chloride: 103 mmol/L (ref 101–111)
GFR calc Af Amer: 5 mL/min — ABNORMAL LOW (ref >60)
GFR calc non Af Amer: 4 mL/min — ABNORMAL LOW (ref >60)
Glucose, Bld: 99 mg/dL (ref 65–99)
Potassium: 5.3 mmol/L — ABNORMAL HIGH (ref 3.5–5.1)
SODIUM: 141 mmol/L (ref 135–145)

## 2015-09-12 MED ORDER — TROLAMINE SALICYLATE 10 % EX CREA: TOPICAL_CREAM | Freq: Two times a day (BID) | CUTANEOUS | Status: DC | PRN

## 2015-09-12 MED ORDER — TRAMADOL HCL 50 MG PO TABS: 50.0000 mg | ORAL_TABLET | Freq: Four times a day (QID) | ORAL | Status: DC | PRN

## 2015-09-12 NOTE — Progress Notes (Signed)
LCSW called Daughter Ms Danielle Dess [phone] and she reported she has called someone to build her Mom a ramp ( also informed her Katina Degree has a ramp for sale) Explained that Dr will attempt to get her Mom to Dialysis this afternoon and he will write a script for a wheelchair. Will keep her posted when her Mom will be discharged.  [employer] LCSW [phone]

## 2015-09-12 NOTE — Discharge Instructions (Signed)
Please call the number provided for orthopedics to arrange a follow-up appointment regarding her right foot discomfort. Your x-ray did not show any acute changes today. You have received dialysis from the emergency department today. As we discussed with the social worker please use her wheelchair at home when needed. Please take your pain medication as needed, as prescribed and use her walker if you're able to. Please have your ramp completed as soon as possible so they you are able to attend her dialysis sessions.  Musculoskeletal Pain Musculoskeletal pain is muscle and boney aches and pains. These pains can occur in any part of the body. Your caregiver may treat you without knowing the cause of the pain. They may treat you if blood or urine tests, X-rays, and other tests were normal.  CAUSES There is often not a definite cause or reason for these pains. These pains may be caused by a type of germ (virus). The discomfort may also come from overuse. Overuse includes working out too hard when your body is not fit. Boney aches also come from weather changes. Bone is sensitive to atmospheric pressure changes. HOME CARE INSTRUCTIONS   Ask when your test results will be ready. Make sure you get your test results.  Only take over-the-counter or prescription medicines for pain, discomfort, or fever as directed by your caregiver. If you were given medications for your condition, do not drive, operate machinery or power tools, or sign legal documents for 24 hours. Do not drink alcohol. Do not take sleeping pills or other medications that may interfere with treatment.  Continue all activities unless the activities cause more pain. When the pain lessens, slowly resume normal activities. Gradually increase the intensity and duration of the activities or exercise.  During periods of severe pain, bed rest may be helpful. Lay or sit in any position that is comfortable.  Putting ice on the injured area.  Put ice  in a bag.  Place a towel between your skin and the bag.  Leave the ice on for 15 to 20 minutes, 3 to 4 times a day.  Follow up with your caregiver for continued problems and no reason can be found for the pain. If the pain becomes worse or does not go away, it may be necessary to repeat tests or do additional testing. Your caregiver may need to look further for a possible cause. SEEK IMMEDIATE MEDICAL CARE IF:  You have pain that is getting worse and is not relieved by medications.  You develop chest pain that is associated with shortness or breath, sweating, feeling sick to your stomach (nauseous), or throw up (vomit).  Your pain becomes localized to the abdomen.  You develop any new symptoms that seem different or that concern you. MAKE SURE YOU:   Understand these instructions.  Will watch your condition.  Will get help right away if you are not doing well or get worse.   This information is not intended to replace advice given to you by your health care provider. Make sure you discuss any questions you have with your health care provider.   Document Released: 05/02/2005 Document Revised: 07/25/2011 Document Reviewed: 01/04/2013 Elsevier Interactive Patient Education [employer].

## 2015-09-12 NOTE — Progress Notes (Signed)
  Subjective:  Patient presented to ER for rt foot pain She has unable to go to outpatient dialysis. Transport van cannot pick her up as she does not have a ramp at her home for wheelchair transport.    Objective:  Vital signs in last 24 hours:  Temp:  [98 F (36.7 C)] 98 F (36.7 C) (04/29 0927) Pulse Rate:  [52-104] 52 (04/29 1330) Resp:  [16-21] 16 (04/29 1330) BP: (113-156)/(57-87) 113/57 mmHg (04/29 1330) SpO2:  [96 %-99 %] 99 % (04/29 1330) Weight:  [73.347 kg (161 lb 11.2 oz)] 73.347 kg (161 lb 11.2 oz) (04/29 0927)  Weight change:  Filed Weights   09/12/15 0927  Weight: 73.347 kg (161 lb 11.2 oz)    Intake/Output:   No intake or output data in the 24 hours ending 09/12/15 1346   Physical Exam: General: NAD, laying in bed  HEENT Anicteric, moist mucus membranes  Neck supple  Pulm/lungs Clear b/l  CVS/Heart No rub, S 4 gallop  Abdomen:  Soft, NT,   Extremities: Trace peripheral   Neurologic: Alert, oriented  Skin: erytherma over rt foot (chronic)  Access: AVG       Basic Metabolic Panel:   Recent Labs Lab 09/12/15 0945  NA 141  K 5.3*  CL 103  CO2 21*  GLUCOSE 99  BUN 92*  CREATININE 8.41*  CALCIUM 8.5*     CBC:  Recent Labs Lab 09/12/15 0945  WBC 4.7  HGB 10.4*  HCT 32.7*  MCV 93.3  PLT 124*      Microbiology:  No results found for this or any previous visit (from the past 720 hour(s)).  Coagulation Studies: No results for input(s): LABPROT, INR in the last 72 hours.  Urinalysis: No results for input(s): COLORURINE, LABSPEC, PHURINE, GLUCOSEU, HGBUR, BILIRUBINUR, KETONESUR, PROTEINUR, UROBILINOGEN, NITRITE, LEUKOCYTESUR in the last 72 hours.  Invalid input(s): APPERANCEUR    Imaging: Dg Foot Complete Right  09/12/2015  CLINICAL DATA:  right foot pain x 1 month, reports fall and recent CVA. Dialysis pt. EXAM: RIGHT FOOT COMPLETE - 3+ VIEW COMPARISON:  None. FINDINGS: Severe diffuse osteopenia. Small vessel calcification. No  fracture or dislocation. IMPRESSION: Negative for acute abnormality although severe osteopenia lowers sensitivity for potential focal abnormalities. Electronically Signed   By: Skipper Cliche M.D.   On: 09/12/2015 09:57     Medications:       Assessment/ Plan:  70 y.o. female with ESRD, on hemodialysis, GERD, anemia, atrial fibrillation, CVA, coronary artery disease, pulmonary hypertension, carotid artery stenosis    [place]  1. Uremia and hyperkalemia with ESRD and dialysis status  - urgent HD today as patient has had not dialyzed since Tuesday. (missed Thursday)  - orders prepared and care coordinated with ER ; dialysis staff alerted  Patient's daughter is working with a Training and development officer to get a ramp bulit at her house. This will allow [facility] transportation to be able to transport her to the hospital    LOS:  [place] 4/29/20171:46 PM

## 2015-09-12 NOTE — ED Notes (Signed)
Patient still off unit in dialysis, Report given to Margarita Grizzle, [place]

## 2015-09-12 NOTE — ED Notes (Signed)
Patient brought in by [facility] from home c/o right foot pain. Patient is dialysis patient, she was supposed to have dialysis treatment today but she was removed from the schedule. Patient has recent hx/o CVA and has been having issues with her right foot since then, patient was just discharged from [facility] care on Wednesday

## 2015-09-12 NOTE — ED Notes (Signed)
Attempted to get patient up to ambulate her with a walker. Patient was unable to stand from the bed due to pain in her foot.

## 2015-09-12 NOTE — Progress Notes (Signed)
Pre dialysis assessment 

## 2015-09-12 NOTE — Clinical Social Work Note (Signed)
Clinical Social Work Assessment  Patient Details  Name: Toni Carlson MRN: [id number] Date of Birth: 1946/03/17  Date of referral:  09/12/15               Reason for consult:  Other (Comment Required) (Unable to walk down stairs to get onto bus for dialysis)                Permission sought to share information with:  Family Supports Permission granted to share information::  Yes, Verbal Permission Granted  Name::     Orvan Falconer [phone]  Agency::  na  Relationship::  yes  Contact Information:  yes  Housing/Transportation Living arrangements for the past 2 months:  [place] (recently discharge after short term re-hab) Source of Information:  Patient Patient Interpreter Needed:  None Criminal Activity/Legal Involvement Pertinent to Current Situation/Hospitalization:  No - Comment as needed Significant Relationships:  Adult Children Lives with:  Self Do you feel safe going back to the place where you live?  Yes Need for family participation in patient care:  Yes (Comment)  Care giving concerns: Daughter reports her Mom does not listen to her and complains of a lot of pain in her feet   Social Worker assessment / plan: LCSW called patients daughter with her consent. Daughter reports she came from [facility] and they did absolutely nothing. Her mother constantly complains of pain in her feet and wont get out of bed and cant walk. She has been home since Wed and has missed all her dialysis appointments. Daughter is looking into finding someone to build a ramp for her Mom but there is no need until her feet are taken care of  Employment status:  Disabled (Comment on whether or not currently receiving Disability) Insurance information:  Medicare, Medicaid [place] PT Recommendations:  Not assessed at this time Information / Referral to community resources:  Other (Comment Required) (Needs to find a ramp contractor to assist with her coming down  stairs)  Patient/Family's Response to care:  Get her feet fixed  Patient/Family's Understanding of and Emotional Response to Diagnosis, Current Treatment, and Prognosis:  Her Mom cant walk and does not listen to her daughter at all.  Emotional Assessment Appearance:  Appears stated age Attitude/Demeanor/Rapport:   (Polite and pleasant) Affect (typically observed):  Appropriate Orientation:  Oriented to Self, Oriented to Place, Oriented to  Time, Oriented to Situation Alcohol / Substance use:  Never Used Psych involvement (Current and /or in the community):  No (Comment)  Discharge Needs  Concerns to be addressed:  Discharge Planning Concerns, Home Safety Concerns Readmission within the last 30 days:  No Current discharge risk:  Lack of support system, Dependent with Mobility Barriers to Discharge:  Unsafe home situation   [name], LCSW 09/12/2015, 10:03 AM

## 2015-09-12 NOTE — Progress Notes (Signed)
LCSW called patients daughter Ms Danielle Dess [phone] and her daughter reports her Mom cant walk and [facility] did nothing for her that's why she is now home since Wednesday and does not listen to her at all. Daughter is working on getting a ramp for her Mom but not until we fix her feet. LCSW explained she will be seen by doctor and then discharged home she stated she cant come and get her. Patients daughter was informed of special funding through medicaid and medicare to assist with the  Building costs and to look online for further information. LCSW will consult with Doctor/ed Nurse.   Enis Slipper LCSW [phone] Omarrion Carmer LCSW

## 2015-09-12 NOTE — ED Notes (Signed)
Pt called home, daughter present, EMS called for transport.

## 2015-09-12 NOTE — ED Notes (Signed)
Dr Singh at bedside

## 2015-09-12 NOTE — ED Notes (Signed)
Patient transported to dialysis by orderly.

## 2015-09-12 NOTE — ED Provider Notes (Signed)
[facility] Emergency Department Provider Note  Time seen: 9:36 AM  I have reviewed the triage vital signs and the nursing notes.   HISTORY  Chief Complaint Foot Pain    HPI Toni Carlson is a 70 y.o. female with a past medical history of hypertension, end-stage renal disease on dialysis Tuesday, Thursday, Saturday, CHF, A. fib, presents to the emergency department for right foot pain. According to the patient she has stroke approximately 2 months ago. She has been at a rehabilitation facility since then. She was discharged approximately 5 days ago from the rehabilitation facility. She states she has right foot pain which has been ongoing for 2 or 3 months. She uses a walker to walk but has pain even when ambulating with her walker. Patient states since going home she has no way to get down her front steps to get to her transportation service to get to dialysis. She missed her dialysis appointment Thursday, and when the transportation people came today to pick her up she was not able to get down her steps to get to the truck so she called EMS. Patient has been told she needs a ramp so she can use her walker to get down the ramp. She believes her daughter is working on this but does not have any timeline for when it will be completed. Patient states her foot pain is moderate, worse with walking on it, but largely unchanged for the past 2 months. Denies any fever. Denies any vomiting. Denies any recent injuries to the foot.     Past Medical History  Diagnosis Date  . Hypertension   . ESRD (end stage renal disease) ([place])     On Tue-Thur-Sat dialysis  . Chronic diastolic CHF (congestive heart failure) ([place])     a. echo 03/2015: EF 60-65%, normal wall motion, diastolic parameters were c/w restrictive physiology, indicative of decreased LV diastolic compliance and/or increased LA pressure, mild AS, mild MR, left atrium was severely dilated, RA was severely dilated, PASP  was severely increased at 90 mm Hg  . Anemia   . Hyperparathyroidism, secondary renal ([place])   . Coronary artery disease, non-occlusive     a. cardiac cath 2013  . H/O ischemic right MCA stroke     a. 03/2015; b. residual left-sided facial droop and slurred speech  . A-fib ([facility])     a. on warfarin  . Pulmonary HTN ([place])     as above  . Symptomatic bradycardia     a. history of symptomatic bradycardia; b. felt to be 2/2 metabolic abnormalities & required temp wire but no PPM, resolved with HD  . Carotid artery stenosis     a. ultrasound 03/2015: RICA 0000000 stenosis, LICA less than [id number] stenosis    Patient Active Problem List   Diagnosis Date Noted  . Anemia 07/15/2015  . GI bleed 07/15/2015  . Polyneuropathy ([place]) 07/08/2015  . Degenerative disc disease, lumbar 07/08/2015  . Pancytopenia ([place]) 07/08/2015  . Generalized weakness 07/08/2015  . End stage renal disease ([place]) 07/08/2015  . Atherosclerotic peripheral vascular disease ([place]) 07/08/2015  . Pain in both feet 07/02/2015  . Gastric ulceration   . Acute GI bleeding   . GIB (gastrointestinal bleeding) 04/12/2015  . Left-sided weakness 04/01/2015  . Essential hypertension, malignant 04/01/2015  . Anemia of chronic disease 04/01/2015  . Elevated troponin 04/01/2015  . Pulmonary hypertension ([place]) 04/01/2015  . Atrial fibrillation ([place])   . CVA (cerebral infarction) 03/26/2015    Past  Surgical History  Procedure Laterality Date  . Peripheral vascular catheterization N/A 02/23/2015    Procedure: A/V Shuntogram/Fistulagram;  Surgeon: Algernon Huxley, MD;  Location: [place] [facility];  Service: Cardiovascular;  Laterality: N/A;  . Peripheral vascular catheterization N/A 02/23/2015    Procedure: A/V Shunt Intervention;  Surgeon: Algernon Huxley, MD;  Location: [place] [facility];  Service: Cardiovascular;  Laterality: N/A;  . Esophagogastroduodenoscopy (egd) with propofol N/A 04/14/2015    Procedure: ESOPHAGOGASTRODUODENOSCOPY  (EGD) WITH PROPOFOL;  Surgeon: Lucilla Lame, MD;  Location: [facility] ENDOSCOPY;  Service: Endoscopy;  Laterality: N/A;  . Esophagogastroduodenoscopy (egd) with propofol N/A 07/17/2015    Procedure: ESOPHAGOGASTRODUODENOSCOPY (EGD) WITH PROPOFOL;  Surgeon: Josefine Class, MD;  Location: [facility] ENDOSCOPY;  Service: Endoscopy;  Laterality: N/A;    Current Outpatient Rx  Name  Route  Sig  Dispense  Refill  . amLODipine (NORVASC) 10 MG tablet   Oral   Take 10 mg by mouth daily.         Marland Kitchen atorvastatin (LIPITOR) 40 MG tablet   Oral   Take 40 mg by mouth daily.         . calcium acetate, Phos Binder, (PHOSLYRA) 667 MG/5ML SOLN   Oral   Take 1,334 mg by mouth 3 (three) times daily with meals.         . carvedilol (COREG) 6.25 MG tablet   Oral   Take 6.25 mg by mouth 2 (two) times daily with a meal.         . cinacalcet (SENSIPAR) 30 MG tablet   Oral   Take 60 mg by mouth daily.         . feeding supplement, ENSURE COMPLETE, (ENSURE COMPLETE) LIQD   Oral   Take 237 mLs by mouth 3 (three) times daily with meals. Patient not taking: Reported on 07/15/2015   90 Bottle   6   . hydrALAZINE (APRESOLINE) 25 MG tablet   Oral   Take 25 mg by mouth 3 (three) times daily.          Marland Kitchen losartan (COZAAR) 100 MG tablet   Oral   Take 100 mg by mouth daily.         Marland Kitchen oxyCODONE-acetaminophen (PERCOCET/ROXICET) 5-325 MG tablet   Oral   Take 1 tablet by mouth every 4 (four) hours as needed for moderate pain.   30 tablet   0   . pantoprazole (PROTONIX) 40 MG tablet   Oral   Take 1 tablet (40 mg total) by mouth 2 (two) times daily.         . polyethylene glycol (MIRALAX / GLYCOLAX) packet   Oral   Take 17 g by mouth daily as needed for mild constipation.   14 each   0   . pregabalin (LYRICA) 25 MG capsule   Oral   Take 1 capsule (25 mg total) by mouth 2 (two) times daily.   60 capsule   5   . trolamine salicylate (ASPERCREME) 10 % cream   Topical   Apply topically 2 (two)  times daily as needed for muscle pain. Patient not taking: Reported on 07/02/2015   85 g   0     Allergies Review of patient's allergies indicates no known allergies.  Family History  Problem Relation Age of Onset  . CAD Father   . Dementia Mother     Social History Social History  Substance Use Topics  . Smoking status: Former Smoker -- 0.25 packs/day  for 30 years    Types: Cigarettes  . Smokeless tobacco: Never Used  . Alcohol Use: No    Review of Systems Constitutional: Negative for fever. Cardiovascular: Negative for chest pain. Respiratory: Negative for shortness of breath. Gastrointestinal: Negative for abdominal pain Musculoskeletal: Right foot pain Neurological: Negative for headaches, denies any new focal weakness or numbness. 10-point ROS otherwise negative.  ____________________________________________   PHYSICAL EXAM:  VITAL SIGNS: ED Triage Vitals  Enc Vitals Group     BP 09/12/15 0927 156/67 mmHg     Pulse Rate 09/12/15 0927 85     Resp 09/12/15 0927 16     Temp 09/12/15 0927 98 F (36.7 C)     Temp Source 09/12/15 0927 Oral     SpO2 09/12/15 0927 98 %     Weight 09/12/15 0927 161 lb 11.2 oz (73.347 kg)     Height 09/12/15 0927 5\' 6"  (1.676 m)     Head Cir --      Peak Flow --      Pain Score 09/12/15 0926 8     Pain Loc --      Pain Edu? --      Excl. in [place]? --     Constitutional: Alert and oriented. Well appearing and in no distress. Eyes: Normal exam ENT   Head: Normocephalic and atraumatic.   Mouth/Throat: Mucous membranes are moist. Cardiovascular: Normal rate, regular rhythm. Respiratory: Normal respiratory effort without tachypnea nor retractions. Breath sounds are clear  Gastrointestinal: Soft and nontender. No distention.  Musculoskeletal: Left upper extremity AV fistula. Patient does have mild to moderate tenderness to palpation of the right ankle and foot. No erythema or swelling. No signs of infection. Neurologic:   Normal speech and language. No gross focal neurologic deficits  Skin:  Skin is warm, dry and intact.  Psychiatric: Mood and affect are normal.   ____________________________________________    EKG  EKG reviewed and interpreted, so shows normal sinus rhythm at 92 bpm, narrow QRS, left axis deviation, slightly prolonged QTC of 528 ms, slightly peaked T waves. Anterolateral T-wave inversions.  ____________________________________________    RADIOLOGY  X-ray of the foot shows no acute changes.  ____________________________________________    INITIAL IMPRESSION / ASSESSMENT AND PLAN / ED COURSE  Pertinent labs & imaging results that were available during my care of the patient were reviewed by me and considered in my medical decision making (see chart for details).  Patient presents the emergency department with right foot pain. More importantly the patient states she cannot get to her dialysis sessions because she is unable to walk downstairs and she does not have a ramp at her house. Patient is scheduled for dialysis today at 10 AM. We will check an x-ray of the patient's foot. We will check labs to evaluate the patient's need for dialysis. Patient does have slightly peaked T waves on her EKG. I have consult to the social worker to get involved to help arrange transportation, etc so the patient can get to dialysis, if she is able to be discharged home.  We have been working with the Education officer, museum, she has been talking to the patient's daughter and they plan to have a ramp installed this week at the home. The patient has a wheelchair so once the rapist installed they will be able to safely remove the patient out of the house to the driveway where her transportation agency can pick her up and take her to dialysis. I discussed the patient with  Dr. Candiss Norse, he has arranged for the patient to have dialysis from the emergency department. Patient will go to dialysis and then return to the emergency  department for discharge. As far as her right foot pain and do not see any acute issues. The patient states it has been ongoing for greater than 2 months. X-ray is negative. Normal white blood cell count on lab work. We will discharge with Ultram as needed for discomfort and follow-up with orthopedics. Patient is agreeable to this plan.  ____________________________________________   FINAL CLINICAL IMPRESSION(S) / ED DIAGNOSES  Right foot pain Missed dialysis Hyperkalemia  Harvest Dark, MD 09/12/15 [phone]

## 2015-09-12 NOTE — Progress Notes (Signed)
LCSW consulted with ED nurse and advised her that patients daughter wanted a call once her Mother is out of dialysis. LCSW was informed pt started after 3:30pm and it will take aprox 4-5 hours.  [employer] LCSW [phone]

## 2015-09-19 ENCOUNTER — Emergency Department: Payer: Medicare Other

## 2015-09-19 ENCOUNTER — Encounter: Payer: Self-pay | Admitting: Emergency Medicine

## 2015-09-19 ENCOUNTER — Emergency Department
Admission: EM | Admit: 2015-09-19 | Discharge: 2015-09-19 | Payer: Medicare Other | Attending: Emergency Medicine | Admitting: Emergency Medicine

## 2015-09-19 DIAGNOSIS — Z992 Dependence on renal dialysis: Secondary | ICD-10-CM | POA: Insufficient documentation

## 2015-09-19 DIAGNOSIS — I132 Hypertensive heart and chronic kidney disease with heart failure and with stage 5 chronic kidney disease, or end stage renal disease: Secondary | ICD-10-CM | POA: Diagnosis not present

## 2015-09-19 DIAGNOSIS — Z87891 Personal history of nicotine dependence: Secondary | ICD-10-CM | POA: Insufficient documentation

## 2015-09-19 DIAGNOSIS — I4891 Unspecified atrial fibrillation: Secondary | ICD-10-CM | POA: Diagnosis not present

## 2015-09-19 DIAGNOSIS — I5032 Chronic diastolic (congestive) heart failure: Secondary | ICD-10-CM | POA: Diagnosis not present

## 2015-09-19 DIAGNOSIS — N186 End stage renal disease: Secondary | ICD-10-CM | POA: Diagnosis not present

## 2015-09-19 DIAGNOSIS — I2510 Atherosclerotic heart disease of native coronary artery without angina pectoris: Secondary | ICD-10-CM | POA: Diagnosis not present

## 2015-09-19 DIAGNOSIS — M79605 Pain in left leg: Secondary | ICD-10-CM | POA: Diagnosis present

## 2015-09-19 LAB — CBC WITH DIFFERENTIAL/PLATELET
Basophils Absolute: 0.1 10*3/uL (ref 0–0.1)
Basophils Relative: 1 %
Eosinophils Absolute: 0.1 10*3/uL (ref 0–0.7)
Eosinophils Relative: 1 %
HEMATOCRIT: 30.3 % — AB (ref 35.0–47.0)
HEMOGLOBIN: 9.8 g/dL — AB (ref 12.0–16.0)
LYMPHS ABS: 0.4 10*3/uL — AB (ref 1.0–3.6)
Lymphocytes Relative: 5 %
MCH: 29.6 pg (ref 26.0–34.0)
MCHC: 32.2 g/dL (ref 32.0–36.0)
MCV: 92.2 fL (ref 80.0–100.0)
Monocytes Absolute: 0.3 10*3/uL (ref 0.2–0.9)
NEUTROS ABS: 6.5 10*3/uL (ref 1.4–6.5)
Platelets: 115 10*3/uL — ABNORMAL LOW (ref 150–440)
RBC: 3.29 MIL/uL — ABNORMAL LOW (ref 3.80–5.20)
RDW: 19.2 % — ABNORMAL HIGH (ref 11.5–14.5)
WBC: 7.3 10*3/uL (ref 3.6–11.0)

## 2015-09-19 LAB — COMPREHENSIVE METABOLIC PANEL
ALT: 19 U/L (ref 14–54)
ANION GAP: 16 — AB (ref 5–15)
AST: 23 U/L (ref 15–41)
Albumin: 3.6 g/dL (ref 3.5–5.0)
Alkaline Phosphatase: 243 U/L — ABNORMAL HIGH (ref 38–126)
BUN: 102 mg/dL — ABNORMAL HIGH (ref 6–20)
CHLORIDE: 103 mmol/L (ref 101–111)
CO2: 19 mmol/L — AB (ref 22–32)
CREATININE: 9.58 mg/dL — AB (ref 0.44–1.00)
Calcium: 9.6 mg/dL (ref 8.9–10.3)
GFR calc non Af Amer: 4 mL/min — ABNORMAL LOW (ref >60)
GFR, EST AFRICAN AMERICAN: 4 mL/min — AB (ref >60)
Glucose, Bld: 89 mg/dL (ref 65–99)
Potassium: 6.5 mmol/L — ABNORMAL HIGH (ref 3.5–5.1)
SODIUM: 138 mmol/L (ref 135–145)
Total Bilirubin: 1 mg/dL (ref 0.3–1.2)
Total Protein: 7.1 g/dL (ref 6.5–8.1)

## 2015-09-19 LAB — URINALYSIS COMPLETE WITH MICROSCOPIC (ARMC ONLY)
BACTERIA UA: NONE SEEN
Bilirubin Urine: NEGATIVE
Glucose, UA: >500 mg/dL — AB
Hgb urine dipstick: NEGATIVE
Ketones, ur: NEGATIVE mg/dL
Leukocytes, UA: NEGATIVE
Nitrite: NEGATIVE
SPECIFIC GRAVITY, URINE: 1.011 (ref 1.005–1.030)
Squamous Epithelial / LPF: NONE SEEN
pH: 8 (ref 5.0–8.0)

## 2015-09-19 LAB — CK: CK TOTAL: 121 U/L (ref 38–234)

## 2015-09-19 MED ORDER — TROLAMINE SALICYLATE 10 % EX CREA
TOPICAL_CREAM | Freq: Two times a day (BID) | CUTANEOUS | Status: AC | PRN
Start: 2015-09-19 — End: ?

## 2015-09-19 MED ORDER — CINACALCET HCL 30 MG PO TABS: 60.0000 mg | ORAL_TABLET | Freq: Every day | ORAL | Status: DC

## 2015-09-19 MED ORDER — HYDRALAZINE HCL 25 MG PO TABS: 25.0000 mg | ORAL_TABLET | Freq: Three times a day (TID) | ORAL | Status: DC

## 2015-09-19 MED ORDER — ATORVASTATIN CALCIUM 40 MG PO TABS: 40.0000 mg | ORAL_TABLET | Freq: Every day | ORAL | Status: AC

## 2015-09-19 MED ORDER — AMLODIPINE BESYLATE 10 MG PO TABS: 10.0000 mg | ORAL_TABLET | Freq: Every day | ORAL | Status: DC

## 2015-09-19 MED ORDER — PANTOPRAZOLE SODIUM 40 MG PO TBEC: 40.0000 mg | DELAYED_RELEASE_TABLET | Freq: Two times a day (BID) | ORAL | Status: AC

## 2015-09-19 MED ORDER — CARVEDILOL 6.25 MG PO TABS: 6.2500 mg | ORAL_TABLET | Freq: Two times a day (BID) | ORAL | Status: DC

## 2015-09-19 MED ORDER — PREGABALIN 25 MG PO CAPS: 25.0000 mg | ORAL_CAPSULE | Freq: Two times a day (BID) | ORAL | Status: DC

## 2015-09-19 MED ORDER — CALCIUM ACETATE (PHOS BINDER) 667 MG/5ML PO SOLN: 1334.0000 mg | Freq: Three times a day (TID) | ORAL | Status: DC

## 2015-09-19 MED ORDER — LOSARTAN POTASSIUM 100 MG PO TABS: 100.0000 mg | ORAL_TABLET | Freq: Every day | ORAL | Status: DC

## 2015-09-19 MED ORDER — POLYETHYLENE GLYCOL 3350 17 G PO PACK: 17.0000 g | PACK | Freq: Every day | ORAL | Status: AC | PRN

## 2015-09-19 MED ORDER — ENSURE COMPLETE PO LIQD: 237.0000 mL | Freq: Three times a day (TID) | ORAL | Status: DC

## 2015-09-19 NOTE — ED Notes (Signed)
Spoke with Elta Guadeloupe at [place] dialysis and told him about pt's BP being 174/92, per Elta Guadeloupe pt is okay to come to Dialysis.

## 2015-09-19 NOTE — ED Notes (Signed)
Patient transported to X-ray 

## 2015-09-19 NOTE — ED Notes (Signed)
Report given to [place], [place]. [phone].
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
## 2015-09-19 NOTE — ED Notes (Signed)
[place], CSW to bedside at this time.

## 2015-09-19 NOTE — ED Provider Notes (Signed)
[facility] Emergency Department Provider Note  ____________________________________________  Time seen: 9:50 AM  I have reviewed the triage vital signs and the nursing notes.   HISTORY  Chief Complaint Leg Pain    HPI Toni Carlson is a 70 y.o. female comes the ED complaining of leg pain. She reports that she is having trouble at home because they live in a mobile home with steps, and she is wheelchair bound and oxygen dependent. She needs EMS to transport her to dialysis but it is difficult without a ramp from the mobile home. Also reports she does not get enough assistance from her daughter who lives with her. Daughter works 12 hour shifts during the day. Last night the patient fell and EMS came to help her up off the ground, she refused transferred at that time, but this morning when EMS went to take her to dialysis she requested to be transported to the hospital instead. She denies chest pain or shortness of breath. No fever or vomiting or diarrhea. She requests to be placed at [facility] for [place]-term care.     Past Medical History  Diagnosis Date  . Hypertension   . ESRD (end stage renal disease) ([place])     On Tue-Thur-Sat dialysis  . Chronic diastolic CHF (congestive heart failure) ([place])     a. echo 03/2015: EF 60-65%, normal wall motion, diastolic parameters were c/w restrictive physiology, indicative of decreased LV diastolic compliance and/or increased LA pressure, mild AS, mild MR, left atrium was severely dilated, RA was severely dilated, PASP was severely increased at 90 mm Hg  . Anemia   . Hyperparathyroidism, secondary renal ([place])   . Coronary artery disease, non-occlusive     a. cardiac cath 2013  . H/O ischemic right MCA stroke     a. 03/2015; b. residual left-sided facial droop and slurred speech  . A-fib (HCC)     a. on warfarin  . Pulmonary HTN ([place])     as above  . Symptomatic bradycardia     a. history of symptomatic  bradycardia; b. felt to be 2/2 metabolic abnormalities & required temp wire but no PPM, resolved with HD  . Carotid artery stenosis     a. ultrasound 03/2015: RICA 0000000 stenosis, LICA less than [id number] stenosis     Patient Active Problem List   Diagnosis Date Noted  . Anemia 07/15/2015  . GI bleed 07/15/2015  . Polyneuropathy ([place]) 07/08/2015  . Degenerative disc disease, lumbar 07/08/2015  . Pancytopenia ([place]) 07/08/2015  . Generalized weakness 07/08/2015  . End stage renal disease ([place]) 07/08/2015  . Atherosclerotic peripheral vascular disease ([place]) 07/08/2015  . Pain in both feet 07/02/2015  . Gastric ulceration   . Acute GI bleeding   . GIB (gastrointestinal bleeding) 04/12/2015  . Left-sided weakness 04/01/2015  . Essential hypertension, malignant 04/01/2015  . Anemia of chronic disease 04/01/2015  . Elevated troponin 04/01/2015  . Pulmonary hypertension ([place]) 04/01/2015  . Atrial fibrillation ([place])   . CVA (cerebral infarction) 03/26/2015     Past Surgical History  Procedure Laterality Date  . Peripheral vascular catheterization N/A 02/23/2015    Procedure: A/V Shuntogram/Fistulagram;  Surgeon: Algernon Huxley, MD;  Location: [place] [facility];  Service: Cardiovascular;  Laterality: N/A;  . Peripheral vascular catheterization N/A 02/23/2015    Procedure: A/V Shunt Intervention;  Surgeon: Algernon Huxley, MD;  Location: [place] [facility];  Service: Cardiovascular;  Laterality: N/A;  . Esophagogastroduodenoscopy (egd) with propofol N/A  04/14/2015    Procedure: ESOPHAGOGASTRODUODENOSCOPY (EGD) WITH PROPOFOL;  Surgeon: Lucilla Lame, MD;  Location: [facility] ENDOSCOPY;  Service: Endoscopy;  Laterality: N/A;  . Esophagogastroduodenoscopy (egd) with propofol N/A 07/17/2015    Procedure: ESOPHAGOGASTRODUODENOSCOPY (EGD) WITH PROPOFOL;  Surgeon: Josefine Class, MD;  Location: [facility] ENDOSCOPY;  Service: Endoscopy;  Laterality: N/A;     Current Outpatient Rx  Name  Route  Sig  Dispense   Refill  . amLODipine (NORVASC) 10 MG tablet   Oral   Take 1 tablet (10 mg total) by mouth daily.   30 tablet   0   . atorvastatin (LIPITOR) 40 MG tablet   Oral   Take 1 tablet (40 mg total) by mouth daily.   30 tablet   0   . calcium acetate, Phos Binder, (PHOSLYRA) 667 MG/5ML SOLN   Oral   Take 10 mLs (1,334 mg total) by mouth 3 (three) times daily with meals.   450 mL   1   . carvedilol (COREG) 6.25 MG tablet   Oral   Take 1 tablet (6.25 mg total) by mouth 2 (two) times daily with a meal.   60 tablet   0   . cinacalcet (SENSIPAR) 30 MG tablet   Oral   Take 2 tablets (60 mg total) by mouth daily.   60 tablet   0   . feeding supplement, ENSURE COMPLETE, (ENSURE COMPLETE) LIQD   Oral   Take 237 mLs by mouth 3 (three) times daily with meals.   90 Bottle   6   . hydrALAZINE (APRESOLINE) 25 MG tablet   Oral   Take 1 tablet (25 mg total) by mouth 3 (three) times daily.   90 tablet   0   . losartan (COZAAR) 100 MG tablet   Oral   Take 1 tablet (100 mg total) by mouth daily.   30 tablet   0   . pantoprazole (PROTONIX) 40 MG tablet   Oral   Take 1 tablet (40 mg total) by mouth 2 (two) times daily.   60 tablet   0   . polyethylene glycol (MIRALAX / GLYCOLAX) packet   Oral   Take 17 g by mouth daily as needed for mild constipation.   14 each   0   . pregabalin (LYRICA) 25 MG capsule   Oral   Take 1 capsule (25 mg total) by mouth 2 (two) times daily.   60 capsule   0   . trolamine salicylate (ASPERCREME) 10 % cream   Topical   Apply topically 2 (two) times daily as needed for muscle pain.   85 g   0      Allergies Review of patient's allergies indicates no known allergies.   Family History  Problem Relation Age of Onset  . CAD Father   . Dementia Mother     Social History Social History  Substance Use Topics  . Smoking status: Former Smoker -- 0.25 packs/day for 30 years    Types: Cigarettes  . Smokeless tobacco: Never Used  . Alcohol  Use: No    Review of Systems  Constitutional:   No fever or chills.  Eyes:   No vision changes.  ENT:   No sore throat. No rhinorrhea. Cardiovascular:   No chest pain. Respiratory:   No dyspnea or cough. Gastrointestinal:   Negative for abdominal pain, vomiting and diarrhea.  Genitourinary:   Negative for dysuria or difficulty urinating.She still makes urine Musculoskeletal:   Left leg pain  Neurological:   Negative for headaches 10-point ROS otherwise negative.  ____________________________________________   PHYSICAL EXAM:  VITAL SIGNS: ED Triage Vitals  Enc Vitals Group     BP 09/19/15 1014 158/94 mmHg     Pulse Rate 09/19/15 1014 76     Resp 09/19/15 1014 18     Temp 09/19/15 1014 97.1 F (36.2 C)     Temp Source 09/19/15 1014 Axillary     SpO2 09/19/15 1008 94 %     Weight 09/19/15 1014 150 lb (68.04 kg)     Height 09/19/15 1014 5\' 6"  (1.676 m)     Head Cir --      Peak Flow --      Pain Score 09/19/15 1014 8     Pain Loc --      Pain Edu? --      Excl. in GC? --   Oxygen saturation 91% on room air, 94% on chronic 2 L nasal cannula  Vital signs reviewed, nursing assessments reviewed.   Constitutional:   Alert and oriented. Well appearing and in no distress. Eyes:   No scleral icterus. No conjunctival pallor. PERRL. EOMI.  No nystagmus. ENT   Head:   Normocephalic and atraumatic.   Nose:   No congestion/rhinnorhea. No septal hematoma   Mouth/Throat:   MMM, no pharyngeal erythema. No peritonsillar mass.    Neck:   No stridor. No SubQ emphysema. No meningismus. Hematological/Lymphatic/Immunilogical:   No cervical lymphadenopathy. Cardiovascular:   RRR. Symmetric bilateral radial and DP pulses.  No murmurs.  Respiratory:   Normal respiratory effort without tachypnea nor retractions. Breath sounds are clear and equal bilaterally. No wheezes/rales/rhonchi. Gastrointestinal:   Soft and nontender. Non distended. There is no CVA tenderness.  No rebound,  rigidity, or guarding. Genitourinary:   deferred Musculoskeletal:   Mild tenderness along the left femur. Normal range of motion, no deformity. No hip tenderness, normal range of motion of the hip. No edema or asymmetry. No calf tenderness, negative Homans sign.. Neurologic:   Normal speech and language.  CN 2-10 normal. Motor grossly intact. No gross focal neurologic deficits are appreciated.  Skin:    Skin is warm, dry and intact. No rash noted.  No petechiae, purpura, or bullae.  ____________________________________________    LABS (pertinent positives/negatives) (all labs ordered are listed, but only abnormal results are displayed) Labs Reviewed  COMPREHENSIVE METABOLIC PANEL - Abnormal; Notable for the following:    Potassium 6.5 (*)    CO2 19 (*)    BUN 102 (*)    Creatinine, Ser 9.58 (*)    Alkaline Phosphatase 243 (*)    GFR calc non Af Amer 4 (*)    GFR calc Af Amer 4 (*)    Anion gap 16 (*)    All other components within normal limits  CBC WITH DIFFERENTIAL/PLATELET - Abnormal; Notable for the following:    RBC 3.29 (*)    Hemoglobin 9.8 (*)    HCT 30.3 (*)    RDW 19.2 (*)    Platelets 115 (*)    Lymphs Abs 0.4 (*)    All other components within normal limits  URINE CULTURE  CK  URINALYSIS COMPLETEWITH MICROSCOPIC ([facility] ONLY)   ____________________________________________   EKG  Interpreted by me Atrial fibrillation rate of 58, left axis, normal intervals. Normal QRS and ST segments. T wave inversions in the lateral leads, unchanged from 09/12/2015  ____________________________________________    RADIOLOGY  Chest x-ray shows mild interstitial edema, no acute changes.  X-ray left femur unremarkable X-ray pelvis shows old fracture of the medial left ischium, no acute changes. ____________________________________________   PROCEDURES   ____________________________________________   INITIAL IMPRESSION / ASSESSMENT AND PLAN / ED COURSE  Pertinent  labs & imaging results that were available during my care of the patient were reviewed by me and considered in my medical decision making (see chart for details).  Patient presents with leg pain, x-rays show chronic findings but nothing acute. Exam is reassuring, low suspicion for DVT or soft tissue infection. Compartment soft. She also wishes to be placed in [facility]-term care facility. Labs today only show a potassium of 6.5. Last dialysis was 4 days ago, and today being Saturday, she would likely not be able to be dialyzed until Monday if she doesn't have it today. She was advised by social work who got her accepted to [facility] and she has a room assignment there. We also discussed with [facility] who is able to work the patient in today. We'll have her transported by EMS to [facility], and from there she can be returned to [facility]. Not in distress, no peak T waves or other serious EKG changes.     ____________________________________________   FINAL CLINICAL IMPRESSION(S) / ED DIAGNOSES  Final diagnoses:  Left leg pain  End-stage renal disease on hemodialysis ([place])  Hyperkalemia     Portions of this note were generated with dragon dictation software. Dictation errors may occur despite best attempts at proofreading.   Carrie Mew, MD 09/19/15 1308
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
## 2015-09-19 NOTE — Progress Notes (Signed)
LCSW met with patient and completed assessment and Fl2 and spoke to doctor [place]. Patient has been accepted by [facility] and was given room # 33d Call report number is [phone] ( This patient has been pre-approved by Director Molli Barrows ) All documentation has been sent out via the hub for patients admission to [facility].  LCSW called family and they Child psychotherapist ) pleased with this plan and hopes this will assist her mother further.  [employer] LCSW [phone]

## 2015-09-19 NOTE — Discharge Instructions (Signed)
Your potassium today is 6.5. We have called [facility] and they'll be able to work you in today. From there, you can be transported to [facility].  End-Stage Kidney Disease The kidneys are two organs that lie on either side of the spine between the middle of the back and the front of the abdomen. The kidneys:   Remove wastes and extra water from the blood.   Produce important hormones. These help keep bones strong, regulate blood pressure, and help create red blood cells.   Balance the fluids and chemicals in the blood and tissues. End-stage kidney disease occurs when the kidneys are so damaged that they cannot do their job. When the kidneys cannot do their job, life-threatening problems occur. The body cannot stay clean and strong without the help of the kidneys. In end-stage kidney disease, the kidneys cannot get better.You need a new kidney or treatments to do some of the work healthy kidneys do in order to stay alive. CAUSES  End-stage kidney disease usually occurs when a [place]-lasting (chronic) kidney disease gets worse. It may also occur after the kidneys are suddenly damaged (acute kidney injury).  SYMPTOMS   Swelling (edema) of the legs, ankles, or feet.   Tiredness (lethargy).   Nausea or vomiting.   Confusion.   Problems with urination, such as:   Decreased urine production.   Frequent urination, especially at night.   Frequent accidents in children who are potty trained.   Muscle twitches and cramps.   Persistent itchiness.   Loss of appetite.   Headaches.   Abnormally dark or light skin.   Numbness in the hands or feet.   Easy bruising.   Frequent hiccups.   Menstruation stops. DIAGNOSIS  Your health care provider will measure your blood pressure and take some tests. These may include:   Urine tests.   Blood tests.   Imaging tests, such as:   An ultrasound exam.   Computed tomography (CT).  A kidney  biopsy. TREATMENT  There are two treatments for end-stage kidney disease:   A procedure that removes toxic wastes from the body (dialysis).   Receiving a new kidney (kidney transplant). Both of these treatments have serious risks and consequences. Your health care provider will help you determine which treatment is best for you based on your health, age, and other factors. In addition to having dialysis or a kidney transplant, you may need to take medicines to control high blood pressure (hypertension) and cholesterol and to decrease phosphorus levels in your blood.  HOME CARE INSTRUCTIONS  Follow your prescribed diet.   Take medicines only as directed by your health care provider.   Do not take any new medicines (prescription, over-the-counter, or nutritional supplements) unless approved by your health care provider. Many medicines can worsen your kidney damage or need to have the dose adjusted.   Keep all follow-up visits as directed by your health care provider. MAKE SURE YOU:  Understand these instructions.  Will watch your condition.  Will get help right away if you are not doing well or get worse.   This information is not intended to replace advice given to you by your health care provider. Make sure you discuss any questions you have with your health care provider.   Document Released: 07/23/2003 Document Revised: 05/23/2014 Document Reviewed: 12/30/2011 Elsevier Interactive Patient Education 2016 [employer].  Hemodialysis Hemodialysis is a way of removing wastes, salt, and extra water from your blood. It is done when your kidneys cannot  keep the blood clean. During hemodialysis, your blood travels outside of your body to a machine. A filter in the machine cleans the blood. Hemodialysis is usually done 3 times a week. Visits last 3-5 hours. BEFORE THE PROCEDURE An opening must be made to allow blood to be taken from your body and put back into your body (access). It is  made weeks or months before you start hemodialysis. It is usually made in your arm. If you need hemodialysis right away, a thin, flexible tube (catheter) will be placed in your neck, chest, or groin. This type of access is usually temporary. PROCEDURE Hemodialysis is done while you are sitting or leaning back. During hemodialysis you may do any activity as [place] as you stay sitting or leaning back. Many people sleep, watch television, or read. If you have side effects or feel uncomfortable, tell your doctor. Your doctor may make changes to help you feel more comfortable. Your hemodialysis visits may look like this:  You will be weighed and your temperature will be taken. Your blood pressure and pulse will be checked.  The skin around your access will be cleaned.  Two needles will be put into the access. They will be connected to a plastic tube. The needles will be taped to your skin so that they do not move. If you have a temporary access, your catheter will be connected to a plastic tube.  Your blood will go through the tube to the machine. The machine will clean your blood. Then your blood will go back to your body. Your blood pressure and pulse will be checked a few times.  Once the procedure is complete, the needles will be removed. A bandage (dressing) will be placed over the access. If your access is a catheter, it will be disconnected. AFTER THE PROCEDURE  You will be weighed.  Your blood will be tested. This is usually done once a month.  You may have side effects. These include:  Dizziness.  Muscle cramps.  Feeling sick to your stomach (nausea).  Headaches.  Feeling tired. You usually feel normal the next day.  Itchiness. Your doctor may give medicine to help with this.   Achy or jittery legs. You may feel like kicking your legs. This can cause sleeping problems.   Allergic reaction. MAKE SURE YOU:  Understand these instructions.  Will watch your condition.  Will  get help right away if you are not doing well or get worse.   This information is not intended to replace advice given to you by your health care provider. Make sure you discuss any questions you have with your health care provider.   Document Released: 04/14/2008 Document Revised: 08/27/2012 Document Reviewed: 06/18/2012 Elsevier Interactive Patient Education 2016 Elsevier Inc.  Musculoskeletal Pain Musculoskeletal pain is muscle and boney aches and pains. These pains can occur in any part of the body. Your caregiver may treat you without knowing the cause of the pain. They may treat you if blood or urine tests, X-rays, and other tests were normal.  CAUSES There is often not a definite cause or reason for these pains. These pains may be caused by a type of germ (virus). The discomfort may also come from overuse. Overuse includes working out too hard when your body is not fit. Boney aches also come from weather changes. Bone is sensitive to atmospheric pressure changes. HOME CARE INSTRUCTIONS   Ask when your test results will be ready. Make sure you get your test results.  Only take over-the-counter or prescription medicines for pain, discomfort, or fever as directed by your caregiver. If you were given medications for your condition, do not drive, operate machinery or power tools, or sign legal documents for 24 hours. Do not drink alcohol. Do not take sleeping pills or other medications that may interfere with treatment.  Continue all activities unless the activities cause more pain. When the pain lessens, slowly resume normal activities. Gradually increase the intensity and duration of the activities or exercise.  During periods of severe pain, bed rest may be helpful. Lay or sit in any position that is comfortable.  Putting ice on the injured area.  Put ice in a bag.  Place a towel between your skin and the bag.  Leave the ice on for 15 to 20 minutes, 3 to 4 times a day.  Follow up  with your caregiver for continued problems and no reason can be found for the pain. If the pain becomes worse or does not go away, it may be necessary to repeat tests or do additional testing. Your caregiver may need to look further for a possible cause. SEEK IMMEDIATE MEDICAL CARE IF:  You have pain that is getting worse and is not relieved by medications.  You develop chest pain that is associated with shortness or breath, sweating, feeling sick to your stomach (nauseous), or throw up (vomit).  Your pain becomes localized to the abdomen.  You develop any new symptoms that seem different or that concern you. MAKE SURE YOU:   Understand these instructions.  Will watch your condition.  Will get help right away if you are not doing well or get worse.   This information is not intended to replace advice given to you by your health care provider. Make sure you discuss any questions you have with your health care provider.   Document Released: 05/02/2005 Document Revised: 07/25/2011 Document Reviewed: 01/04/2013 Elsevier Interactive Patient Education [employer].

## 2015-09-19 NOTE — Care Management Note (Signed)
Case Management Note  Patient Details  Name: Toni Carlson MRN: [id number] Date of Birth: Feb 09, 1946  Subjective/Objective:  Patient missed dialysis this AM ready for discharge from the ED. I called [facility] on [address] spoke with Mark,patient  can receive dialysis today. Sherlie Ban has notified EMS they are in route for transport. Dr Joni Fears informed and and agreement with plan as is the patient.                Action/Plan:   Expected Discharge Date:                  Expected Discharge Plan:     In-House Referral:     Discharge planning Services     Post Acute Care Choice:    Choice offered to:     DME Arranged:    DME Agency:     HH Arranged:    [place] Agency:     Status of Service:     Medicare Important Message Given:    Date Medicare IM Given:    Medicare IM give by:    Date Additional Medicare IM Given:    Additional Medicare Important Message give by:     If discussed at [place] of Stay Meetings, dates discussed:    Additional Comments:  Ival Bible, RN 09/19/2015, 1:08 PM

## 2015-09-19 NOTE — Progress Notes (Signed)
LCSW received a call from [facility] and her room number will now be 37B. All documents were faxed directly to [facility] and sent via the hub. ED nurse will forward documents to ED secretary. Patient is to go to [facility] then onto [facility]. Patient is pleased with plan as well as family.  Toni Rowser LCSW [phone]  No further needs

## 2015-09-19 NOTE — ED Notes (Signed)
[facility] made aware of pt's BP, states okay for patient to come to dialysis, however states that they will stop her dialysis at 1530, MD made aware at this time. MD made aware of patient's BP and that per patient did not received her meds last night or today, per MD okay to go to dialysis at this time. Report called to [facility]. Pt transported to [facility] via [facility].
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
## 2015-09-19 NOTE — NC FL2 (Signed)
[place] LEVEL OF CARE SCREENING TOOL     IDENTIFICATION  Patient Name: Toni Carlson Birthdate: 1946-02-27 Sex: female Admission Date (Current Location): 09/19/2015  [place] and [place] Number:  Engineering geologist and Address:  [facility], [address] [place], [place] [address]      Provider Number: [id number]  Attending Physician Name and Address:  Carrie Mew, MD  Relative Name and Phone Number:       Current Level of Care:   Recommended Level of Care: [place] Prior Approval Number:    Date Approved/Denied:   PASRR Number:   [id number]   Discharge Plan: SNF    Current Diagnoses: Patient Active Problem List   Diagnosis Date Noted  . Anemia 07/15/2015  . GI bleed 07/15/2015  . Polyneuropathy ([place]) 07/08/2015  . Degenerative disc disease, lumbar 07/08/2015  . Pancytopenia ([place]) 07/08/2015  . Generalized weakness 07/08/2015  . End stage renal disease ([place]) 07/08/2015  . Atherosclerotic peripheral vascular disease ([place]) 07/08/2015  . Pain in both feet 07/02/2015  . Gastric ulceration   . Acute GI bleeding   . GIB (gastrointestinal bleeding) 04/12/2015  . Left-sided weakness 04/01/2015  . Essential hypertension, malignant 04/01/2015  . Anemia of chronic disease 04/01/2015  . Elevated troponin 04/01/2015  . Pulmonary hypertension ([place]) 04/01/2015  . Atrial fibrillation ([place])   . CVA (cerebral infarction) 03/26/2015    Orientation RESPIRATION BLADDER Height & Weight     Time, Situation, Place, Self  Normal Incontinent Weight: 150 lb (68.04 kg) Height:  5\' 6"  (167.6 cm)  BEHAVIORAL SYMPTOMS/MOOD NEUROLOGICAL BOWEL NUTRITION STATUS      Incontinent Diet (Renal Diet)  AMBULATORY STATUS COMMUNICATION OF NEEDS Skin   Extensive Assist Verbally Normal                       Personal Care Assistance Level of Assistance  Bathing, Feeding, Dressing, Total care Bathing Assistance: Maximum  assistance Feeding assistance: Independent Dressing Assistance: Maximum assistance Total Care Assistance: Maximum assistance   Functional Limitations Info  Sight, Hearing, Speech Sight Info: Adequate Hearing Info: Adequate Speech Info: Adequate    SPECIAL CARE FACTORS FREQUENCY  PT (By licensed PT)     PT Frequency: 5x week              Contractures      Additional Factors Info                  Current Medications (09/19/2015):  This is the current hospital active medication list No current facility-administered medications for this encounter.   Current Outpatient Prescriptions  Medication Sig Dispense Refill  . amLODipine (NORVASC) 10 MG tablet Take 10 mg by mouth daily.    Marland Kitchen atorvastatin (LIPITOR) 40 MG tablet Take 40 mg by mouth daily.    . calcium acetate, Phos Binder, (PHOSLYRA) 667 MG/5ML SOLN Take 1,334 mg by mouth 3 (three) times daily with meals.    . carvedilol (COREG) 6.25 MG tablet Take 6.25 mg by mouth 2 (two) times daily with a meal.    . cinacalcet (SENSIPAR) 30 MG tablet Take 60 mg by mouth daily.    . feeding supplement, ENSURE COMPLETE, (ENSURE COMPLETE) LIQD Take 237 mLs by mouth 3 (three) times daily with meals. (Patient not taking: Reported on 07/15/2015) 90 Bottle 6  . hydrALAZINE (APRESOLINE) 25 MG tablet Take 25 mg by mouth 3 (three) times daily.     Marland Kitchen losartan (COZAAR)  100 MG tablet Take 100 mg by mouth daily.    Marland Kitchen oxyCODONE-acetaminophen (PERCOCET/ROXICET) 5-325 MG tablet Take 1 tablet by mouth every 4 (four) hours as needed for moderate pain. 30 tablet 0  . pantoprazole (PROTONIX) 40 MG tablet Take 1 tablet (40 mg total) by mouth 2 (two) times daily.    . polyethylene glycol (MIRALAX / GLYCOLAX) packet Take 17 g by mouth daily as needed for mild constipation. 14 each 0  . pregabalin (LYRICA) 25 MG capsule Take 1 capsule (25 mg total) by mouth 2 (two) times daily. 60 capsule 5  . traMADol (ULTRAM) 50 MG tablet Take 1 tablet (50 mg total) by  mouth every 6 (six) hours as needed. 20 tablet 0  . trolamine salicylate (ASPERCREME) 10 % cream Apply topically 2 (two) times daily as needed for muscle pain. (Patient not taking: Reported on 07/02/2015) 85 g 0     Discharge Medications: Please see discharge summary for a list of discharge medications.  Relevant Imaging Results:  Relevant Lab Results:   Additional Information [id number]  [name], [place]

## 2015-09-21 LAB — URINE CULTURE: CULTURE: NO GROWTH

## 2015-09-23 ENCOUNTER — Encounter: Payer: Self-pay | Admitting: *Deleted

## 2015-09-24 ENCOUNTER — Ambulatory Visit: Payer: Medicare Other | Admitting: Anesthesiology

## 2015-09-24 ENCOUNTER — Encounter
Admission: RE | Disposition: A | Discharge status: Skilled Nursing Facility | Payer: Self-pay | Source: Ambulatory Visit | Attending: Gastroenterology

## 2015-09-24 ENCOUNTER — Encounter: Payer: Self-pay | Admitting: *Deleted

## 2015-09-24 ENCOUNTER — Ambulatory Visit
Admission: RE | Admit: 2015-09-24 | Discharge: 2015-09-24 | Disposition: A | Discharge status: Skilled Nursing Facility | Payer: Medicare Other | Source: Ambulatory Visit | Attending: Gastroenterology | Admitting: Gastroenterology

## 2015-09-24 DIAGNOSIS — Z87891 Personal history of nicotine dependence: Secondary | ICD-10-CM | POA: Diagnosis not present

## 2015-09-24 DIAGNOSIS — Z8673 Personal history of transient ischemic attack (TIA), and cerebral infarction without residual deficits: Secondary | ICD-10-CM | POA: Insufficient documentation

## 2015-09-24 DIAGNOSIS — E875 Hyperkalemia: Secondary | ICD-10-CM | POA: Diagnosis not present

## 2015-09-24 DIAGNOSIS — J449 Chronic obstructive pulmonary disease, unspecified: Secondary | ICD-10-CM | POA: Diagnosis not present

## 2015-09-24 DIAGNOSIS — I2510 Atherosclerotic heart disease of native coronary artery without angina pectoris: Secondary | ICD-10-CM | POA: Diagnosis not present

## 2015-09-24 DIAGNOSIS — M8580 Other specified disorders of bone density and structure, unspecified site: Secondary | ICD-10-CM | POA: Insufficient documentation

## 2015-09-24 DIAGNOSIS — K317 Polyp of stomach and duodenum: Secondary | ICD-10-CM | POA: Insufficient documentation

## 2015-09-24 DIAGNOSIS — N186 End stage renal disease: Secondary | ICD-10-CM | POA: Diagnosis not present

## 2015-09-24 DIAGNOSIS — K259 Gastric ulcer, unspecified as acute or chronic, without hemorrhage or perforation: Secondary | ICD-10-CM | POA: Diagnosis not present

## 2015-09-24 DIAGNOSIS — Z8711 Personal history of peptic ulcer disease: Secondary | ICD-10-CM | POA: Diagnosis not present

## 2015-09-24 DIAGNOSIS — K2950 Unspecified chronic gastritis without bleeding: Secondary | ICD-10-CM | POA: Diagnosis not present

## 2015-09-24 DIAGNOSIS — I5032 Chronic diastolic (congestive) heart failure: Secondary | ICD-10-CM | POA: Diagnosis not present

## 2015-09-24 DIAGNOSIS — K319 Disease of stomach and duodenum, unspecified: Secondary | ICD-10-CM | POA: Insufficient documentation

## 2015-09-24 DIAGNOSIS — I132 Hypertensive heart and chronic kidney disease with heart failure and with stage 5 chronic kidney disease, or end stage renal disease: Secondary | ICD-10-CM | POA: Insufficient documentation

## 2015-09-24 DIAGNOSIS — Z992 Dependence on renal dialysis: Secondary | ICD-10-CM | POA: Diagnosis not present

## 2015-09-24 HISTORY — PX: EUS: SHX5427

## 2015-09-24 HISTORY — DX: Nicotine dependence, unspecified, uncomplicated: F17.200

## 2015-09-24 SURGERY — UPPER ENDOSCOPIC ULTRASOUND (EUS) LINEAR
Anesthesia: General

## 2015-09-24 MED ORDER — PHENYLEPHRINE HCL 10 MG/ML IJ SOLN
INTRAMUSCULAR | Status: DC | PRN
  Administered 2015-09-24 (×2): 100 ug via INTRAVENOUS

## 2015-09-24 MED ORDER — SODIUM CHLORIDE 0.9 % IV SOLN
INTRAVENOUS | Status: DC
  Administered 2015-09-24: 1000 mL via INTRAVENOUS

## 2015-09-24 MED ORDER — PROPOFOL 500 MG/50ML IV EMUL
INTRAVENOUS | Status: DC | PRN
  Administered 2015-09-24: 100 ug/kg/min via INTRAVENOUS

## 2015-09-24 MED ORDER — FENTANYL CITRATE (PF) 100 MCG/2ML IJ SOLN
INTRAMUSCULAR | Status: DC | PRN
  Administered 2015-09-24: 50 ug via INTRAVENOUS

## 2015-09-24 MED ORDER — PROPOFOL 10 MG/ML IV BOLUS
INTRAVENOUS | Status: DC | PRN
  Administered 2015-09-24 (×2): 30 mg via INTRAVENOUS

## 2015-09-24 MED ORDER — LIDOCAINE HCL (CARDIAC) 20 MG/ML IV SOLN
INTRAVENOUS | Status: DC | PRN
  Administered 2015-09-24: 60 mg via INTRAVENOUS

## 2015-09-24 NOTE — H&P (View-Only) (Signed)
  Subjective:  Patient presented to ER for rt foot pain She has unable to go to outpatient dialysis. Transport van cannot pick her up as she does not have a ramp at her home for wheelchair transport.    Objective:  Vital signs in last 24 hours:  Temp:  [98 F (36.7 C)] 98 F (36.7 C) (04/29 0927) Pulse Rate:  [52-104] 52 (04/29 1330) Resp:  [16-21] 16 (04/29 1330) BP: (113-156)/(57-87) 113/57 mmHg (04/29 1330) SpO2:  [96 %-99 %] 99 % (04/29 1330) Weight:  [73.347 kg (161 lb 11.2 oz)] 73.347 kg (161 lb 11.2 oz) (04/29 0927)  Weight change:  Filed Weights   09/12/15 0927  Weight: 73.347 kg (161 lb 11.2 oz)    Intake/Output:   No intake or output data in the 24 hours ending 09/12/15 1346   Physical Exam: General: NAD, laying in bed  HEENT Anicteric, moist mucus membranes  Neck supple  Pulm/lungs Clear b/l  CVS/Heart No rub, S 4 gallop  Abdomen:  Soft, NT,   Extremities: Trace peripheral   Neurologic: Alert, oriented  Skin: erytherma over rt foot (chronic)  Access: AVG       Basic Metabolic Panel:   Recent Labs Lab 09/12/15 0945  NA 141  K 5.3*  CL 103  CO2 21*  GLUCOSE 99  BUN 92*  CREATININE 8.41*  CALCIUM 8.5*     CBC:  Recent Labs Lab 09/12/15 0945  WBC 4.7  HGB 10.4*  HCT 32.7*  MCV 93.3  PLT 124*      Microbiology:  No results found for this or any previous visit (from the past 720 hour(s)).  Coagulation Studies: No results for input(s): LABPROT, INR in the last 72 hours.  Urinalysis: No results for input(s): COLORURINE, LABSPEC, PHURINE, GLUCOSEU, HGBUR, BILIRUBINUR, KETONESUR, PROTEINUR, UROBILINOGEN, NITRITE, LEUKOCYTESUR in the last 72 hours.  Invalid input(s): APPERANCEUR    Imaging: Dg Foot Complete Right  09/12/2015  CLINICAL DATA:  right foot pain x 1 month, reports fall and recent CVA. Dialysis pt. EXAM: RIGHT FOOT COMPLETE - 3+ VIEW COMPARISON:  None. FINDINGS: Severe diffuse osteopenia. Small vessel calcification. No  fracture or dislocation. IMPRESSION: Negative for acute abnormality although severe osteopenia lowers sensitivity for potential focal abnormalities. Electronically Signed   By: Skipper Cliche M.D.   On: 09/12/2015 09:57     Medications:       Assessment/ Plan:  70 y.o. female with ESRD, on hemodialysis, GERD, anemia, atrial fibrillation, CVA, coronary artery disease, pulmonary hypertension, carotid artery stenosis    [place]  1. Uremia and hyperkalemia with ESRD and dialysis status  - urgent HD today as patient has had not dialyzed since Tuesday. (missed Thursday)  - orders prepared and care coordinated with ER ; dialysis staff alerted  Patient's daughter is working with a Training and development officer to get a ramp bulit at her house. This will allow [facility] transportation to be able to transport her to the hospital    LOS:  [place] 4/29/20171:46 PM

## 2015-09-24 NOTE — Op Note (Signed)
[facility] Patient Name: Toni Carlson Procedure Date: 09/24/2015 1:01 PM MRN: [id number] Account #: [id number] Date of Birth: [birth year] Admit Type: Outpatient Age: 70 Room: [facility] ENDO ROOM 3 Gender: Female Note Status: Finalized Procedure:            Upper EUS Indications:          Gastric mucosal mass/polyp found on endoscopy Providers:            Zada Girt Referring MD:         Mikeal Hawthorne. Brynda Greathouse, MD (Referring MD), Gerrit Heck. Rayann Heman, MD                        (Referring MD) Medicines:            Monitored Anesthesia Care Complications:        No immediate complications. Procedure:            Pre-Anesthesia Assessment:                       - Please see pre-anesthesia assessment documentation                        already completed in Epic.                       After obtaining informed consent, the endoscope was                        passed under direct vision. Throughout the procedure,                        the patient's blood pressure, pulse, and oxygen                        saturations were monitored continuously. The EUS GI                        Radial Array [id number] was introduced through the mouth,                        and advanced to the duodenum for ultrasound examination                        from the esophagus, stomach and duodenum. The endoscope                        was introduced through the mouth, and advanced to the                        second part of duodenum. The upper EUS was accomplished                        without difficulty. The patient tolerated the procedure                        well. Findings:      Endoscopic Finding :      The examined esophagus was endoscopically normal.      A single 20 to 30 mm sessile polyp/thickened folds with no bleeding and       no stigmata of  recent bleeding was found in the gastric antrum. Small       superficial erosion noted on the surface. Biopsies were taken with a   cold forceps for histology.      The examined duodenum was endoscopically normal.      Endosonographic Finding :      Localized wall thickening was visualized endosonographically in the       antrum of the stomach. This appeared to primarily be due to thickening       within the intramural wall, but the wall layer could not be determined.       Redundant folds noted in this area. No focal mass and no focal       abnormality in the submucosa or muscularis propria. The gastric wall       measured up to 15 mm in thickness although unable to distend the antrum       adequately with water.      No abnormal-appearing lymph nodes were seen during endosonographic       examination in the celiac region (level 20) and in the perigastric       region.      Endosonographic imaging in the visualized portion of the liver showed no       lesion. Impression:           - Normal esophagus.                       - A single gastric polypoid appearing lesion associated                        with prominent folds. Biopsied. No definite evidence                        that this is the cause of blood loss although a                        surperficial erosion was noted.                       - Normal examined duodenum.                       EUS:                       - Wall thickening was seen in the antrum of the                        stomach. The thickening appeared to be primarily within                        the intramural wall, but the wall layer could not be                        determined. No defined polypoid lesion seen on EUS and                        no focal mass seen.                       If biopsies show no concerning abnormality, and if no  evidence for ongoing GI bleeding, would recommend                        repeat EUS in 6 months. Recommendation:       - Await path results.                       - Discharge patient to a nursing home.                       -  Return to referring physician.                       - If biopsies show no concerning abnormality, and if no                        evidence for ongoing GI bleeding, would recommend                        repeat EUS in 6 months.                       - If definite ongoing GI blood loss, without another                        source, recommend patient be seen in clinic to discuss                        pros/cons of removing some of this abnormal area.                       - The findings and recommendations were discussed with                        the patient. Procedure Code(s):    --- Professional ---                       [id number], Esophagogastroduodenoscopy, flexible, transoral;                        with endoscopic ultrasound examination limited to the                        esophagus, stomach or duodenum, and adjacent structures                       [id number], 59, Esophagogastroduodenoscopy, flexible,                        transoral; with biopsy, single or multiple Diagnosis Code(s):    --- Professional ---                       K31.89, Other diseases of stomach and duodenum                       K31.7, Polyp of stomach and duodenum CPT copyright 2016 American Medical Association. All rights reserved. The codes documented in this report are preliminary and upon coder review may  be revised to meet current compliance requirements. Zada Girt,  09/24/2015 2:05:57 PM This report has been signed electronically. Number  of Addenda: 0 Note Initiated On: 09/24/2015 1:01 PM      [facility]

## 2015-09-24 NOTE — Transfer of Care (Signed)
Immediate Anesthesia Transfer of Care Note  Patient: Toni Carlson  Procedure(s) Performed: Procedure(s): UPPER ENDOSCOPIC ULTRASOUND (EUS) LINEAR (N/A)  Patient Location: PACU  Anesthesia Type:General  Level of Consciousness: awake and patient cooperative  Airway & Oxygen Therapy: Patient Spontanous Breathing and Patient connected to face mask oxygen  Post-op Assessment: Report given to RN  Post vital signs: Reviewed and stable  Last Vitals:  Filed Vitals:   09/24/15 1243 09/24/15 1345  BP: 162/66 108/45  Pulse:  71  Temp:  36.3 C  Resp: 20 23    Last Pain: There were no vitals filed for this visit.       Complications: No apparent anesthesia complications

## 2015-09-24 NOTE — Anesthesia Preprocedure Evaluation (Signed)
Anesthesia Evaluation  Patient identified by MRN, date of birth, ID band Patient awake    Reviewed: Allergy & Precautions, H&P , NPO status , Patient's Chart, lab work & pertinent test results  History of Anesthesia Complications Negative for: history of anesthetic complications  Airway Mallampati: III  TM Distance: >3 FB Neck ROM: full    Dental  (+) Poor Dentition, Chipped, Missing, Upper Dentures   Pulmonary shortness of breath and with exertion, COPD, former smoker,    Pulmonary exam normal breath sounds clear to auscultation       Cardiovascular Exercise Tolerance: Poor hypertension, (-) angina+ CAD, + Peripheral Vascular Disease, +CHF and + DOE  (-) Past MI Normal cardiovascular exam Rhythm:regular Rate:Normal     Neuro/Psych neg Seizures  Neuromuscular disease CVA, Residual Symptoms negative psych ROS   GI/Hepatic Neg liver ROS, PUD,   Endo/Other  negative endocrine ROS  Renal/GU ESRF and DialysisRenal disease  negative genitourinary   Musculoskeletal   Abdominal   Peds  Hematology negative hematology ROS (+) Blood dyscrasia, anemia ,   Anesthesia Other Findings Past Medical History:   Hypertension                                                 ESRD (end stage renal disease) (HCC)                           Comment:On Tue-Thur-Sat dialysis   Chronic diastolic CHF (congestive heart failur*                Comment:a. echo 03/2015: EF 60-65%, normal wall motion,              diastolic parameters were c/w restrictive               physiology, indicative of decreased LV               diastolic compliance and/or increased LA               pressure, mild AS, mild MR, left atrium was               severely dilated, RA was severely dilated, PASP              was severely increased at 90 mm Hg   Anemia                                                       Hyperparathyroidism, secondary renal (HCC)                   Coronary artery disease, non-occlusive                         Comment:a. cardiac cath 2013   H/O ischemic right MCA stroke                                  Comment:a. 03/2015; b. residual left-sided facial droop              and slurred speech  A-fib ([place])                                                    Comment:a. on warfarin   Pulmonary HTN ([place])                                            Comment:as above   Symptomatic bradycardia                                        Comment:a. history of symptomatic bradycardia; b. felt               to be 2/2 metabolic abnormalities & required               temp wire but no PPM, resolved with HD   Carotid artery stenosis                                        Comment:a. ultrasound 03/2015: RICA 50-69% stenosis,               LICA less than [id number] stenosis  Past Surgical History:   PERIPHERAL VASCULAR CATHETERIZATION             N/A 02/23/2015     Comment:Procedure: A/V Shuntogram/Fistulagram;                Surgeon: Algernon Huxley, MD;  Location: [place] [facility];  Service: Cardiovascular;                Laterality: N/A;   PERIPHERAL VASCULAR CATHETERIZATION             N/A 02/23/2015     Comment:Procedure: A/V Shunt Intervention;  Surgeon:               Algernon Huxley, MD;  Location: [place] [facility]               LAB;  Service: Cardiovascular;  Laterality:               N/A;   ESOPHAGOGASTRODUODENOSCOPY (EGD) WITH PROPOFOL  N/A 04/14/2015     Comment:Procedure: ESOPHAGOGASTRODUODENOSCOPY (EGD)               WITH PROPOFOL;  Surgeon: Lucilla Lame, MD;                Location: [facility] ENDOSCOPY;  Service: Endoscopy;               Laterality: N/A;  BMI    Body Mass Index   24.67 kg/m 2      Reproductive/Obstetrics negative OB ROS                             Anesthesia Physical  Anesthesia Plan  ASA: IV  Anesthesia Plan: General   Post-op Pain Management:    Induction:   Airway Management  Planned:   Additional Equipment:  Intra-op Plan:   Post-operative Plan:   Informed Consent: I have reviewed the patients History and Physical, chart, labs and discussed the procedure including the risks, benefits and alternatives for the proposed anesthesia with the patient or authorized representative who has indicated his/her understanding and acceptance.   Dental Advisory Given  Plan Discussed with: Anesthesiologist, CRNA and Surgeon  Anesthesia Plan Comments:         Anesthesia Quick Evaluation

## 2015-09-24 NOTE — Interval H&P Note (Signed)
History and Physical Interval Note:  09/24/2015 12:56 PM  Toni Carlson  has presented today for surgery, with the diagnosis of GASTRIC MASS  The various methods of treatment have been discussed with the patient and family. After consideration of risks, benefits and other options for treatment, the patient has consented to  Procedure(s): UPPER ENDOSCOPIC ULTRASOUND (EUS) LINEAR (N/A) and radial EUS as a surgical intervention .  The patient's history has been reviewed, patient examined, no change in status, stable for surgery. Abd soft NT.  I have reviewed the patient's chart and labs.  Questions were answered to the patient's satisfaction.     Zada Girt

## 2015-09-25 NOTE — Anesthesia Postprocedure Evaluation (Signed)
Anesthesia Post Note  Patient: Deanette Kalkowski Skibicki  Procedure(s) Performed: Procedure(s) (LRB): UPPER ENDOSCOPIC ULTRASOUND (EUS) LINEAR (N/A)  Patient location during evaluation: Endoscopy Anesthesia Type: General Level of consciousness: awake and alert Pain management: pain level controlled Vital Signs Assessment: post-procedure vital signs reviewed and stable Respiratory status: spontaneous breathing, nonlabored ventilation, respiratory function stable and patient connected to nasal cannula oxygen Cardiovascular status: blood pressure returned to baseline and stable Postop Assessment: no signs of nausea or vomiting Anesthetic complications: no    Last Vitals:  Filed Vitals:   09/24/15 1405 09/24/15 1415  BP: 120/58 119/95  Pulse: 67 64  Temp:    Resp: 19 19    Last Pain: There were no vitals filed for this visit.               Martha Clan

## 2015-09-26 ENCOUNTER — Encounter: Payer: Self-pay | Admitting: Gastroenterology

## 2015-09-28 LAB — SURGICAL PATHOLOGY

## 2015-10-05 ENCOUNTER — Ambulatory Visit: Payer: Medicare Other | Admitting: Family Medicine

## 2016-01-26 ENCOUNTER — Ambulatory Visit: Payer: Medicare Other | Admitting: Family Medicine

## 2016-04-26 ENCOUNTER — Encounter (INDEPENDENT_AMBULATORY_CARE_PROVIDER_SITE_OTHER): Payer: Medicare Other | Admitting: Vascular Surgery

## 2016-05-06 ENCOUNTER — Other Ambulatory Visit
Admission: RE | Admit: 2016-05-06 | Discharge: 2016-05-06 | Disposition: A | Discharge status: Home / Self Care | Payer: Medicare Other | Source: Other Acute Inpatient Hospital | Attending: Internal Medicine | Admitting: Internal Medicine

## 2016-05-06 DIAGNOSIS — D649 Anemia, unspecified: Secondary | ICD-10-CM | POA: Diagnosis present

## 2016-05-06 LAB — HEMOGLOBIN: Hemoglobin: 8 g/dL — ABNORMAL LOW (ref 12.0–16.0)

## 2016-05-12 ENCOUNTER — Encounter: Payer: Self-pay | Admitting: Emergency Medicine

## 2016-05-12 ENCOUNTER — Emergency Department
Admission: EM | Admit: 2016-05-12 | Discharge: 2016-05-12 | Disposition: A | Discharge status: Home / Self Care | Payer: Medicare Other | Attending: Emergency Medicine | Admitting: Emergency Medicine

## 2016-05-12 DIAGNOSIS — K529 Noninfective gastroenteritis and colitis, unspecified: Secondary | ICD-10-CM | POA: Diagnosis not present

## 2016-05-12 DIAGNOSIS — N186 End stage renal disease: Secondary | ICD-10-CM | POA: Diagnosis not present

## 2016-05-12 DIAGNOSIS — Z992 Dependence on renal dialysis: Secondary | ICD-10-CM | POA: Insufficient documentation

## 2016-05-12 DIAGNOSIS — Z79899 Other long term (current) drug therapy: Secondary | ICD-10-CM | POA: Diagnosis not present

## 2016-05-12 DIAGNOSIS — I2510 Atherosclerotic heart disease of native coronary artery without angina pectoris: Secondary | ICD-10-CM | POA: Insufficient documentation

## 2016-05-12 DIAGNOSIS — Z87891 Personal history of nicotine dependence: Secondary | ICD-10-CM | POA: Diagnosis not present

## 2016-05-12 DIAGNOSIS — R112 Nausea with vomiting, unspecified: Secondary | ICD-10-CM | POA: Diagnosis present

## 2016-05-12 DIAGNOSIS — I132 Hypertensive heart and chronic kidney disease with heart failure and with stage 5 chronic kidney disease, or end stage renal disease: Secondary | ICD-10-CM | POA: Insufficient documentation

## 2016-05-12 DIAGNOSIS — I5032 Chronic diastolic (congestive) heart failure: Secondary | ICD-10-CM | POA: Insufficient documentation

## 2016-05-12 LAB — COMPREHENSIVE METABOLIC PANEL
ALT: 11 U/L — ABNORMAL LOW (ref 14–54)
ANION GAP: 10 (ref 5–15)
AST: 19 U/L (ref 15–41)
Albumin: 3.7 g/dL (ref 3.5–5.0)
Alkaline Phosphatase: 741 U/L — ABNORMAL HIGH (ref 38–126)
BILIRUBIN TOTAL: 0.5 mg/dL (ref 0.3–1.2)
BUN: 37 mg/dL — ABNORMAL HIGH (ref 6–20)
CHLORIDE: 99 mmol/L — AB (ref 101–111)
CO2: 27 mmol/L (ref 22–32)
Calcium: 9.4 mg/dL (ref 8.9–10.3)
Creatinine, Ser: 6.69 mg/dL — ABNORMAL HIGH (ref 0.44–1.00)
GFR calc Af Amer: 7 mL/min — ABNORMAL LOW (ref >60)
GFR, EST NON AFRICAN AMERICAN: 6 mL/min — AB (ref >60)
Glucose, Bld: 94 mg/dL (ref 65–99)
POTASSIUM: 4.5 mmol/L (ref 3.5–5.1)
Sodium: 136 mmol/L (ref 135–145)
TOTAL PROTEIN: 7.5 g/dL (ref 6.5–8.1)

## 2016-05-12 LAB — CBC WITH DIFFERENTIAL/PLATELET
BASOS ABS: 0 10*3/uL (ref 0–0.1)
Basophils Relative: 1 %
EOS PCT: 2 %
Eosinophils Absolute: 0.1 10*3/uL (ref 0–0.7)
HEMATOCRIT: 24.4 % — AB (ref 35.0–47.0)
Hemoglobin: 8.1 g/dL — ABNORMAL LOW (ref 12.0–16.0)
LYMPHS PCT: 7 %
Lymphs Abs: 0.3 10*3/uL — ABNORMAL LOW (ref 1.0–3.6)
MCH: 31.3 pg (ref 26.0–34.0)
MCHC: 33.1 g/dL (ref 32.0–36.0)
MCV: 94.6 fL (ref 80.0–100.0)
MONO ABS: 0.3 10*3/uL (ref 0.2–0.9)
MONOS PCT: 6 %
Neutro Abs: 3.7 10*3/uL (ref 1.4–6.5)
Neutrophils Relative %: 84 %
PLATELETS: 147 10*3/uL — AB (ref 150–440)
RBC: 2.58 MIL/uL — ABNORMAL LOW (ref 3.80–5.20)
RDW: 15.8 % — AB (ref 11.5–14.5)
WBC: 4.4 10*3/uL (ref 3.6–11.0)

## 2016-05-12 LAB — PROTIME-INR
INR: 0.98
PROTHROMBIN TIME: 13 s (ref 11.4–15.2)

## 2016-05-12 MED ORDER — ONDANSETRON 4 MG PO TBDP: 4.0000 mg | ORAL_TABLET | Freq: Three times a day (TID) | ORAL | 0 refills | Status: DC | PRN

## 2016-05-12 MED ORDER — LOPERAMIDE HCL 2 MG PO CAPS
4.0000 mg | ORAL_CAPSULE | Freq: Once | ORAL | Status: AC
  Administered 2016-05-12: 4 mg via ORAL
  Filled 2016-05-12: qty 2

## 2016-05-12 MED ORDER — ONDANSETRON 4 MG PO TBDP
4.0000 mg | ORAL_TABLET | Freq: Once | ORAL | Status: AC
  Administered 2016-05-12: 4 mg via ORAL
  Filled 2016-05-12: qty 1

## 2016-05-12 NOTE — ED Triage Notes (Signed)
EMS states pt is resident at [facility] and had been having bouts of nausea, vomiting (x3/24) and diarrhea (x2/24).  Pt states she did not want to come here but the staff at [facility] thought otherwise.  Pt states she has no complaints other that CC and is looking to go back home soon.  VS are WNL.

## 2016-05-12 NOTE — ED Provider Notes (Signed)
[facility] Emergency Department Provider Note        Time seen: ----------------------------------------- 9:38 PM on 05/12/2016 -----------------------------------------    I have reviewed the triage vital signs and the nursing notes.   HISTORY  Chief Complaint Nausea and Diarrhea    HPI Toni Carlson is a 70 y.o. female who presents to the ER being brought by EMS for nausea and vomiting. She is also had an episode of diarrhea. Reportedly a stomach virus has been going around the nursing home that she lives in. She did not want to be transported but was forced by the nursing home to come here for evaluation. She denies any other complaints at this time.   Past Medical History:  Diagnosis Date  . A-fib (HCC)    a. on warfarin  . Anemia   . Carotid artery stenosis    a. ultrasound 03/2015: RICA 32-67% stenosis, LICA less than 12% stenosis  . Chronic diastolic CHF (congestive heart failure) ([place])    a. echo 03/2015: EF 60-65%, normal wall motion, diastolic parameters were c/w restrictive physiology, indicative of decreased LV diastolic compliance and/or increased LA pressure, mild AS, mild MR, left atrium was severely dilated, RA was severely dilated, PASP was severely increased at 90 mm Hg  . Coronary artery disease, non-occlusive    a. cardiac cath 2013  . ESRD (end stage renal disease) ([place])    On Tue-Thur-Sat dialysis  . H/O ischemic right MCA stroke    a. 03/2015; b. residual left-sided facial droop and slurred speech  . Hyperparathyroidism, secondary renal ([place])   . Hypertension   . Nicotine dependence   . Pulmonary HTN    as above  . Symptomatic bradycardia    a. history of symptomatic bradycardia; b. felt to be 2/2 metabolic abnormalities & required temp wire but no PPM, resolved with HD    Patient Active Problem List   Diagnosis Date Noted  . Anemia 07/15/2015  . GI bleed 07/15/2015  . Polyneuropathy ([place]) 07/08/2015  .  Degenerative disc disease, lumbar 07/08/2015  . Pancytopenia ([place]) 07/08/2015  . Generalized weakness 07/08/2015  . End stage renal disease ([place]) 07/08/2015  . Atherosclerotic peripheral vascular disease ([place]) 07/08/2015  . Pain in both feet 07/02/2015  . Gastric ulceration   . Acute GI bleeding   . GIB (gastrointestinal bleeding) 04/12/2015  . Left-sided weakness 04/01/2015  . Essential hypertension, malignant 04/01/2015  . Anemia of chronic disease 04/01/2015  . Elevated troponin 04/01/2015  . Pulmonary hypertension 04/01/2015  . Atrial fibrillation ([place])   . CVA (cerebral infarction) 03/26/2015    Past Surgical History:  Procedure Laterality Date  . ESOPHAGOGASTRODUODENOSCOPY (EGD) WITH PROPOFOL N/A 04/14/2015   Procedure: ESOPHAGOGASTRODUODENOSCOPY (EGD) WITH PROPOFOL;  Surgeon: Lucilla Lame, MD;  Location: [facility] ENDOSCOPY;  Service: Endoscopy;  Laterality: N/A;  . ESOPHAGOGASTRODUODENOSCOPY (EGD) WITH PROPOFOL N/A 07/17/2015   Procedure: ESOPHAGOGASTRODUODENOSCOPY (EGD) WITH PROPOFOL;  Surgeon: Josefine Class, MD;  Location: [facility] ENDOSCOPY;  Service: Endoscopy;  Laterality: N/A;  . EUS N/A 09/24/2015   Procedure: UPPER ENDOSCOPIC ULTRASOUND (EUS) LINEAR;  Surgeon: Jola Schmidt, MD;  Location: [facility] ENDOSCOPY;  Service: Endoscopy;  Laterality: N/A;  . PERIPHERAL VASCULAR CATHETERIZATION N/A 02/23/2015   Procedure: A/V Shuntogram/Fistulagram;  Surgeon: Algernon Huxley, MD;  Location: [place] [facility] LAB;  Service: Cardiovascular;  Laterality: N/A;  . PERIPHERAL VASCULAR CATHETERIZATION N/A 02/23/2015   Procedure: A/V Shunt Intervention;  Surgeon: Algernon Huxley, MD;  Location: [place] [facility];  Service:  Cardiovascular;  Laterality: N/A;    Allergies Patient has no known allergies.  Social History Social History  Substance Use Topics  . Smoking status: Former Smoker    Packs/day: 0.25    Years: 30.00    Types: Cigarettes    Quit date: 11/20/2013  . Smokeless tobacco: Never  Used  . Alcohol use No    Review of Systems Constitutional: Negative for fever. Cardiovascular: Negative for chest pain. Respiratory: Negative for shortness of breath. Gastrointestinal: Negative for abdominal pain, Positive for vomiting and diarrhea Skin: Negative for rash. Neurological: Negative for headaches, focal weakness or numbness.  10-point ROS otherwise negative.  ____________________________________________   PHYSICAL EXAM:  VITAL SIGNS: ED Triage Vitals  Enc Vitals Group     BP 05/12/16 2135 (!) 167/53     Pulse Rate 05/12/16 2135 96     Resp 05/12/16 2135 18     Temp 05/12/16 2135 99.2 F (37.3 C)     Temp Source 05/12/16 2135 Oral     SpO2 05/12/16 2133 97 %     Weight 05/12/16 2136 167 lb 8.8 oz (76 kg)     Height 05/12/16 2136 (!) 5.6" (0.142 m)     Head Circumference --      Peak Flow --      Pain Score --      Pain Loc --      Pain Edu? --      Excl. in [place]? --     Constitutional: Alert and oriented. Well appearing and in no distress. Eyes: Conjunctivae are normal. PERRL. Normal extraocular movements. ENT   Head: Normocephalic and atraumatic.   Nose: No congestion/rhinnorhea.   Mouth/Throat: Mucous membranes are moist.   Neck: No stridor. Cardiovascular: Normal rate, regular rhythm. No murmurs, rubs, or gallops. Respiratory: Normal respiratory effort without tachypnea nor retractions. Breath sounds are clear and equal bilaterally. No wheezes/rales/rhonchi. Gastrointestinal: Soft and nontender. Normal bowel sounds Musculoskeletal: Nontender with normal range of motion in all extremities. No lower extremity tenderness nor edema. Neurologic:  Normal speech and language. No gross focal neurologic deficits are appreciated.  Skin:  Skin is warm, dry and intact. No rash noted. Psychiatric: Mood and affect are normal. Speech and behavior are normal.  ____________________________________________  ED COURSE:  Pertinent labs & imaging results  that were available during my care of the patient were reviewed by me and considered in my medical decision making (see chart for details). Clinical Course   Patient presents to the ER in no distress with likely gastroenteritis. We will assess with basic labs, give oral Zofran and Imodium.  Procedures ____________________________________________   LABS (pertinent positives/negatives)  Labs Reviewed  CBC WITH DIFFERENTIAL/PLATELET - Abnormal; Notable for the following:       Result Value   RBC 2.58 (*)    Hemoglobin 8.1 (*)    HCT 24.4 (*)    RDW 15.8 (*)    Platelets 147 (*)    Lymphs Abs 0.3 (*)    All other components within normal limits  COMPREHENSIVE METABOLIC PANEL - Abnormal; Notable for the following:    Chloride 99 (*)    BUN 37 (*)    Creatinine, Ser 6.69 (*)    ALT 11 (*)    Alkaline Phosphatase 741 (*)    GFR calc non Af Amer 6 (*)    GFR calc Af Amer 7 (*)    All other components within normal limits  PROTIME-INR   ____________________________________________  FINAL ASSESSMENT AND PLAN  Vomiting, diarrhea, End-stage renal disease  Plan: Patient with labs as dictated above. Patient is in no acute distress, labs are within normal limits for her. She'll be discharged with Zofran and she is stable for outpatient follow-up.   Earleen Newport, MD   Note: This dictation was prepared with Dragon dictation. Any transcriptional errors that result from this process are unintentional    Earleen Newport, MD 05/12/16 2217

## 2016-05-24 ENCOUNTER — Encounter (INDEPENDENT_AMBULATORY_CARE_PROVIDER_SITE_OTHER): Payer: Self-pay | Admitting: Vascular Surgery

## 2016-05-24 ENCOUNTER — Ambulatory Visit: Payer: Medicare Other | Admitting: Gastroenterology

## 2016-05-24 ENCOUNTER — Ambulatory Visit (INDEPENDENT_AMBULATORY_CARE_PROVIDER_SITE_OTHER): Payer: Medicare Other | Admitting: Vascular Surgery

## 2016-05-24 ENCOUNTER — Encounter (INDEPENDENT_AMBULATORY_CARE_PROVIDER_SITE_OTHER): Payer: Self-pay

## 2016-05-24 VITALS — BP 125/46 | HR 46 | Resp 16 | Ht 66.0 in | Wt 141.0 lb

## 2016-05-24 DIAGNOSIS — N186 End stage renal disease: Secondary | ICD-10-CM

## 2016-05-24 DIAGNOSIS — I70223 Atherosclerosis of native arteries of extremities with rest pain, bilateral legs: Secondary | ICD-10-CM | POA: Diagnosis not present

## 2016-05-24 DIAGNOSIS — D631 Anemia in chronic kidney disease: Secondary | ICD-10-CM

## 2016-05-24 DIAGNOSIS — I10 Essential (primary) hypertension: Secondary | ICD-10-CM | POA: Diagnosis not present

## 2016-05-24 DIAGNOSIS — Z992 Dependence on renal dialysis: Secondary | ICD-10-CM

## 2016-05-24 DIAGNOSIS — I70229 Atherosclerosis of native arteries of extremities with rest pain, unspecified extremity: Secondary | ICD-10-CM | POA: Insufficient documentation

## 2016-05-24 NOTE — Assessment & Plan Note (Signed)
[place]-standing. No acute symptoms.

## 2016-05-24 NOTE — Assessment & Plan Note (Signed)
blood pressure control important in reducing the progression of atherosclerotic disease. On appropriate oral medications.  

## 2016-05-24 NOTE — Assessment & Plan Note (Signed)
The patient has what sounds like ischemic rest pain worse in the right leg than the left. She has a noninvasive study performed by an outside ultrasonographer suggesting severe arterial insufficiency bilaterally. Given her renal failure and multiple other issues, this is clearly a limb threatening and critical situation. I have discussed angiography as the next step in evaluation and potential treatment of her disease. I discussed the risks and benefits of the procedure and she is agreeable to proceed. We will plan right leg angiography and may need to perform a left leg angiogram in the upcoming weeks as well.

## 2016-05-24 NOTE — Assessment & Plan Note (Signed)
On dialysis. Access is working well.

## 2016-05-24 NOTE — Progress Notes (Signed)
Patient ID: Toni Carlson, female   DOB: December 06, 1945, 71 y.o.   MRN: [id number]  Chief Complaint  Patient presents with  . Follow-up    HPI Toni Carlson is a 71 y.o. female.  I am asked to see the patient by Dr. Velta Addison for evaluation of PAD with rest pain.  The patient reports Not being able to sleep at night secondary to the pain in her right foot. She has pain in her left foot and ankle but not as severe as the right. She is unable to walk any distance for her leg starts hurting. She has some areas that appear worrisome for skin breakdown on the first and second toe of the right foot. She had a study performed by Drs. making house calls which suggested monophasic flow throughout both lower extremities consistent with severe peripheral arterial disease. The symptoms have been steadily progressing over months to years. She has [place]-standing end-stage renal disease and we have previously seen her for her dialysis access. She has multiple other medical comorbidities as well as listed below.   Past Medical History:  Diagnosis Date  . A-fib (HCC)    a. on warfarin  . Anemia   . Carotid artery stenosis    a. ultrasound 03/2015: RICA 23-30% stenosis, LICA less than 07% stenosis  . Chronic diastolic CHF (congestive heart failure) ([place])    a. echo 03/2015: EF 60-65%, normal wall motion, diastolic parameters were c/w restrictive physiology, indicative of decreased LV diastolic compliance and/or increased LA pressure, mild AS, mild MR, left atrium was severely dilated, RA was severely dilated, PASP was severely increased at 90 mm Hg  . Coronary artery disease, non-occlusive    a. cardiac cath 2013  . ESRD (end stage renal disease) ([place])    On Tue-Thur-Sat dialysis  . H/O ischemic right MCA stroke    a. 03/2015; b. residual left-sided facial droop and slurred speech  . Hyperparathyroidism, secondary renal ([place])   . Hypertension   . Nicotine dependence   . Pulmonary HTN    as above  .  Symptomatic bradycardia    a. history of symptomatic bradycardia; b. felt to be 2/2 metabolic abnormalities & required temp wire but no PPM, resolved with HD    Past Surgical History:  Procedure Laterality Date  . ESOPHAGOGASTRODUODENOSCOPY (EGD) WITH PROPOFOL N/A 04/14/2015   Procedure: ESOPHAGOGASTRODUODENOSCOPY (EGD) WITH PROPOFOL;  Surgeon: Lucilla Lame, MD;  Location: [facility] ENDOSCOPY;  Service: Endoscopy;  Laterality: N/A;  . ESOPHAGOGASTRODUODENOSCOPY (EGD) WITH PROPOFOL N/A 07/17/2015   Procedure: ESOPHAGOGASTRODUODENOSCOPY (EGD) WITH PROPOFOL;  Surgeon: Josefine Class, MD;  [facility] ENDOSCOPY;  Service: Endoscopy;  Laterality: N/A;  . EUS N/A 09/24/2015   Procedure: UPPER ENDOSCOPIC ULTRASOUND (EUS) LINEAR;  Surgeon: Jola Schmidt, MD;  Location: [facility] ENDOSCOPY;  Service: Endoscopy;  Laterality: N/A;  . PERIPHERAL VASCULAR CATHETERIZATION N/A 02/23/2015   Procedure: A/V Shuntogram/Fistulagram;  Surgeon: Algernon Huxley, MD;  Location: [place] [facility];  Service: Cardiovascular;  Laterality: N/A;  . PERIPHERAL VASCULAR CATHETERIZATION N/A 02/23/2015   Procedure: A/V Shunt Intervention;  Surgeon: Algernon Huxley, MD;  Location: [place] [facility];  Service: Cardiovascular;  Laterality: N/A;    Family History  Problem Relation Age of Onset  . CAD Father   . Dementia Mother   No bleeding or clotting disorders  Social History Social History  Substance Use Topics  . Smoking status: Former Smoker    Packs/day: 0.25    Years: 30.00  Types: Cigarettes    Quit date: 11/20/2013  . Smokeless tobacco: Never Used  . Alcohol use No  Lives in a nursing facility  No Known Allergies  Current Outpatient Prescriptions  Medication Sig Dispense Refill  . alendronate (FOSAMAX) 10 MG tablet Take 10 mg by mouth daily before breakfast. Take with a full glass of water on an empty stomach.    Marland Kitchen amLODipine (NORVASC) 10 MG tablet Take 1 tablet (10 mg total) by mouth daily. 30 tablet 0  .  atorvastatin (LIPITOR) 40 MG tablet Take 1 tablet (40 mg total) by mouth daily. 30 tablet 0  . calcium acetate, Phos Binder, (PHOSLYRA) 667 MG/5ML SOLN Take 10 mLs (1,334 mg total) by mouth 3 (three) times daily with meals. 450 mL 1  . carvedilol (COREG) 6.25 MG tablet Take 1 tablet (6.25 mg total) by mouth 2 (two) times daily with a meal. 60 tablet 0  . cinacalcet (SENSIPAR) 30 MG tablet Take 2 tablets (60 mg total) by mouth daily. 60 tablet 0  . feeding supplement, ENSURE COMPLETE, (ENSURE COMPLETE) LIQD Take 237 mLs by mouth 3 (three) times daily with meals. 90 Bottle 6  . hydrALAZINE (APRESOLINE) 25 MG tablet Take 1 tablet (25 mg total) by mouth 3 (three) times daily. 90 tablet 0  . losartan (COZAAR) 100 MG tablet Take 1 tablet (100 mg total) by mouth daily. 30 tablet 0  . ondansetron (ZOFRAN ODT) 4 MG disintegrating tablet Take 1 tablet (4 mg total) by mouth every 8 (eight) hours as needed for nausea or vomiting. 20 tablet 0  . pantoprazole (PROTONIX) 40 MG tablet Take 1 tablet (40 mg total) by mouth 2 (two) times daily. 60 tablet 0  . polyethylene glycol (MIRALAX / GLYCOLAX) packet Take 17 g by mouth daily as needed for mild constipation. 14 each 0  . pregabalin (LYRICA) 25 MG capsule Take 1 capsule (25 mg total) by mouth 2 (two) times daily. 60 capsule 0  . sevelamer carbonate (RENVELA) 800 MG tablet Take 800 mg by mouth 3 (three) times daily with meals.    . sucralfate (CARAFATE) 1 GM/10ML suspension Take 1 g by mouth 4 (four) times daily -  with meals and at bedtime.    . trolamine salicylate (ASPERCREME) 10 % cream Apply topically 2 (two) times daily as needed for muscle pain. 85 g 0   No current facility-administered medications for this visit.       REVIEW OF SYSTEMS (Negative unless checked)  Constitutional: '[]' Weight loss  '[]' Fever  '[]' Chills Cardiac: '[]' Chest pain   '[]' Chest pressure   '[x]' Palpitations   '[]' Shortness of breath when laying flat   '[]' Shortness of breath at rest    '[x]' Shortness of breath with exertion. Vascular:  '[]' Pain in legs with walking   '[x]' Pain in legs at rest   '[]' Pain in legs when laying flat   '[]' Claudication   '[]' Pain in feet when walking  '[x]' Pain in feet at rest  '[]' Pain in feet when laying flat   '[]' History of DVT   '[]' Phlebitis   '[]' Swelling in legs   '[]' Varicose veins   '[]' Non-healing ulcers Pulmonary:   '[]' Uses home oxygen   '[]' Productive cough   '[]' Hemoptysis   '[]' Wheeze  '[]' COPD   '[]' Asthma Neurologic:  '[]' Dizziness  '[]' Blackouts   '[]' Seizures   '[x]' History of stroke   '[]' History of TIA  '[]' Aphasia   '[]' Temporary blindness   '[]' Dysphagia   '[]' Weakness or numbness in arms   '[]' Weakness or numbness in legs Musculoskeletal:  '[x]' Arthritis   '[]' Joint  swelling   '[]' Joint pain   '[]' Low back pain Hematologic:  '[]' Easy bruising  '[]' Easy bleeding   '[]' Hypercoagulable state   '[x]' Anemic  '[]' Hepatitis Gastrointestinal:  '[]' Blood in stool   '[]' Vomiting blood  '[]' Gastroesophageal reflux/heartburn   '[]' Abdominal pain Genitourinary:  '[x]' Chronic kidney disease   '[]' Difficult urination  '[]' Frequent urination  '[]' Burning with urination   '[]' Hematuria Skin:  '[]' Rashes   '[]' Ulcers   '[]' Wounds Psychological:  '[]' History of anxiety   '[]'  History of major depression.    Physical Exam BP (!) 125/46   Pulse (!) 46   Resp 16   Ht '5\' 6"'  (1.676 m)   Wt 141 lb (64 kg)   LMP  (LMP Unknown)   BMI 22.76 kg/m  Gen:  WD/WN, NAD Head: [place]/AT, No temporalis wasting. Prominent temp pulse not noted. Ear/Nose/Throat: Hearing grossly intact, nares w/o erythema or drainage, oropharynx w/o Erythema/Exudate Eyes: Conjunctiva clear, sclera non-icteric  Neck: trachea midline.  No JVD.  Pulmonary:  Good air movement, clear to auscultation bilaterally.  Cardiac: RRR, normal S1, S2, no Murmurs, rubs or gallops. Vascular: Thrill and bruit are present and left upper arm AV access Vessel Right Left  Radial Palpable Palpable  Ulnar Palpable Palpable  Brachial Palpable Palpable  Carotid Palpable, without bruit Palpable,  without bruit  Aorta Not palpable N/A  Femoral Trace Palpable 1+ Palpable  Popliteal Not Palpable Not Palpable  PT Not Palpable Trace Palpable  DP Not Palpable Not Palpable   Gastrointestinal: soft, non-tender/non-distended. No guarding/reflex. No masses, surgical incisions, or scars. Musculoskeletal: Uses a wheelchair today. Moves all 4 extremities. Strength appears intact on the right and decreased on the left Neurologic: Sensation grossly intact in extremities.  Weak on the left side. Psychiatric: Judgment intact, Mood & affect appropriate for pt's clinical situation. Dermatologic: No rashes or ulcers noted.  No cellulitis or open wounds. Lymph : No Cervical, Axillary, or Inguinal lymphadenopathy.   Radiology No results found.  Labs Recent Results (from the past 2160 hour(s))  Hemoglobin     Status: Abnormal   Collection Time: 05/06/16  2:09 PM  Result Value Ref Range   Hemoglobin 8.0 (L) 12.0 - 16.0 g/dL  CBC with Differential/Platelet     Status: Abnormal   Collection Time: 05/12/16  9:36 PM  Result Value Ref Range   WBC 4.4 3.6 - 11.0 K/uL   RBC 2.58 (L) 3.80 - 5.20 MIL/uL   Hemoglobin 8.1 (L) 12.0 - 16.0 g/dL   HCT 24.4 (L) 35.0 - 47.0 %   MCV 94.6 80.0 - 100.0 fL   MCH 31.3 26.0 - 34.0 pg   MCHC 33.1 32.0 - 36.0 g/dL   RDW 15.8 (H) 11.5 - 14.5 %   Platelets 147 (L) 150 - 440 K/uL   Neutrophils Relative % 84 %   Neutro Abs 3.7 1.4 - 6.5 K/uL   Lymphocytes Relative 7 %   Lymphs Abs 0.3 (L) 1.0 - 3.6 K/uL   Monocytes Relative 6 %   Monocytes Absolute 0.3 0.2 - 0.9 K/uL   Eosinophils Relative 2 %   Eosinophils Absolute 0.1 0 - 0.7 K/uL   Basophils Relative 1 %   Basophils Absolute 0.0 0 - 0.1 K/uL  Comprehensive metabolic panel     Status: Abnormal   Collection Time: 05/12/16  9:36 PM  Result Value Ref Range   Sodium 136 135 - 145 mmol/L   Potassium 4.5 3.5 - 5.1 mmol/L   Chloride 99 (L) 101 - 111 mmol/L   CO2 27 22 -  32 mmol/L   Glucose, Bld 94 65 - 99 mg/dL     BUN 37 (H) 6 - 20 mg/dL   Creatinine, Ser 6.69 (H) 0.44 - 1.00 mg/dL   Calcium 9.4 8.9 - 10.3 mg/dL   Total Protein 7.5 6.5 - 8.1 g/dL   Albumin 3.7 3.5 - 5.0 g/dL   AST 19 15 - 41 U/L   ALT 11 (L) 14 - 54 U/L   Alkaline Phosphatase 741 (H) 38 - 126 U/L   Total Bilirubin 0.5 0.3 - 1.2 mg/dL   GFR calc non Af Amer 6 (L) >60 mL/min   GFR calc Af Amer 7 (L) >60 mL/min    Comment: (NOTE) The eGFR has been calculated using the CKD EPI equation. This calculation has not been validated in all clinical situations. eGFR's persistently <60 mL/min signify possible Chronic Kidney Disease.    Anion gap 10 5 - 15  Protime-INR     Status: None   Collection Time: 05/12/16  9:36 PM  Result Value Ref Range   Prothrombin Time 13.0 11.4 - 15.2 seconds   INR 0.98     Assessment/Plan:  Anemia [place]-standing. No acute symptoms.  End stage renal disease ([place]) On dialysis. Access is working well.  Essential hypertension, malignant blood pressure control important in reducing the progression of atherosclerotic disease. On appropriate oral medications.   Atherosclerosis of native arteries of extremity with rest pain [employer]) The patient has what sounds like ischemic rest pain worse in the right leg than the left. She has a noninvasive study performed by an outside ultrasonographer suggesting severe arterial insufficiency bilaterally. Given her renal failure and multiple other issues, this is clearly a limb threatening and critical situation. I have discussed angiography as the next step in evaluation and potential treatment of her disease. I discussed the risks and benefits of the procedure and she is agreeable to proceed. We will plan right leg angiography and may need to perform a left leg angiogram in the upcoming weeks as well.      Leotis Pain 05/24/2016, 1:18 PM   This note was created with Dragon medical transcription system.  Any errors from dictation are unintentional.

## 2016-05-26 ENCOUNTER — Ambulatory Visit: Payer: Medicare Other | Admitting: Gastroenterology

## 2016-05-26 ENCOUNTER — Other Ambulatory Visit (INDEPENDENT_AMBULATORY_CARE_PROVIDER_SITE_OTHER): Payer: Self-pay | Admitting: Vascular Surgery

## 2016-05-30 ENCOUNTER — Other Ambulatory Visit: Payer: Self-pay

## 2016-05-30 ENCOUNTER — Ambulatory Visit (INDEPENDENT_AMBULATORY_CARE_PROVIDER_SITE_OTHER): Payer: Medicare Other | Admitting: Gastroenterology

## 2016-05-30 ENCOUNTER — Encounter (INDEPENDENT_AMBULATORY_CARE_PROVIDER_SITE_OTHER): Payer: Self-pay

## 2016-05-30 VITALS — BP 188/71 | HR 57 | Temp 97.8°F | Ht 66.0 in | Wt 151.0 lb

## 2016-05-30 DIAGNOSIS — D509 Iron deficiency anemia, unspecified: Secondary | ICD-10-CM

## 2016-05-30 DIAGNOSIS — I70223 Atherosclerosis of native arteries of extremities with rest pain, bilateral legs: Secondary | ICD-10-CM

## 2016-05-30 NOTE — H&P (Signed)
[facility] History & Physical Update  The patient was interviewed and re-examined.  The patient's previous History and Physical has been reviewed and is unchanged.  There is no change in the plan of care. We plan to proceed with the scheduled procedure.  Leotis Pain, MD  05/30/2016, 8:10 AM

## 2016-05-30 NOTE — Progress Notes (Signed)
Primary Care Physician: Murlean Iba, MD  Primary Gastroenterologist:  Dr. Lucilla Lame  Chief Complaint  Patient presents with  . GI Bleeding    HPI: Toni Carlson is a 71 y.o. female here for possible GI bleeding.  The patient has had a baseline hemoglobin between 7.6 and 10.4.  Back in May of last year she had a hemoglobin of 10.8 with a dropping to 8.0 in December.  The patient has had upper endoscopies in the past with a lesion found in the small bowel that was a Brunner's glands area the patient had an endoscopic ultrasound to evaluate it.  This was all done by Dr. Rayann Heman.  The patient denies having a colonoscopy in the last 12 years.  Despite the anemia the patient's MCV has been 94 and historically normal.  The patient had a normal folate tested last year.Her iron studies showed her saturation to be low at 10%.She also had a ferritin that was elevated at 538.  These were done back in March of last year as well as a stool occult card which was positive.  Current Outpatient Prescriptions  Medication Sig Dispense Refill  . alendronate (FOSAMAX) 10 MG tablet Take 10 mg by mouth daily before breakfast. Take with a full glass of water on an empty stomach.    Marland Kitchen amLODipine (NORVASC) 10 MG tablet Take 1 tablet (10 mg total) by mouth daily. 30 tablet 0  . atorvastatin (LIPITOR) 40 MG tablet Take 1 tablet (40 mg total) by mouth daily. 30 tablet 0  . calcium acetate, Phos Binder, (PHOSLYRA) 667 MG/5ML SOLN Take 10 mLs (1,334 mg total) by mouth 3 (three) times daily with meals. 450 mL 1  . carvedilol (COREG) 6.25 MG tablet Take 1 tablet (6.25 mg total) by mouth 2 (two) times daily with a meal. 60 tablet 0  . cinacalcet (SENSIPAR) 30 MG tablet Take 2 tablets (60 mg total) by mouth daily. 60 tablet 0  . feeding supplement, ENSURE COMPLETE, (ENSURE COMPLETE) LIQD Take 237 mLs by mouth 3 (three) times daily with meals. 90 Bottle 6  . hydrALAZINE (APRESOLINE) 25 MG tablet Take 1 tablet (25 mg total) by  mouth 3 (three) times daily. 90 tablet 0  . losartan (COZAAR) 100 MG tablet Take 1 tablet (100 mg total) by mouth daily. 30 tablet 0  . ondansetron (ZOFRAN ODT) 4 MG disintegrating tablet Take 1 tablet (4 mg total) by mouth every 8 (eight) hours as needed for nausea or vomiting. 20 tablet 0  . pantoprazole (PROTONIX) 40 MG tablet Take 1 tablet (40 mg total) by mouth 2 (two) times daily. 60 tablet 0  . polyethylene glycol (MIRALAX / GLYCOLAX) packet Take 17 g by mouth daily as needed for mild constipation. 14 each 0  . pregabalin (LYRICA) 25 MG capsule Take 1 capsule (25 mg total) by mouth 2 (two) times daily. 60 capsule 0  . sevelamer carbonate (RENVELA) 800 MG tablet Take 800 mg by mouth 3 (three) times daily with meals.    . sucralfate (CARAFATE) 1 GM/10ML suspension Take 1 g by mouth 4 (four) times daily -  with meals and at bedtime.    . trolamine salicylate (ASPERCREME) 10 % cream Apply topically 2 (two) times daily as needed for muscle pain. 85 g 0  . traZODone (DESYREL) 50 MG tablet     . VOLTAREN 1 % GEL      No current facility-administered medications for this visit.     Allergies as of 05/30/2016  . (  No Known Allergies)    ROS:  General: Negative for anorexia, weight loss, fever, chills, fatigue, weakness. ENT: Negative for hoarseness, difficulty swallowing , nasal congestion. CV: Negative for chest pain, angina, palpitations, dyspnea on exertion, peripheral edema.  Respiratory: Negative for dyspnea at rest, dyspnea on exertion, cough, sputum, wheezing.  GI: See history of present illness. GU:  Negative for dysuria, hematuria, urinary incontinence, urinary frequency, nocturnal urination.  Endo: Negative for unusual weight change.    Physical Examination:   BP (!) 188/71   Pulse (!) 57   Temp 97.8 F (36.6 C) (Oral)   Ht 5\' 6"  (1.676 m)   Wt 151 lb (68.5 kg)   LMP  (LMP Unknown)   BMI 24.37 kg/m   General: Well-nourished, well-developed in no acute distress.  Eyes:  No icterus. Conjunctivae pink. Mouth: Oropharyngeal mucosa moist and pink , no lesions erythema or exudate. Lungs: Clear to auscultation bilaterally. Non-labored. Heart: Regular rate and rhythm, no murmurs rubs or gallops.  Abdomen: Bowel sounds are normal, nontender, nondistended, no hepatosplenomegaly or masses, no abdominal bruits or hernia , no rebound or guarding.   Extremities: No lower extremity edema. No clubbing or deformities. Neuro: Alert and oriented x 3.  Grossly intact.Patient sitting in a wheelchair Skin: Warm and dry, no jaundice.   Psych: Alert and cooperative, normal mood and affect.  Labs:    Imaging Studies: No results found.  Assessment and Plan:   Toni Carlson is a 71 y.o. y/o female who comes in today with a history of anemia.  The patient has had multiple upper endoscopies in the past with no source of any anemia seen.  The patient has low iron stores although the ferritin is elevated.  The patient will be set up for colonoscopy due to her not having a colonoscopy in many years. I have discussed risks & benefits which include, but are not limited to, bleeding, infection, perforation & drug reaction.  The patient agrees with this plan & written consent will be obtained.       Lucilla Lame, MD. Marval Regal   Note: This dictation was prepared with Dragon dictation along with smaller phrase technology. Any transcriptional errors that result from this process are unintentional.

## 2016-06-01 ENCOUNTER — Other Ambulatory Visit: Payer: Self-pay

## 2016-06-08 MED ORDER — CEFAZOLIN IN D5W 1 GM/50ML IV SOLN
1.0000 g | INTRAVENOUS | Status: AC
  Administered 2016-06-09: 1 g via INTRAVENOUS

## 2016-06-09 ENCOUNTER — Observation Stay
Admission: RE | Admit: 2016-06-09 | Discharge: 2016-06-10 | Disposition: A | Discharge status: Home / Self Care | Payer: Medicare Other | Source: Ambulatory Visit | Attending: Vascular Surgery | Admitting: Vascular Surgery

## 2016-06-09 ENCOUNTER — Encounter
Admission: RE | Disposition: A | Discharge status: Home / Self Care | Payer: Self-pay | Source: Ambulatory Visit | Attending: Vascular Surgery

## 2016-06-09 DIAGNOSIS — N186 End stage renal disease: Secondary | ICD-10-CM | POA: Insufficient documentation

## 2016-06-09 DIAGNOSIS — Z992 Dependence on renal dialysis: Secondary | ICD-10-CM | POA: Diagnosis not present

## 2016-06-09 DIAGNOSIS — Z87891 Personal history of nicotine dependence: Secondary | ICD-10-CM | POA: Diagnosis not present

## 2016-06-09 DIAGNOSIS — I70221 Atherosclerosis of native arteries of extremities with rest pain, right leg: Secondary | ICD-10-CM | POA: Diagnosis not present

## 2016-06-09 DIAGNOSIS — Z8249 Family history of ischemic heart disease and other diseases of the circulatory system: Secondary | ICD-10-CM | POA: Insufficient documentation

## 2016-06-09 DIAGNOSIS — D649 Anemia, unspecified: Secondary | ICD-10-CM | POA: Insufficient documentation

## 2016-06-09 DIAGNOSIS — Z820 Family history of epilepsy and other diseases of the nervous system: Secondary | ICD-10-CM | POA: Insufficient documentation

## 2016-06-09 DIAGNOSIS — D638 Anemia in other chronic diseases classified elsewhere: Secondary | ICD-10-CM | POA: Diagnosis not present

## 2016-06-09 DIAGNOSIS — I5032 Chronic diastolic (congestive) heart failure: Secondary | ICD-10-CM | POA: Insufficient documentation

## 2016-06-09 DIAGNOSIS — Z9889 Other specified postprocedural states: Secondary | ICD-10-CM | POA: Insufficient documentation

## 2016-06-09 DIAGNOSIS — I132 Hypertensive heart and chronic kidney disease with heart failure and with stage 5 chronic kidney disease, or end stage renal disease: Secondary | ICD-10-CM | POA: Insufficient documentation

## 2016-06-09 DIAGNOSIS — I6523 Occlusion and stenosis of bilateral carotid arteries: Secondary | ICD-10-CM | POA: Insufficient documentation

## 2016-06-09 DIAGNOSIS — Z8673 Personal history of transient ischemic attack (TIA), and cerebral infarction without residual deficits: Secondary | ICD-10-CM | POA: Diagnosis not present

## 2016-06-09 DIAGNOSIS — I4891 Unspecified atrial fibrillation: Secondary | ICD-10-CM | POA: Insufficient documentation

## 2016-06-09 DIAGNOSIS — I70228 Atherosclerosis of native arteries of extremities with rest pain, other extremity: Secondary | ICD-10-CM | POA: Diagnosis present

## 2016-06-09 DIAGNOSIS — I70219 Atherosclerosis of native arteries of extremities with intermittent claudication, unspecified extremity: Secondary | ICD-10-CM | POA: Diagnosis present

## 2016-06-09 DIAGNOSIS — E0580 Other thyrotoxicosis without thyrotoxic crisis or storm: Secondary | ICD-10-CM | POA: Insufficient documentation

## 2016-06-09 DIAGNOSIS — I2510 Atherosclerotic heart disease of native coronary artery without angina pectoris: Secondary | ICD-10-CM | POA: Diagnosis not present

## 2016-06-09 HISTORY — PX: LOWER EXTREMITY ANGIOGRAPHY: CATH118251

## 2016-06-09 LAB — POTASSIUM (ARMC VASCULAR LAB ONLY): POTASSIUM (ARMC VASCULAR LAB): 3.8 (ref 3.5–5.1)

## 2016-06-09 SURGERY — LOWER EXTREMITY ANGIOGRAPHY
Anesthesia: Moderate Sedation | Laterality: Right

## 2016-06-09 MED ORDER — HYDRALAZINE HCL 20 MG/ML IJ SOLN
INTRAMUSCULAR | Status: DC | PRN
  Administered 2016-06-09: 20 mg via INTRAVENOUS

## 2016-06-09 MED ORDER — DIPHENHYDRAMINE HCL 50 MG/ML IJ SOLN
INTRAMUSCULAR | Status: DC | PRN
  Administered 2016-06-09: 50 mg via INTRAVENOUS

## 2016-06-09 MED ORDER — LABETALOL HCL 5 MG/ML IV SOLN
INTRAVENOUS | Status: DC | PRN
Start: 2016-06-09 — End: 2016-06-09
  Administered 2016-06-09: 20 mg via INTRAVENOUS

## 2016-06-09 MED ORDER — LABETALOL HCL 5 MG/ML IV SOLN: 10.0000 mg | INTRAVENOUS | Status: DC | PRN

## 2016-06-09 MED ORDER — METOPROLOL TARTRATE 5 MG/5ML IV SOLN: 2.0000 mg | INTRAVENOUS | Status: DC | PRN

## 2016-06-09 MED ORDER — SEVELAMER CARBONATE 800 MG PO TABS
800.0000 mg | ORAL_TABLET | Freq: Three times a day (TID) | ORAL | Status: DC
  Administered 2016-06-10: 800 mg via ORAL
  Filled 2016-06-09: qty 1

## 2016-06-09 MED ORDER — HYDROMORPHONE HCL 1 MG/ML IJ SOLN
INTRAMUSCULAR | Status: AC
  Administered 2016-06-09: 0.5 mg via INTRAVENOUS
  Filled 2016-06-09: qty 0.5

## 2016-06-09 MED ORDER — ATORVASTATIN CALCIUM 20 MG PO TABS
40.0000 mg | ORAL_TABLET | Freq: Every day | ORAL | Status: DC
  Administered 2016-06-09 – 2016-06-10 (×2): 40 mg via ORAL
  Filled 2016-06-09 (×2): qty 2

## 2016-06-09 MED ORDER — TRAZODONE HCL 50 MG PO TABS
50.0000 mg | ORAL_TABLET | Freq: Every day | ORAL | Status: DC
  Administered 2016-06-09: 50 mg via ORAL
  Filled 2016-06-09: qty 1

## 2016-06-09 MED ORDER — ONDANSETRON HCL 4 MG/2ML IJ SOLN: 4.0000 mg | Freq: Four times a day (QID) | INTRAMUSCULAR | Status: DC | PRN

## 2016-06-09 MED ORDER — PHENOL 1.4 % MT LIQD
1.0000 | OROMUCOSAL | Status: DC | PRN
  Filled 2016-06-09: qty 177

## 2016-06-09 MED ORDER — ALUM & MAG HYDROXIDE-SIMETH 200-200-20 MG/5ML PO SUSP: 15.0000 mL | ORAL | Status: DC | PRN

## 2016-06-09 MED ORDER — PANTOPRAZOLE SODIUM 40 MG PO TBEC
40.0000 mg | DELAYED_RELEASE_TABLET | Freq: Two times a day (BID) | ORAL | Status: DC
  Administered 2016-06-09 – 2016-06-10 (×2): 40 mg via ORAL
  Filled 2016-06-09 (×2): qty 1

## 2016-06-09 MED ORDER — HYDROMORPHONE HCL 1 MG/ML IJ SOLN
0.5000 mg | INTRAMUSCULAR | Status: DC | PRN
  Administered 2016-06-09: 0.5 mg via INTRAVENOUS
  Administered 2016-06-10 (×3): 1 mg via INTRAVENOUS
  Filled 2016-06-09 (×4): qty 1

## 2016-06-09 MED ORDER — HYDRALAZINE HCL 25 MG PO TABS
25.0000 mg | ORAL_TABLET | Freq: Three times a day (TID) | ORAL | Status: DC
  Administered 2016-06-09: 25 mg via ORAL
  Filled 2016-06-09: qty 1

## 2016-06-09 MED ORDER — HEPARIN SODIUM (PORCINE) 1000 UNIT/ML IJ SOLN
INTRAMUSCULAR | Status: DC | PRN
  Administered 2016-06-09: 4000 [IU] via INTRAVENOUS

## 2016-06-09 MED ORDER — CALCIUM ACETATE (PHOS BINDER) 667 MG/5ML PO SOLN
1334.0000 mg | Freq: Three times a day (TID) | ORAL | Status: DC
  Administered 2016-06-10: 1334 mg via ORAL
  Filled 2016-06-09 (×3): qty 10

## 2016-06-09 MED ORDER — FAMOTIDINE 20 MG PO TABS: 40.0000 mg | ORAL_TABLET | ORAL | Status: DC | PRN

## 2016-06-09 MED ORDER — HEPARIN (PORCINE) IN NACL 2-0.9 UNIT/ML-% IJ SOLN
INTRAMUSCULAR | Status: AC
  Filled 2016-06-09: qty 1000

## 2016-06-09 MED ORDER — METHYLPREDNISOLONE SODIUM SUCC 125 MG IJ SOLR: 125.0000 mg | INTRAMUSCULAR | Status: DC | PRN

## 2016-06-09 MED ORDER — FENTANYL CITRATE (PF) 100 MCG/2ML IJ SOLN
INTRAMUSCULAR | Status: AC
  Filled 2016-06-09: qty 2

## 2016-06-09 MED ORDER — MIDAZOLAM HCL 2 MG/2ML IJ SOLN
INTRAMUSCULAR | Status: DC | PRN
  Administered 2016-06-09 (×2): 1 mg via INTRAVENOUS
  Administered 2016-06-09: 2 mg via INTRAVENOUS
  Administered 2016-06-09: 1 mg via INTRAVENOUS

## 2016-06-09 MED ORDER — MIDAZOLAM HCL 5 MG/5ML IJ SOLN
INTRAMUSCULAR | Status: AC
  Filled 2016-06-09: qty 5

## 2016-06-09 MED ORDER — POLYETHYLENE GLYCOL 3350 17 G PO PACK: 17.0000 g | PACK | Freq: Every day | ORAL | Status: DC | PRN

## 2016-06-09 MED ORDER — PREGABALIN 25 MG PO CAPS
25.0000 mg | ORAL_CAPSULE | Freq: Two times a day (BID) | ORAL | Status: DC
  Administered 2016-06-09 – 2016-06-10 (×2): 25 mg via ORAL
  Filled 2016-06-09 (×2): qty 1

## 2016-06-09 MED ORDER — SUCRALFATE 1 GM/10ML PO SUSP
1.0000 g | Freq: Three times a day (TID) | ORAL | Status: DC
  Administered 2016-06-10: 1 g via ORAL
  Filled 2016-06-09 (×7): qty 10

## 2016-06-09 MED ORDER — LABETALOL HCL 5 MG/ML IV SOLN
INTRAVENOUS | Status: AC
  Filled 2016-06-09: qty 4

## 2016-06-09 MED ORDER — FENTANYL CITRATE (PF) 100 MCG/2ML IJ SOLN
INTRAMUSCULAR | Status: DC | PRN
  Administered 2016-06-09: 50 ug via INTRAVENOUS
  Administered 2016-06-09 (×3): 25 ug via INTRAVENOUS

## 2016-06-09 MED ORDER — ALENDRONATE SODIUM 10 MG PO TABS: 10.0000 mg | ORAL_TABLET | Freq: Every day | ORAL | Status: DC

## 2016-06-09 MED ORDER — ASPIRIN EC 81 MG PO TBEC
81.0000 mg | DELAYED_RELEASE_TABLET | Freq: Every day | ORAL | Status: DC
  Administered 2016-06-09 – 2016-06-10 (×2): 81 mg via ORAL
  Filled 2016-06-09 (×2): qty 1

## 2016-06-09 MED ORDER — CLOPIDOGREL BISULFATE 75 MG PO TABS
75.0000 mg | ORAL_TABLET | Freq: Every day | ORAL | Status: DC
  Administered 2016-06-09 – 2016-06-10 (×2): 75 mg via ORAL
  Filled 2016-06-09 (×2): qty 1

## 2016-06-09 MED ORDER — GUAIFENESIN-DM 100-10 MG/5ML PO SYRP
15.0000 mL | ORAL_SOLUTION | ORAL | Status: DC | PRN
Start: 2016-06-09 — End: 2016-06-10

## 2016-06-09 MED ORDER — IOPAMIDOL (ISOVUE-300) INJECTION 61%
INTRAVENOUS | Status: DC | PRN
  Administered 2016-06-09: 80 mL via INTRA_ARTERIAL

## 2016-06-09 MED ORDER — DIPHENHYDRAMINE HCL 50 MG/ML IJ SOLN
INTRAMUSCULAR | Status: AC
  Filled 2016-06-09: qty 1

## 2016-06-09 MED ORDER — CARVEDILOL 6.25 MG PO TABS: 6.2500 mg | ORAL_TABLET | Freq: Two times a day (BID) | ORAL | Status: DC

## 2016-06-09 MED ORDER — AMLODIPINE BESYLATE 10 MG PO TABS
10.0000 mg | ORAL_TABLET | Freq: Every day | ORAL | Status: DC
  Administered 2016-06-09: 10 mg via ORAL
  Filled 2016-06-09: qty 1

## 2016-06-09 MED ORDER — HEPARIN SODIUM (PORCINE) 1000 UNIT/ML IJ SOLN
INTRAMUSCULAR | Status: AC
  Filled 2016-06-09: qty 1

## 2016-06-09 MED ORDER — HYDRALAZINE HCL 20 MG/ML IJ SOLN
INTRAMUSCULAR | Status: AC
  Filled 2016-06-09: qty 1

## 2016-06-09 MED ORDER — POTASSIUM CHLORIDE CRYS ER 20 MEQ PO TBCR: 20.0000 meq | EXTENDED_RELEASE_TABLET | Freq: Once | ORAL | Status: DC

## 2016-06-09 MED ORDER — HYDROMORPHONE HCL 1 MG/ML IJ SOLN: 1.0000 mg | Freq: Once | INTRAMUSCULAR | Status: DC

## 2016-06-09 MED ORDER — HYDRALAZINE HCL 20 MG/ML IJ SOLN: 5.0000 mg | INTRAMUSCULAR | Status: DC | PRN

## 2016-06-09 MED ORDER — ONDANSETRON HCL 4 MG/2ML IJ SOLN
4.0000 mg | Freq: Four times a day (QID) | INTRAMUSCULAR | Status: DC | PRN
  Administered 2016-06-09: 4 mg via INTRAVENOUS

## 2016-06-09 MED ORDER — LIDOCAINE-EPINEPHRINE (PF) 2 %-1:200000 IJ SOLN
INTRAMUSCULAR | Status: AC
  Filled 2016-06-09: qty 20

## 2016-06-09 MED ORDER — PANTOPRAZOLE SODIUM 40 MG PO TBEC: 40.0000 mg | DELAYED_RELEASE_TABLET | Freq: Every day | ORAL | Status: DC

## 2016-06-09 MED ORDER — CINACALCET HCL 30 MG PO TABS
60.0000 mg | ORAL_TABLET | Freq: Every day | ORAL | Status: DC
  Administered 2016-06-09 – 2016-06-10 (×2): 60 mg via ORAL
  Filled 2016-06-09 (×2): qty 2

## 2016-06-09 MED ORDER — SODIUM CHLORIDE 0.9 % IV SOLN
INTRAVENOUS | Status: DC
  Administered 2016-06-09: 11:00:00 via INTRAVENOUS

## 2016-06-09 MED ORDER — LOSARTAN POTASSIUM 50 MG PO TABS
100.0000 mg | ORAL_TABLET | Freq: Every day | ORAL | Status: DC
  Administered 2016-06-09: 100 mg via ORAL
  Filled 2016-06-09: qty 2

## 2016-06-09 SURGICAL SUPPLY — 41 items
BALLN ARMADA 9X20X135 (BALLOONS) ×3
BALLN DORADO 6X200X135 (BALLOONS) ×3
BALLN LUTONIX 4X150X130 (BALLOONS) ×3
BALLN LUTONIX DCB 6X60X130 (BALLOONS) ×3
BALLN LUTONIX DCB 7X60X130 (BALLOONS) ×3
BALLN ULTRVRSE 3X300X150 (BALLOONS) ×2
BALLN ULTRVRSE 3X300X150 OTW (BALLOONS) ×1
BALLN ULTRVRSE 5X220X150 (BALLOONS) ×3
BALLN ULTRVRSE 6X300X130 (BALLOONS) ×3
BALLOON ARMADA 9X20X135 (BALLOONS) ×1 IMPLANT
BALLOON DORADO 6X200X135 (BALLOONS) ×1 IMPLANT
BALLOON LUTONIX 4X150X130 (BALLOONS) ×1 IMPLANT
BALLOON LUTONIX DCB 6X60X130 (BALLOONS) ×1 IMPLANT
BALLOON LUTONIX DCB 7X60X130 (BALLOONS) ×1 IMPLANT
BALLOON ULTRVRSE 3X300X150 OTW (BALLOONS) ×1 IMPLANT
BALLOON ULTRVRSE 5X220X150 (BALLOONS) ×1 IMPLANT
BALLOON ULTRVRSE 6X300X130 (BALLOONS) ×1 IMPLANT
CANNULA 5F STIFF (CANNULA) ×3 IMPLANT
CATH CXI 4F 90 DAV (CATHETERS) ×3 IMPLANT
CATH CXI SUPP ST 2.6FR 150CM (CATHETERS) ×3 IMPLANT
CATH PIG 70CM (CATHETERS) ×3 IMPLANT
CATH RIM 65CM (CATHETERS) ×3 IMPLANT
CATH VERT 100CM (CATHETERS) ×3 IMPLANT
DEVICE PRESTO INFLATION (MISCELLANEOUS) ×3 IMPLANT
DEVICE SAFEGUARD 24CM (GAUZE/BANDAGES/DRESSINGS) ×3 IMPLANT
DEVICE STARCLOSE SE CLOSURE (Vascular Products) ×3 IMPLANT
DEVICE TORQUE (MISCELLANEOUS) ×3 IMPLANT
GLIDEWIRE ADV .035X260CM (WIRE) ×3 IMPLANT
GUIDEWIRE PFTE-COATED .018X300 (WIRE) ×3 IMPLANT
PACK ANGIOGRAPHY (CUSTOM PROCEDURE TRAY) ×3 IMPLANT
SHEATH ANL2 6FRX45 HC (SHEATH) ×3 IMPLANT
SHEATH BRITE TIP 5FRX11 (SHEATH) ×3 IMPLANT
SHIELD RADPAD SCOOP 12X17 (MISCELLANEOUS) ×3 IMPLANT
STENT OMNILINK ELITE 8X39X80 (Permanent Stent) ×3 IMPLANT
STENT VIABAHN 6X100X120 (Permanent Stent) ×3 IMPLANT
STENT VIABAHN 6X150X120 (Permanent Stent) ×6 IMPLANT
STENT VIABAHN 6X250X120 (Permanent Stent) IMPLANT
SYR MEDRAD MARK V 150ML (SYRINGE) ×3 IMPLANT
TUBING CONTRAST HIGH PRESS 72 (TUBING) ×3 IMPLANT
WIRE HI TORQ STEELCORE18 300CM (WIRE) ×6 IMPLANT
WIRE J 3MM .035X145CM (WIRE) ×3 IMPLANT

## 2016-06-09 NOTE — H&P (Signed)
[facility] History & Physical Update  The patient was interviewed and re-examined.  The patient's previous History and Physical has been reviewed and is unchanged.  There is no change in the plan of care. We plan to proceed with the scheduled procedure.  Leotis Pain, MD  06/09/2016, 10:54 AM

## 2016-06-09 NOTE — Progress Notes (Signed)
Pt stable . Report called. Hematoma unchanged. Pad intact not drainage noted. Pt tol procedure well. Pt to floor.

## 2016-06-09 NOTE — Progress Notes (Signed)
Pt to recovery room at [address]. Pt has PAD device to left groin. Stick site clean and dry no oozing noted. Noted large hematoma under insertion site. Borders of hematoma marked with marker for reference. Pt reminded not to move left leg. Pt very anxious and emotional support given as needed. Pt repositoned in bed for comfort and to protect left leg from movement.

## 2016-06-09 NOTE — Progress Notes (Signed)
Pharmacy note: Alendronate d/c while admitted per P&T policy. Please reorder with home medications upon patient discharge.

## 2016-06-09 NOTE — Op Note (Signed)
[facility] Percutaneous Study/Intervention Procedural Note   Date of Surgery: 06/09/2016  Surgeon(s):Dontario Evetts   Assistants:none  Pre-operative Diagnosis: PAD with rest pain right lower extremity  Post-operative diagnosis: Same  Procedure(s) Performed: 1. Ultrasound guidance for vascular access left femoral artery 2. Catheter placement into right peroneal artery from left femoral approach 3. Aortogram and selective right lower extremity angiogram 4. Percutaneous transluminal angioplasty of left external iliac artery with 6 mm diameter by 6 cm length Lutonix drug-coated angioplasty balloon 5. Percutaneous transluminal angioplasty of the right common iliac artery with 7 mm diameter by 6 cm length Lutonix drug-coated angioplasty balloon  6.  Percutaneous transluminal angioplasty of the right peroneal artery and tibioperoneal trunk with 3 mm diameter angioplasty balloon  7.  Percutaneous transluminal angioplasty of the entire right SFA and popliteal arteries with 4 mm diameter, 5 mm diameter, and in the SFA 6 mm diameter angioplasty balloons including one Lutonix drug-coated angioplasty balloon  8.  Viabahn stent placement to the right SFA and popliteal arteries with three 6 mm diameter stents for multiple areas of high-grade residual stenosis and dissection after angioplasty  9.  Balloon expandable stent placement to the right common iliac artery with 81m diameter by 39 mm length stent for greater than 50% residual stenosis and dissection after angioplasty 10. StarClose closure device left  femoral artery  EBL: 50 cc   Contrast: 80  cc  Fluoro Time: 25  minutes  Moderate Conscious Sedation Time: approximately 90  minutes using 5  mg of Versed and 125  mcg of Fentanyl  Indications: Patient is a 71y.o.female with an ischemic right lower extremity with severe rest pain.  this has been going on for several months. The patient is brought in for angiography for further evaluation and potential treatment. Risks and benefits are discussed and informed consent is obtained  Procedure: The patient was identified and appropriate procedural time out was performed. The patient was then placed supine on the table and prepped and draped in the usual sterile fashion.Moderate conscious sedation was administered during a face to face encounter with the patient throughout the procedure with my supervision of the RN administering medicines and monitoring the patient's vital signs, pulse oximetry, telemetry and mental status throughout from the start of the procedure until the patient was taken to the recovery room. Ultrasound was used to evaluate the left  common femoral artery. It was patent . A digital ultrasound image was acquired. A Seldinger needle was used to access the left  common femoral artery under direct ultrasound guidance and a permanent image was performed. A 0.035 J wire was advanced without resistance and a 5Fr sheath was placed. Pigtail catheter was placed into the aorta and an AP aortogram was performed. The aorta was highly calcified but not stenotic. There was minimal renal artery flow bilaterally consistent with her Sgroi-standing end-stage renal disease. The left common iliac artery was tortuous and calcific but not stenotic. The proximal left external iliac artery had a high-grade stenosis about 80-85%. The right common iliac artery had about an 80% stenosis about 1-2 cm beyond its origin. The right external iliac artery had about a 40-50% stenosis. All vessels were highly calcific.  I then crossed the aortic bifurcation and advanced to the right  femoral head. Selective right  lower extremity angiogram was then performed. This demonstrated occlusion of the SFA a few centimeters beyond its origin with reconstitution of a diseased popliteal artery just above the knee.  There was  then disease in the tibioperoneal trunk and proximal peroneal artery creating at least a 75-80% stenosis in the peroneal artery was the only runoff distally. The SFA and popliteal arteries were extremely calcified. The patient was systemically heparinized and I started by treating the iliac disease. The left external iliac artery stenosis, a 6 mm diameter by 6 cm length Lutonix drug-coated angioplasty balloon was selected. This was inflated to 10 atm for 1 minute. Completion angiogram showed only about a 20% residual stenosis. I then turned my attention to the right common iliac artery. A 7 mm diameter by 6 cm length Lutonix drug-coated angioplasty balloon was inflated to 12 atm for 1 minute. Completion angiogram showed a greater than 50% residual stenosis with some local dissection, and I would place a stent at this location on the way out. I then turned my attention to the right leg for the infrainguinal disease and a 6 [place] Ansell sheath was then placed over the [employer] wire. I then used a Kumpe catheter and the advantage wire to navigate into the SFA occlusion. this was an exceptionally calcified vessel and the Kumpe catheter would not track. I was able to get the wire down into the popliteal artery and then exchanged for a 0.035 CXI catheter. This also became hung up in the popliteal artery at the reentry point. I exchanged for a 0.018 advantage wire. Through the larger CXI catheter the 150 cm length 0.018 CXI catheter was telescoped over the 0.018 advantage wire. With this, I was able to advance through the SFA and popliteal occlusions and confirm intraluminal flow in the below-knee popliteal artery. I then navigated this catheter and wire down through the tibioperoneal trunk and proximal peroneal stenosis and confirm intraluminal flow in the peroneal artery in the mid segment. I then placed a 0.018 steel core wire for more firm purchase and proceeded with treatment. A 3 mm diameter by 30  cm length angioplasty balloon was selected for the peroneal artery and tibioperoneal trunk and also used to predilate the SFA and popliteal lesions. In the peroneal artery and tibioperoneal trunk it was inflated to 10 atm for 1 minute. For the more proximal disease was taken up to burst pressure. The TP trunk and peroneal artery now had a 30-40% residual stenosis which was not flow limiting. We turned our attention then to the SFA and popliteal vessels. We had difficulty getting the larger balloons to track and we sequentially upsized from a 4 mm to a 5 mm to a 6 mm balloons for the SFA and used the 4 and 5 mm balloons for the popliteal artery. A 4 mm diameter by 15 cm length Lutonix drug-coated angioplasty balloon was used at the proximal entry point in the proximal SFA. I needed to use a 0.018 balloon for the majority of the rest of the treatment for crossing profile. Every inflation was somewhere between 8 and 12 atm for a minute, but after angioplasty of this entire segment still had a Modisette spiral dissection with multiple areas of high-grade residual stenosis. This was not entirely surprising given the extensive calcification throughout her SFA and popliteal arteries. I then proceeded with Viabahn stent placement in the SFA and popliteal arteries. The distal extent went down to just above the knee with the proximal stent being in the proximal SFA about 3-4 cm beyond the origin of the SFA. This encompassed the areas of stenosis. I had difficulty getting the Heiland Viabahn stent to track, so used to 15 cm length  stents and one 10 cm length stent. These were postdilated with 6 mm balloons including a high pressure balloon due to the dense calcification in the proximal portion of the SFA. Completion angiogram following stent placement showed no greater than 20% residual stenosis throughout the SFA or popliteal arteries with brisk flow remaining through the peroneal artery. I then pulled the sheath back to the aortic  bifurcation and perform stent placement of the right common iliac artery. An 8 mm diameter by 39 mm length balloon expandable stent was deployed in the right common iliac artery just beyond its origin terminating about a centimeter beyond the lesion. I used a 9 mm balloon to balloon the proximal edge and only about a 20-25% residual stenosis was identified after stent placement and balloon dilatation.  I elected to terminate the procedure. The sheath was removed and StarClose closure device was deployed in the left  femoral artery with excellent hemostatic result. The patient was taken to the recovery room in stable condition having tolerated the procedure well.  Findings:  Aortogram: The aorta was highly calcified but not stenotic. There was minimal renal artery flow bilaterally consistent with her Fruge-standing end-stage renal disease. The left common iliac artery was tortuous and calcific but not stenotic. The proximal left external iliac artery had a high-grade stenosis about 80-85%. The right common iliac artery had about an 80% stenosis about 1-2 cm beyond its origin. The right external iliac artery had about a 40-50% stenosis. All vessels were highly calcific. Right  Lower Extremity: This demonstrated occlusion of the SFA a few centimeters beyond its origin with reconstitution of a diseased popliteal artery just above the knee. There was then disease in the tibioperoneal trunk and proximal peroneal artery creating at least a 75-80% stenosis in the peroneal artery was the only runoff distally. The SFA and popliteal arteries were extremely calcified.   Disposition: Patient was taken to the recovery room in stable condition having tolerated the procedure well.  Complications: None  Leotis Pain 06/09/2016 6:08 PM   This note was created with Dragon Medical transcription system. Any errors in dictation are purely unintentional.

## 2016-06-09 NOTE — H&P (Signed)
[facility] History & Physical Update  The patient was interviewed and re-examined.  The patient's previous History and Physical has been reviewed and is unchanged.  There is no change in the plan of care. We plan to proceed with the scheduled procedure.  Leotis Pain, MD  06/09/2016, 8:54 AM

## 2016-06-10 ENCOUNTER — Encounter: Payer: Self-pay | Admitting: Vascular Surgery

## 2016-06-10 DIAGNOSIS — I70228 Atherosclerosis of native arteries of extremities with rest pain, other extremity: Secondary | ICD-10-CM | POA: Diagnosis not present

## 2016-06-10 DIAGNOSIS — I70221 Atherosclerosis of native arteries of extremities with rest pain, right leg: Secondary | ICD-10-CM

## 2016-06-10 LAB — CBC
HEMATOCRIT: 23.2 % — AB (ref 35.0–47.0)
HEMOGLOBIN: 7.7 g/dL — AB (ref 12.0–16.0)
MCH: 31.9 pg (ref 26.0–34.0)
MCHC: 33.4 g/dL (ref 32.0–36.0)
MCV: 95.5 fL (ref 80.0–100.0)
Platelets: 149 10*3/uL — ABNORMAL LOW (ref 150–440)
RBC: 2.43 MIL/uL — ABNORMAL LOW (ref 3.80–5.20)
RDW: 16.8 % — ABNORMAL HIGH (ref 11.5–14.5)
WBC: 6.8 10*3/uL (ref 3.6–11.0)

## 2016-06-10 LAB — PHOSPHORUS: PHOSPHORUS: 3.7 mg/dL (ref 2.5–4.6)

## 2016-06-10 LAB — BASIC METABOLIC PANEL
Anion gap: 10 (ref 5–15)
BUN: 40 mg/dL — AB (ref 6–20)
CHLORIDE: 101 mmol/L (ref 101–111)
CO2: 25 mmol/L (ref 22–32)
CREATININE: 7.26 mg/dL — AB (ref 0.44–1.00)
Calcium: 7.8 mg/dL — ABNORMAL LOW (ref 8.9–10.3)
GFR calc non Af Amer: 5 mL/min — ABNORMAL LOW (ref >60)
GFR, EST AFRICAN AMERICAN: 6 mL/min — AB (ref >60)
Glucose, Bld: 89 mg/dL (ref 65–99)
Potassium: 4.3 mmol/L (ref 3.5–5.1)
Sodium: 136 mmol/L (ref 135–145)

## 2016-06-10 MED ORDER — ASPIRIN 81 MG PO TBEC: 81.0000 mg | DELAYED_RELEASE_TABLET | Freq: Every day | ORAL | 3 refills | Status: DC

## 2016-06-10 MED ORDER — TRAMADOL HCL 50 MG PO TABS: 50.0000 mg | ORAL_TABLET | Freq: Four times a day (QID) | ORAL | 0 refills | Status: DC | PRN

## 2016-06-10 MED ORDER — EPOETIN ALFA 10000 UNIT/ML IJ SOLN
10000.0000 [IU] | INTRAMUSCULAR | Status: DC
Start: 2016-06-10 — End: 2016-06-10
  Administered 2016-06-10: 10000 [IU] via INTRAVENOUS

## 2016-06-10 MED ORDER — SODIUM CHLORIDE 0.9% FLUSH
3.0000 mL | INTRAVENOUS | Status: DC | PRN
  Administered 2016-06-10 (×2): 3 mL via INTRAVENOUS
  Filled 2016-06-10: qty 3

## 2016-06-10 MED ORDER — SODIUM CHLORIDE 0.9% FLUSH
3.0000 mL | Freq: Two times a day (BID) | INTRAVENOUS | Status: DC
  Administered 2016-06-10: 3 mL via INTRAVENOUS

## 2016-06-10 MED ORDER — CLOPIDOGREL BISULFATE 75 MG PO TABS: 75.0000 mg | ORAL_TABLET | Freq: Every day | ORAL | 5 refills | Status: DC

## 2016-06-10 NOTE — Plan of Care (Signed)
Problem: Tissue Perfusion: Goal: Risk factors for ineffective tissue perfusion will decrease Outcome: Progressing Bilateral legs equally warm with doppler pulses.

## 2016-06-10 NOTE — Progress Notes (Signed)
Pre-hd tx 

## 2016-06-10 NOTE — Progress Notes (Signed)
Post hd tx 

## 2016-06-10 NOTE — Discharge Summary (Signed)
[facility]    Discharge Summary    Patient ID:  Toni Carlson MRN: [id number] DOB/AGE: 1945-11-19 71 y.o.  Admit date: 06/09/2016 Discharge date: 06/10/2016 Date of Surgery: 06/09/2016 Surgeon: Surgeon(s): Algernon Huxley, MD  Admission Diagnosis: PAD with rest pain Rt Leg   Discharge Diagnoses:  PAD with rest pain Rt Leg   Secondary Diagnoses: Past Medical History:  Diagnosis Date  . A-fib (HCC)    a. on warfarin  . Anemia   . Carotid artery stenosis    a. ultrasound 03/2015: RICA 15-40% stenosis, LICA less than 08% stenosis  . Chronic diastolic CHF (congestive heart failure) ([place])    a. echo 03/2015: EF 60-65%, normal wall motion, diastolic parameters were c/w restrictive physiology, indicative of decreased LV diastolic compliance and/or increased LA pressure, mild AS, mild MR, left atrium was severely dilated, RA was severely dilated, PASP was severely increased at 90 mm Hg  . Coronary artery disease, non-occlusive    a. cardiac cath 2013  . ESRD (end stage renal disease) ([place])    On Tue-Thur-Sat dialysis  . H/O ischemic right MCA stroke    a. 03/2015; b. residual left-sided facial droop and slurred speech  . Hyperparathyroidism, secondary renal ([place])   . Hypertension   . Nicotine dependence   . Pulmonary HTN    as above  . Symptomatic bradycardia    a. history of symptomatic bradycardia; b. felt to be 2/2 metabolic abnormalities & required temp wire but no PPM, resolved with HD    Procedure(s): Lower Extremity Angiography  Discharged Condition: good  HPI:  Patient brought in for treatment of PAD with rest pain on the right leg  Hospital Course:  Toni Carlson is a 71 y.o. female is S/P Right Procedure(s): Lower Extremity Angiography Extubated: NA Physical exam: foot warm, doppler signals present Post-op wounds healing well Pt. Ambulating, voiding and taking PO diet without difficulty. Pt pain controlled with PO pain meds. Labs  as below Complications:none  Consults:  Treatment Team:  Hillary Bow, MD Murlean Iba, MD  Significant Diagnostic Studies: CBC Lab Results  Component Value Date   WBC 6.8 06/10/2016   HGB 7.7 (L) 06/10/2016   HCT 23.2 (L) 06/10/2016   MCV 95.5 06/10/2016   PLT 149 (L) 06/10/2016    BMET    Component Value Date/Time   NA 136 06/10/2016 0518   NA 137 11/26/2013 0650   K 4.3 06/10/2016 0518   K 5.2 (H) 11/26/2013 0650   CL 101 06/10/2016 0518   CL 102 11/26/2013 0650   CO2 25 06/10/2016 0518   CO2 26 11/26/2013 0650   GLUCOSE 89 06/10/2016 0518   GLUCOSE 87 11/26/2013 0650   BUN 40 (H) 06/10/2016 0518   BUN 52 (H) 11/26/2013 0650   CREATININE 7.26 (H) 06/10/2016 0518   CREATININE 9.20 (H) 11/26/2013 0650   CALCIUM 7.8 (L) 06/10/2016 0518   CALCIUM 8.8 11/26/2013 0650   GFRNONAA 5 (L) 06/10/2016 0518   GFRNONAA 4 (L) 11/26/2013 0650   GFRAA 6 (L) 06/10/2016 0518   GFRAA 5 (L) 11/26/2013 0650   COAG Lab Results  Component Value Date   INR 0.98 05/12/2016   INR 1.14 07/15/2015   INR 1.10 07/08/2015     Disposition:  Discharge to :Home Discharge Instructions    Call MD for:  redness, tenderness, or signs of infection (pain, swelling, bleeding, redness, odor or green/yellow discharge around incision site)    Complete by:  As directed    Call MD for:  severe or increased pain, loss or decreased feeling  in affected limb(s)    Complete by:  As directed    Call MD for:  temperature >100.5    Complete by:  As directed    Driving Restrictions    Complete by:  As directed    No driving for 24 hours   Lifting restrictions    Complete by:  As directed    No lifting for 48 hours   No dressing needed    Complete by:  As directed    Replace only if drainage present   Resume previous diet    Complete by:  As directed      Allergies as of 06/10/2016   No Known Allergies     Medication List    TAKE these medications   alendronate 10 MG tablet Commonly  known as:  FOSAMAX Take 10 mg by mouth daily before breakfast. Take with a full glass of water on an empty stomach.   amLODipine 10 MG tablet Commonly known as:  NORVASC Take 1 tablet (10 mg total) by mouth daily.   aspirin 81 MG EC tablet Take 1 tablet (81 mg total) by mouth daily.   atorvastatin 40 MG tablet Commonly known as:  LIPITOR Take 1 tablet (40 mg total) by mouth daily.   calcium acetate (Phos Binder) 667 MG/5ML Soln Commonly known as:  PHOSLYRA Take 10 mLs (1,334 mg total) by mouth 3 (three) times daily with meals.   carvedilol 6.25 MG tablet Commonly known as:  COREG Take 1 tablet (6.25 mg total) by mouth 2 (two) times daily with a meal.   cinacalcet 30 MG tablet Commonly known as:  SENSIPAR Take 2 tablets (60 mg total) by mouth daily.   clopidogrel 75 MG tablet Commonly known as:  PLAVIX Take 1 tablet (75 mg total) by mouth daily.   hydrALAZINE 25 MG tablet Commonly known as:  APRESOLINE Take 1 tablet (25 mg total) by mouth 3 (three) times daily.   losartan 100 MG tablet Commonly known as:  COZAAR Take 1 tablet (100 mg total) by mouth daily.   pantoprazole 40 MG tablet Commonly known as:  PROTONIX Take 1 tablet (40 mg total) by mouth 2 (two) times daily.   polyethylene glycol packet Commonly known as:  MIRALAX / GLYCOLAX Take 17 g by mouth daily as needed for mild constipation.   pregabalin 25 MG capsule Commonly known as:  LYRICA Take 1 capsule (25 mg total) by mouth 2 (two) times daily.   sevelamer carbonate 800 MG tablet Commonly known as:  RENVELA Take 800 mg by mouth 3 (three) times daily with meals.   sucralfate 1 GM/10ML suspension Commonly known as:  CARAFATE Take 1 g by mouth 4 (four) times daily -  with meals and at bedtime.   traMADol 50 MG tablet Commonly known as:  ULTRAM Take 1 tablet (50 mg total) by mouth every 6 (six) hours as needed.   traZODone 50 MG tablet Commonly known as:  DESYREL   trolamine salicylate 10 %  cream Commonly known as:  ASPERCREME Apply topically 2 (two) times daily as needed for muscle pain.   VOLTAREN 1 % Gel Generic drug:  diclofenac sodium      Verbal and written Discharge instructions given to the patient. Wound care per Discharge AVS Follow-up Information    KIMBERLY A STEGMAYER, PA-C Follow up in 3 week(s).   Specialty:  Physician Assistant  Why:  with ABIs Contact information: [place] [address] [phone]           Signed: Leotis Pain, MD  06/10/2016, 8:52 AM

## 2016-06-10 NOTE — Progress Notes (Signed)
Toni Carlson responded to an OR for prayer. Pt presented in some pain from Toni Carlson procedure the previous day. Pt also had feelings of remorse from activities of the past and loneliness. Pt stated that "God has gone with me a [place] way!" and that Toni Carlson faith is what will "see Toni Carlson through". Pt asked for prayer to take away Toni Carlson pain, and for Toni Carlson family. CH added to the prayer for healing of Toni Carlson loneliness. CH is available for follow up as needed.    06/10/16 0800  Clinical Encounter Type  Visited With Patient  Visit Type Initial;Spiritual support  Referral From Nurse  Spiritual Encounters  Spiritual Needs Prayer

## 2016-06-10 NOTE — Progress Notes (Signed)
Post hemodialysis treatment.Completed 3.5hours of treatment with no fluid removal per orders.Received prn pain medication during treatment for complaints of foot pain.Hemastasis achieved,sites dressed with gauze/taped.

## 2016-06-10 NOTE — Progress Notes (Signed)
Pain medication given for foot pain during dialysis treatment.

## 2016-06-10 NOTE — Care Management (Addendum)
Patient  had planned elective outpatient procedure without complications. Notified Cheryl with [facility] of observation status and known dialysis days of T TH Sat but patient not available at present to provide name of the clinic.  Faxed H/P, Acct number and demographics via Epic

## 2016-06-10 NOTE — Progress Notes (Signed)
Hemodialysis completed. 

## 2016-06-10 NOTE — Consult Note (Signed)
Reason for Consult: Medical management Referring Physician: Leotis Pain, MD  Toni Carlson is an 71 y.o. female.  HPI: The patient with past medical history of end-stage renal disease on dialysis, CHF, CAD and peripheral artery disease was admitted to the hospital for revascularization of her left leg. She successfully underwent balloon angioplasty as well as stent placement to her femoral and peroneal arteries. Her pain is well managed. She has no active complaints. The hospital service was consulted for management of her chronic medical problems.  Past Medical History:  Diagnosis Date  . A-fib (HCC)    a. on warfarin  . Anemia   . Carotid artery stenosis    a. ultrasound 03/2015: RICA 56-31% stenosis, LICA less than 49% stenosis  . Chronic diastolic CHF (congestive heart failure) ([place])    a. echo 03/2015: EF 60-65%, normal wall motion, diastolic parameters were c/w restrictive physiology, indicative of decreased LV diastolic compliance and/or increased LA pressure, mild AS, mild MR, left atrium was severely dilated, RA was severely dilated, PASP was severely increased at 90 mm Hg  . Coronary artery disease, non-occlusive    a. cardiac cath 2013  . ESRD (end stage renal disease) ([place])    On Tue-Thur-Sat dialysis  . H/O ischemic right MCA stroke    a. 03/2015; b. residual left-sided facial droop and slurred speech  . Hyperparathyroidism, secondary renal ([place])   . Hypertension   . Nicotine dependence   . Pulmonary HTN    as above  . Symptomatic bradycardia    a. history of symptomatic bradycardia; b. felt to be 2/2 metabolic abnormalities & required temp wire but no PPM, resolved with HD    Past Surgical History:  Procedure Laterality Date  . ESOPHAGOGASTRODUODENOSCOPY (EGD) WITH PROPOFOL N/A 04/14/2015   Procedure: ESOPHAGOGASTRODUODENOSCOPY (EGD) WITH PROPOFOL;  Surgeon: Lucilla Lame, MD;  Location: [facility] ENDOSCOPY;  Service: Endoscopy;  Laterality: N/A;  . ESOPHAGOGASTRODUODENOSCOPY  (EGD) WITH PROPOFOL N/A 07/17/2015   Procedure: ESOPHAGOGASTRODUODENOSCOPY (EGD) WITH PROPOFOL;  Surgeon: Josefine Class, MD;  Location: [facility] ENDOSCOPY;  Service: Endoscopy;  Laterality: N/A;  . EUS N/A 09/24/2015   Procedure: UPPER ENDOSCOPIC ULTRASOUND (EUS) LINEAR;  Surgeon: Jola Schmidt, MD;  Location: [facility] ENDOSCOPY;  Service: Endoscopy;  Laterality: N/A;  . PERIPHERAL VASCULAR CATHETERIZATION N/A 02/23/2015   Procedure: A/V Shuntogram/Fistulagram;  Surgeon: Algernon Huxley, MD;  Location: [place] [facility] LAB;  Service: Cardiovascular;  Laterality: N/A;  . PERIPHERAL VASCULAR CATHETERIZATION N/A 02/23/2015   Procedure: A/V Shunt Intervention;  Surgeon: Algernon Huxley, MD;  Location: [place] [facility];  Service: Cardiovascular;  Laterality: N/A;    Family History  Problem Relation Age of Onset  . CAD Father   . Dementia Mother     Social History:  reports that she quit smoking about 2 years ago. Her smoking use included Cigarettes. She has a 7.50 pack-year smoking history. She has never used smokeless tobacco. She reports that she does not drink alcohol or use drugs.  Allergies: No Known Allergies  Medications:  I have reviewed the patient's current medications. Prior to Admission:  Prescriptions Prior to Admission  Medication Sig Dispense Refill Last Dose  . alendronate (FOSAMAX) 10 MG tablet Take 10 mg by mouth daily before breakfast. Take with a full glass of water on an empty stomach.   06/09/2016 at Unknown time  . amLODipine (NORVASC) 10 MG tablet Take 1 tablet (10 mg total) by mouth daily. 30 tablet 0 06/09/2016 at Unknown time  . atorvastatin (  LIPITOR) 40 MG tablet Take 1 tablet (40 mg total) by mouth daily. 30 tablet 0 06/09/2016 at Unknown time  . calcium acetate, Phos Binder, (PHOSLYRA) 667 MG/5ML SOLN Take 10 mLs (1,334 mg total) by mouth 3 (three) times daily with meals. 450 mL 1 06/09/2016 at Unknown time  . carvedilol (COREG) 6.25 MG tablet Take 1 tablet (6.25 mg total) by  mouth 2 (two) times daily with a meal. 60 tablet 0 06/09/2016 at Unknown time  . cinacalcet (SENSIPAR) 30 MG tablet Take 2 tablets (60 mg total) by mouth daily. 60 tablet 0 06/09/2016 at Unknown time  . hydrALAZINE (APRESOLINE) 25 MG tablet Take 1 tablet (25 mg total) by mouth 3 (three) times daily. 90 tablet 0 06/09/2016 at Unknown time  . losartan (COZAAR) 100 MG tablet Take 1 tablet (100 mg total) by mouth daily. 30 tablet 0 06/09/2016 at Unknown time  . pantoprazole (PROTONIX) 40 MG tablet Take 1 tablet (40 mg total) by mouth 2 (two) times daily. 60 tablet 0 06/09/2016 at Unknown time  . pregabalin (LYRICA) 25 MG capsule Take 1 capsule (25 mg total) by mouth 2 (two) times daily. 60 capsule 0 06/09/2016 at Unknown time  . polyethylene glycol (MIRALAX / GLYCOLAX) packet Take 17 g by mouth daily as needed for mild constipation. 14 each 0 Taking  . sevelamer carbonate (RENVELA) 800 MG tablet Take 800 mg by mouth 3 (three) times daily with meals.   Not Taking at Unknown time  . sucralfate (CARAFATE) 1 GM/10ML suspension Take 1 g by mouth 4 (four) times daily -  with meals and at bedtime.   Not Taking at Unknown time  . traZODone (DESYREL) 50 MG tablet    Not Taking at Unknown time  . trolamine salicylate (ASPERCREME) 10 % cream Apply topically 2 (two) times daily as needed for muscle pain. 85 g 0 Taking  . VOLTAREN 1 % GEL      . [DISCONTINUED] feeding supplement, ENSURE COMPLETE, (ENSURE COMPLETE) LIQD Take 237 mLs by mouth 3 (three) times daily with meals. (Patient not taking: Reported on 06/09/2016) 90 Bottle 6 Not Taking at Unknown time  . [DISCONTINUED] ondansetron (ZOFRAN ODT) 4 MG disintegrating tablet Take 1 tablet (4 mg total) by mouth every 8 (eight) hours as needed for nausea or vomiting. (Patient not taking: Reported on 06/09/2016) 20 tablet 0 Not Taking at Unknown time   Scheduled: . amLODipine  10 mg Oral Daily  . aspirin EC  81 mg Oral Daily  . atorvastatin  40 mg Oral Daily  . calcium  acetate (Phos Binder)  1,334 mg Oral TID WC  . carvedilol  6.25 mg Oral BID WC  . cinacalcet  60 mg Oral Daily  . clopidogrel  75 mg Oral Daily  . hydrALAZINE  25 mg Oral TID  . losartan  100 mg Oral Daily  . pantoprazole  40 mg Oral BID  . potassium chloride  20-40 mEq Oral Once  . pregabalin  25 mg Oral BID  . sevelamer carbonate  800 mg Oral TID WC  . sodium chloride flush  3 mL Intravenous Q12H  . sucralfate  1 g Oral TID WC & HS  . traZODone  50 mg Oral QHS   Continuous:   Results for orders placed or performed during the hospital encounter of 06/09/16 (from the past 48 hour(s))  Potassium [facility] vascular lab only)     Status: None   Collection Time: 06/09/16 10:40 AM  Result Value Ref Range  Potassium [facility] vascular lab) 3.8 3.5 - 5.1  CBC     Status: Abnormal   Collection Time: 06/10/16  5:18 AM  Result Value Ref Range   WBC 6.8 3.6 - 11.0 K/uL   RBC 2.43 (L) 3.80 - 5.20 MIL/uL   Hemoglobin 7.7 (L) 12.0 - 16.0 g/dL   HCT 23.2 (L) 35.0 - 47.0 %   MCV 95.5 80.0 - 100.0 fL   MCH 31.9 26.0 - 34.0 pg   MCHC 33.4 32.0 - 36.0 g/dL   RDW 16.8 (H) 11.5 - 14.5 %   Platelets 149 (L) 150 - 440 K/uL  Basic metabolic panel     Status: Abnormal   Collection Time: 06/10/16  5:18 AM  Result Value Ref Range   Sodium 136 135 - 145 mmol/L   Potassium 4.3 3.5 - 5.1 mmol/L   Chloride 101 101 - 111 mmol/L   CO2 25 22 - 32 mmol/L   Glucose, Bld 89 65 - 99 mg/dL   BUN 40 (H) 6 - 20 mg/dL   Creatinine, Ser 7.26 (H) 0.44 - 1.00 mg/dL   Calcium 7.8 (L) 8.9 - 10.3 mg/dL   GFR calc non Af Amer 5 (L) >60 mL/min   GFR calc Af Amer 6 (L) >60 mL/min    Comment: (NOTE) The eGFR has been calculated using the CKD EPI equation. This calculation has not been validated in all clinical situations. eGFR's persistently <60 mL/min signify possible Chronic Kidney Disease.    Anion gap 10 5 - 15    No results found.  Review of Systems  Constitutional: Negative for chills and fever.  HENT:  Negative for sore throat and tinnitus.   Eyes: Negative for blurred vision and redness.  Respiratory: Negative for cough and shortness of breath.   Cardiovascular: Negative for chest pain, palpitations, orthopnea and PND.  Gastrointestinal: Negative for abdominal pain, diarrhea, nausea and vomiting.  Genitourinary: Negative for dysuria, frequency and urgency.  Musculoskeletal: Negative for joint pain and myalgias.  Skin: Negative for rash.       No lesions  Neurological: Negative for speech change, focal weakness and weakness.  Endo/Heme/Allergies: Does not bruise/bleed easily.       No temperature intolerance  Psychiatric/Behavioral: Negative for depression and suicidal ideas.   Blood pressure (!) 94/37, pulse 60, temperature 99.2 F (37.3 C), temperature source Oral, resp. rate 18, height '5\' 6"'  (1.676 m), weight 68.4 kg (150 lb 14.4 oz), SpO2 (!) 72 %. Physical Exam  Vitals reviewed. Constitutional: She is oriented to person, place, and time. She appears well-developed and well-nourished. No distress.  HENT:  Head: Normocephalic and atraumatic.  Mouth/Throat: Oropharynx is clear and moist.  Eyes: Conjunctivae and EOM are normal. Pupils are equal, round, and reactive to light. No scleral icterus.  Neck: Normal range of motion. Neck supple. No JVD present. No tracheal deviation present. No thyromegaly present.  Cardiovascular: Normal rate, regular rhythm and normal heart sounds.  Exam reveals no gallop and no friction rub.   No murmur heard. Respiratory: Effort normal and breath sounds normal.  GI: Soft. Bowel sounds are normal. She exhibits no distension. There is no tenderness.  Genitourinary:  Genitourinary Comments: Deferred  Musculoskeletal: Normal range of motion. She exhibits no edema.  Lymphadenopathy:    She has no cervical adenopathy.  Neurological: She is alert and oriented to person, place, and time. No cranial nerve deficit. She exhibits normal muscle tone.  Skin: Skin  is warm and dry. No rash noted. No  erythema.  Psychiatric: She has a normal mood and affect. Her behavior is normal. Judgment and thought content normal.    Assessment/Plan: This is a 71 year old female status post revascularization of her left leg with multiple medical problems. 1. ESRD: The patient is on dialysis Tuesday Thursday Saturday. Consult nephrology for continuation of dialysis. Continue Sensipar, Renvela as well as calcium acetate 2. CAD: Stable; continue aspirin and Plavix. 3. Hypertension: Controlled (actually relatively low). Continue amlodipine, losartan, hydralazine and metoprolol. 4. CHF: Chronic; diastolic. Continue carvedilol (note-beta blocker therapy) 5. Peripheral vascular disease: Management per surgery 6. Hyperlipidemia: Continue statin therapy 7. DVT prophylaxis: SCDs (restart heparinization per surgical service) 8. GI prophylaxis: Pantoprazole per home regimen The patient is a full code. Thank you for involving [place] in the care of this patient. We will continue to follow  Harrie Foreman 06/10/2016, 7:45 AM

## 2016-06-10 NOTE — Progress Notes (Signed)
Hemodialysis treatment started. 

## 2016-06-10 NOTE — Progress Notes (Signed)
Subjective:   Patient known to our practice from outpatient dialysis. She is admitted for angiogram of the right lower extremity. She underwent extensive angioplasty and revascularization of right lower extremity along with stent placements. Nephrology consult has been requested to evaluate for dialysis post contrast exposure   HEMODIALYSIS FLOWSHEET:  Blood Flow Rate (mL/min): 400 mL/min Arterial Pressure (mmHg): -160 mmHg Venous Pressure (mmHg): 180 mmHg Transmembrane Pressure (mmHg): 50 mmHg Ultrafiltration Rate (mL/min): 140 mL/min Dialysate Flow Rate (mL/min): 800 ml/min Conductivity: Machine : 14 Conductivity: Machine : 14 Dialysis Fluid Bolus: Normal Saline Bolus Amount (mL): 250 mL Intra-Hemodialysis Comments: 590ml...tx completed.    Objective:  Vital signs in last 24 hours:  Temp:  [97.7 F (36.5 C)-99.2 F (37.3 C)] 98.1 F (36.7 C) (01/26 1335) Pulse Rate:  [43-71] 71 (01/26 1335) Resp:  [10-18] 18 (01/26 1335) BP: (82-144)/(32-86) 121/35 (01/26 1335) SpO2:  [88 %-100 %] 91 % (01/26 1335) Weight:  [68.1 kg (150 lb 3.2 oz)-68.4 kg (150 lb 14.4 oz)] 68.4 kg (150 lb 14.4 oz) (01/26 0920)  Weight change:  Filed Weights   06/09/16 2014 06/10/16 0521 06/10/16 0920  Weight: 68.1 kg (150 lb 3.2 oz) 68.4 kg (150 lb 14.4 oz) 68.4 kg (150 lb 14.4 oz)    Intake/Output:    Intake/Output Summary (Last 24 hours) at 06/10/16 1522 Last data filed at 06/10/16 1307  Gross per 24 hour  Intake                0 ml  Output                0 ml  Net                0 ml     Physical Exam: General: No acute distress, laying in the bed   HEENT Anicteric, moist oral mucous membranes   Neck Supple   Pulm/lungs Normal breathing effort, clear to auscultation   CVS/Heart Regular, no rub or gallop   Abdomen:  Soft, nontender   Extremities: Erythema of toes of right foot is noted  Neurologic: Alert, oriented   Skin: No acute rashes   AVG        Basic Metabolic  Panel:   Recent Labs Lab 06/10/16 0518  NA 136  K 4.3  CL 101  CO2 25  GLUCOSE 89  BUN 40*  CREATININE 7.26*  CALCIUM 7.8*  PHOS 3.7     CBC:  Recent Labs Lab 06/10/16 0518  WBC 6.8  HGB 7.7*  HCT 23.2*  MCV 95.5  PLT 149*      Microbiology:  No results found for this or any previous visit (from the past 720 hour(s)).  Coagulation Studies: No results for input(s): LABPROT, INR in the last 72 hours.  Urinalysis: No results for input(s): COLORURINE, LABSPEC, PHURINE, GLUCOSEU, HGBUR, BILIRUBINUR, KETONESUR, PROTEINUR, UROBILINOGEN, NITRITE, LEUKOCYTESUR in the last 72 hours.  Invalid input(s): APPERANCEUR    Imaging: No results found.   Medications:    . aspirin EC  81 mg Oral Daily  . atorvastatin  40 mg Oral Daily  . calcium acetate (Phos Binder)  1,334 mg Oral TID WC  . carvedilol  6.25 mg Oral BID WC  . cinacalcet  60 mg Oral Daily  . clopidogrel  75 mg Oral Daily  . epoetin (EPOGEN/PROCRIT) injection  10,000 Units Intravenous Q M,W,F-HD  . pantoprazole  40 mg Oral BID  . potassium chloride  20-40 mEq Oral Once  . pregabalin  25 mg Oral BID  . sevelamer carbonate  800 mg Oral TID WC  . sodium chloride flush  3 mL Intravenous Q12H  . sucralfate  1 g Oral TID WC & HS  . traZODone  50 mg Oral QHS   alum & mag hydroxide-simeth, guaiFENesin-dextromethorphan, hydrALAZINE, HYDROmorphone (DILAUDID) injection, labetalol, metoprolol, ondansetron, phenol, polyethylene glycol, sodium chloride flush  Assessment/ Plan:  71 y.o. female with ESRD, on hemodialysis, GERD, anemia, atrial fibrillation, CVA, coronary artery disease, pulmonary hypertension, carotid artery stenosis    [place]. TTS [facility]   1.  peripheral arterial disease status post angioplasty and stent placement   2. Anemia of chronic kidney disease  3. End-stage renal disease   4. Hypertension  5. Secondary hyperparathyroidism  Patient with IV contrast exposure secondary to  recent procedure We will dialyze her today as it is her regular dialysis day IV Procrit with dialysis follow low salt, low phosphorus diet   LOS: 0 Elimelech Houseman 1/26/20183:22 PM

## 2016-06-10 NOTE — Plan of Care (Signed)
Problem: Pain Managment: Goal: General experience of comfort will improve Outcome: Progressing Right leg pain relieved with prn meds.

## 2016-06-10 NOTE — Progress Notes (Signed)
A & o. Pt reported pain and received meds. Takes meds ok. Tolerating diet well. Pulses are dopplered. Room air. A fib. Went to HD today and nothing was removed. IV and tele removed. Discharge instructions and prescriptions in packet. Pt has no further concerns at this time.

## 2016-06-10 NOTE — Care Management Obs Status (Signed)
[place] NOTIFICATION   Patient Details  Name: Desere Gwin MRN: [id number] Date of Birth: 1945-12-16   Medicare Observation Status Notification Given:  No  DC < 24 hours    Katrina Stack, RN 06/10/2016, 2:24 PM

## 2016-06-11 LAB — HEPATITIS B SURFACE ANTIBODY, QUANTITATIVE

## 2016-06-11 LAB — HEPATITIS B SURFACE ANTIGEN: HEP B S AG: NEGATIVE

## 2016-06-14 ENCOUNTER — Ambulatory Visit: Admit: 2016-06-14 | Payer: Medicare Other | Admitting: Gastroenterology

## 2016-06-14 SURGERY — COLONOSCOPY WITH PROPOFOL
Anesthesia: General

## 2016-06-15 ENCOUNTER — Telehealth: Payer: Self-pay | Admitting: Gastroenterology

## 2016-06-15 ENCOUNTER — Other Ambulatory Visit: Payer: Self-pay

## 2016-06-15 NOTE — Telephone Encounter (Signed)
Roger Kill called from the [facility] and needs to reschedule patients colonoscopy. Please call [phone]

## 2016-06-15 NOTE — Telephone Encounter (Signed)
Roger Kill called from the [facility] and needs to reschedule patients colonoscopy. Please call  [phone]

## 2016-06-15 NOTE — Telephone Encounter (Signed)
Left message with nurse because Roger Kill had already left for the day that we have rescheduled pt's colonoscopy to 06/28/16. She will have her call me back if they need me to resend paperwork.

## 2016-06-21 ENCOUNTER — Encounter: Payer: Self-pay | Admitting: Vascular Surgery

## 2016-06-23 ENCOUNTER — Other Ambulatory Visit: Payer: Self-pay

## 2016-06-27 ENCOUNTER — Encounter: Payer: Self-pay | Admitting: *Deleted

## 2016-06-28 ENCOUNTER — Encounter: Payer: Self-pay | Admitting: *Deleted

## 2016-06-28 ENCOUNTER — Ambulatory Visit
Admission: RE | Admit: 2016-06-28 | Discharge: 2016-06-28 | Disposition: A | Discharge status: Home / Self Care | Payer: Medicare Other | Source: Ambulatory Visit | Attending: Gastroenterology | Admitting: Gastroenterology

## 2016-06-28 ENCOUNTER — Encounter
Admission: RE | Disposition: A | Discharge status: Home / Self Care | Payer: Self-pay | Source: Ambulatory Visit | Attending: Gastroenterology

## 2016-06-28 DIAGNOSIS — Z539 Procedure and treatment not carried out, unspecified reason: Secondary | ICD-10-CM | POA: Diagnosis present

## 2016-06-28 SURGERY — COLONOSCOPY WITH PROPOFOL
Anesthesia: General

## 2016-07-04 ENCOUNTER — Encounter (INDEPENDENT_AMBULATORY_CARE_PROVIDER_SITE_OTHER): Payer: Medicare Other | Admitting: Vascular Surgery

## 2016-08-09 ENCOUNTER — Telehealth: Payer: Self-pay | Admitting: Gastroenterology

## 2016-08-09 NOTE — Telephone Encounter (Signed)
Ready to schedule her colonoscopy.

## 2016-08-10 ENCOUNTER — Other Ambulatory Visit: Payer: Self-pay

## 2016-08-10 DIAGNOSIS — Z1211 Encounter for screening for malignant neoplasm of colon: Secondary | ICD-10-CM

## 2016-08-10 NOTE — Telephone Encounter (Signed)
Pt rescheduled for colonoscopy at [facility] with Dr. Allen Norris on Tuesday, April 10th. Paperwork faxed to Attn: [place] at [phone].

## 2016-08-23 ENCOUNTER — Encounter: Admission: RE | Payer: Self-pay | Source: Ambulatory Visit

## 2016-08-23 ENCOUNTER — Ambulatory Visit: Admission: RE | Admit: 2016-08-23 | Payer: Medicare Other | Source: Ambulatory Visit | Admitting: Gastroenterology

## 2016-08-23 SURGERY — COLONOSCOPY WITH PROPOFOL
Anesthesia: General

## 2016-10-21 ENCOUNTER — Encounter (INDEPENDENT_AMBULATORY_CARE_PROVIDER_SITE_OTHER): Payer: Self-pay

## 2016-10-24 ENCOUNTER — Other Ambulatory Visit (INDEPENDENT_AMBULATORY_CARE_PROVIDER_SITE_OTHER): Payer: Self-pay | Admitting: Vascular Surgery

## 2016-10-24 MED ORDER — CEFAZOLIN SODIUM-DEXTROSE 1-4 GM/50ML-% IV SOLN
1.0000 g | Freq: Once | INTRAVENOUS | Status: AC
  Administered 2016-10-25: 1 g via INTRAVENOUS

## 2016-10-25 ENCOUNTER — Ambulatory Visit
Admission: RE | Admit: 2016-10-25 | Discharge: 2016-10-25 | Disposition: A | Discharge status: Nursing Home (Includes ICF) | Payer: Medicare Other | Source: Ambulatory Visit | Attending: Vascular Surgery | Admitting: Vascular Surgery

## 2016-10-25 ENCOUNTER — Encounter
Admission: RE | Disposition: A | Discharge status: Nursing Home (Includes ICF) | Payer: Self-pay | Source: Ambulatory Visit | Attending: Vascular Surgery

## 2016-10-25 ENCOUNTER — Encounter: Payer: Self-pay | Admitting: Vascular Surgery

## 2016-10-25 DIAGNOSIS — D631 Anemia in chronic kidney disease: Secondary | ICD-10-CM | POA: Insufficient documentation

## 2016-10-25 DIAGNOSIS — Z8249 Family history of ischemic heart disease and other diseases of the circulatory system: Secondary | ICD-10-CM | POA: Diagnosis not present

## 2016-10-25 DIAGNOSIS — I132 Hypertensive heart and chronic kidney disease with heart failure and with stage 5 chronic kidney disease, or end stage renal disease: Secondary | ICD-10-CM | POA: Insufficient documentation

## 2016-10-25 DIAGNOSIS — Z87891 Personal history of nicotine dependence: Secondary | ICD-10-CM | POA: Insufficient documentation

## 2016-10-25 DIAGNOSIS — I272 Pulmonary hypertension, unspecified: Secondary | ICD-10-CM | POA: Diagnosis not present

## 2016-10-25 DIAGNOSIS — T82858A Stenosis of vascular prosthetic devices, implants and grafts, initial encounter: Secondary | ICD-10-CM | POA: Insufficient documentation

## 2016-10-25 DIAGNOSIS — Z992 Dependence on renal dialysis: Secondary | ICD-10-CM | POA: Diagnosis not present

## 2016-10-25 DIAGNOSIS — I2510 Atherosclerotic heart disease of native coronary artery without angina pectoris: Secondary | ICD-10-CM | POA: Insufficient documentation

## 2016-10-25 DIAGNOSIS — N2581 Secondary hyperparathyroidism of renal origin: Secondary | ICD-10-CM | POA: Insufficient documentation

## 2016-10-25 DIAGNOSIS — Y832 Surgical operation with anastomosis, bypass or graft as the cause of abnormal reaction of the patient, or of later complication, without mention of misadventure at the time of the procedure: Secondary | ICD-10-CM | POA: Insufficient documentation

## 2016-10-25 DIAGNOSIS — I4891 Unspecified atrial fibrillation: Secondary | ICD-10-CM | POA: Diagnosis not present

## 2016-10-25 DIAGNOSIS — R001 Bradycardia, unspecified: Secondary | ICD-10-CM | POA: Insufficient documentation

## 2016-10-25 DIAGNOSIS — Z955 Presence of coronary angioplasty implant and graft: Secondary | ICD-10-CM | POA: Diagnosis not present

## 2016-10-25 DIAGNOSIS — I6523 Occlusion and stenosis of bilateral carotid arteries: Secondary | ICD-10-CM | POA: Insufficient documentation

## 2016-10-25 DIAGNOSIS — N186 End stage renal disease: Secondary | ICD-10-CM | POA: Diagnosis not present

## 2016-10-25 DIAGNOSIS — Z9889 Other specified postprocedural states: Secondary | ICD-10-CM | POA: Insufficient documentation

## 2016-10-25 DIAGNOSIS — I5032 Chronic diastolic (congestive) heart failure: Secondary | ICD-10-CM | POA: Diagnosis not present

## 2016-10-25 DIAGNOSIS — I10 Essential (primary) hypertension: Secondary | ICD-10-CM

## 2016-10-25 DIAGNOSIS — Z820 Family history of epilepsy and other diseases of the nervous system: Secondary | ICD-10-CM | POA: Diagnosis not present

## 2016-10-25 DIAGNOSIS — T82868A Thrombosis of vascular prosthetic devices, implants and grafts, initial encounter: Secondary | ICD-10-CM | POA: Diagnosis not present

## 2016-10-25 DIAGNOSIS — Z8673 Personal history of transient ischemic attack (TIA), and cerebral infarction without residual deficits: Secondary | ICD-10-CM | POA: Diagnosis not present

## 2016-10-25 HISTORY — PX: A/V FISTULAGRAM: CATH118298

## 2016-10-25 LAB — POTASSIUM (ARMC VASCULAR LAB ONLY): POTASSIUM (ARMC VASCULAR LAB): 3.8 (ref 3.5–5.1)

## 2016-10-25 SURGERY — A/V FISTULAGRAM
Anesthesia: Moderate Sedation | Site: Arm Upper | Laterality: Left

## 2016-10-25 MED ORDER — ONDANSETRON HCL 4 MG/2ML IJ SOLN: 4.0000 mg | Freq: Four times a day (QID) | INTRAMUSCULAR | Status: DC | PRN

## 2016-10-25 MED ORDER — FENTANYL CITRATE (PF) 100 MCG/2ML IJ SOLN
INTRAMUSCULAR | Status: DC | PRN
  Administered 2016-10-25: 50 ug via INTRAVENOUS
  Administered 2016-10-25 (×2): 25 ug via INTRAVENOUS

## 2016-10-25 MED ORDER — SODIUM CHLORIDE 0.9 % IV SOLN
INTRAVENOUS | Status: DC
  Administered 2016-10-25: 12:00:00 via INTRAVENOUS

## 2016-10-25 MED ORDER — MIDAZOLAM HCL 2 MG/2ML IJ SOLN
INTRAMUSCULAR | Status: DC | PRN
  Administered 2016-10-25: 1 mg via INTRAVENOUS
  Administered 2016-10-25: 2 mg via INTRAVENOUS
  Administered 2016-10-25: 1 mg via INTRAVENOUS

## 2016-10-25 MED ORDER — IOPAMIDOL (ISOVUE-300) INJECTION 61%
INTRAVENOUS | Status: DC | PRN
  Administered 2016-10-25: 50 mL via INTRA_ARTERIAL

## 2016-10-25 MED ORDER — HEPARIN SODIUM (PORCINE) 1000 UNIT/ML IJ SOLN
INTRAMUSCULAR | Status: DC | PRN
  Administered 2016-10-25: 3000 [IU] via INTRAVENOUS

## 2016-10-25 MED ORDER — LIDOCAINE HCL (PF) 1 % IJ SOLN
INTRAMUSCULAR | Status: AC
  Filled 2016-10-25: qty 10

## 2016-10-25 MED ORDER — HEPARIN SODIUM (PORCINE) 1000 UNIT/ML IJ SOLN
INTRAMUSCULAR | Status: AC
  Filled 2016-10-25: qty 1

## 2016-10-25 MED ORDER — HYDROMORPHONE HCL 1 MG/ML IJ SOLN: 1.0000 mg | Freq: Once | INTRAMUSCULAR | Status: DC | PRN

## 2016-10-25 MED ORDER — FENTANYL CITRATE (PF) 100 MCG/2ML IJ SOLN
INTRAMUSCULAR | Status: AC
  Filled 2016-10-25: qty 2

## 2016-10-25 MED ORDER — MIDAZOLAM HCL 5 MG/5ML IJ SOLN
INTRAMUSCULAR | Status: AC
  Filled 2016-10-25: qty 5

## 2016-10-25 MED ORDER — FAMOTIDINE 20 MG PO TABS: 40.0000 mg | ORAL_TABLET | ORAL | Status: DC | PRN

## 2016-10-25 MED ORDER — HEPARIN (PORCINE) IN NACL 2-0.9 UNIT/ML-% IJ SOLN
INTRAMUSCULAR | Status: AC
  Filled 2016-10-25: qty 1000

## 2016-10-25 MED ORDER — METHYLPREDNISOLONE SODIUM SUCC 125 MG IJ SOLR: 125.0000 mg | INTRAMUSCULAR | Status: DC | PRN

## 2016-10-25 SURGICAL SUPPLY — 22 items
BALLN DORADO 8X40X80 (BALLOONS) ×2
BALLN LUTONIX AV 12X40X75 (BALLOONS) ×2
BALLN ULTRASCORE 5X40X130 (BALLOONS) ×2
BALLOON DORADO 8X40X80 (BALLOONS) ×1 IMPLANT
BALLOON LUTONIX AV 12X40X75 (BALLOONS) ×1 IMPLANT
BALLOON ULTRASCORE 5X40X130 (BALLOONS) ×1 IMPLANT
CATH BEACON 5 .035 40 KMP TP (CATHETERS) ×1 IMPLANT
CATH BEACON 5 .038 40 KMP TP (CATHETERS) ×1
DEVICE PRESTO INFLATION (MISCELLANEOUS) ×2 IMPLANT
DRAPE BRACHIAL (DRAPES) ×2 IMPLANT
GUIDEWIRE ANGLED .035 180CM (WIRE) ×2 IMPLANT
NEEDLE ENTRY 21GA 7CM ECHOTIP (NEEDLE) ×2 IMPLANT
PACK ANGIOGRAPHY (CUSTOM PROCEDURE TRAY) ×2 IMPLANT
SET INTRO CAPELLA COAXIAL (SET/KITS/TRAYS/PACK) ×2 IMPLANT
SHEATH BRITE TIP 6FRX5.5 (SHEATH) ×2 IMPLANT
SHEATH BRITE TIP 7FRX11 (SHEATH) ×2 IMPLANT
SHEATH BRITE TIP 7FRX5.5 (SHEATH) ×2 IMPLANT
STENT VIABAHN 8X2.5X120 (Permanent Stent) ×1 IMPLANT
STENT VIABAHN 8X25X120 (Permanent Stent) ×1 IMPLANT
SUT MNCRL AB 4-0 PS2 18 (SUTURE) ×2 IMPLANT
TOWEL OR 17X26 4PK STRL BLUE (TOWEL DISPOSABLE) ×4 IMPLANT
WIRE G V18X300CM (WIRE) ×2 IMPLANT

## 2016-10-25 NOTE — Op Note (Signed)
OPERATIVE NOTE   PROCEDURE: 1. Contrast injection left brachiocephalic  AV access 2. Percutaneous transluminal angioplasty and stent placement peripheral segment to 8 mm 3. Percutaneous transluminal angioplasty central venous segment to 12 mm with a Lutonix drug-eluting balloon  PRE-OPERATIVE DIAGNOSIS: Complication of dialysis access                                                       End Stage Renal Disease  POST-OPERATIVE DIAGNOSIS: same as above   SURGEON: Katha Cabal, M.D.  ANESTHESIA: Conscious sedation was administered under my direct supervision by the interventional radiology RN. IV Versed plus fentanyl were utilized. Continuous ECG, pulse oximetry and blood pressure was monitored throughout the entire procedure.  Conscious sedation was for a total of 39.  ESTIMATED BLOOD LOSS: minimal  FINDING(S): Stricture of the AV graft within the peripheral segment as well as a stricture within the central venous portion  SPECIMEN(S):  None  CONTRAST: 50 cc  FLUOROSCOPY TIME: 5.6 minutes  INDICATIONS: Toni Carlson is a 71 y.o. female who  presents with malfunctioning left arm AV access.  The patient is scheduled for angiography with possible intervention of the AV access.  The patient is aware the risks include but are not limited to: bleeding, infection, thrombosis of the cannulated access, and possible anaphylactic reaction to the contrast.  The patient acknowledges if the access can not be salvaged a tunneled catheter will be needed and will be placed during this procedure.  The patient is aware of the risks of the procedure and elects to proceed with the angiogram and intervention.  DESCRIPTION: After full informed written consent was obtained, the patient was brought back to the Special Procedure suite and placed supine position.  Appropriate cardiopulmonary monitors were placed.  The left arm was prepped and draped in the standard fashion.  Appropriate timeout is  called. The left brachiocephalic fistula  was cannulated with a micropuncture needle.  Cannulation was performed with ultrasound guidance. Ultrasound was placed in a sterile sleeve, the AV access was interrogated and noted to be echolucent and compressible indicating patency. Image was recorded for the permanent record. The puncture is performed under continuous ultrasound visualization.   The microwire was advanced and the needle was exchanged for  a microsheath.  The J-wire was then advanced and a 6 Fr sheath inserted.  Hand injections were completed to image the access from the arterial anastomosis through the entire access.  The central venous structures were also imaged by hand injections.  Based on the images,  3000 units of heparin was given and a wire was negotiated through the strictures within the venous portion of the graft as well as the central stenosis. The sheath was then upsized to a 7 [place] sheath.  An 5 mm ultra score balloon was used to predilate both the central lesion as well as the peripheral lesion.  Inflation was to 14 atm for 1 minute.  The detector was then repositioned over the peripheral portion of the AV access and a 8 mm by a 25 mm Viabahn stent was deployed across the cephalic vein at the level of the confluence with the subclavian vein. In 8 mm Dorado balloon balloon was used to treat the stricture and post dilate the Viabahn stent. Inflation was to 18 atm for 1 minute.  Attention was then turned to the central lesion this was treated initially with the 8 mm Dorado balloon subsequently a 12 mm x 40 mm Lutonix drug-eluting balloon was advanced across the proximal subclavian lesion inflation was to 14 atm for 2 full minutes.  Follow-up imaging demonstrates significant improvement with a marked reduction of the strictures there is less than 10% residual stenosis at both locations.  There is now rapid flow of contrast through the graft and the central veins.   A 4-0 Monocryl  purse-string suture was sewn around the sheath.  The sheath was removed and light pressure was applied.  A sterile bandage was applied to the puncture site.    COMPLICATIONS: None  CONDITION: Toni Carlson, M.D [facility] Office: [phone]  10/25/2016 1:45 PM

## 2016-10-25 NOTE — H&P (Signed)
[facility] Admission History & Physical  MRN : [id number]  Toni Carlson is a 71 y.o. ([birth year]) female who presents with chief complaint of No chief complaint on file. Marland Kitchen  History of Present Illness: I am asked to evaluate the patient by the dialysis center. The patient was sent here because they were unable to achieve adequate dialysis this morning. Furthermore the Center states there is very poor thrill and bruit. The patient states there there have been increasing problems with the access, such as "pulling clots" during dialysis and prolonged bleeding after decannulation. The patient estimates these problems have been going on for several weeks. The patient is unaware of any other change.  Patient denies pain or tenderness overlying the access.  There is no pain with dialysis.  The patient denies hand pain or finger pain consistent with steal syndrome.   There have not been any recent interventions or declots of this access.  The patient is not reported to be chronically hypotensive on dialysis.  Current Facility-Administered Medications  Medication Dose Route Frequency Provider Last Rate Last Dose  . 0.9 %  sodium chloride infusion   Intravenous Continuous Stegmayer, Kimberly A, PA-C      . ceFAZolin (ANCEF) IVPB 1 g/50 mL premix  1 g Intravenous Once Stegmayer, Kimberly A, PA-C      . famotidine (PEPCID) tablet 40 mg  40 mg Oral PRN Stegmayer, Janalyn Harder, PA-C      . HYDROmorphone (DILAUDID) injection 1 mg  1 mg Intravenous Once PRN Stegmayer, Kimberly A, PA-C      . methylPREDNISolone sodium succinate (SOLU-MEDROL) 125 mg/2 mL injection 125 mg  125 mg Intravenous PRN Stegmayer, Kimberly A, PA-C      . ondansetron (ZOFRAN) injection 4 mg  4 mg Intravenous Q6H PRN Stegmayer, Janalyn Harder, PA-C        Past Medical History:  Diagnosis Date  . A-fib (HCC)    a. on warfarin  . Anemia   . Carotid artery stenosis    a. ultrasound 03/2015: RICA 28-76%  stenosis, LICA less than 81% stenosis  . Chronic diastolic CHF (congestive heart failure) ([place])    a. echo 03/2015: EF 60-65%, normal wall motion, diastolic parameters were c/w restrictive physiology, indicative of decreased LV diastolic compliance and/or increased LA pressure, mild AS, mild MR, left atrium was severely dilated, RA was severely dilated, PASP was severely increased at 90 mm Hg  . Coronary artery disease, non-occlusive    a. cardiac cath 2013  . ESRD (end stage renal disease) ([place])    On Tue-Thur-Sat dialysis  . H/O ischemic right MCA stroke    a. 03/2015; b. residual left-sided facial droop and slurred speech  . Hyperparathyroidism, secondary renal ([place])   . Hypertension   . Nicotine dependence   . Pulmonary HTN    as above  . Symptomatic bradycardia    a. history of symptomatic bradycardia; b. felt to be 2/2 metabolic abnormalities & required temp wire but no PPM, resolved with HD    Past Surgical History:  Procedure Laterality Date  . ESOPHAGOGASTRODUODENOSCOPY (EGD) WITH PROPOFOL N/A 04/14/2015   Procedure: ESOPHAGOGASTRODUODENOSCOPY (EGD) WITH PROPOFOL;  Surgeon: Lucilla Lame, MD;  Location: [facility] ENDOSCOPY;  Service: Endoscopy;  Laterality: N/A;  . ESOPHAGOGASTRODUODENOSCOPY (EGD) WITH PROPOFOL N/A 07/17/2015   Procedure: ESOPHAGOGASTRODUODENOSCOPY (EGD) WITH PROPOFOL;  Surgeon: Josefine Class, MD;  Location: [facility] ENDOSCOPY;  Service: Endoscopy;  Laterality: N/A;  . EUS N/A 09/24/2015   Procedure: UPPER ENDOSCOPIC  ULTRASOUND (EUS) LINEAR;  Surgeon: Jola Schmidt, MD;  Location: [facility] ENDOSCOPY;  Service: Endoscopy;  Laterality: N/A;  . LOWER EXTREMITY ANGIOGRAPHY Right 06/09/2016   Procedure: Lower Extremity Angiography;  Surgeon: Algernon Huxley, MD;  Location: [place] [facility];  Service: Cardiovascular;  Laterality: Right;  . PERIPHERAL VASCULAR CATHETERIZATION N/A 02/23/2015   Procedure: A/V Shuntogram/Fistulagram;  Surgeon: Algernon Huxley, MD;  Location: [place]  [facility];  Service: Cardiovascular;  Laterality: N/A;  . PERIPHERAL VASCULAR CATHETERIZATION N/A 02/23/2015   Procedure: A/V Shunt Intervention;  Surgeon: Algernon Huxley, MD;  Location: [place] [facility];  Service: Cardiovascular;  Laterality: N/A;    Social History Social History  Substance Use Topics  . Smoking status: Former Smoker    Packs/day: 0.25    Years: 30.00    Types: Cigarettes    Quit date: 11/20/2013  . Smokeless tobacco: Never Used  . Alcohol use No    Family History Family History  Problem Relation Age of Onset  . CAD Father   . Dementia Mother     No family history of bleeding or clotting disorders, autoimmune disease or porphyria  No Known Allergies   REVIEW OF SYSTEMS (Negative unless checked)  Constitutional: [] Weight loss  [] Fever  [] Chills Cardiac: [] Chest pain   [] Chest pressure   [] Palpitations   [] Shortness of breath when laying flat   [] Shortness of breath at rest   [x] Shortness of breath with exertion. Vascular:  [] Pain in legs with walking   [] Pain in legs at rest   [] Pain in legs when laying flat   [] Claudication   [] Pain in feet when walking  [] Pain in feet at rest  [] Pain in feet when laying flat   [] History of DVT   [] Phlebitis   [] Swelling in legs   [] Varicose veins   [] Non-healing ulcers Pulmonary:   [] Uses home oxygen   [] Productive cough   [] Hemoptysis   [] Wheeze  [] COPD   [] Asthma Neurologic:  [] Dizziness  [] Blackouts   [] Seizures   [] History of stroke   [] History of TIA  [] Aphasia   [] Temporary blindness   [] Dysphagia   [] Weakness or numbness in arms   [] Weakness or numbness in legs Musculoskeletal:  [] Arthritis   [] Joint swelling   [] Joint pain   [] Low back pain Hematologic:  [] Easy bruising  [] Easy bleeding   [] Hypercoagulable state   [] Anemic  [] Hepatitis Gastrointestinal:  [] Blood in stool   [] Vomiting blood  [] Gastroesophageal reflux/heartburn   [] Difficulty swallowing. Genitourinary:  [x] Chronic kidney disease   [] Difficult urination   [] Frequent urination  [] Burning with urination   [] Blood in urine Skin:  [] Rashes   [] Ulcers   [] Wounds Psychological:  [] History of anxiety   []  History of major depression.  Physical Examination  Vitals:   10/25/16 1117  BP: (!) 155/64  Pulse: (!) 57  Resp: 14  Temp: 98.4 F (36.9 C)  TempSrc: Oral  SpO2: 100%  Weight: 68 kg (150 lb)  Height: 5\' 6"  (1.676 m)   Body mass index is 24.21 kg/m. Gen: WD/WN, NAD Head: [place]/AT, No temporalis wasting. Prominent temp pulse not noted. Ear/Nose/Throat: Hearing grossly intact, nares w/o erythema or drainage, oropharynx w/o Erythema/Exudate,  Eyes: Conjunctiva clear, sclera non-icteric Neck: Trachea midline.  No JVD.  Pulmonary:  Good air movement, respirations not labored, no use of accessory muscles.  Cardiac: RRR, normal S1, S2. Vascular:  Left arm AV graft pulsatile with poor thrill and poor bruit Vessel Right Left  Radial Palpable Palpable  Ulnar  Not Palpable Not Palpable  Brachial Palpable Palpable  Carotid Palpable, without bruit Palpable, without bruit  Gastrointestinal: soft, non-tender/non-distended. No guarding/reflex.  Musculoskeletal: M/S 5/5 throughout.  Extremities without ischemic changes.  No deformity or atrophy.  Neurologic: Sensation grossly intact in extremities.  Symmetrical.  Speech is fluent. Motor exam as listed above. Psychiatric: Judgment intact, Mood & affect appropriate for pt's clinical situation. Dermatologic: No rashes or ulcers noted.  No cellulitis or open wounds. Lymph : No Cervical, Axillary, or Inguinal lymphadenopathy.   CBC Lab Results  Component Value Date   WBC 6.8 06/10/2016   HGB 7.7 (L) 06/10/2016   HCT 23.2 (L) 06/10/2016   MCV 95.5 06/10/2016   PLT 149 (L) 06/10/2016    BMET    Component Value Date/Time   NA 136 06/10/2016 0518   NA 137 11/26/2013 0650   K 4.3 06/10/2016 0518   K 5.2 (H) 11/26/2013 0650   CL 101 06/10/2016 0518   CL 102 11/26/2013 0650   CO2 25 06/10/2016  0518   CO2 26 11/26/2013 0650   GLUCOSE 89 06/10/2016 0518   GLUCOSE 87 11/26/2013 0650   BUN 40 (H) 06/10/2016 0518   BUN 52 (H) 11/26/2013 0650   CREATININE 7.26 (H) 06/10/2016 0518   CREATININE 9.20 (H) 11/26/2013 0650   CALCIUM 7.8 (L) 06/10/2016 0518   CALCIUM 8.8 11/26/2013 0650   GFRNONAA 5 (L) 06/10/2016 0518   GFRNONAA 4 (L) 11/26/2013 0650   GFRAA 6 (L) 06/10/2016 0518   GFRAA 5 (L) 11/26/2013 0650   CrCl cannot be calculated (Patient's most recent lab result is older than the maximum 21 days allowed.).  COAG Lab Results  Component Value Date   INR 0.98 05/12/2016   INR 1.14 07/15/2015   INR 1.10 07/08/2015    Radiology No results found.  Assessment/Plan 1.  Complication dialysis device with thrombosis AV access:  Patient's left arm dialysis access is malfunctioning. The patient will undergo angiography and correction of any problems using interventional techniques with the hope of restoring function to the access.  The risks and benefits were described to the patient.  All questions were answered.  The patient agrees to proceed with angiography and intervention. Potassium will be drawn to ensure that it is an appropriate level prior to performing intervention. 2.  End-stage renal disease requiring hemodialysis:  Patient will continue dialysis therapy without further interruption if a successful intervention is not achieved then a tunneled catheter will be placed. Dialysis has already been arranged. 3.  Hypertension:  Patient will continue medical management; nephrology is following no changes in oral medications. 4.   Coronary artery disease:  EKG will be monitored. Nitrates will be used if needed. The patient's oral cardiac medications will be continued.    Hortencia Pilar, MD  10/25/2016 11:25 AM

## 2016-11-16 ENCOUNTER — Observation Stay
Admission: EM | Admit: 2016-11-16 | Discharge: 2016-11-17 | Disposition: A | Discharge status: Home / Self Care | Payer: Medicare Other | Attending: Internal Medicine | Admitting: Internal Medicine

## 2016-11-16 ENCOUNTER — Encounter: Payer: Self-pay | Admitting: Emergency Medicine

## 2016-11-16 DIAGNOSIS — K219 Gastro-esophageal reflux disease without esophagitis: Secondary | ICD-10-CM | POA: Diagnosis not present

## 2016-11-16 DIAGNOSIS — N2581 Secondary hyperparathyroidism of renal origin: Secondary | ICD-10-CM | POA: Insufficient documentation

## 2016-11-16 DIAGNOSIS — Z79899 Other long term (current) drug therapy: Secondary | ICD-10-CM | POA: Insufficient documentation

## 2016-11-16 DIAGNOSIS — K922 Gastrointestinal hemorrhage, unspecified: Secondary | ICD-10-CM | POA: Insufficient documentation

## 2016-11-16 DIAGNOSIS — Z992 Dependence on renal dialysis: Secondary | ICD-10-CM | POA: Diagnosis not present

## 2016-11-16 DIAGNOSIS — N186 End stage renal disease: Secondary | ICD-10-CM | POA: Diagnosis not present

## 2016-11-16 DIAGNOSIS — I272 Pulmonary hypertension, unspecified: Secondary | ICD-10-CM | POA: Insufficient documentation

## 2016-11-16 DIAGNOSIS — D631 Anemia in chronic kidney disease: Secondary | ICD-10-CM | POA: Insufficient documentation

## 2016-11-16 DIAGNOSIS — I5032 Chronic diastolic (congestive) heart failure: Secondary | ICD-10-CM | POA: Insufficient documentation

## 2016-11-16 DIAGNOSIS — Z7982 Long term (current) use of aspirin: Secondary | ICD-10-CM | POA: Insufficient documentation

## 2016-11-16 DIAGNOSIS — I132 Hypertensive heart and chronic kidney disease with heart failure and with stage 5 chronic kidney disease, or end stage renal disease: Principal | ICD-10-CM | POA: Insufficient documentation

## 2016-11-16 DIAGNOSIS — E876 Hypokalemia: Secondary | ICD-10-CM | POA: Insufficient documentation

## 2016-11-16 DIAGNOSIS — I2510 Atherosclerotic heart disease of native coronary artery without angina pectoris: Secondary | ICD-10-CM | POA: Insufficient documentation

## 2016-11-16 DIAGNOSIS — Z8719 Personal history of other diseases of the digestive system: Secondary | ICD-10-CM | POA: Diagnosis not present

## 2016-11-16 DIAGNOSIS — D649 Anemia, unspecified: Secondary | ICD-10-CM | POA: Diagnosis present

## 2016-11-16 DIAGNOSIS — I4891 Unspecified atrial fibrillation: Secondary | ICD-10-CM | POA: Insufficient documentation

## 2016-11-16 DIAGNOSIS — I6529 Occlusion and stenosis of unspecified carotid artery: Secondary | ICD-10-CM | POA: Insufficient documentation

## 2016-11-16 LAB — CBC WITH DIFFERENTIAL/PLATELET
BASOS ABS: 0.1 10*3/uL (ref 0–0.1)
BASOS PCT: 1 %
EOS ABS: 0.1 10*3/uL (ref 0–0.7)
Eosinophils Relative: 2 %
HCT: 18 % — ABNORMAL LOW (ref 35.0–47.0)
HEMOGLOBIN: 5.8 g/dL — AB (ref 12.0–16.0)
Lymphocytes Relative: 15 %
Lymphs Abs: 0.7 10*3/uL — ABNORMAL LOW (ref 1.0–3.6)
MCH: 30.8 pg (ref 26.0–34.0)
MCHC: 32.4 g/dL (ref 32.0–36.0)
MCV: 95 fL (ref 80.0–100.0)
MONOS PCT: 7 %
Monocytes Absolute: 0.3 10*3/uL (ref 0.2–0.9)
NEUTROS ABS: 3.7 10*3/uL (ref 1.4–6.5)
NEUTROS PCT: 75 %
Platelets: 220 10*3/uL (ref 150–440)
RBC: 1.89 MIL/uL — AB (ref 3.80–5.20)
RDW: 20.1 % — ABNORMAL HIGH (ref 11.5–14.5)
WBC: 4.9 10*3/uL (ref 3.6–11.0)

## 2016-11-16 LAB — COMPREHENSIVE METABOLIC PANEL
ALT: 10 U/L — ABNORMAL LOW (ref 14–54)
ANION GAP: 9 (ref 5–15)
AST: 15 U/L (ref 15–41)
Albumin: 3.6 g/dL (ref 3.5–5.0)
Alkaline Phosphatase: 686 U/L — ABNORMAL HIGH (ref 38–126)
BILIRUBIN TOTAL: 0.4 mg/dL (ref 0.3–1.2)
BUN: 10 mg/dL (ref 6–20)
CHLORIDE: 97 mmol/L — AB (ref 101–111)
CO2: 33 mmol/L — ABNORMAL HIGH (ref 22–32)
Calcium: 9.2 mg/dL (ref 8.9–10.3)
Creatinine, Ser: 2.88 mg/dL — ABNORMAL HIGH (ref 0.44–1.00)
GFR calc Af Amer: 18 mL/min — ABNORMAL LOW (ref >60)
GFR, EST NON AFRICAN AMERICAN: 16 mL/min — AB (ref >60)
Glucose, Bld: 100 mg/dL — ABNORMAL HIGH (ref 65–99)
POTASSIUM: 2.8 mmol/L — AB (ref 3.5–5.1)
Sodium: 139 mmol/L (ref 135–145)
TOTAL PROTEIN: 7.2 g/dL (ref 6.5–8.1)

## 2016-11-16 LAB — PROTIME-INR
INR: 1.11
PROTHROMBIN TIME: 14.4 s (ref 11.4–15.2)

## 2016-11-16 LAB — LIPASE, BLOOD: LIPASE: 22 U/L (ref 11–51)

## 2016-11-16 LAB — MRSA PCR SCREENING: MRSA BY PCR: NEGATIVE

## 2016-11-16 LAB — APTT: aPTT: 36 seconds (ref 24–36)

## 2016-11-16 LAB — TROPONIN I: TROPONIN I: 0.04 ng/mL — AB (ref <0.03)

## 2016-11-16 LAB — PREPARE RBC (CROSSMATCH)

## 2016-11-16 MED ORDER — ONDANSETRON HCL 4 MG PO TABS: 4.0000 mg | ORAL_TABLET | Freq: Four times a day (QID) | ORAL | Status: DC | PRN

## 2016-11-16 MED ORDER — AMLODIPINE BESYLATE 10 MG PO TABS
10.0000 mg | ORAL_TABLET | Freq: Every day | ORAL | Status: DC
  Administered 2016-11-16 – 2016-11-17 (×2): 10 mg via ORAL
  Filled 2016-11-16 (×2): qty 1

## 2016-11-16 MED ORDER — SODIUM CHLORIDE 0.9 % IV SOLN: Freq: Once | INTRAVENOUS | Status: DC

## 2016-11-16 MED ORDER — LOPERAMIDE HCL 2 MG PO CAPS: 2.0000 mg | ORAL_CAPSULE | ORAL | Status: DC | PRN

## 2016-11-16 MED ORDER — CARVEDILOL 6.25 MG PO TABS
3.1250 mg | ORAL_TABLET | Freq: Every day | ORAL | Status: DC
  Administered 2016-11-16: 3.125 mg via ORAL
  Filled 2016-11-16: qty 1

## 2016-11-16 MED ORDER — CALCIUM ACETATE (PHOS BINDER) 667 MG PO CAPS
1334.0000 mg | ORAL_CAPSULE | Freq: Three times a day (TID) | ORAL | Status: DC
  Administered 2016-11-17: 1334 mg via ORAL
  Filled 2016-11-16: qty 2

## 2016-11-16 MED ORDER — GABAPENTIN 300 MG PO CAPS
300.0000 mg | ORAL_CAPSULE | Freq: Every day | ORAL | Status: DC
  Administered 2016-11-16: 300 mg via ORAL
  Filled 2016-11-16: qty 1

## 2016-11-16 MED ORDER — LORATADINE 10 MG PO TABS
10.0000 mg | ORAL_TABLET | Freq: Every day | ORAL | Status: DC
  Administered 2016-11-17: 10 mg via ORAL
  Filled 2016-11-16: qty 1

## 2016-11-16 MED ORDER — PANTOPRAZOLE SODIUM 40 MG PO TBEC
40.0000 mg | DELAYED_RELEASE_TABLET | Freq: Every day | ORAL | Status: DC
  Administered 2016-11-17: 40 mg via ORAL
  Filled 2016-11-16: qty 1

## 2016-11-16 MED ORDER — TRAZODONE HCL 50 MG PO TABS
25.0000 mg | ORAL_TABLET | Freq: Every day | ORAL | Status: DC
  Administered 2016-11-16: 25 mg via ORAL
  Filled 2016-11-16: qty 1

## 2016-11-16 MED ORDER — ACETAMINOPHEN 650 MG RE SUPP: 650.0000 mg | Freq: Four times a day (QID) | RECTAL | Status: DC | PRN

## 2016-11-16 MED ORDER — ONDANSETRON HCL 4 MG/2ML IJ SOLN: 4.0000 mg | Freq: Four times a day (QID) | INTRAMUSCULAR | Status: DC | PRN

## 2016-11-16 MED ORDER — PANTOPRAZOLE SODIUM 40 MG PO TBEC: 40.0000 mg | DELAYED_RELEASE_TABLET | Freq: Every day | ORAL | Status: DC

## 2016-11-16 MED ORDER — ATORVASTATIN CALCIUM 20 MG PO TABS
40.0000 mg | ORAL_TABLET | Freq: Every day | ORAL | Status: DC
  Administered 2016-11-16 – 2016-11-17 (×2): 40 mg via ORAL
  Filled 2016-11-16 (×2): qty 2

## 2016-11-16 MED ORDER — LOSARTAN POTASSIUM 50 MG PO TABS
100.0000 mg | ORAL_TABLET | Freq: Every day | ORAL | Status: DC
  Administered 2016-11-17: 100 mg via ORAL
  Filled 2016-11-16 (×2): qty 2

## 2016-11-16 MED ORDER — ACETAMINOPHEN 325 MG PO TABS: 650.0000 mg | ORAL_TABLET | Freq: Four times a day (QID) | ORAL | Status: DC | PRN

## 2016-11-16 MED ORDER — SODIUM CHLORIDE 0.9 % IV SOLN: 10.0000 mL/h | Freq: Once | INTRAVENOUS | Status: DC

## 2016-11-16 NOTE — ED Notes (Signed)
Per admission MD, Patient is to received typed blood on unit instead of in the emergency department.  Blood bank informed of this information.

## 2016-11-16 NOTE — ED Provider Notes (Signed)
[facility] Emergency Department Provider Note   ____________________________________________   First MD Initiated Contact with Patient 11/16/16 1529     (approximate)  I have reviewed the triage vital signs and the nursing notes.   HISTORY  Chief Complaint Abnormal Lab    HPI Toni Carlson is a 71 y.o. female referred for low hemoglobin  Patient sent from dialysis today for concerns of decreasing hemoglobin count. She does receive Neupogen. Reports she has had problems with low blood counts in the past  Denies any nausea or vomiting. No black or bloody stools. Denies taking any blood thinners. No chest pain or trouble breathing. Slightly more fatigued the last couple weeks, but otherwise denies any symptoms or concerns. Reports she has not have any concerns or symptoms during dialysis but instructed her to come here for further evaluation.  Denies taking any blood thinner.   Past Medical History:  Diagnosis Date  . A-fib (HCC)    a. on warfarin  . Anemia   . Carotid artery stenosis    a. ultrasound 03/2015: RICA 16-10% stenosis, LICA less than 96% stenosis  . Chronic diastolic CHF (congestive heart failure) ([place])    a. echo 03/2015: EF 60-65%, normal wall motion, diastolic parameters were c/w restrictive physiology, indicative of decreased LV diastolic compliance and/or increased LA pressure, mild AS, mild MR, left atrium was severely dilated, RA was severely dilated, PASP was severely increased at 90 mm Hg  . Coronary artery disease, non-occlusive    a. cardiac cath 2013  . ESRD (end stage renal disease) ([place])    On Tue-Thur-Sat dialysis  . H/O ischemic right MCA stroke    a. 03/2015; b. residual left-sided facial droop and slurred speech  . Hyperparathyroidism, secondary renal ([place])   . Hypertension   . Nicotine dependence   . Pulmonary HTN ([place])    as above  . Symptomatic bradycardia    a. history of symptomatic bradycardia; b. felt to  be 2/2 metabolic abnormalities & required temp wire but no PPM, resolved with HD    Patient Active Problem List   Diagnosis Date Noted  . Atherosclerosis of artery of extremity with rest pain ([place]) 06/09/2016  . Atherosclerosis of native arteries of extremity with rest pain ([place]) 05/24/2016  . Gastric polyp 08/18/2015  . Anemia 07/15/2015  . GI bleed 07/15/2015  . Polyneuropathy 07/08/2015  . Degenerative disc disease, lumbar 07/08/2015  . Pancytopenia ([place]) 07/08/2015  . Generalized weakness 07/08/2015  . End stage renal disease ([place]) 07/08/2015  . Atherosclerotic peripheral vascular disease ([place]) 07/08/2015  . Pain in both feet 07/02/2015  . Gastric ulceration   . Acute GI bleeding   . GIB (gastrointestinal bleeding) 04/12/2015  . Left-sided weakness 04/01/2015  . Essential hypertension, malignant 04/01/2015  . Anemia of chronic disease 04/01/2015  . Elevated troponin 04/01/2015  . Pulmonary hypertension ([place]) 04/01/2015  . Atrial fibrillation ([place])   . CVA (cerebral infarction) 03/26/2015    Past Surgical History:  Procedure Laterality Date  . A/V SHUNTOGRAM Left 10/25/2016   Procedure: A/V Fistulagram;  Surgeon: Katha Cabal, MD;  Location: [place] [facility];  Service: Cardiovascular;  Laterality: Left;  . ESOPHAGOGASTRODUODENOSCOPY (EGD) WITH PROPOFOL N/A 04/14/2015   Procedure: ESOPHAGOGASTRODUODENOSCOPY (EGD) WITH PROPOFOL;  Surgeon: Lucilla Lame, MD;  Location: [facility] ENDOSCOPY;  Service: Endoscopy;  Laterality: N/A;  . ESOPHAGOGASTRODUODENOSCOPY (EGD) WITH PROPOFOL N/A 07/17/2015   Procedure: ESOPHAGOGASTRODUODENOSCOPY (EGD) WITH PROPOFOL;  Surgeon: Josefine Class, MD;  Location: [facility]  ENDOSCOPY;  Service: Endoscopy;  Laterality: N/A;  . EUS N/A 09/24/2015   Procedure: UPPER ENDOSCOPIC ULTRASOUND (EUS) LINEAR;  Surgeon: Jola Schmidt, MD;  Location: [facility] ENDOSCOPY;  Service: Endoscopy;  Laterality: N/A;  . LOWER EXTREMITY ANGIOGRAPHY Right 06/09/2016   Procedure:  Lower Extremity Angiography;  Surgeon: Algernon Huxley, MD;  Location: [place] [facility];  Service: Cardiovascular;  Laterality: Right;  . PERIPHERAL VASCULAR CATHETERIZATION N/A 02/23/2015   Procedure: A/V Shuntogram/Fistulagram;  Surgeon: Algernon Huxley, MD;  Location: [place] [facility];  Service: Cardiovascular;  Laterality: N/A;  . PERIPHERAL VASCULAR CATHETERIZATION N/A 02/23/2015   Procedure: A/V Shunt Intervention;  Surgeon: Algernon Huxley, MD;  Location: [place] [facility];  Service: Cardiovascular;  Laterality: N/A;    Prior to Admission medications   Medication Sig Start Date End Date Taking? Authorizing Provider  alendronate (FOSAMAX) 10 MG tablet Take 10 mg by mouth daily before breakfast. Take with a full glass of water on an empty stomach.    [provider]  amLODipine (NORVASC) 10 MG tablet Take 1 tablet (10 mg total) by mouth daily. 09/19/15   Carrie Mew, MD  aspirin EC 81 MG EC tablet Take 1 tablet (81 mg total) by mouth daily. 06/10/16   Algernon Huxley, MD  atorvastatin (LIPITOR) 40 MG tablet Take 1 tablet (40 mg total) by mouth daily. 09/19/15   Carrie Mew, MD  calcium acetate, Phos Binder, (PHOSLYRA) 667 MG/5ML SOLN Take 10 mLs (1,334 mg total) by mouth 3 (three) times daily with meals. 09/19/15   Carrie Mew, MD  carvedilol (COREG) 6.25 MG tablet Take 1 tablet (6.25 mg total) by mouth 2 (two) times daily with a meal. 09/19/15   Carrie Mew, MD  cinacalcet (SENSIPAR) 30 MG tablet Take 2 tablets (60 mg total) by mouth daily. 09/19/15   Carrie Mew, MD  clopidogrel (PLAVIX) 75 MG tablet Take 1 tablet (75 mg total) by mouth daily. Patient not taking: Reported on 06/28/2016 06/10/16   Algernon Huxley, MD  hydrALAZINE (APRESOLINE) 25 MG tablet Take 1 tablet (25 mg total) by mouth 3 (three) times daily. 09/19/15   Carrie Mew, MD  losartan (COZAAR) 100 MG tablet Take 1 tablet (100 mg total) by mouth daily. 09/19/15   Carrie Mew, MD  pantoprazole  (PROTONIX) 40 MG tablet Take 1 tablet (40 mg total) by mouth 2 (two) times daily. 09/19/15   Carrie Mew, MD  polyethylene glycol [facility] / Floria Raveling) packet Take 17 g by mouth daily as needed for mild constipation. Patient not taking: Reported on 10/25/2016 09/19/15   Carrie Mew, MD  pregabalin (LYRICA) 25 MG capsule Take 1 capsule (25 mg total) by mouth 2 (two) times daily. 09/19/15   Carrie Mew, MD  sevelamer carbonate (RENVELA) 800 MG tablet Take 800 mg by mouth 3 (three) times daily with meals.    [provider]  sucralfate (CARAFATE) 1 GM/10ML suspension Take 1 g by mouth 4 (four) times daily -  with meals and at bedtime.    [provider]  traMADol (ULTRAM) 50 MG tablet Take 1 tablet (50 mg total) by mouth every 6 (six) hours as needed. Patient not taking: Reported on 10/25/2016 06/10/16   Algernon Huxley, MD  traZODone (DESYREL) 50 MG tablet  05/18/16   [provider]  trolamine salicylate (ASPERCREME) 10 % cream Apply topically 2 (two) times daily as needed for muscle pain. Patient not taking: Reported on 10/25/2016 09/19/15   Carrie Mew, MD  VOLTAREN 1 % GEL  02/23/16   [provider]  Aspirin and Plavix noted on her last med rec patient confirms still taking aspirin and Plavix  Allergies Patient has no known allergies.  Family History  Problem Relation Age of Onset  . CAD Father   . Dementia Mother     Social History Social History  Substance Use Topics  . Smoking status: Former Smoker    Packs/day: 0.25    Years: 30.00    Types: Cigarettes    Quit date: 11/20/2013  . Smokeless tobacco: Never Used  . Alcohol use No    Review of Systems Constitutional: No fever/chills Eyes: No visual changes. ENT: No sore throat. Cardiovascular: Denies chest pain. Respiratory: Denies shortness of breath. Gastrointestinal: No abdominal pain.  No nausea, no vomiting.  No diarrhea.  No constipation. Genitourinary: Negative for  dysuria. Musculoskeletal: Negative for back pain. Skin: Negative for rash. Neurological: Negative for headaches, focal weakness or numbness.    ____________________________________________   PHYSICAL EXAM:  VITAL SIGNS: ED Triage Vitals  Enc Vitals Group     BP 11/16/16 1525 (!) 137/52     Pulse Rate 11/16/16 1525 (!) 55     Resp 11/16/16 1525 18     Temp 11/16/16 1525 97.8 F (36.6 C)     Temp Source 11/16/16 1525 Oral     SpO2 11/16/16 1525 98 %     Weight 11/16/16 1526 130 lb (59 kg)     Height 11/16/16 1526 5\' 7"  (1.702 m)     Head Circumference --      Peak Flow --      Pain Score --      Pain Loc --      Pain Edu? --      Excl. in [place]? --     Constitutional: Alert and oriented. Well appearing and in no acute distress. Eyes: Conjunctivae are normal. Head: Atraumatic. Nose: No congestion/rhinnorhea. Mouth/Throat: Mucous membranes are moist. Neck: No stridor.   Cardiovascular: Normal rate, regular rhythm. Grossly normal heart sounds.  Good peripheral circulation. Respiratory: Normal respiratory effort.  No retractions. Lungs CTAB. Gastrointestinal: Soft and nontender. No distention.Rectal exam performed with nurse Jinny Blossom. The patient does have dark, heme positive stool without gross blood noted. Musculoskeletal: No lower extremity tenderness nor edema. Neurologic:  Normal speech and language. No gross focal neurologic deficits are appreciated.  Skin:  Skin is warm, dry and intact. No rash noted. Psychiatric: Mood and affect are normal. Speech and behavior are normal.  ____________________________________________   LABS (all labs ordered are listed, but only abnormal results are displayed)  Labs Reviewed  COMPREHENSIVE METABOLIC PANEL - Abnormal; Notable for the following:       Result Value   Potassium 2.8 (*)    Chloride 97 (*)    CO2 33 (*)    Glucose, Bld 100 (*)    Creatinine, Ser 2.88 (*)    ALT 10 (*)    Alkaline Phosphatase 686 (*)    GFR calc non  Af Amer 16 (*)    GFR calc Af Amer 18 (*)    All other components within normal limits  TROPONIN I - Abnormal; Notable for the following:    Troponin I 0.04 (*)    All other components within normal limits  CBC WITH DIFFERENTIAL/PLATELET - Abnormal; Notable for the following:    RBC 1.89 (*)    Hemoglobin 5.8 (*)    HCT 18.0 (*)    RDW 20.1 (*)  Lymphs Abs 0.7 (*)    All other components within normal limits  LIPASE, BLOOD  PROTIME-INR  APTT  TYPE AND SCREEN  PREPARE RBC (CROSSMATCH)   ____________________________________________  EKG  Reviewed and interpreted by me at 1520 Heart rate 50 Care is ongoing She dysuria Sinus rhythm, occasional PAC, LVH with associated repolarization abnormality, compared with previous T-wave inversions are now much more notable in inferolateral distribution, but may be secondary to LVH. Send trop to rule out ischemia. Patient denying any pain ____________________________________________  RADIOLOGY   ____________________________________________   PROCEDURES  Procedure(s) performed: None  Procedures  Critical Care performed: Yes, see critical care note(s)  CRITICAL CARE Performed by: Delman Kitten   Total critical care time: 38 minutes  Critical care time was exclusive of separately billable procedures and treating other patients.  Critical care was necessary to treat or prevent imminent or life-threatening deterioration.  Critical care was time spent personally by me on the following activities: development of treatment plan with patient and/or surrogate as well as nursing, discussions with consultants, evaluation of patient's response to treatment, examination of patient, obtaining history from patient or surrogate, ordering and performing treatments and interventions, ordering and review of laboratory studies, ordering and review of radiographic studies, pulse oximetry and re-evaluation of patient's condition.  Patient with  anemia, evidence of blood in stool, decreasing blood counts. Discussed with Dr. Abigail Butts and we will transfuse patient for 1 unit here. Admit for careful monitoring and ongoing transfusion care with GI consultation which will be available tomorrow. The patient does not appear to need an emergent endoscopy at this time, and will be monitored closely under the hospitalist service. Nephrology team aware.  Risks and benefits discussed in person with the patient for blood transfusion. She can sense and signs. No signs of volume overload at this time. We'll monitor closely. ____________________________________________   INITIAL IMPRESSION / ASSESSMENT AND PLAN / ED COURSE  Pertinent labs & imaging results that were available during my care of the patient were reviewed by me and considered in my medical decision making (see chart for details).  Patient presents for evaluation for low hemoglobin at dialysis.   Appears largely asymptomatic, does report slight increase in fatigue last week or 2. Hemodynamically stable in no distress. EKG does demonstrate some new T-wave changes, but may be related to LVH.      ____________________________________________   FINAL CLINICAL IMPRESSION(S) / ED DIAGNOSES  Final diagnoses:  Gastrointestinal hemorrhage, unspecified gastrointestinal hemorrhage type  Anemia, unspecified type      NEW MEDICATIONS STARTED DURING THIS VISIT:  New Prescriptions   No medications on file     Note:  This document was prepared using Dragon voice recognition software and may include unintentional dictation errors.     Delman Kitten, MD 11/16/16 [phone]

## 2016-11-16 NOTE — ED Notes (Signed)
MD to bedside, per MD hemoccult positive.

## 2016-11-16 NOTE — ED Triage Notes (Signed)
Pt presents to ED via [facility] from [facility]. Per EMS pt was transported after dialysis for hgb of 6.1. Pt denies any complaints at this time, denies any SHOB, CP, states some weakness, but not unusual for her after dialysis. Pt is alert and oriented at this time, per EMS VSS and WNL. Upon further inspection of paperwork, hgb of 6.1 was from blood work draw on June 25. MD at bedside during initial triage by this RN.

## 2016-11-16 NOTE — ED Notes (Signed)
Meal tray given to patient along with refreshments.

## 2016-11-16 NOTE — H&P (Signed)
[place] [facility] [place] NAME: Toni Carlson    MR#:  [id number]  DATE OF BIRTH:  [birth year]  DATE OF ADMISSION:  11/16/2016  PRIMARY CARE PHYSICIAN: Murlean Iba, MD   REQUESTING/REFERRING PHYSICIAN: Dr. Jacqualine Code  CHIEF COMPLAINT:  I was told I have low blood count by my kidney doctor  HISTORY OF PRESENT ILLNESS:  Toni Carlson  is a 71 y.o. female with a known history of Chronic atrial fibrillation who is now off anticoagulation, chronic diastolic heart failure, anemia of chronic disease, end-stage renal disease on hemodialysis Tuesday Thursday Saturday comes to the emergency room after she was told by her nephrologist that her hemoglobin is low. She was found to have hemoglobin of 5.8. Patient is asymptomatic. Her hemoglobin last week was 6.5. She denies any dark black or bloody stools. Denies any hematemesis or any bloating or belching. She reports having had EGD in 2017 which showed gastric polypoid lesion that was biopsied which showed evidence of chronic gastritis. and colonoscopy many years ago resulting of which she is not aware of.  Patient currently does not have any symptoms. She is hungry and wants food. Risk and benefits of transfusion discussed with patient and she is agreeable to get blood transfusion. She reports she has had blood transfusion many times before and has done well. Patient is being admitted with acute on chronic anemia.  PAST MEDICAL HISTORY:   Past Medical History:  Diagnosis Date  . A-fib (HCC)    a. on warfarin  . Anemia   . Carotid artery stenosis    a. ultrasound 03/2015: RICA 15-17% stenosis, LICA less than 61% stenosis  . Chronic diastolic CHF (congestive heart failure) ([place])    a. echo 03/2015: EF 60-65%, normal wall motion, diastolic parameters were c/w restrictive physiology, indicative of decreased LV diastolic compliance and/or increased LA pressure, mild AS, mild MR, left atrium was severely dilated, RA  was severely dilated, PASP was severely increased at 90 mm Hg  . Coronary artery disease, non-occlusive    a. cardiac cath 2013  . ESRD (end stage renal disease) ([place])    On Tue-Thur-Sat dialysis  . H/O ischemic right MCA stroke    a. 03/2015; b. residual left-sided facial droop and slurred speech  . Hyperparathyroidism, secondary renal ([place])   . Hypertension   . Nicotine dependence   . Pulmonary HTN ([place])    as above  . Symptomatic bradycardia    a. history of symptomatic bradycardia; b. felt to be 2/2 metabolic abnormalities & required temp wire but no PPM, resolved with HD    PAST SURGICAL HISTOIRY:   Past Surgical History:  Procedure Laterality Date  . A/V SHUNTOGRAM Left 10/25/2016   Procedure: A/V Fistulagram;  Surgeon: Katha Cabal, MD;  Location: [place] [facility];  Service: Cardiovascular;  Laterality: Left;  . ESOPHAGOGASTRODUODENOSCOPY (EGD) WITH PROPOFOL N/A 04/14/2015   Procedure: ESOPHAGOGASTRODUODENOSCOPY (EGD) WITH PROPOFOL;  Surgeon: Lucilla Lame, MD;  Location: [facility] ENDOSCOPY;  Service: Endoscopy;  Laterality: N/A;  . ESOPHAGOGASTRODUODENOSCOPY (EGD) WITH PROPOFOL N/A 07/17/2015   Procedure: ESOPHAGOGASTRODUODENOSCOPY (EGD) WITH PROPOFOL;  Surgeon: Josefine Class, MD;  [facility] END[facility]COPY;  Service: Endoscopy;  Laterality: N/A;  . EUS N/A 09/24/2015   Procedure: UPPER ENDOSCOPIC ULTRASOUND (EUS) LINEAR;  Surgeon: Jola Schmidt, MD;  Location: [facility] ENDOSCOPY;  Service: Endoscopy;  Laterality: N/A;  . LOWER EXTREMITY ANGIOGRAPHY Right 06/09/2016   Procedure: Lower Extremity Angiography;  Surgeon: Algernon Huxley, MD;  Location: [place] [facility];  Service: Cardiovascular;  Laterality: Right;  . PERIPHERAL VASCULAR CATHETERIZATION N/A 02/23/2015   Procedure: A/V Shuntogram/Fistulagram;  Surgeon: Algernon Huxley, MD;  Location: [place] [facility];  Service: Cardiovascular;  Laterality: N/A;  . PERIPHERAL VASCULAR CATHETERIZATION N/A 02/23/2015   Procedure: A/V  Shunt Intervention;  Surgeon: Algernon Huxley, MD;  Location: [place] [facility];  Service: Cardiovascular;  Laterality: N/A;    SOCIAL HISTORY:   Social History  Substance Use Topics  . Smoking status: Former Smoker    Packs/day: 0.25    Years: 30.00    Types: Cigarettes    Quit date: 11/20/2013  . Smokeless tobacco: Never Used  . Alcohol use No    FAMILY HISTORY:   Family History  Problem Relation Age of Onset  . CAD Father   . Dementia Mother     DRUG ALLERGIES:  No Known Allergies  REVIEW OF SYSTEMS:  Review of Systems  Constitutional: Negative for chills, fever and weight loss.  HENT: Negative for ear discharge, ear pain and nosebleeds.   Eyes: Negative for blurred vision, pain and discharge.  Respiratory: Negative for sputum production, shortness of breath, wheezing and stridor.   Cardiovascular: Negative for chest pain, palpitations, orthopnea and PND.  Gastrointestinal: Negative for abdominal pain, diarrhea, nausea and vomiting.  Genitourinary: Negative for frequency and urgency.  Musculoskeletal: Negative for back pain and joint pain.  Neurological: Positive for weakness. Negative for sensory change, speech change and focal weakness.  Psychiatric/Behavioral: Negative for depression and hallucinations. The patient is not nervous/anxious.      MEDICATIONS AT HOME:   Prior to Admission medications   Medication Sig Start Date End Date Taking? Authorizing Provider  amLODipine (NORVASC) 10 MG tablet Take 1 tablet (10 mg total) by mouth daily. 09/19/15  Yes Carrie Mew, MD  aspirin EC 81 MG EC tablet Take 1 tablet (81 mg total) by mouth daily. 06/10/16  Yes Dew, Erskine Squibb, MD  atorvastatin (LIPITOR) 40 MG tablet Take 1 tablet (40 mg total) by mouth daily. 09/19/15  Yes Carrie Mew, MD  calcium acetate (PHOSLO) 667 MG capsule Take 1,334 mg by mouth 3 (three) times daily with meals.   Yes [provider]  carvedilol (COREG) 6.25 MG tablet Take 1 tablet (6.25  mg total) by mouth 2 (two) times daily with a meal. Patient taking differently: Take 3.125 mg by mouth at bedtime.  09/19/15  Yes Carrie Mew, MD  cetirizine (ZYRTEC) 10 MG tablet Take 10 mg by mouth daily.   Yes [provider]  clopidogrel (PLAVIX) 75 MG tablet Take 1 tablet (75 mg total) by mouth daily. 06/10/16  Yes Dew, Erskine Squibb, MD  gabapentin (NEURONTIN) 300 MG capsule Take 300 mg by mouth at bedtime.   Yes [provider]  loperamide (IMODIUM) 2 MG capsule Take 2 mg by mouth as needed for diarrhea or loose stools.   Yes [provider]  losartan (COZAAR) 100 MG tablet Take 1 tablet (100 mg total) by mouth daily. 09/19/15  Yes Carrie Mew, MD  ondansetron (ZOFRAN) 4 MG tablet Take 4 mg by mouth every 8 (eight) hours as needed for nausea or vomiting.   Yes [provider]  pantoprazole (PROTONIX) 40 MG tablet Take 1 tablet (40 mg total) by mouth 2 (two) times daily. Patient taking differently: Take 40 mg by mouth daily.  09/19/15  Yes Carrie Mew, MD  polyethylene glycol [facility] Floria Raveling) packet Take 17 g by mouth  daily as needed for mild constipation. 09/19/15  Yes Carrie Mew, MD  traZODone (DESYREL) 50 MG tablet Take 25 mg by mouth at bedtime.  05/18/16  Yes [provider]  trolamine salicylate (ASPERCREME) 10 % cream Apply topically 2 (two) times daily as needed for muscle pain. 09/19/15  Yes Carrie Mew, MD      VITAL SIGNS:  Blood pressure (!) 126/48, pulse (!) 56, temperature 97.8 F (36.6 C), temperature source Oral, resp. rate 15, height 5\' 7"  (1.702 m), weight 59 kg (130 lb), SpO2 95 %.  PHYSICAL EXAMINATION:  GENERAL:  71 y.o.-year-old patient lying in the bed with no acute distress.  EYES: Pupils equal, round, reactive to light and accommodation. No scleral icterus. Extraocular muscles intact.  HEENT: Head atraumatic, normocephalic. Oropharynx and nasopharynx clear. Pallor+ NECK:  Supple, no jugular venous  distention. No thyroid enlargement, no tenderness.  LUNGS: Normal breath sounds bilaterally, no wheezing, rales,rhonchi or crepitation. No use of accessory muscles of respiration.  CARDIOVASCULAR: S1, S2 normal. No murmurs, rubs, or gallops.  ABDOMEN: Soft, nontender, nondistended. Bowel sounds present. No organomegaly or mass.  EXTREMITIES: No pedal edema, cyanosis, or clubbing.  NEUROLOGIC: Cranial nerves II through XII are intact. Muscle strength 5/5 in all extremities. Sensation intact. Gait not checked.  PSYCHIATRIC: The patient is alert and oriented x 3.  SKIN: No obvious rash, lesion, or ulcer.   LABORATORY PANEL:   CBC  Recent Labs Lab 11/16/16 1531  WBC 4.9  HGB 5.8*  HCT 18.0*  PLT 220   ------------------------------------------------------------------------------------------------------------------  Chemistries   Recent Labs Lab 11/16/16 1531  NA 139  K 2.8*  CL 97*  CO2 33*  GLUCOSE 100*  BUN 10  CREATININE 2.88*  CALCIUM 9.2  AST 15  ALT 10*  ALKPHOS 686*  BILITOT 0.4   ------------------------------------------------------------------------------------------------------------------  Cardiac Enzymes  Recent Labs Lab 11/16/16 1531  TROPONINI 0.04*   ------------------------------------------------------------------------------------------------------------------  RADIOLOGY:  No results found.  EKG:    IMPRESSION AND PLAN:   Makilah Dowda  is a 71 y.o. female with a known history of Chronic atrial fibrillation who is now off anticoagulation, chronic diastolic heart failure, anemia of chronic disease, end-stage renal disease on hemodialysis Tuesday Thursday Saturday comes to the emergency room after she was told by her nephrologist that her hemoglobin is low. She was found to have hemoglobin of 5.8. Patient is asymptomatic. Her hemoglobin last week was 6.5.  1. Anemia due to Acute on chronic disease in setting of end-stage renal disease -Patient  was tested heme positive in the ER -Hemoglobin last week was 6.5--- today 5.8----2 units for transfusion----check hemoglobin in the morning -Hold aspirin and Plavix -EGD 2017 showed evidence of chronic gastritis. We'll continue PPI -Given holiday weekend GI coverage is not available at present. Patient is not actively bleeding. Hopefully GI workup  Can be done as outpatient.  2. ESRD on hemodialysis Nephrology consultation  3. Hypertension continue home meds  4. Hypokalemia  -Patient is on K Dur 40 daily for resume that  5. DVT prophylaxis SCD and teds given anemia  No family present in the emergency room.  Patient is from [place] [facility]. Care management for discharge planning. All the records are reviewed and case discussed with ED provider. Management plans discussed with the patient, family and they are in agreement.  CODE STATUS: full  TOTAL TIME TAKING CARE OF THIS PATIENT: 50 minutes.    Aaditya Letizia M.D on 11/16/2016 at 5:09 PM  Between 7am to 6pm - Pager -  [phone]  After 6pm go to [email or web address] - password [email or web address] [facility]  [facility]  Office  [phone]  CC: Primary care physician; Murlean Iba, MD

## 2016-11-16 NOTE — ED Notes (Signed)
Admission MD at bedside.  

## 2016-11-17 DIAGNOSIS — I132 Hypertensive heart and chronic kidney disease with heart failure and with stage 5 chronic kidney disease, or end stage renal disease: Secondary | ICD-10-CM | POA: Diagnosis not present

## 2016-11-17 LAB — CBC
HCT: 23.4 % — ABNORMAL LOW (ref 35.0–47.0)
HEMOGLOBIN: 7.9 g/dL — AB (ref 12.0–16.0)
MCH: 31.3 pg (ref 26.0–34.0)
MCHC: 33.8 g/dL (ref 32.0–36.0)
MCV: 92.6 fL (ref 80.0–100.0)
PLATELETS: 164 10*3/uL (ref 150–440)
RBC: 2.52 MIL/uL — AB (ref 3.80–5.20)
RDW: 20 % — ABNORMAL HIGH (ref 11.5–14.5)
WBC: 4.8 10*3/uL (ref 3.6–11.0)

## 2016-11-17 LAB — PREPARE RBC (CROSSMATCH)

## 2016-11-17 LAB — POTASSIUM: Potassium: 3.2 mmol/L — ABNORMAL LOW (ref 3.5–5.1)

## 2016-11-17 MED ORDER — POTASSIUM CHLORIDE CRYS ER 20 MEQ PO TBCR
40.0000 meq | EXTENDED_RELEASE_TABLET | Freq: Once | ORAL | Status: AC
  Administered 2016-11-17: 40 meq via ORAL
  Filled 2016-11-17: qty 2

## 2016-11-17 NOTE — Progress Notes (Signed)
[facility]  ROUNDING NOTE   Subjective:   [name] admitted to [facility] on 11/16/2016 for Gastrointestinal hemorrhage, unspecified gastrointestinal hemorrhage type [K92.2] Anemia, unspecified type [D64.9]  Last dialysis yesterday.   Received two units PRBC in the ED.   Hemoglobin of 7.9 from 5.8   Objective:  Vital signs in last 24 hours:  Temp:  [97.8 F (36.6 C)-98.9 F (37.2 C)] 98.4 F (36.9 C) (07/05 0508) Pulse Rate:  [47-62] 47 (07/05 0508) Resp:  [14-25] 20 (07/05 0508) BP: (113-148)/(38-100) 117/46 (07/05 0508) SpO2:  [93 %-100 %] 93 % (07/05 0508) Weight:  [59 kg (130 lb)-69.3 kg (152 lb 12.8 oz)] 69.3 kg (152 lb 12.8 oz) (07/04 2054)  Weight change:  Filed Weights   11/16/16 1526 11/16/16 2054  Weight: 59 kg (130 lb) 69.3 kg (152 lb 12.8 oz)    Intake/Output: I/O last 3 completed shifts: In: 640 [Blood:640] Out: -    Intake/Output this shift:  Total I/O In: 120 [P.O.:120] Out: -   Physical Exam: General: NAD, laying in bed  Head: Normocephalic, atraumatic. Moist oral mucosal membranes  Eyes: Anicteric, PERRL  Neck: Supple, trachea midline  Lungs:  Clear to auscultation  Heart: Regular rate and rhythm  Abdomen:  Soft, nontender,   Extremities:  no peripheral edema.  Neurologic: Nonfocal, moving all four extremities  Skin: No lesions  Access: Left AVF    Basic Metabolic Panel:  Recent Labs Lab 11/16/16 1531 11/17/16 0518  NA 139  --   K 2.8* 3.2*  CL 97*  --   CO2 33*  --   GLUCOSE 100*  --   BUN 10  --   CREATININE 2.88*  --   CALCIUM 9.2  --     Liver Function Tests:  Recent Labs Lab 11/16/16 1531  AST 15  ALT 10*  ALKPHOS 686*  BILITOT 0.4  PROT 7.2  ALBUMIN 3.6    Recent Labs Lab 11/16/16 1531  LIPASE 22   No results for input(s): AMMONIA in the last 168 hours.  CBC:  Recent Labs Lab 11/16/16 1531 11/17/16 0518  WBC 4.9 4.8  NEUTROABS 3.7  --   HGB 5.8* 7.9*  HCT 18.0* 23.4*  MCV  95.0 92.6  PLT 220 164    Cardiac Enzymes:  Recent Labs Lab 11/16/16 1531  TROPONINI 0.04*    BNP: Invalid input(s): POCBNP  CBG: No results for input(s): GLUCAP in the last 168 hours.  Microbiology: Results for orders placed or performed during the hospital encounter of 11/16/16  MRSA PCR Screening     Status: None   Collection Time: 11/16/16  9:35 PM  Result Value Ref Range Status   MRSA by PCR NEGATIVE NEGATIVE Final    Comment:        The GeneXpert MRSA Assay (FDA approved for NASAL specimens only), is one component of a comprehensive MRSA colonization surveillance program. It is not intended to diagnose MRSA infection nor to guide or monitor treatment for MRSA infections.     Coagulation Studies:  Recent Labs  11/16/16 1531  LABPROT 14.4  INR 1.11    Urinalysis: No results for input(s): COLORURINE, LABSPEC, PHURINE, GLUCOSEU, HGBUR, BILIRUBINUR, KETONESUR, PROTEINUR, UROBILINOGEN, NITRITE, LEUKOCYTESUR in the last 72 hours.  Invalid input(s): APPERANCEUR    Imaging: No results found.   Medications:   . sodium chloride     . amLODipine  10 mg Oral Daily  . atorvastatin  40 mg Oral Daily  .  calcium acetate  1,334 mg Oral TID WC  . carvedilol  3.125 mg Oral QHS  . gabapentin  300 mg Oral QHS  . loratadine  10 mg Oral Daily  . losartan  100 mg Oral Daily  . pantoprazole  40 mg Oral QAC breakfast  . traZODone  25 mg Oral QHS   acetaminophen **OR** acetaminophen, loperamide, ondansetron **OR** ondansetron (ZOFRAN) IV  Assessment/ Plan:  [name] is a 71 y.o. black female with ESRD on hemodialysis, GERD, anemia, atrial fibrillation, CVA, coronary artery disease, pulmonary hypertension, carotid artery stenosis   [facility] MWF [place].   1. End Stage Renal Disease: on hemodialysis. MWF schedule. With hypokalemia - hemodialysis yesterday.  - resume MWF schedule.   2. Anemia of chronic kidney disease: with GI bleed.  Hemoglobin 5.8 on admission. Status post 2 units PRBC.  - Appreciate GI input - EPO with HD treatment  3. Hypertension: some elevations.  - amlodipine, carvedilol, losartan.   4. Secondary Hyperparathyroidism:  - continue calcium acetate.     LOS: Toni Carlson, Toni Carlson 7/5/20182:21 PM

## 2016-11-17 NOTE — Care Management (Signed)
Patient admitted from [facility] for anemia.  RNCM consult placed for home health needs.  No RNCM needs identified.  Patient noted to be discharged on any new medications. Reported by nursing staff that patient is independent.   [facility] HD liaison notified that plan for patient to discharge today.  RNCM signing off.

## 2016-11-17 NOTE — NC FL2 (Signed)
[place] LEVEL OF CARE SCREENING TOOL     IDENTIFICATION  Patient Name: Toni Carlson Birthdate: 1945/12/15 Sex: female Admission Date (Current Location): 11/16/2016  [place] and [place] Number:  Engineering geologist and Address:  [facility], [address] [place], [place] [address]      Provider Number: [id number]  Attending Physician Name and Address:  Fritzi Mandes, MD  Relative Name and Phone Number:       Current Level of Care: Hospital Recommended Level of Care: [place] Prior Approval Number:    Date Approved/Denied:   PASRR Number:    Discharge Plan:  (ALF)    Current Diagnoses: Patient Active Problem List   Diagnosis Date Noted  . Atherosclerosis of artery of extremity with rest pain ([place]) 06/09/2016  . Atherosclerosis of native arteries of extremity with rest pain ([place]) 05/24/2016  . Gastric polyp 08/18/2015  . Anemia 07/15/2015  . GI bleed 07/15/2015  . Polyneuropathy 07/08/2015  . Degenerative disc disease, lumbar 07/08/2015  . Pancytopenia ([place]) 07/08/2015  . Generalized weakness 07/08/2015  . End stage renal disease ([place]) 07/08/2015  . Atherosclerotic peripheral vascular disease ([place]) 07/08/2015  . Pain in both feet 07/02/2015  . Gastric ulceration   . Acute GI bleeding   . GIB (gastrointestinal bleeding) 04/12/2015  . Left-sided weakness 04/01/2015  . Essential hypertension, malignant 04/01/2015  . Anemia of chronic disease 04/01/2015  . Elevated troponin 04/01/2015  . Pulmonary hypertension ([place]) 04/01/2015  . Atrial fibrillation ([place])   . CVA (cerebral infarction) 03/26/2015    Orientation RESPIRATION BLADDER Height & Weight     Self, Time, Situation, Place  Normal Continent Weight: 152 lb 12.8 oz (69.3 kg) Height:  5\' 6"  (167.6 cm)  BEHAVIORAL SYMPTOMS/MOOD NEUROLOGICAL BOWEL NUTRITION STATUS   (none)  (none) Continent Diet (renal with fluid restriction)  AMBULATORY STATUS  COMMUNICATION OF NEEDS Skin   Supervision Verbally Normal                       Personal Care Assistance Level of Assistance  Bathing, Dressing Bathing Assistance: Limited assistance   Dressing Assistance: Limited assistance     Functional Limitations Info   (no issues)          SPECIAL CARE FACTORS FREQUENCY                       Contractures Contractures Info: Not present    Additional Factors Info   (outpatient hemodialysis)               DISCHARGE MEDICATIONS:      Current Discharge Medication List       CONTINUE these medications which have NOT CHANGED   Details  amLODipine (NORVASC) 10 MG tablet Take 1 tablet (10 mg total) by mouth daily. Qty: 30 tablet, Refills: 0    aspirin EC 81 MG EC tablet Take 1 tablet (81 mg total) by mouth daily. Qty: 90 tablet, Refills: 3    atorvastatin (LIPITOR) 40 MG tablet Take 1 tablet (40 mg total) by mouth daily. Qty: 30 tablet, Refills: 0    calcium acetate (PHOSLO) 667 MG capsule Take 1,334 mg by mouth 3 (three) times daily with meals.    carvedilol (COREG) 6.25 MG tablet Take 1 tablet (6.25 mg total) by mouth 2 (two) times daily with a meal. Qty: 60 tablet, Refills: 0    cetirizine (ZYRTEC) 10 MG tablet Take 10 mg  by mouth daily.    clopidogrel (PLAVIX) 75 MG tablet Take 1 tablet (75 mg total) by mouth daily. Qty: 30 tablet, Refills: 5    gabapentin (NEURONTIN) 300 MG capsule Take 300 mg by mouth at bedtime.    loperamide (IMODIUM) 2 MG capsule Take 2 mg by mouth as needed for diarrhea or loose stools.    losartan (COZAAR) 100 MG tablet Take 1 tablet (100 mg total) by mouth daily. Qty: 30 tablet, Refills: 0    ondansetron (ZOFRAN) 4 MG tablet Take 4 mg by mouth every 8 (eight) hours as needed for nausea or vomiting.    pantoprazole (PROTONIX) 40 MG tablet Take 1 tablet (40 mg total) by mouth 2 (two) times daily. Qty: 60 tablet, Refills: 0    polyethylene glycol (MIRALAX /  GLYCOLAX) packet Take 17 g by mouth daily as needed for mild constipation. Qty: 14 each, Refills: 0    traZODone (DESYREL) 50 MG tablet Take 25 mg by mouth at bedtime.     trolamine salicylate (ASPERCREME) 10 % cream Apply topically 2 (two) times daily as needed for muscle pain. Qty: 85 g, Refills: 0        Shela Leff, LCSW

## 2016-11-17 NOTE — Discharge Summary (Signed)
[facility] [place] NAME: Toni Carlson    MR#:  [id number]  DATE OF BIRTH:  Nov 04, 1945  DATE OF ADMISSION:  11/16/2016 ADMITTING PHYSICIAN: Fritzi Mandes, MD  DATE OF DISCHARGE: 11/17/2016  PRIMARY CARE PHYSICIAN: Murlean Iba, MD    ADMISSION DIAGNOSIS:  Gastrointestinal hemorrhage, unspecified gastrointestinal hemorrhage type [K92.2] Anemia, unspecified type [D64.9]  DISCHARGE DIAGNOSIS:  Anemia of CKD  S/p 2 unit BT ESRD on HD  SECONDARY DIAGNOSIS:   Past Medical History:  Diagnosis Date  . A-fib (HCC)    a. on warfarin  . Anemia   . Carotid artery stenosis    a. ultrasound 03/2015: RICA 26-71% stenosis, LICA less than 24% stenosis  . Chronic diastolic CHF (congestive heart failure) ([place])    a. echo 03/2015: EF 60-65%, normal wall motion, diastolic parameters were c/w restrictive physiology, indicative of decreased LV diastolic compliance and/or increased LA pressure, mild AS, mild MR, left atrium was severely dilated, RA was severely dilated, PASP was severely increased at 90 mm Hg  . Coronary artery disease, non-occlusive    a. cardiac cath 2013  . ESRD (end stage renal disease) ([place])    On Tue-Thur-Sat dialysis  . H/O ischemic right MCA stroke    a. 03/2015; b. residual left-sided facial droop and slurred speech  . Hyperparathyroidism, secondary renal ([place])   . Hypertension   . Nicotine dependence   . Pulmonary HTN ([place])    as above  . Symptomatic bradycardia    a. history of symptomatic bradycardia; b. felt to be 2/2 metabolic abnormalities & required temp wire but no PPM, resolved with HD    HOSPITAL COURSE:  Toni Carlson  is a 71 y.o. female with a known history of Chronic atrial fibrillation who is now off anticoagulation, chronic diastolic heart failure, anemia of chronic disease, end-stage renal disease on hemodialysis Tuesday Thursday Saturday comes to the emergency room after she was told by her nephrologist that  her hemoglobin is low. She was found to have hemoglobin of 5.8. Patient is asymptomatic. Her hemoglobin last week was 6.5.  1. Anemia due to Acute on chronic disease in setting of end-stage renal disease -Patient was tested heme positive in the ER -Hemoglobin last week was 6.5--- today 5.8----2 units for transfusion----7.9 -will cont  aspirin and Plavix since pt has significant atherosclerotic dz and no active GI bleed. This was d/w dr Juleen China -pt will f/u Dr Allen Norris as out pt -EGD 2017 showed evidence of chronic gastritis. We'll continue PPI -Given holiday weekend GI coverage is not available at present. Patient is not actively bleeding. -GI workupn Can be done as outpatient---defer to Dr Holley Raring  2. ESRD on hemodialysis Nephrology consultation appreciated  3. Hypertension continue home meds  4. Hypokalemia  -2.8--3.2 -recieved one dose of k dur y'day  5. DVT prophylaxis SCD and teds given anemia  D/c to [facility]  CONSULTS OBTAINED:  Treatment Team:  Lavonia Dana, MD  DRUG ALLERGIES:  No Known Allergies  DISCHARGE MEDICATIONS:   Current Discharge Medication List    CONTINUE these medications which have NOT CHANGED   Details  amLODipine (NORVASC) 10 MG tablet Take 1 tablet (10 mg total) by mouth daily. Qty: 30 tablet, Refills: 0    aspirin EC 81 MG EC tablet Take 1 tablet (81 mg total) by mouth daily. Qty: 90 tablet, Refills: 3    atorvastatin (LIPITOR) 40 MG tablet Take 1 tablet (40 mg total) by mouth daily.  Qty: 30 tablet, Refills: 0    calcium acetate (PHOSLO) 667 MG capsule Take 1,334 mg by mouth 3 (three) times daily with meals.    carvedilol (COREG) 6.25 MG tablet Take 1 tablet (6.25 mg total) by mouth 2 (two) times daily with a meal. Qty: 60 tablet, Refills: 0    cetirizine (ZYRTEC) 10 MG tablet Take 10 mg by mouth daily.    clopidogrel (PLAVIX) 75 MG tablet Take 1 tablet (75 mg total) by mouth daily. Qty: 30 tablet, Refills: 5    gabapentin  (NEURONTIN) 300 MG capsule Take 300 mg by mouth at bedtime.    loperamide (IMODIUM) 2 MG capsule Take 2 mg by mouth as needed for diarrhea or loose stools.    losartan (COZAAR) 100 MG tablet Take 1 tablet (100 mg total) by mouth daily. Qty: 30 tablet, Refills: 0    ondansetron (ZOFRAN) 4 MG tablet Take 4 mg by mouth every 8 (eight) hours as needed for nausea or vomiting.    pantoprazole (PROTONIX) 40 MG tablet Take 1 tablet (40 mg total) by mouth 2 (two) times daily. Qty: 60 tablet, Refills: 0    polyethylene glycol (MIRALAX / GLYCOLAX) packet Take 17 g by mouth daily as needed for mild constipation. Qty: 14 each, Refills: 0    traZODone (DESYREL) 50 MG tablet Take 25 mg by mouth at bedtime.     trolamine salicylate (ASPERCREME) 10 % cream Apply topically 2 (two) times daily as needed for muscle pain. Qty: 85 g, Refills: 0        If you experience worsening of your admission symptoms, develop shortness of breath, life threatening emergency, suicidal or homicidal thoughts you must seek medical attention immediately by calling 911 or calling your MD immediately  if symptoms less severe.  You Must read complete instructions/literature along with all the possible adverse reactions/side effects for all the Medicines you take and that have been prescribed to you. Take any new Medicines after you have completely understood and accept all the possible adverse reactions/side effects.   Please note  You were cared for by a hospitalist during your hospital stay. If you have any questions about your discharge medications or the care you received while you were in the hospital after you are discharged, you can call the unit and asked to speak with the hospitalist on call if the hospitalist that took care of you is not available. Once you are discharged, your primary care physician will handle any further medical issues. Please note that NO REFILLS for any discharge medications will be authorized once  you are discharged, as it is imperative that you return to your primary care physician (or establish a relationship with a primary care physician if you do not have one) for your aftercare needs so that they can reassess your need for medications and monitor your lab values. Today   SUBJECTIVE   I want to go  VITAL SIGNS:  Blood pressure (!) 117/46, pulse (!) 47, temperature 98.4 F (36.9 C), temperature source Oral, resp. rate 20, height 5\' 6"  (1.676 m), weight 69.3 kg (152 lb 12.8 oz), SpO2 93 %.  I/O:   Intake/Output Summary (Last 24 hours) at 11/17/16 1039 Last data filed at 11/17/16 1006  Gross per 24 hour  Intake              760 ml  Output                0 ml  Net  760 ml    PHYSICAL EXAMINATION:  GENERAL:  71 y.o.-year-old patient lying in the bed with no acute distress.  EYES: Pupils equal, round, reactive to light and accommodation. No scleral icterus. Extraocular muscles intact.  HEENT: Head atraumatic, normocephalic. Oropharynx and nasopharynx clear.  NECK:  Supple, no jugular venous distention. No thyroid enlargement, no tenderness.  LUNGS: Normal breath sounds bilaterally, no wheezing, rales,rhonchi or crepitation. No use of accessory muscles of respiration.  CARDIOVASCULAR: S1, S2 normal. No murmurs, rubs, or gallops.  ABDOMEN: Soft, non-tender, non-distended. Bowel sounds present. No organomegaly or mass.  EXTREMITIES: No pedal edema, cyanosis, or clubbing.  NEUROLOGIC: Cranial nerves II through XII are intact. Muscle strength 5/5 in all extremities. Sensation intact. Gait not checked.  PSYCHIATRIC: The patient is alert and oriented x 3.  SKIN: No obvious rash, lesion, or ulcer.   DATA REVIEW:   CBC   Recent Labs Lab 11/17/16 0518  WBC 4.8  HGB 7.9*  HCT 23.4*  PLT 164    Chemistries   Recent Labs Lab 11/16/16 1531 11/17/16 0518  NA 139  --   K 2.8* 3.2*  CL 97*  --   CO2 33*  --   GLUCOSE 100*  --   BUN 10  --   CREATININE 2.88*   --   CALCIUM 9.2  --   AST 15  --   ALT 10*  --   ALKPHOS 686*  --   BILITOT 0.4  --     Microbiology Results   Recent Results (from the past 240 hour(s))  MRSA PCR Screening     Status: None   Collection Time: 11/16/16  9:35 PM  Result Value Ref Range Status   MRSA by PCR NEGATIVE NEGATIVE Final    Comment:        The GeneXpert MRSA Assay (FDA approved for NASAL specimens only), is one component of a comprehensive MRSA colonization surveillance program. It is not intended to diagnose MRSA infection nor to guide or monitor treatment for MRSA infections.     RADIOLOGY:  No results found.   Management plans discussed with the patient, family and they are in agreement.  CODE STATUS:     Code Status Orders        Start     Ordered   11/16/16 2052  Full code  Continuous     11/16/16 2051    Code Status History    Date Active Date Inactive Code Status Order ID Comments User Context   06/09/2016  6:39 PM 06/10/2016  6:35 PM Full Code [id number]  Algernon Huxley, MD Inpatient   07/15/2015  1:10 PM 07/17/2015  4:30 PM Full Code [id number]  Hillary Bow, MD ED   07/02/2015  5:23 PM 07/08/2015  8:09 PM Full Code [id number]  Gladstone Lighter, MD Inpatient   04/12/2015  1:03 PM 04/15/2015  6:00 PM DNR [id number]  Bettey Costa, MD Inpatient   03/26/2015  4:05 PM 04/01/2015  8:24 PM Full Code [id number]  Gladstone Lighter, MD Inpatient   02/23/2015  9:39 AM 02/23/2015  1:41 PM Full Code [id number]  Algernon Huxley, MD Inpatient      TOTAL TIME TAKING CARE OF THIS PATIENT: 40 minutes.    Jahzir Strohmeier M.D on 11/17/2016 at 10:39 AM  Between 7am to 6pm - Pager - [phone] After 6pm go to [email or web address] - password [email or web address] [facility]  Office  [phone]  CC: Primary care physician; Murlean Iba, MD

## 2016-11-17 NOTE — Discharge Instructions (Signed)
Anemia, Nonspecific Anemia is a condition in which the concentration of red blood cells or hemoglobin in the blood is below normal. Hemoglobin is a substance in red blood cells that carries oxygen to the tissues of the body. Anemia results in not enough oxygen reaching these tissues. What are the causes? Common causes of anemia include:  Excessive bleeding. Bleeding may be internal or external. This includes excessive bleeding from periods (in women) or from the intestine.  Poor nutrition.  Chronic kidney, thyroid, and liver disease.  Bone marrow disorders that decrease red blood cell production.  Cancer and treatments for cancer.  HIV, AIDS, and their treatments.  Spleen problems that increase red blood cell destruction.  Blood disorders.  Excess destruction of red blood cells due to infection, medicines, and autoimmune disorders. What are the signs or symptoms?  Minor weakness.  Dizziness.  Headache.  Palpitations.  Shortness of breath, especially with exercise.  Paleness.  Cold sensitivity.  Indigestion.  Nausea.  Difficulty sleeping.  Difficulty concentrating. Symptoms may occur suddenly or they may develop slowly. How is this diagnosed? Additional blood tests are often needed. These help your health care provider determine the best treatment. Your health care provider will check your stool for blood and look for other causes of blood loss. How is this treated? Treatment varies depending on the cause of the anemia. Treatment can include:  Supplements of iron, vitamin B12, or folic acid.  Hormone medicines.  A blood transfusion. This may be needed if blood loss is severe.  Hospitalization. This may be needed if there is significant continual blood loss.  Dietary changes.  Spleen removal. Follow these instructions at home: Keep all follow-up appointments. It often takes many weeks to correct anemia, and having your health care provider check on your  condition and your response to treatment is very important. Get help right away if:  You develop extreme weakness, shortness of breath, or chest pain.  You become dizzy or have trouble concentrating.  You develop heavy vaginal bleeding.  You develop a rash.  You have bloody or black, tarry stools.  You faint.  You vomit up blood.  You vomit repeatedly.  You have abdominal pain.  You have a fever or persistent symptoms for more than 2-3 days.  You have a fever and your symptoms suddenly get worse.  You are dehydrated. This information is not intended to replace advice given to you by your health care provider. Make sure you discuss any questions you have with your health care provider. Document Released: 06/09/2004 Document Revised: 10/14/2015 Document Reviewed: 10/26/2012 Elsevier Interactive Patient Education  2017 [employer]  

## 2016-11-17 NOTE — Progress Notes (Signed)
Patient discharged to [facility]. Update provided by Clinical Education officer, museum. IV removed. Concerns addressed.

## 2016-11-17 NOTE — Care Management Obs Status (Signed)
[facility] NOTIFICATION   Patient Details  Name: Zhanae Proffit MRN: [id number] Date of Birth: July 23, 1945   Medicare Observation Status Notification Given:  Yes    Beverly Sessions, RN 11/17/2016, 12:12 PM

## 2016-11-17 NOTE — Progress Notes (Signed)
Patient was transferred from the ED following low hemoglobin of 5.8. On admission to the unit patient was A&O X4, denied being in any pain or discomfort. Patient was oriented to the unit, MRSA swap done and came back negative. Patient was informed of blood transfusion order, consent was signed in the ED and was confirmed with patient. Patient was educated about possible blood reactions. She stated that she received blood transfusion before without reaction.  2 units of blood was transfused per order according to policy. Patient tolerated the transfusion without any reaction. Patient has no acute event overnight. Hemoglobin recheck scheduled for AM.

## 2016-11-17 NOTE — Care Management Important Message (Signed)
Important Message  Patient Details  Name: Toni Carlson MRN: [id number] Date of Birth: Jun 27, 1945   Medicare Important Message Given:  N/A - LOS <3 / Initial given by admissions    Beverly Sessions, RN 11/17/2016, 11:02 AM

## 2016-11-17 NOTE — Care Management CC44 (Signed)
Condition Code 44 Documentation Completed  Patient Details  Name: Stehanie Ekstrom MRN: [id number] Date of Birth: 1945/05/27   Condition Code 44 given:  Yes Patient signature on Condition Code 44 notice:  Yes Documentation of 2 MD's agreement:    Code 44 added to claim:       Beverly Sessions, RN 11/17/2016, 12:12 PM

## 2016-11-17 NOTE — Clinical Social Work Note (Signed)
Clinical Social Work Assessment  Patient Details  Name: Toni Carlson MRN: [id number] Date of Birth: [birth year]  Date of referral:  11/17/16               Reason for consult:  Facility Placement                Permission sought to share information with:  Facility Art therapist granted to share information::  Yes, Verbal Permission Granted  Name::        Agency::     Relationship::     Contact Information:     Housing/Transportation Living arrangements for the past 2 months:  [place] of Information:  Patient Patient Interpreter Needed:  None Criminal Activity/Legal Involvement Pertinent to Current Situation/Hospitalization:  No - Comment as needed Significant Relationships:  Adult Children Lives with:  Facility Resident Do you feel safe going back to the place where you live?  Yes Need for family participation in patient care:  No (Coment)  Care giving concerns:  Patient resides at [facility] assisted living.   Social Worker assessment / plan:  Patient is alert and oriented X4 and is independent in ADL's. MD to discharge patient to return to [facility] today. Laverne at [facility] is aware and FL2 faxed via Epic. Patient is aware of discharge and states she does not have anyone to transport her. CSW spoke with Laverne at [facility] and they will come pick her up in their [place] about 12. Patient is aware.  Employment status:  Retired Forensic scientist:  Medicare PT Recommendations:    Information / Referral to community resources:     Patient/Family's Response to care:  Patient expressed appreciation for CSW assistance.  Patient/Family's Understanding of and Emotional Response to Diagnosis, Current Treatment, and Prognosis:  Patient is aware of her limitations and her abilities and is in agreement with discharge today.  Emotional Assessment Appearance:  Appears younger than stated age Attitude/Demeanor/Rapport:    (pleasant and cooperative) Affect (typically observed):  Accepting, Adaptable, Calm, Appropriate, Pleasant Orientation:  Oriented to Self, Oriented to Place, Oriented to  Time, Oriented to Situation Alcohol / Substance use:  Not Applicable Psych involvement (Current and /or in the community):  No (Comment)  Discharge Needs  Concerns to be addressed:  Care Coordination Readmission within the last 30 days:  Yes Current discharge risk:  None Barriers to Discharge:  No Barriers Identified   Shela Leff, LCSW 11/17/2016, 11:06 AM

## 2016-11-18 LAB — BPAM RBC
BLOOD PRODUCT EXPIRATION DATE: 201807112359
Blood Product Expiration Date: 201807112359
ISSUE DATE / TIME: 201807050006
ISSUE DATE / TIME: 201807042103
UNIT TYPE AND RH: 6200
Unit Type and Rh: 6200

## 2016-11-18 LAB — TYPE AND SCREEN
ABO/RH(D): A POS
Antibody Screen: NEGATIVE
UNIT DIVISION: 0
Unit division: 0

## 2016-11-23 ENCOUNTER — Other Ambulatory Visit (INDEPENDENT_AMBULATORY_CARE_PROVIDER_SITE_OTHER): Payer: Self-pay | Admitting: Vascular Surgery

## 2016-11-23 DIAGNOSIS — N185 Chronic kidney disease, stage 5: Secondary | ICD-10-CM

## 2016-11-24 ENCOUNTER — Ambulatory Visit (INDEPENDENT_AMBULATORY_CARE_PROVIDER_SITE_OTHER): Payer: Medicare Other

## 2016-11-24 ENCOUNTER — Ambulatory Visit (INDEPENDENT_AMBULATORY_CARE_PROVIDER_SITE_OTHER): Payer: Medicare Other | Admitting: Vascular Surgery

## 2016-11-24 ENCOUNTER — Encounter (INDEPENDENT_AMBULATORY_CARE_PROVIDER_SITE_OTHER): Payer: Self-pay | Admitting: Vascular Surgery

## 2016-11-24 VITALS — BP 173/71 | HR 57 | Resp 16 | Wt 158.0 lb

## 2016-11-24 DIAGNOSIS — N186 End stage renal disease: Secondary | ICD-10-CM | POA: Diagnosis not present

## 2016-11-24 DIAGNOSIS — I10 Essential (primary) hypertension: Secondary | ICD-10-CM

## 2016-11-24 DIAGNOSIS — I70209 Unspecified atherosclerosis of native arteries of extremities, unspecified extremity: Secondary | ICD-10-CM | POA: Diagnosis not present

## 2016-11-24 DIAGNOSIS — I480 Paroxysmal atrial fibrillation: Secondary | ICD-10-CM | POA: Diagnosis not present

## 2016-11-24 DIAGNOSIS — N185 Chronic kidney disease, stage 5: Secondary | ICD-10-CM

## 2016-11-24 DIAGNOSIS — T829XXS Unspecified complication of cardiac and vascular prosthetic device, implant and graft, sequela: Secondary | ICD-10-CM

## 2016-11-24 NOTE — Progress Notes (Signed)
MRN : [id number]  Toni Carlson is a 71 y.o. ([birth year]) female who presents with chief complaint of  Chief Complaint  Patient presents with  . Follow-up  .  History of Present Illness: The patient returns to the office for followup status post intervention of the dialysis access on 10/25/2016.  PROCEDURE: 1. Contrast injection left brachiocephalic  AV access 2. Percutaneous transluminal angioplasty and stent placement peripheral segment to 8 mm 3. Percutaneous transluminal angioplasty central venous segment to 12 mm with a [employer] drug-eluting balloon   Following the intervention the access function has significantly improved, with better flow rates and improved KT/V. The patient has not been experiencing increased bleeding times following decannulation and the patient denies increased recirculation. The patient denies an increase in arm swelling. At the present time the patient denies hand pain.  The patient denies amaurosis fugax or recent TIA symptoms. There are no recent neurological changes noted. The patient denies claudication symptoms or rest pain symptoms. The patient denies history of DVT, PE or superficial thrombophlebitis. The patient denies recent episodes of angina or shortness of breath.       Current Meds  Medication Sig  . amLODipine (NORVASC) 10 MG tablet Take 1 tablet (10 mg total) by mouth daily.  Marland Kitchen aspirin EC 81 MG EC tablet Take 1 tablet (81 mg total) by mouth daily.  Marland Kitchen atorvastatin (LIPITOR) 40 MG tablet Take 1 tablet (40 mg total) by mouth daily.  . calcium acetate (PHOSLO) 667 MG capsule Take 1,334 mg by mouth 3 (three) times daily with meals.  . carvedilol (COREG) 6.25 MG tablet Take 1 tablet (6.25 mg total) by mouth 2 (two) times daily with a meal. (Patient taking differently: Take 3.125 mg by mouth at bedtime. )  . cetirizine (ZYRTEC) 10 MG tablet Take 10 mg by mouth daily.  . clopidogrel (PLAVIX) 75 MG tablet Take 1 tablet (75 mg total) by mouth  daily.  Marland Kitchen gabapentin (NEURONTIN) 300 MG capsule Take 300 mg by mouth at bedtime.  Marland Kitchen loperamide (IMODIUM) 2 MG capsule Take 2 mg by mouth as needed for diarrhea or loose stools.  Marland Kitchen losartan (COZAAR) 100 MG tablet Take 1 tablet (100 mg total) by mouth daily.  . ondansetron (ZOFRAN) 4 MG tablet Take 4 mg by mouth every 8 (eight) hours as needed for nausea or vomiting.  . pantoprazole (PROTONIX) 40 MG tablet Take 1 tablet (40 mg total) by mouth 2 (two) times daily. (Patient taking differently: Take 40 mg by mouth daily. )  . polyethylene glycol (MIRALAX / GLYCOLAX) packet Take 17 g by mouth daily as needed for mild constipation.  . traZODone (DESYREL) 50 MG tablet Take 25 mg by mouth at bedtime.   . trolamine salicylate (ASPERCREME) 10 % cream Apply topically 2 (two) times daily as needed for muscle pain.    Past Medical History:  Diagnosis Date  . A-fib (HCC)    a. on warfarin  . Anemia   . Carotid artery stenosis    a. ultrasound 03/2015: RICA 10-17% stenosis, LICA less than 51% stenosis  . Chronic diastolic CHF (congestive heart failure) ([place])    a. echo 03/2015: EF 60-65%, normal wall motion, diastolic parameters were c/w restrictive physiology, indicative of decreased LV diastolic compliance and/or increased LA pressure, mild AS, mild MR, left atrium was severely dilated, RA was severely dilated, PASP was severely increased at 90 mm Hg  . Coronary artery disease, non-occlusive    a. cardiac cath 2013  .  ESRD (end stage renal disease) ([place])    On Tue-Thur-Sat dialysis  . H/O ischemic right MCA stroke    a. 03/2015; b. residual left-sided facial droop and slurred speech  . Hyperparathyroidism, secondary renal ([place])   . Hypertension   . Nicotine dependence   . Pulmonary HTN ([place])    as above  . Symptomatic bradycardia    a. history of symptomatic bradycardia; b. felt to be 2/2 metabolic abnormalities & required temp wire but no PPM, resolved with HD    Past Surgical History:    Procedure Laterality Date  . A/V SHUNTOGRAM Left 10/25/2016   Procedure: A/V Fistulagram;  Surgeon: Katha Cabal, MD;  Location: [facility];  Service: Cardiovascular;  Laterality: Left;  . ESOPHAGOGASTRODUODENOSCOPY (EGD) WITH PROPOFOL N/A 04/14/2015   Procedure: ESOPHAGOGASTRODUODENOSCOPY (EGD) WITH PROPOFOL;  Surgeon: Lucilla Lame, MD;  Location: [facility];  Service: Endoscopy;  Laterality: N/A;  . ESOPHAGOGASTRODUODENOSCOPY (EGD) WITH PROPOFOL N/A 07/17/2015   Procedure: ESOPHAGOGASTRODUODENOSCOPY (EGD) WITH PROPOFOL;  Surgeon: Josefine Class, MD;  Location: [facility] END[facility]COPY;  Service: Endoscopy;  Laterality: N/A;  . EUS N/A 09/24/2015   Procedure: UPPER ENDOSCOPIC ULTRASOUND (EUS) LINEAR;  Surgeon: Jola Schmidt, MD;  Location: [facility] ENDOSCOPY;  Service: Endoscopy;  Laterality: N/A;  . LOWER EXTREMITY ANGIOGRAPHY Right 06/09/2016   Procedure: Lower Extremity Angiography;  Surgeon: Algernon Huxley, MD;  Location: [facility];  Service: Cardiovascular;  Laterality: Right;  . PERIPHERAL VASCULAR CATHETERIZATION N/A 02/23/2015   Procedure: A/V Shuntogram/Fistulagram;  Surgeon: Algernon Huxley, MD;  Location: [facility];  Service: Cardiovascular;  Laterality: N/A;  . PERIPHERAL VASCULAR CATHETERIZATION N/A 02/23/2015   Procedure: A/V Shunt Intervention;  Surgeon: Algernon Huxley, MD;  Location: [facility];  Service: Cardiovascular;  Laterality: N/A;    Social History Social History  Substance Use Topics  . Smoking status: Former Smoker    Packs/day: 0.25    Years: 30.00    Types: Cigarettes    Quit date: 11/20/2013  . Smokeless tobacco: Never Used  . Alcohol use No    Family History Family History  Problem Relation Age of Onset  . CAD Father   . Dementia Mother     No Known Allergies   REVIEW OF SYSTEMS (Negative unless checked)  Constitutional: [] Weight loss  [] Fever  [] Chills Cardiac: [] Chest pain   [] Chest pressure   [] Palpitations    [] Shortness of breath when laying flat   [] Shortness of breath with exertion. Vascular:  [] Pain in legs with walking   [] Pain in legs at rest  [] History of DVT   [] Phlebitis   [] Swelling in legs   [] Varicose veins   [] Non-healing ulcers Pulmonary:   [] Uses home oxygen   [] Productive cough   [] Hemoptysis   [] Wheeze  [] COPD   [] Asthma Neurologic:  [] Dizziness   [] Seizures   [] History of stroke   [] History of TIA  [] Aphasia   [] Vissual changes   [] Weakness or numbness in arm   [] Weakness or numbness in leg Musculoskeletal:   [] Joint swelling   [] Joint pain   [] Low back pain Hematologic:  [] Easy bruising  [] Easy bleeding   [] Hypercoagulable state   [] Anemic Gastrointestinal:  [] Diarrhea   [] Vomiting  [] Gastroesophageal reflux/heartburn   [] Difficulty swallowing. Genitourinary:  [x] Chronic kidney disease   [] Difficult urination  [] Frequent urination   [] Blood in urine Skin:  [] Rashes   [] Ulcers  Psychological:  [] History of anxiety   []  History of major depression.  Physical Examination  Vitals:   11/24/16 1527  BP: (!) 173/71  Pulse: (!) 57  Resp: 16  Weight: 158 lb (71.7 kg)   Body mass index is 25.5 kg/m. Gen: WD/WN, NAD Head: [place]/AT, No temporalis wasting.  Ear/Nose/Throat: Hearing grossly intact, nares w/o erythema or drainage Eyes: PER, EOMI, sclera nonicteric.  Neck: Supple, no large masses.   Pulmonary:  Good air movement, no audible wheezing bilaterally, no use of accessory muscles.  Cardiac: RRR, no JVD Vascular: AV access good thrill good bruit Vessel Right Left  Radial Palpable Palpable  Ulnar Palpable Palpable  Brachial Palpable Palpable  Gastrointestinal: Non-distended. No guarding/no peritoneal signs.  Musculoskeletal: M/S 5/5 throughout.  No deformity or atrophy.  Neurologic: CN 2-12 intact. Symmetrical.  Speech is fluent. Motor exam as listed above. Psychiatric: Judgment intact, Mood & affect appropriate for pt's clinical situation. Dermatologic: No rashes or ulcers  noted.  No changes consistent with cellulitis. Lymph : No lichenification or skin changes of chronic lymphedema.  CBC Lab Results  Component Value Date   WBC 4.8 11/17/2016   HGB 7.9 (L) 11/17/2016   HCT 23.4 (L) 11/17/2016   MCV 92.6 11/17/2016   PLT 164 11/17/2016    BMET    Component Value Date/Time   NA 139 11/16/2016 1531   NA 137 11/26/2013 0650   K 3.2 (L) 11/17/2016 0518   K 5.2 (H) 11/26/2013 0650   CL 97 (L) 11/16/2016 1531   CL 102 11/26/2013 0650   CO2 33 (H) 11/16/2016 1531   CO2 26 11/26/2013 0650   GLUCOSE 100 (H) 11/16/2016 1531   GLUCOSE 87 11/26/2013 0650   BUN 10 11/16/2016 1531   BUN 52 (H) 11/26/2013 0650   CREATININE 2.88 (H) 11/16/2016 1531   CREATININE 9.20 (H) 11/26/2013 0650   CALCIUM 9.2 11/16/2016 1531   CALCIUM 8.8 11/26/2013 0650   GFRNONAA 16 (L) 11/16/2016 1531   GFRNONAA 4 (L) 11/26/2013 0650   GFRAA 18 (L) 11/16/2016 1531   GFRAA 5 (L) 11/26/2013 0650   Estimated Creatinine Clearance: 19 mL/min (A) (by C-G formula based on SCr of 2.88 mg/dL (H)).  COAG Lab Results  Component Value Date   INR 1.11 11/16/2016   INR 0.98 05/12/2016   INR 1.14 07/15/2015    Radiology No results found.  Assessment/Plan 1. End stage renal disease ([place]) Recommend:  The patient is doing well and currently has adequate dialysis access. The patient's dialysis center is not reporting any access issues. Flow pattern is stable when compared to the prior ultrasound.  The patient should have a duplex ultrasound of the dialysis access in 6 months. The patient will follow-up with me in the office after each ultrasound    - VAS [place] (AVF,AVG); Future  2. Complication from renal dialysis device, sequela Patient has a stricture of hte AV fistula that must be monitored to prevent loss of access  3. Atherosclerotic peripheral vascular disease (HCC)  Recommend:  The patient has evidence of atherosclerosis of the lower extremities  with claudication.  The patient does not voice lifestyle limiting changes at this point in time.  Noninvasive studies do not suggest clinically significant change.  No invasive studies, angiography or surgery at this time The patient should continue walking and begin a more formal exercise program.  The patient should continue antiplatelet therapy and aggressive treatment of the lipid abnormalities  No changes in the patient's medications at this time  The patient should continue wearing graduated compression socks 10-15 mmHg  strength to control the mild edema.    4. Paroxysmal atrial fibrillation (HCC) Continue antiarrhythmia medications as already ordered, these medications have been reviewed and there are no changes at this time.  Continue anticoagulation as ordered by Cardiology Service   5. Essential hypertension, malignant Continue antihypertensive medications as already ordered, these medications have been reviewed and there are no changes at this time.    Hortencia Pilar, MD  11/24/2016 3:46 PM

## 2016-11-27 DIAGNOSIS — T829XXA Unspecified complication of cardiac and vascular prosthetic device, implant and graft, initial encounter: Secondary | ICD-10-CM | POA: Insufficient documentation

## 2016-12-01 ENCOUNTER — Ambulatory Visit: Payer: Medicare Other | Admitting: Gastroenterology

## 2016-12-01 ENCOUNTER — Encounter: Payer: Self-pay | Admitting: Gastroenterology

## 2016-12-01 NOTE — Progress Notes (Deleted)
Primary Care Physician: Murlean Iba, MD  Primary Gastroenterologist:  Dr. Lucilla Lame  No chief complaint on file.   HPI: Toni Carlson is a 71 y.o. female here follow-up after being in the hospital.  The patient had been seen by me back in January for anemia and was told that she needed to undergo a colonoscopy.  The patient had not followed up with me and did not go through with a colonoscopy.  The patient was then admitted at the beginning of July to the hospital with a hemoglobin of 5.8 and discharged with a follow-up with me.  The patient is on chronic hemodialysis. The patient was found to be Hemoccult positive in the emergency room.  Current Outpatient Prescriptions  Medication Sig Dispense Refill  . amLODipine (NORVASC) 10 MG tablet Take 1 tablet (10 mg total) by mouth daily. 30 tablet 0  . aspirin EC 81 MG EC tablet Take 1 tablet (81 mg total) by mouth daily. 90 tablet 3  . atorvastatin (LIPITOR) 40 MG tablet Take 1 tablet (40 mg total) by mouth daily. 30 tablet 0  . calcium acetate (PHOSLO) 667 MG capsule Take 1,334 mg by mouth 3 (three) times daily with meals.    . carvedilol (COREG) 6.25 MG tablet Take 1 tablet (6.25 mg total) by mouth 2 (two) times daily with a meal. (Patient taking differently: Take 3.125 mg by mouth at bedtime. ) 60 tablet 0  . cetirizine (ZYRTEC) 10 MG tablet Take 10 mg by mouth daily.    . clopidogrel (PLAVIX) 75 MG tablet Take 1 tablet (75 mg total) by mouth daily. 30 tablet 5  . gabapentin (NEURONTIN) 300 MG capsule Take 300 mg by mouth at bedtime.    Marland Kitchen loperamide (IMODIUM) 2 MG capsule Take 2 mg by mouth as needed for diarrhea or loose stools.    Marland Kitchen losartan (COZAAR) 100 MG tablet Take 1 tablet (100 mg total) by mouth daily. 30 tablet 0  . ondansetron (ZOFRAN) 4 MG tablet Take 4 mg by mouth every 8 (eight) hours as needed for nausea or vomiting.    . pantoprazole (PROTONIX) 40 MG tablet Take 1 tablet (40 mg total) by mouth 2 (two) times daily.  (Patient taking differently: Take 40 mg by mouth daily. ) 60 tablet 0  . polyethylene glycol (MIRALAX / GLYCOLAX) packet Take 17 g by mouth daily as needed for mild constipation. 14 each 0  . traZODone (DESYREL) 50 MG tablet Take 25 mg by mouth at bedtime.     . trolamine salicylate (ASPERCREME) 10 % cream Apply topically 2 (two) times daily as needed for muscle pain. 85 g 0   No current facility-administered medications for this visit.     Allergies as of 12/01/2016  . (No Known Allergies)    ROS:  General: Negative for anorexia, weight loss, fever, chills, fatigue, weakness. ENT: Negative for hoarseness, difficulty swallowing , nasal congestion. CV: Negative for chest pain, angina, palpitations, dyspnea on exertion, peripheral edema.  Respiratory: Negative for dyspnea at rest, dyspnea on exertion, cough, sputum, wheezing.  GI: See history of present illness. GU:  Negative for dysuria, hematuria, urinary incontinence, urinary frequency, nocturnal urination.  Endo: Negative for unusual weight change.    Physical Examination:   LMP  (LMP Unknown)   General: Well-nourished, well-developed in no acute distress.  Eyes: No icterus. Conjunctivae pink. Mouth: Oropharyngeal mucosa moist and pink , no lesions erythema or exudate. Lungs: Clear to auscultation bilaterally. Non-labored. Heart: Regular rate  and rhythm, no murmurs rubs or gallops.  Abdomen: Bowel sounds are normal, nontender, nondistended, no hepatosplenomegaly or masses, no abdominal bruits or hernia , no rebound or guarding.   Extremities: No lower extremity edema. No clubbing or deformities. Neuro: Alert and oriented x 3.  Grossly intact. Skin: Warm and dry, no jaundice.   Psych: Alert and cooperative, normal mood and affect.  Labs:  ***  Imaging Studies: No results found.  Assessment and Plan:   Toni Carlson is a 71 y.o. y/o female ***    Lucilla Lame, MD. Marval Regal   Note: This dictation was prepared with  Dragon dictation along with smaller phrase technology. Any transcriptional errors that result from this process are unintentional.

## 2016-12-17 ENCOUNTER — Inpatient Hospital Stay
Admission: EM | Admit: 2016-12-17 | Discharge: 2016-12-19 | DRG: 393 | Disposition: A | Discharge status: Nursing Home (Includes ICF) | Payer: Medicare Other | Attending: Internal Medicine | Admitting: Internal Medicine

## 2016-12-17 ENCOUNTER — Encounter: Payer: Self-pay | Admitting: Emergency Medicine

## 2016-12-17 ENCOUNTER — Inpatient Hospital Stay: Payer: Medicare Other

## 2016-12-17 ENCOUNTER — Emergency Department: Payer: Medicare Other

## 2016-12-17 DIAGNOSIS — N186 End stage renal disease: Secondary | ICD-10-CM | POA: Diagnosis not present

## 2016-12-17 DIAGNOSIS — I132 Hypertensive heart and chronic kidney disease with heart failure and with stage 5 chronic kidney disease, or end stage renal disease: Secondary | ICD-10-CM | POA: Diagnosis present

## 2016-12-17 DIAGNOSIS — I2510 Atherosclerotic heart disease of native coronary artery without angina pectoris: Secondary | ICD-10-CM | POA: Diagnosis present

## 2016-12-17 DIAGNOSIS — I482 Chronic atrial fibrillation: Secondary | ICD-10-CM | POA: Diagnosis present

## 2016-12-17 DIAGNOSIS — Z992 Dependence on renal dialysis: Secondary | ICD-10-CM

## 2016-12-17 DIAGNOSIS — E876 Hypokalemia: Secondary | ICD-10-CM | POA: Diagnosis present

## 2016-12-17 DIAGNOSIS — Z8673 Personal history of transient ischemic attack (TIA), and cerebral infarction without residual deficits: Secondary | ICD-10-CM | POA: Diagnosis not present

## 2016-12-17 DIAGNOSIS — R0602 Shortness of breath: Secondary | ICD-10-CM

## 2016-12-17 DIAGNOSIS — K219 Gastro-esophageal reflux disease without esophagitis: Secondary | ICD-10-CM | POA: Diagnosis present

## 2016-12-17 DIAGNOSIS — Z87891 Personal history of nicotine dependence: Secondary | ICD-10-CM | POA: Diagnosis not present

## 2016-12-17 DIAGNOSIS — I5032 Chronic diastolic (congestive) heart failure: Secondary | ICD-10-CM | POA: Diagnosis present

## 2016-12-17 DIAGNOSIS — I272 Pulmonary hypertension, unspecified: Secondary | ICD-10-CM | POA: Diagnosis present

## 2016-12-17 DIAGNOSIS — K559 Vascular disorder of intestine, unspecified: Secondary | ICD-10-CM | POA: Diagnosis present

## 2016-12-17 DIAGNOSIS — I6529 Occlusion and stenosis of unspecified carotid artery: Secondary | ICD-10-CM | POA: Diagnosis present

## 2016-12-17 DIAGNOSIS — R0789 Other chest pain: Secondary | ICD-10-CM | POA: Diagnosis present

## 2016-12-17 DIAGNOSIS — Z7982 Long term (current) use of aspirin: Secondary | ICD-10-CM | POA: Diagnosis not present

## 2016-12-17 DIAGNOSIS — K639 Disease of intestine, unspecified: Secondary | ICD-10-CM | POA: Diagnosis present

## 2016-12-17 DIAGNOSIS — I248 Other forms of acute ischemic heart disease: Secondary | ICD-10-CM | POA: Diagnosis present

## 2016-12-17 DIAGNOSIS — Z7902 Long term (current) use of antithrombotics/antiplatelets: Secondary | ICD-10-CM | POA: Diagnosis not present

## 2016-12-17 DIAGNOSIS — D631 Anemia in chronic kidney disease: Secondary | ICD-10-CM | POA: Diagnosis present

## 2016-12-17 DIAGNOSIS — R079 Chest pain, unspecified: Secondary | ICD-10-CM

## 2016-12-17 DIAGNOSIS — D500 Iron deficiency anemia secondary to blood loss (chronic): Secondary | ICD-10-CM | POA: Diagnosis not present

## 2016-12-17 DIAGNOSIS — K5730 Diverticulosis of large intestine without perforation or abscess without bleeding: Secondary | ICD-10-CM | POA: Diagnosis not present

## 2016-12-17 DIAGNOSIS — Z79899 Other long term (current) drug therapy: Secondary | ICD-10-CM | POA: Diagnosis not present

## 2016-12-17 DIAGNOSIS — Z8249 Family history of ischemic heart disease and other diseases of the circulatory system: Secondary | ICD-10-CM | POA: Diagnosis not present

## 2016-12-17 DIAGNOSIS — D649 Anemia, unspecified: Secondary | ICD-10-CM | POA: Diagnosis present

## 2016-12-17 DIAGNOSIS — D122 Benign neoplasm of ascending colon: Secondary | ICD-10-CM | POA: Diagnosis present

## 2016-12-17 DIAGNOSIS — N2581 Secondary hyperparathyroidism of renal origin: Secondary | ICD-10-CM | POA: Diagnosis present

## 2016-12-17 DIAGNOSIS — N189 Chronic kidney disease, unspecified: Secondary | ICD-10-CM

## 2016-12-17 LAB — BASIC METABOLIC PANEL
Anion gap: 11 (ref 5–15)
BUN: 17 mg/dL (ref 6–20)
CALCIUM: 8.2 mg/dL — AB (ref 8.9–10.3)
CHLORIDE: 98 mmol/L — AB (ref 101–111)
CO2: 29 mmol/L (ref 22–32)
CREATININE: 4.4 mg/dL — AB (ref 0.44–1.00)
GFR calc Af Amer: 11 mL/min — ABNORMAL LOW (ref >60)
GFR calc non Af Amer: 9 mL/min — ABNORMAL LOW (ref >60)
GLUCOSE: 82 mg/dL (ref 65–99)
Potassium: 3.1 mmol/L — ABNORMAL LOW (ref 3.5–5.1)
Sodium: 138 mmol/L (ref 135–145)

## 2016-12-17 LAB — TROPONIN I
TROPONIN I: 0.04 ng/mL — AB (ref <0.03)
TROPONIN I: 0.03 ng/mL — AB (ref <0.03)
Troponin I: 0.04 ng/mL (ref <0.03)
Troponin I: 0.03 ng/mL (ref <0.03)

## 2016-12-17 LAB — CBC WITH DIFFERENTIAL/PLATELET
BASOS ABS: 0.1 10*3/uL (ref 0–0.1)
Basophils Relative: 1 %
Eosinophils Absolute: 0.1 10*3/uL (ref 0–0.7)
Eosinophils Relative: 1 %
HEMATOCRIT: 19.3 % — AB (ref 35.0–47.0)
Hemoglobin: 6.4 g/dL — ABNORMAL LOW (ref 12.0–16.0)
LYMPHS ABS: 0.7 10*3/uL — AB (ref 1.0–3.6)
LYMPHS PCT: 9 %
MCH: 31.2 pg (ref 26.0–34.0)
MCHC: 33.1 g/dL (ref 32.0–36.0)
MCV: 94.3 fL (ref 80.0–100.0)
MONO ABS: 0.3 10*3/uL (ref 0.2–0.9)
MONOS PCT: 4 %
NEUTROS ABS: 6.2 10*3/uL (ref 1.4–6.5)
Neutrophils Relative %: 85 %
Platelets: 203 10*3/uL (ref 150–440)
RBC: 2.04 MIL/uL — ABNORMAL LOW (ref 3.80–5.20)
RDW: 18.1 % — AB (ref 11.5–14.5)
WBC: 7.3 10*3/uL (ref 3.6–11.0)

## 2016-12-17 LAB — HEPATIC FUNCTION PANEL
ALBUMIN: 3.2 g/dL — AB (ref 3.5–5.0)
ALK PHOS: 568 U/L — AB (ref 38–126)
ALT: 9 U/L — ABNORMAL LOW (ref 14–54)
AST: 16 U/L (ref 15–41)
BILIRUBIN TOTAL: 0.5 mg/dL (ref 0.3–1.2)
Total Protein: 6.3 g/dL — ABNORMAL LOW (ref 6.5–8.1)

## 2016-12-17 LAB — BRAIN NATRIURETIC PEPTIDE: B Natriuretic Peptide: 1468 pg/mL — ABNORMAL HIGH (ref 0.0–100.0)

## 2016-12-17 LAB — PROTIME-INR
INR: 1.25
Prothrombin Time: 15.8 seconds — ABNORMAL HIGH (ref 11.4–15.2)

## 2016-12-17 LAB — PREPARE RBC (CROSSMATCH)

## 2016-12-17 LAB — HEMOGLOBIN AND HEMATOCRIT, BLOOD
HCT: 23 % — ABNORMAL LOW (ref 35.0–47.0)
HEMOGLOBIN: 7.4 g/dL — AB (ref 12.0–16.0)

## 2016-12-17 MED ORDER — IOPAMIDOL (ISOVUE-300) INJECTION 61%
15.0000 mL | INTRAVENOUS | Status: AC
  Administered 2016-12-17 (×2): 15 mL via ORAL

## 2016-12-17 MED ORDER — CARVEDILOL 6.25 MG PO TABS
6.2500 mg | ORAL_TABLET | Freq: Two times a day (BID) | ORAL | Status: DC
Start: 2016-12-17 — End: 2016-12-20
  Administered 2016-12-17 – 2016-12-19 (×4): 6.25 mg via ORAL
  Filled 2016-12-17 (×4): qty 1

## 2016-12-17 MED ORDER — PEG 3350-KCL-NA BICARB-NACL 420 G PO SOLR
4000.0000 mL | Freq: Once | ORAL | Status: AC
  Administered 2016-12-18: 4000 mL via ORAL
  Filled 2016-12-17 (×2): qty 4000

## 2016-12-17 MED ORDER — PANTOPRAZOLE SODIUM 40 MG IV SOLR
40.0000 mg | Freq: Two times a day (BID) | INTRAVENOUS | Status: DC
  Administered 2016-12-17 – 2016-12-19 (×5): 40 mg via INTRAVENOUS
  Filled 2016-12-17 (×5): qty 40

## 2016-12-17 MED ORDER — LOSARTAN POTASSIUM 50 MG PO TABS
100.0000 mg | ORAL_TABLET | Freq: Every day | ORAL | Status: DC
  Administered 2016-12-17 – 2016-12-19 (×3): 100 mg via ORAL
  Filled 2016-12-17 (×3): qty 2

## 2016-12-17 MED ORDER — TRAZODONE HCL 50 MG PO TABS
25.0000 mg | ORAL_TABLET | Freq: Every day | ORAL | Status: DC
  Administered 2016-12-17 – 2016-12-18 (×2): 25 mg via ORAL
  Filled 2016-12-17 (×2): qty 1

## 2016-12-17 MED ORDER — AMLODIPINE BESYLATE 10 MG PO TABS
10.0000 mg | ORAL_TABLET | Freq: Every day | ORAL | Status: DC
  Administered 2016-12-17 – 2016-12-19 (×3): 10 mg via ORAL
  Filled 2016-12-17 (×3): qty 1

## 2016-12-17 MED ORDER — SODIUM CHLORIDE 0.9% FLUSH
3.0000 mL | Freq: Two times a day (BID) | INTRAVENOUS | Status: DC
  Administered 2016-12-17 – 2016-12-19 (×5): 3 mL via INTRAVENOUS

## 2016-12-17 MED ORDER — ONDANSETRON HCL 4 MG/2ML IJ SOLN: 4.0000 mg | Freq: Four times a day (QID) | INTRAMUSCULAR | Status: DC | PRN

## 2016-12-17 MED ORDER — IOPAMIDOL (ISOVUE-300) INJECTION 61%: 15.0000 mL | INTRAVENOUS | Status: DC

## 2016-12-17 MED ORDER — SPIRONOLACTONE 25 MG PO TABS
25.0000 mg | ORAL_TABLET | Freq: Every day | ORAL | Status: DC
  Administered 2016-12-17 – 2016-12-19 (×3): 25 mg via ORAL
  Filled 2016-12-17 (×3): qty 1

## 2016-12-17 MED ORDER — ATORVASTATIN CALCIUM 20 MG PO TABS
40.0000 mg | ORAL_TABLET | Freq: Every day | ORAL | Status: DC
  Administered 2016-12-17 – 2016-12-19 (×3): 40 mg via ORAL
  Filled 2016-12-17 (×3): qty 2

## 2016-12-17 MED ORDER — CALCIUM ACETATE (PHOS BINDER) 667 MG PO CAPS
1334.0000 mg | ORAL_CAPSULE | Freq: Three times a day (TID) | ORAL | Status: DC
  Administered 2016-12-17: 1334 mg via ORAL
  Filled 2016-12-17: qty 2

## 2016-12-17 MED ORDER — ONDANSETRON HCL 4 MG PO TABS: 4.0000 mg | ORAL_TABLET | Freq: Four times a day (QID) | ORAL | Status: DC | PRN

## 2016-12-17 MED ORDER — SODIUM CHLORIDE 0.9 % IV SOLN: INTRAVENOUS | Status: DC

## 2016-12-17 MED ORDER — POTASSIUM CHLORIDE CRYS ER 20 MEQ PO TBCR
40.0000 meq | EXTENDED_RELEASE_TABLET | Freq: Once | ORAL | Status: AC
  Administered 2016-12-17: 40 meq via ORAL
  Filled 2016-12-17: qty 2

## 2016-12-17 MED ORDER — SODIUM CHLORIDE 0.9 % IV SOLN
80.0000 mg | Freq: Once | INTRAVENOUS | Status: AC
  Administered 2016-12-17: 06:00:00 80 mg via INTRAVENOUS
  Filled 2016-12-17: qty 80

## 2016-12-17 MED ORDER — GABAPENTIN 300 MG PO CAPS
300.0000 mg | ORAL_CAPSULE | Freq: Every day | ORAL | Status: DC
  Administered 2016-12-17 – 2016-12-18 (×2): 300 mg via ORAL
  Filled 2016-12-17 (×2): qty 1

## 2016-12-17 MED ORDER — SODIUM CHLORIDE 0.9 % IV SOLN: 250.0000 mL | INTRAVENOUS | Status: DC | PRN

## 2016-12-17 MED ORDER — ACETAMINOPHEN 650 MG RE SUPP: 650.0000 mg | Freq: Four times a day (QID) | RECTAL | Status: DC | PRN

## 2016-12-17 MED ORDER — ACETAMINOPHEN 325 MG PO TABS
650.0000 mg | ORAL_TABLET | Freq: Four times a day (QID) | ORAL | Status: DC | PRN
  Administered 2016-12-17: 650 mg via ORAL
  Filled 2016-12-17: qty 2

## 2016-12-17 MED ORDER — SODIUM CHLORIDE 0.9% FLUSH: 3.0000 mL | INTRAVENOUS | Status: DC | PRN

## 2016-12-17 MED ORDER — SODIUM CHLORIDE 0.9 % IV SOLN: Freq: Once | INTRAVENOUS | Status: DC

## 2016-12-17 NOTE — Progress Notes (Signed)
Hemoglobin improved after 1 unit prBC's,seen by Gastroenterologist,schedule for colonoscopy on monday

## 2016-12-17 NOTE — H&P (Signed)
[facility] [place] NAME: Toni Carlson    MR#:  [id number]  DATE OF BIRTH:  1945/10/18  DATE OF ADMISSION:  12/17/2016  PRIMARY CARE PHYSICIAN: Murlean Iba, MD   REQUESTING/REFERRING PHYSICIAN:   CHIEF COMPLAINT:   Chief Complaint  Patient presents with  . Chest Pain    HISTORY OF PRESENT ILLNESS: Toni Carlson  is a 71 y.o. female with a known history of Atrial fibrillation, carotid artery stenosis, chronic diastolic congestive heart failure, coronary artery disease, end-stage renal disease on dialysis, hyperparathyroidism, hypertension, pulmonary hypertension presented to the emergency room with chest discomfort. Patient has chest pain which is left-sided 4 out of 10 on a scale of 1-10 complaints of any shortness of breath. Upon further questioning patient said she had some dark brown stool. Her stool blood was positive in the emergency room. Hemoglobin was 6.4. Patient had endoscopy in the past but was advised colonoscopy which she did not get it done. Currently she is on aspirin and Plavix as an outpatient. PRBC transfusion was ordered. Patient gets dialyzed on Monday witnessed and Friday. Hospitalist service was consulted.  PAST MEDICAL HISTORY:   Past Medical History:  Diagnosis Date  . A-fib (HCC)    a. on warfarin  . Anemia   . Carotid artery stenosis    a. ultrasound 03/2015: RICA 93-71% stenosis, LICA less than 69% stenosis  . Chronic diastolic CHF (congestive heart failure) ([place])    a. echo 03/2015: EF 60-65%, normal wall motion, diastolic parameters were c/w restrictive physiology, indicative of decreased LV diastolic compliance and/or increased LA pressure, mild AS, mild MR, left atrium was severely dilated, RA was severely dilated, PASP was severely increased at 90 mm Hg  . Coronary artery disease, non-occlusive    a. cardiac cath 2013  . ESRD (end stage renal disease) ([place])    On Tue-Thur-Sat dialysis  . H/O ischemic right  MCA stroke    a. 03/2015; b. residual left-sided facial droop and slurred speech  . Hyperparathyroidism, secondary renal ([place])   . Hypertension   . Nicotine dependence   . Pulmonary HTN ([place])    as above  . Symptomatic bradycardia    a. history of symptomatic bradycardia; b. felt to be 2/2 metabolic abnormalities & required temp wire but no PPM, resolved with HD    PAST SURGICAL HISTORY: Past Surgical History:  Procedure Laterality Date  . A/V FISTULAGRAM Left 10/25/2016   Procedure: A/V Fistulagram;  Surgeon: Katha Cabal, MD;  Location: [facility];  Service: Cardiovascular;  Laterality: Left;  . ESOPHAGOGASTRODUODENOSCOPY (EGD) WITH PROPOFOL N/A 04/14/2015   Procedure: ESOPHAGOGASTRODUODENOSCOPY (EGD) WITH PROPOFOL;  Surgeon: Lucilla Lame, MD;  Location: [facility] ENDOSCOPY;  Service: Endoscopy;  Laterality: N/A;  . ESOPHAGOGASTRODUODENOSCOPY (EGD) WITH PROPOFOL N/A 07/17/2015   Procedure: ESOPHAGOGASTRODUODENOSCOPY (EGD) WITH PROPOFOL;  Surgeon: Josefine Class, MD;  Location: [facility] ENDOSCOPY;  Service: Endoscopy;  Laterality: N/A;  . EUS N/A 09/24/2015   Procedure: UPPER ENDOSCOPIC ULTRASOUND (EUS) LINEAR;  Surgeon: Jola Schmidt, MD;  Location: [facility] ENDOSCOPY;  Service: Endoscopy;  Laterality: N/A;  . LOWER EXTREMITY ANGIOGRAPHY Right 06/09/2016   Procedure: Lower Extremity Angiography;  Surgeon: Algernon Huxley, MD;  Location: [facility];  Service: Cardiovascular;  Laterality: Right;  . PERIPHERAL VASCULAR CATHETERIZATION N/A 02/23/2015   Procedure: A/V Shuntogram/Fistulagram;  Surgeon: Algernon Huxley, MD;  Location: [facility];  Service: Cardiovascular;  Laterality: N/A;  . PERIPHERAL VASCULAR CATHETERIZATION  N/A 02/23/2015   Procedure: A/V Shunt Intervention;  Surgeon: Algernon Huxley, MD;  Location: [facility];  Service: Cardiovascular;  Laterality: N/A;    SOCIAL HISTORY:  Social History  Substance Use Topics  . Smoking status: Former Smoker     Packs/day: 0.25    Years: 30.00    Types: Cigarettes    Quit date: 11/20/2013  . Smokeless tobacco: Never Used  . Alcohol use No    FAMILY HISTORY:  Family History  Problem Relation Age of Onset  . CAD Father   . Dementia Mother   . Lupus Sister     DRUG ALLERGIES: No Known Allergies  REVIEW OF SYSTEMS:   CONSTITUTIONAL: No fever, fatigue or weakness.  EYES: No blurred or double vision.  EARS, NOSE, AND THROAT: No tinnitus or ear pain.  RESPIRATORY: No cough, shortness of breath, wheezing or hemoptysis.  CARDIOVASCULAR: Has chest pain,  No orthopnea, edema.  GASTROINTESTINAL: No nausea, vomiting, diarrhea or abdominal pain.  Has dark brown stool. GENITOURINARY: No dysuria, hematuria.  ENDOCRINE: No polyuria, nocturia,  HEMATOLOGY: No anemia, easy bruising or bleeding SKIN: No rash or lesion. MUSCULOSKELETAL: No joint pain or arthritis.   NEUROLOGIC: No tingling, numbness, weakness.  PSYCHIATRY: No anxiety or depression.   MEDICATIONS AT HOME:  Prior to Admission medications   Medication Sig Start Date End Date Taking? Authorizing Provider  amLODipine (NORVASC) 10 MG tablet Take 1 tablet (10 mg total) by mouth daily. 09/19/15   Carrie Mew, MD  aspirin EC 81 MG EC tablet Take 1 tablet (81 mg total) by mouth daily. 06/10/16   Algernon Huxley, MD  atorvastatin (LIPITOR) 40 MG tablet Take 1 tablet (40 mg total) by mouth daily. 09/19/15   Carrie Mew, MD  calcium acetate (PHOSLO) 667 MG capsule Take 1,334 mg by mouth 3 (three) times daily with meals.    [provider]  carvedilol (COREG) 6.25 MG tablet Take 1 tablet (6.25 mg total) by mouth 2 (two) times daily with a meal. Patient taking differently: Take 3.125 mg by mouth at bedtime.  09/19/15   Carrie Mew, MD  cetirizine (ZYRTEC) 10 MG tablet Take 10 mg by mouth daily.    [provider]  clopidogrel (PLAVIX) 75 MG tablet Take 1 tablet (75 mg total) by mouth daily. 06/10/16   Algernon Huxley, MD   gabapentin (NEURONTIN) 300 MG capsule Take 300 mg by mouth at bedtime.    [provider]  loperamide (IMODIUM) 2 MG capsule Take 2 mg by mouth as needed for diarrhea or loose stools.    [provider]  losartan (COZAAR) 100 MG tablet Take 1 tablet (100 mg total) by mouth daily. 09/19/15   Carrie Mew, MD  ondansetron (ZOFRAN) 4 MG tablet Take 4 mg by mouth every 8 (eight) hours as needed for nausea or vomiting.    [provider]  pantoprazole (PROTONIX) 40 MG tablet Take 1 tablet (40 mg total) by mouth 2 (two) times daily. Patient taking differently: Take 40 mg by mouth daily.  09/19/15   Carrie Mew, MD  polyethylene glycol [facility] / Floria Raveling) packet Take 17 g by mouth daily as needed for mild constipation. 09/19/15   Carrie Mew, MD  traZODone (DESYREL) 50 MG tablet Take 25 mg by mouth at bedtime.  05/18/16   [provider]  trolamine salicylate (ASPERCREME) 10 % cream Apply topically 2 (two) times daily as needed for muscle pain. 09/19/15   Carrie Mew, MD  PHYSICAL EXAMINATION:   VITAL SIGNS: Blood pressure (!) 130/58, pulse 60, temperature 97.9 F (36.6 C), temperature source Oral, height 5\' 6"  (1.676 m), weight 68 kg (150 lb), SpO2 96 %.  GENERAL:  71 y.o.-year-old patient lying in the bed with no acute distress.  EYES: Pupils equal, round, reactive to light and accommodation. No scleral icterus, pallor present. Extraocular muscles intact.  HEENT: Head atraumatic, normocephalic. Oropharynx and nasopharynx clear.  NECK:  Supple, no jugular venous distention. No thyroid enlargement, no tenderness.  LUNGS: Normal breath sounds bilaterally, no wheezing, rales,rhonchi or crepitation. No use of accessory muscles of respiration.  CARDIOVASCULAR: S1, S2 normal. No murmurs, rubs, or gallops.  ABDOMEN: Soft, nontender, nondistended. Bowel sounds present. No organomegaly or mass.  EXTREMITIES: No pedal edema, cyanosis, or clubbing.   NEUROLOGIC: Cranial nerves II through XII are intact. Muscle strength 5/5 in all extremities. Sensation intact. Gait not checked.  PSYCHIATRIC: The patient is alert and oriented x 3.  SKIN: No obvious rash, lesion, or ulcer.   LABORATORY PANEL:   CBC  Recent Labs Lab 12/17/16 0356  WBC 7.3  HGB 6.4*  HCT 19.3*  PLT 203  MCV 94.3  MCH 31.2  MCHC 33.1  RDW 18.1*  LYMPHSABS 0.7*  MONOABS 0.3  EOSABS 0.1  BASOSABS 0.1   ------------------------------------------------------------------------------------------------------------------  Chemistries   Recent Labs Lab 12/17/16 0356  NA 138  K 3.1*  CL 98*  CO2 29  GLUCOSE 82  BUN 17  CREATININE 4.40*  CALCIUM 8.2*  AST 16  ALT 9*  ALKPHOS 568*  BILITOT 0.5   ------------------------------------------------------------------------------------------------------------------ estimated creatinine clearance is 11.5 mL/min (A) (by C-G formula based on SCr of 4.4 mg/dL (H)). ------------------------------------------------------------------------------------------------------------------ No results for input(s): TSH, T4TOTAL, T3FREE, THYROIDAB in the last 72 hours.  Invalid input(s): FREET3   Coagulation profile  Recent Labs Lab 12/17/16 0356  INR 1.25   ------------------------------------------------------------------------------------------------------------------- No results for input(s): DDIMER in the last 72 hours. -------------------------------------------------------------------------------------------------------------------  Cardiac Enzymes  Recent Labs Lab 12/17/16 0356  TROPONINI 0.04*   ------------------------------------------------------------------------------------------------------------------ Invalid input(s): POCBNP  ---------------------------------------------------------------------------------------------------------------  Urinalysis    Component Value Date/Time   COLORURINE  YELLOW (A) 09/19/2015 1105   APPEARANCEUR CLEAR (A) 09/19/2015 1105   LABSPEC 1.011 09/19/2015 1105   PHURINE 8.0 09/19/2015 1105   GLUCOSEU >500 (A) 09/19/2015 1105   HGBUR NEGATIVE 09/19/2015 1105   BILIRUBINUR NEGATIVE 09/19/2015 1105   KETONESUR NEGATIVE 09/19/2015 1105   PROTEINUR >500 (A) 09/19/2015 1105   NITRITE NEGATIVE 09/19/2015 1105   LEUKOCYTESUR NEGATIVE 09/19/2015 1105     RADIOLOGY: Dg Chest Port 1 View  Result Date: 12/17/2016 CLINICAL DATA:  Acute onset of central chest pain and rhonchi. Initial encounter. EXAM: PORTABLE CHEST 1 VIEW COMPARISON:  Chest radiograph performed 09/19/2015 FINDINGS: The lungs are well-aerated. Increased interstitial markings raise concern for pulmonary edema, though pneumonia could have a similar appearance. There is no evidence of pleural effusion or pneumothorax. The cardiomediastinal silhouette is enlarged. No acute osseous abnormalities are seen. A vascular stent is noted overlying the left axilla. IMPRESSION: Increased interstitial markings raise concern for pulmonary edema, though pneumonia could have a similar appearance. Underlying cardiomegaly noted. Electronically Signed   By: Garald Balding M.D.   On: 12/17/2016 04:54    EKG: Orders placed or performed during the hospital encounter of 12/17/16  . ED EKG  . ED EKG    IMPRESSION AND PLAN: 71 year old female patient with history of coronary artery disease, end-stage renal disease on dialysis, chronic diastolic heart  failure presented to the emergency room with chest discomfort and weakness. Admitting diagnosis 1. Symptomatic anemia 2. Chest pain 3. Hypokalemia 4. Coronary artery disease 5. End-stage renal disease on dialysis 6. Chronic diastolic heart failure Treatment plan Admit patient to telemetry Transfuse 1 unit PRBC IV Replace potassium Cycle troponin Check echocardiogram Cardiology consultation Nephrology consultation  All the records are reviewed and case  discussed with ED provider. Management plans discussed with the patient, family and they are in agreement.  CODE STATUS:FULL CODE Surrogate decision maker : daughter Code Status History    Date Active Date Inactive Code Status Order ID Comments User Context   11/16/2016  8:51 PM 11/17/2016  3:31 PM Full Code [id number]  Fritzi Mandes, MD Inpatient   06/09/2016  6:39 PM 06/10/2016  6:35 PM Full Code [id number]  Algernon Huxley, MD Inpatient   07/15/2015  1:10 PM 07/17/2015  4:30 PM Full Code [id number]  Hillary Bow, MD ED   07/02/2015  5:23 PM 07/08/2015  8:09 PM Full Code [id number]  Gladstone Lighter, MD Inpatient   04/12/2015  1:03 PM 04/15/2015  6:00 PM DNR [id number]  Bettey Costa, MD Inpatient   03/26/2015  4:05 PM 04/01/2015  8:24 PM Full Code [id number]  Gladstone Lighter, MD Inpatient   02/23/2015  9:39 AM 02/23/2015  1:41 PM Full Code [id number]  Algernon Huxley, MD Inpatient       TOTAL TIME TAKING CARE OF THIS PATIENT: 50 minutes.    Saundra Shelling M.D on 12/17/2016 at 5:44 AM  Between 7am to 6pm - Pager - [phone]  After 6pm go to [email or web address] - password [email or web address] [facility]  Office  [phone]  CC: Primary care physician; Murlean Iba, MD

## 2016-12-17 NOTE — ED Notes (Signed)
Date and time results received: 12/17/16 0431   Test: troponin  Critical Value: 0.04  Name of Provider Notified: Dr. Mable Paris

## 2016-12-17 NOTE — Progress Notes (Signed)
[facility] at [place] NAME: Noami Bove    MR#:  [id number]  DATE OF BIRTH:  Oct 14, 1945  SUBJECTIVE:  Came in with chest pain at the facility. She was chest pain-free Found to have hemoglobin of 6.4 and heme positive stools.  REVIEW OF SYSTEMS:   ROS Tolerating Diet: Tolerating PT:   DRUG ALLERGIES:  No Known Allergies  VITALS:  Blood pressure (!) 117/47, pulse (!) 51, temperature 98.2 F (36.8 C), temperature source Oral, resp. rate 16, height 5\' 7"  (1.702 m), weight 69 kg (152 lb 3.2 oz), SpO2 96 %.  PHYSICAL EXAMINATION:   Physical Exam  GENERAL:  71 y.o.-year-old patient lying in the bed with no acute distress. Chronically ill EYES: Pupils equal, round, reactive to light and accommodation. No scleral icterus. Extraocular muscles intact. Anemic HEENT: Head atraumatic, normocephalic. Oropharynx and nasopharynx clear.  NECK:  Supple, no jugular venous distention. No thyroid enlargement, no tenderness.  LUNGS: Normal breath sounds bilaterally, no wheezing, rales, rhonchi. No use of accessory muscles of respiration.  CARDIOVASCULAR: S1, S2 normal. No murmurs, rubs, or gallops.  ABDOMEN: Soft, nontender, nondistended. Bowel sounds present. No organomegaly or mass.  EXTREMITIES: No cyanosis, clubbing or edema b/l.    NEUROLOGIC: Cranial nerves II through XII are intact. No focal Motor or sensory deficits b/l.   PSYCHIATRIC:  patient is alert and oriented x 3.  SKIN: No obvious rash, lesion, or ulcer.   LABORATORY PANEL:  CBC  Recent Labs Lab 12/17/16 0356  WBC 7.3  HGB 6.4*  HCT 19.3*  PLT 203    Chemistries   Recent Labs Lab 12/17/16 0356  NA 138  K 3.1*  CL 98*  CO2 29  GLUCOSE 82  BUN 17  CREATININE 4.40*  CALCIUM 8.2*  AST 16  ALT 9*  ALKPHOS 568*  BILITOT 0.5   Cardiac Enzymes  Recent Labs Lab 12/17/16 0943  TROPONINI 0.04*   RADIOLOGY:  Dg Chest Port 1 View  Result Date: 12/17/2016 CLINICAL  DATA:  Acute onset of central chest pain and rhonchi. Initial encounter. EXAM: PORTABLE CHEST 1 VIEW COMPARISON:  Chest radiograph performed 09/19/2015 FINDINGS: The lungs are well-aerated. Increased interstitial markings raise concern for pulmonary edema, though pneumonia could have a similar appearance. There is no evidence of pleural effusion or pneumothorax. The cardiomediastinal silhouette is enlarged. No acute osseous abnormalities are seen. A vascular stent is noted overlying the left axilla. IMPRESSION: Increased interstitial markings raise concern for pulmonary edema, though pneumonia could have a similar appearance. Underlying cardiomegaly noted. Electronically Signed   By: Garald Balding M.D.   On: 12/17/2016 04:54   ASSESSMENT AND PLAN:  Antoinette Borgwardt Mergenthaler is a 71 y.o. female with multiple medical problems including CHF, ESRD (on HD MWF) admitted for chest pain and found to have anemia with a Hgb 6.4. On Plavix and aspirin as an outpt. Denies black or red stools. Denies N/V/hematemesis. Denies abdominal pain. Heme positive in the ER.  1. Symptomatic anemia -Patient has history of anemia of chronic disease- -she has had EGD in 2017 which showed large polypoid mass that was negative for malignancy and normal duodenum and stomach. -Seen by GI. Patient will get colonoscopy on Monday. -Transfuse 1 unit today -Came in with hemoglobin of 6.4----one unit of blood transfusion----  2. Chest pain, mildly elevated troponin secondary to demand ischemia in the setting of anemia -Appears atypical. No EKG changes. -When necessary nitroglycerin  3. Hypokalemia -Replete  4.  Coronary artery disease -Continue cardiac meds  5. End-stage renal disease on dialysis =-Nephrology consultation for in-house dialysis  6. Chronic diastolic heart failure--- stable. Patient is not in heart failure  Case discussed with Care Management/Social Worker. Management plans discussed with the patient, family and they  are in agreement.  CODE STATUS: *Full*  DVT Prophylaxis: SCD  TOTAL TIME TAKING CARE OF THIS PATIENT: *25* minutes.  >50% time spent on counselling and coordination of care  POSSIBLE D/C IN *2-3* DAYS, DEPENDING ON CLINICAL CONDITION.  Note: This dictation was prepared with Dragon dictation along with smaller phrase technology. Any transcriptional errors that result from this process are unintentional.  Kathreen Dileo M.D on 12/17/2016 at 12:23 PM  Between 7am to 6pm - Pager - [phone]  After 6pm go to [email or web address] - password [email or web address] [facility]  Office  [phone]  CC: Primary care physician; Murlean Iba, MD

## 2016-12-17 NOTE — ED Notes (Signed)
Pt transport to 257 with this tech and Sunny Schlein, EDT

## 2016-12-17 NOTE — Consult Note (Addendum)
Referring Provider: Dr. Posey Pronto Primary Care Physician:  Murlean Iba, MD Primary Gastroenterologist:  Riccardo Dubin  Reason for Consultation:  Anemia   HPI: Toni Carlson is a 71 y.o. female with multiple medical problems including CHF, ESRD (on HD MWF) admitted for chest pain and found to have anemia with a Hgb 6.4. On Plavix and aspirin as an outpt. Denies black or red stools. Denies N/V/hematemesis. Denies abdominal pain. Heme positive in the ER. EGD in 2017 by Dr. Rayann Heman showing a large hyperplastic polypoid lesion. EUS subsequently done with biopsies negative for malignancy. She never followed up to get a colonoscopy. Troponin negative. BNP elevated. Feels ok and wants to eat.  Past Medical History:  Diagnosis Date  . A-fib (HCC)    a. on warfarin  . Anemia   . Carotid artery stenosis    a. ultrasound 03/2015: RICA 86-57% stenosis, LICA less than 84% stenosis  . Chronic diastolic CHF (congestive heart failure) ([place])    a. echo 03/2015: EF 60-65%, normal wall motion, diastolic parameters were c/w restrictive physiology, indicative of decreased LV diastolic compliance and/or increased LA pressure, mild AS, mild MR, left atrium was severely dilated, RA was severely dilated, PASP was severely increased at 90 mm Hg  . Coronary artery disease, non-occlusive    a. cardiac cath 2013  . ESRD (end stage renal disease) ([place])    On Tue-Thur-Sat dialysis  . H/O ischemic right MCA stroke    a. 03/2015; b. residual left-sided facial droop and slurred speech  . Hyperparathyroidism, secondary renal ([place])   . Hypertension   . Nicotine dependence   . Pulmonary HTN ([place])    as above  . Symptomatic bradycardia    a. history of symptomatic bradycardia; b. felt to be 2/2 metabolic abnormalities & required temp wire but no PPM, resolved with HD    Past Surgical History:  Procedure Laterality Date  . A/V FISTULAGRAM Left 10/25/2016   Procedure: A/V Fistulagram;  Surgeon: Katha Cabal, MD;   Location: [place] [facility];  Service: Cardiovascular;  Laterality: Left;  . ESOPHAGOGASTRODUODENOSCOPY (EGD) WITH PROPOFOL N/A 04/14/2015   Procedure: ESOPHAGOGASTRODUODENOSCOPY (EGD) WITH PROPOFOL;  Surgeon: Lucilla Lame, MD;  Location: [facility] ENDOSCOPY;  Service: Endoscopy;  Laterality: N/A;  . ESOPHAGOGASTRODUODENOSCOPY (EGD) WITH PROPOFOL N/A 07/17/2015   Procedure: ESOPHAGOGASTRODUODENOSCOPY (EGD) WITH PROPOFOL;  Surgeon: Josefine Class, MD;  Location: [facility] ENDOSCOPY;  Service: Endoscopy;  Laterality: N/A;  . EUS N/A 09/24/2015   Procedure: UPPER ENDOSCOPIC ULTRASOUND (EUS) LINEAR;  Surgeon: Jola Schmidt, MD;  Location: [facility] ENDOSCOPY;  Service: Endoscopy;  Laterality: N/A;  . LOWER EXTREMITY ANGIOGRAPHY Right 06/09/2016   Procedure: Lower Extremity Angiography;  Surgeon: Algernon Huxley, MD;  Location: [place] [facility];  Service: Cardiovascular;  Laterality: Right;  . PERIPHERAL VASCULAR CATHETERIZATION N/A 02/23/2015   Procedure: A/V Shuntogram/Fistulagram;  Surgeon: Algernon Huxley, MD;  Location: [place] [facility];  Service: Cardiovascular;  Laterality: N/A;  . PERIPHERAL VASCULAR CATHETERIZATION N/A 02/23/2015   Procedure: A/V Shunt Intervention;  Surgeon: Algernon Huxley, MD;  Location: [place] [facility];  Service: Cardiovascular;  Laterality: N/A;    Prior to Admission medications   Medication Sig Start Date End Date Taking? Authorizing Provider  amLODipine (NORVASC) 10 MG tablet Take 1 tablet (10 mg total) by mouth daily. 09/19/15   Carrie Mew, MD  aspirin EC 81 MG EC tablet Take 1 tablet (81 mg total) by mouth daily. 06/10/16   Algernon Huxley, MD  atorvastatin (LIPITOR)  40 MG tablet Take 1 tablet (40 mg total) by mouth daily. 09/19/15   Carrie Mew, MD  calcium acetate (PHOSLO) 667 MG capsule Take 1,334 mg by mouth 3 (three) times daily with meals.    [provider]  carvedilol (COREG) 6.25 MG tablet Take 1 tablet (6.25 mg total) by mouth 2 (two) times daily with a  meal. Patient taking differently: Take 3.125 mg by mouth at bedtime.  09/19/15   Carrie Mew, MD  cetirizine (ZYRTEC) 10 MG tablet Take 10 mg by mouth daily.    [provider]  clopidogrel (PLAVIX) 75 MG tablet Take 1 tablet (75 mg total) by mouth daily. 06/10/16   Algernon Huxley, MD  gabapentin (NEURONTIN) 300 MG capsule Take 300 mg by mouth at bedtime.    [provider]  loperamide (IMODIUM) 2 MG capsule Take 2 mg by mouth as needed for diarrhea or loose stools.    [provider]  losartan (COZAAR) 100 MG tablet Take 1 tablet (100 mg total) by mouth daily. 09/19/15   Carrie Mew, MD  ondansetron (ZOFRAN) 4 MG tablet Take 4 mg by mouth every 8 (eight) hours as needed for nausea or vomiting.    [provider]  pantoprazole (PROTONIX) 40 MG tablet Take 1 tablet (40 mg total) by mouth 2 (two) times daily. Patient taking differently: Take 40 mg by mouth daily.  09/19/15   Carrie Mew, MD  polyethylene glycol [facility] Floria Raveling) packet Take 17 g by mouth daily as needed for mild constipation. 09/19/15   Carrie Mew, MD  traZODone (DESYREL) 50 MG tablet Take 25 mg by mouth at bedtime.  05/18/16   [provider]  trolamine salicylate (ASPERCREME) 10 % cream Apply topically 2 (two) times daily as needed for muscle pain. 09/19/15   Carrie Mew, MD    Scheduled Meds: . amLODipine  10 mg Oral Daily  . atorvastatin  40 mg Oral Daily  . calcium acetate  1,334 mg Oral TID WC  . carvedilol  6.25 mg Oral BID WC  . gabapentin  300 mg Oral QHS  . losartan  100 mg Oral Daily  . pantoprazole (PROTONIX) IV  40 mg Intravenous Q12H  . [START ON 12/18/2016] polyethylene glycol-electrolytes  4,000 mL Oral Once  . sodium chloride flush  3 mL Intravenous Q12H  . traZODone  25 mg Oral QHS   Continuous Infusions: . sodium chloride    . sodium chloride    . sodium chloride     PRN Meds:.sodium chloride, acetaminophen **OR** acetaminophen, ondansetron  **OR** ondansetron (ZOFRAN) IV, sodium chloride flush  Allergies as of 12/17/2016  . (No Known Allergies)    Family History  Problem Relation Age of Onset  . CAD Father   . Dementia Mother   . Lupus Sister     Social History   Social History  . Marital status: Divorced    Spouse name: N/A  . Number of children: N/A  . Years of education: N/A   Occupational History  . retired    Social History Main Topics  . Smoking status: Former Smoker    Packs/day: 0.25    Years: 30.00    Types: Cigarettes    Quit date: 11/20/2013  . Smokeless tobacco: Never Used  . Alcohol use No  . Drug use: No  . Sexual activity: Not on file   Other Topics Concern  . Not on file   Social History Narrative   Lives at home independently.  Has a cane for ambulation.    Review of Systems: All negative except as stated above in HPI.  Physical Exam: Vital signs: Vitals:   12/17/16 0530 12/17/16 0739  BP: (!) 127/44 (!) 123/47  Pulse: (!) 50 (!) 49  Resp: (!) 21 16  Temp: 97.7 F (36.5 C) 97.8 F (36.6 C)   Last BM Date: 12/16/16 General:   Lethargic, Well-developed, well-nourished, pleasant and cooperative in NAD HEENT: anicteric sclera, poor dentition Lungs:  Clear throughout to auscultation.   No wheezes, crackles, or rhonchi. No acute distress. Heart:  Regular rate and rhythm; no murmurs, clicks, rubs,  or gallops. Abdomen: diffuse tenderness with guarding, soft, nondistended, +BS  Rectal:  Deferred Ext: no edema  GI:  Lab Results:  Recent Labs  12/17/16 0356  WBC 7.3  HGB 6.4*  HCT 19.3*  PLT 203   BMET  Recent Labs  12/17/16 0356  NA 138  K 3.1*  CL 98*  CO2 29  GLUCOSE 82  BUN 17  CREATININE 4.40*  CALCIUM 8.2*   LFT  Recent Labs  12/17/16 0356  PROT 6.3*  ALBUMIN 3.2*  AST 16  ALT 9*  ALKPHOS 568*  BILITOT 0.5  BILIDIR <0.1*  IBILI NOT CALCULATED   PT/INR  Recent Labs  12/17/16 0356  LABPROT 15.8*  INR 1.25     Studies/Results: Dg  Chest Port 1 View  Result Date: 12/17/2016 CLINICAL DATA:  Acute onset of central chest pain and rhonchi. Initial encounter. EXAM: PORTABLE CHEST 1 VIEW COMPARISON:  Chest radiograph performed 09/19/2015 FINDINGS: The lungs are well-aerated. Increased interstitial markings raise concern for pulmonary edema, though pneumonia could have a similar appearance. There is no evidence of pleural effusion or pneumothorax. The cardiomediastinal silhouette is enlarged. No acute osseous abnormalities are seen. A vascular stent is noted overlying the left axilla. IMPRESSION: Increased interstitial markings raise concern for pulmonary edema, though pneumonia could have a similar appearance. Underlying cardiomegaly noted. Electronically Signed   By: Garald Balding M.D.   On: 12/17/2016 04:54    Impression/Plan: 71 yo with symptomatic anemia with chest pain and heme positive stool in the ER. Continue Protonix 40 mg IV Q 12 hours. Will do a noncontrast abd/pelvis CT due to diffuse abdominal pain. Would recommend an inpatient colonoscopy and updated EGD and will give a colon prep tomorrow and have procedures done on Monday 12/19/16 by Dr. Vicente Males. Full liquid diet ok today. Clear liquids tomorrow. Please arrange for Monday afternoon dialysis if needed since GI procedures will be Monday morning.     LOS: 0 days   [place] [facility].  12/17/2016, 10:41 AM

## 2016-12-17 NOTE — Progress Notes (Signed)
[place] received an OR to go to room 257A and pray with PT. [place] arrived at room and found PT awake and watching TV. Ch offered prayer and emotional support.    12/17/16 1920  Clinical Encounter Type  Visited With Patient  Visit Type Initial  Referral From Nurse  Consult/Referral To Chaplain  Spiritual Encounters  Spiritual Needs Prayer;Emotional

## 2016-12-17 NOTE — Progress Notes (Signed)
[facility], [place] 12/17/16  Subjective:   Patient known to our practice from previous admissions. She presented for chest pain and was found to have severe anemia with hemoglobin of 6.4. Her last dialysis treatment was yesterday.  Objective:  Vital signs in last 24 hours:  Temp:  [97.7 F (36.5 C)-98.2 F (36.8 C)] 98.2 F (36.8 C) (08/04 1133) Pulse Rate:  [49-60] 51 (08/04 1133) Resp:  [16-21] 18 (08/04 1133) BP: (122-130)/(44-58) 122/44 (08/04 1133) SpO2:  [96 %-100 %] 100 % (08/04 1133) Weight:  [68 kg (150 lb)-69 kg (152 lb 3.2 oz)] 69 kg (152 lb 3.2 oz) (08/04 0630)  Weight change:  Filed Weights   12/17/16 0358 12/17/16 0630  Weight: 68 kg (150 lb) 69 kg (152 lb 3.2 oz)    Intake/Output:   No intake or output data in the 24 hours ending 12/17/16 1141   Physical Exam: General: No acute distress, laying in the bed   HEENT Pale conjunctiva, moist oral mucous membranes   Neck Supple   Pulm/lungs Normal breathing effort, clear to auscultation   CVS/Heart Regular rate and rhythm   Abdomen:  No rub or gallop   Extremities: No edema   Neurologic: Alert, oriented   Skin: No acute rashes   Access: Left upper arm AVF       Basic Metabolic Panel:   Recent Labs Lab 12/17/16 0356  NA 138  K 3.1*  CL 98*  CO2 29  GLUCOSE 82  BUN 17  CREATININE 4.40*  CALCIUM 8.2*     CBC:  Recent Labs Lab 12/17/16 0356  WBC 7.3  NEUTROABS 6.2  HGB 6.4*  HCT 19.3*  MCV 94.3  PLT 203     Lab Results  Component Value Date   HEPBSAG Negative 06/10/2016      Microbiology:  No results found for this or any previous visit (from the past 240 hour(s)).  Coagulation Studies:  Recent Labs  12/17/16 0356  LABPROT 15.8*  INR 1.25    Urinalysis: No results for input(s): COLORURINE, LABSPEC, PHURINE, GLUCOSEU, HGBUR, BILIRUBINUR, KETONESUR, PROTEINUR, UROBILINOGEN, NITRITE, LEUKOCYTESUR in the last 72 hours.  Invalid input(s):  APPERANCEUR    Imaging: Dg Chest Port 1 View  Result Date: 12/17/2016 CLINICAL DATA:  Acute onset of central chest pain and rhonchi. Initial encounter. EXAM: PORTABLE CHEST 1 VIEW COMPARISON:  Chest radiograph performed 09/19/2015 FINDINGS: The lungs are well-aerated. Increased interstitial markings raise concern for pulmonary edema, though pneumonia could have a similar appearance. There is no evidence of pleural effusion or pneumothorax. The cardiomediastinal silhouette is enlarged. No acute osseous abnormalities are seen. A vascular stent is noted overlying the left axilla. IMPRESSION: Increased interstitial markings raise concern for pulmonary edema, though pneumonia could have a similar appearance. Underlying cardiomegaly noted. Electronically Signed   By: Garald Balding M.D.   On: 12/17/2016 04:54     Medications:   . sodium chloride    . sodium chloride    . sodium chloride     . amLODipine  10 mg Oral Daily  . atorvastatin  40 mg Oral Daily  . calcium acetate  1,334 mg Oral TID WC  . carvedilol  6.25 mg Oral BID WC  . gabapentin  300 mg Oral QHS  . losartan  100 mg Oral Daily  . pantoprazole (PROTONIX) IV  40 mg Intravenous Q12H  . [START ON 12/18/2016] polyethylene glycol-electrolytes  4,000 mL Oral Once  . sodium chloride flush  3 mL  Intravenous Q12H  . traZODone  25 mg Oral QHS   sodium chloride, acetaminophen **OR** acetaminophen, ondansetron **OR** ondansetron (ZOFRAN) IV, sodium chloride flush  Assessment/ Plan:  71 y.o. female with ESRD on hemodialysis, GERD, anemia, atrial fibrillation, CVA, coronary artery disease, pulmonary hypertension, carotid artery stenosis  8/4- presents with severe anemia, suspected GI bleed, chest pain  [facility] MWF [address]   1. End Stage Renal Disease: on hemodialysis. MWF schedule. With hypokalemia - Patient had her hemodialysis yesterday.  - resume MWF schedule. We will plan for Monday afternoon after colonoscopy  2. Anemia  of chronic kidney disease: with GI bleed. Hemoglobin 6.4 on admission.  Status post PRBC transfusion - Appreciate GI input - EPO with HD treatment  3. Hypokalemia - Start spironolactone   4. Secondary Hyperparathyroidism:  - Hold calcium acetate until after colonoscopy and when able to eat a normal diet   LOS: 0 [facility] 8/4/201811:41 AM  [facility] [place], [place]

## 2016-12-17 NOTE — Progress Notes (Signed)
Blood transfusion started and no reactions for the first 15 mins.Close monitoring in progress

## 2016-12-17 NOTE — ED Provider Notes (Signed)
[facility] Emergency Department Provider Note  ____________________________________________   First MD Initiated Contact with Patient 12/17/16 [phone]     (approximate)  I have reviewed the triage vital signs and the nursing notes.   HISTORY  Chief Complaint Chest Pain    HPI Toni Carlson is a 71 y.o. female who comes to the emergency department via EMS after having sudden onset substernal sharp stabbing chest pain that began about an hour prior to arrival. She notified the nurse at her facility and did not want the nurse to call 911. When EMS arrived they gave the patient 324 mg of aspirin and 2 sprays of nitroglycerin which seemed to help her symptoms. She has a Kretschmer-standing history of hypertension and congestive heart failure and end-stage renal disease and was dialyzed yesterday.   Past Medical History:  Diagnosis Date  . A-fib (HCC)    a. on warfarin  . Anemia   . Carotid artery stenosis    a. ultrasound 03/2015: RICA 38-88% stenosis, LICA less than 28% stenosis  . Chronic diastolic CHF (congestive heart failure) ([place])    a. echo 03/2015: EF 60-65%, normal wall motion, diastolic parameters were c/w restrictive physiology, indicative of decreased LV diastolic compliance and/or increased LA pressure, mild AS, mild MR, left atrium was severely dilated, RA was severely dilated, PASP was severely increased at 90 mm Hg  . Coronary artery disease, non-occlusive    a. cardiac cath 2013  . ESRD (end stage renal disease) ([place])    On Tue-Thur-Sat dialysis  . H/O ischemic right MCA stroke    a. 03/2015; b. residual left-sided facial droop and slurred speech  . Hyperparathyroidism, secondary renal ([place])   . Hypertension   . Nicotine dependence   . Pulmonary HTN ([place])    as above  . Symptomatic bradycardia    a. history of symptomatic bradycardia; b. felt to be 2/2 metabolic abnormalities & required temp wire but no PPM, resolved with HD    Patient  Active Problem List   Diagnosis Date Noted  . Complication from renal dialysis device 11/27/2016  . Atherosclerosis of artery of extremity with rest pain ([place]) 06/09/2016  . Atherosclerosis of native arteries of extremity with rest pain ([place]) 05/24/2016  . Gastric polyp 08/18/2015  . Anemia 07/15/2015  . GI bleed 07/15/2015  . Polyneuropathy 07/08/2015  . Degenerative disc disease, lumbar 07/08/2015  . Pancytopenia ([place]) 07/08/2015  . Generalized weakness 07/08/2015  . End stage renal disease ([place]) 07/08/2015  . Atherosclerotic peripheral vascular disease ([place]) 07/08/2015  . Pain in both feet 07/02/2015  . Gastric ulceration   . Acute GI bleeding   . GIB (gastrointestinal bleeding) 04/12/2015  . Left-sided weakness 04/01/2015  . Essential hypertension, malignant 04/01/2015  . Anemia of chronic disease 04/01/2015  . Elevated troponin 04/01/2015  . Pulmonary hypertension ([place]) 04/01/2015  . Atrial fibrillation ([place])   . CVA (cerebral infarction) 03/26/2015    Past Surgical History:  Procedure Laterality Date  . A/V FISTULAGRAM Left 10/25/2016   Procedure: A/V Fistulagram;  Surgeon: Katha Cabal, MD;  Location: [place] [facility];  Service: Cardiovascular;  Laterality: Left;  . ESOPHAGOGASTRODUODENOSCOPY (EGD) WITH PROPOFOL N/A 04/14/2015   Procedure: ESOPHAGOGASTRODUODENOSCOPY (EGD) WITH PROPOFOL;  Surgeon: Lucilla Lame, MD;  Location: [facility] ENDOSCOPY;  Service: Endoscopy;  Laterality: N/A;  . ESOPHAGOGASTRODUODENOSCOPY (EGD) WITH PROPOFOL N/A 07/17/2015   Procedure: ESOPHAGOGASTRODUODENOSCOPY (EGD) WITH PROPOFOL;  Surgeon: Josefine Class, MD;  Location: [facility] ENDOSCOPY;  Service: Endoscopy;  Laterality: N/A;  . EUS N/A 09/24/2015   Procedure: UPPER ENDOSCOPIC ULTRASOUND (EUS) LINEAR;  Surgeon: Jola Schmidt, MD;  Location: [facility] ENDOSCOPY;  Service: Endoscopy;  Laterality: N/A;  . LOWER EXTREMITY ANGIOGRAPHY Right 06/09/2016   Procedure: Lower Extremity Angiography;  Surgeon:  Algernon Huxley, MD;  Location: [place] [facility];  Service: Cardiovascular;  Laterality: Right;  . PERIPHERAL VASCULAR CATHETERIZATION N/A 02/23/2015   Procedure: A/V Shuntogram/Fistulagram;  Surgeon: Algernon Huxley, MD;  Location: [place] [facility];  Service: Cardiovascular;  Laterality: N/A;  . PERIPHERAL VASCULAR CATHETERIZATION N/A 02/23/2015   Procedure: A/V Shunt Intervention;  Surgeon: Algernon Huxley, MD;  Location: [place] [facility];  Service: Cardiovascular;  Laterality: N/A;    Prior to Admission medications   Medication Sig Start Date End Date Taking? Authorizing Provider  amLODipine (NORVASC) 10 MG tablet Take 1 tablet (10 mg total) by mouth daily. 09/19/15   Carrie Mew, MD  aspirin EC 81 MG EC tablet Take 1 tablet (81 mg total) by mouth daily. 06/10/16   Algernon Huxley, MD  atorvastatin (LIPITOR) 40 MG tablet Take 1 tablet (40 mg total) by mouth daily. 09/19/15   Carrie Mew, MD  calcium acetate (PHOSLO) 667 MG capsule Take 1,334 mg by mouth 3 (three) times daily with meals.    [provider]  carvedilol (COREG) 6.25 MG tablet Take 1 tablet (6.25 mg total) by mouth 2 (two) times daily with a meal. Patient taking differently: Take 3.125 mg by mouth at bedtime.  09/19/15   Carrie Mew, MD  cetirizine (ZYRTEC) 10 MG tablet Take 10 mg by mouth daily.    [provider]  clopidogrel (PLAVIX) 75 MG tablet Take 1 tablet (75 mg total) by mouth daily. 06/10/16   Algernon Huxley, MD  gabapentin (NEURONTIN) 300 MG capsule Take 300 mg by mouth at bedtime.    [provider]  loperamide (IMODIUM) 2 MG capsule Take 2 mg by mouth as needed for diarrhea or loose stools.    [provider]  losartan (COZAAR) 100 MG tablet Take 1 tablet (100 mg total) by mouth daily. 09/19/15   Carrie Mew, MD  ondansetron (ZOFRAN) 4 MG tablet Take 4 mg by mouth every 8 (eight) hours as needed for nausea or vomiting.    [provider]  pantoprazole (PROTONIX)  40 MG tablet Take 1 tablet (40 mg total) by mouth 2 (two) times daily. Patient taking differently: Take 40 mg by mouth daily.  09/19/15   Carrie Mew, MD  polyethylene glycol [facility] Floria Raveling) packet Take 17 g by mouth daily as needed for mild constipation. 09/19/15   Carrie Mew, MD  traZODone (DESYREL) 50 MG tablet Take 25 mg by mouth at bedtime.  05/18/16   [provider]  trolamine salicylate (ASPERCREME) 10 % cream Apply topically 2 (two) times daily as needed for muscle pain. 09/19/15   Carrie Mew, MD    Allergies Patient has no known allergies.  Family History  Problem Relation Age of Onset  . CAD Father   . Dementia Mother   . Lupus Sister     Social History Social History  Substance Use Topics  . Smoking status: Former Smoker    Packs/day: 0.25    Years: 30.00    Types: Cigarettes    Quit date: 11/20/2013  . Smokeless tobacco: Never Used  . Alcohol use No    Review of Systems Constitutional: No fever/chills Eyes: No visual changes. ENT: No sore  throat. Cardiovascular: Positive chest pain. Respiratory: Positive shortness of breath. Gastrointestinal: No abdominal pain.  No nausea, no vomiting.  No diarrhea.  No constipation. Genitourinary: Negative for dysuria. Musculoskeletal: Negative for back pain. Skin: Negative for rash. Neurological: Negative for headaches, focal weakness or numbness.   ____________________________________________   PHYSICAL EXAM:  VITAL SIGNS: ED Triage Vitals  Enc Vitals Group     BP      Pulse      Resp      Temp      Temp src      SpO2      Weight      Height      Head Circumference      Peak Flow      Pain Score      Pain Loc      Pain Edu?      Excl. in [place]?     Constitutional: Alert and oriented 4 curled on her side slightly short of breath but speaks in full sentences Eyes: PERRL EOMI. Head: Atraumatic. Nose: No congestion/rhinnorhea. Mouth/Throat: No trismus Neck: No stridor.     Cardiovascular: Normal rate, regular rhythm. Grossly normal heart sounds.  Good peripheral circulation. Respiratory: Slightly increased respiratory effort with wheezes in bilateral bases Gastrointestinal: Soft nontender Musculoskeletal: Shunt to left upper extremity with good thrill Neurologic:  Normal speech and language. No gross focal neurologic deficits are appreciated. Skin:  Skin is warm, dry and intact. No rash noted. Psychiatric: Mood and affect are normal. Speech and behavior are normal.    ____________________________________________   DIFFERENTIAL includes but not limited to  CHF, end-stage renal fluid overload, acute coronary syndrome, GI bleed, COPD, pneumothorax, pulmonary embolism ____________________________________________   LABS (all labs ordered are listed, but only abnormal results are displayed)  Labs Reviewed  BASIC METABOLIC PANEL - Abnormal; Notable for the following:       Result Value   Potassium 3.1 (*)    Chloride 98 (*)    Creatinine, Ser 4.40 (*)    Calcium 8.2 (*)    GFR calc non Af Amer 9 (*)    GFR calc Af Amer 11 (*)    All other components within normal limits  HEPATIC FUNCTION PANEL - Abnormal; Notable for the following:    Total Protein 6.3 (*)    Albumin 3.2 (*)    ALT 9 (*)    Alkaline Phosphatase 568 (*)    Bilirubin, Direct <0.1 (*)    All other components within normal limits  BRAIN NATRIURETIC PEPTIDE - Abnormal; Notable for the following:    B Natriuretic Peptide 1,468.0 (*)    All other components within normal limits  TROPONIN I - Abnormal; Notable for the following:    Troponin I 0.04 (*)    All other components within normal limits  CBC WITH DIFFERENTIAL/PLATELET - Abnormal; Notable for the following:    RBC 2.04 (*)    Hemoglobin 6.4 (*)    HCT 19.3 (*)    RDW 18.1 (*)    Lymphs Abs 0.7 (*)    All other components within normal limits  PROTIME-INR - Abnormal; Notable for the following:    Prothrombin Time 15.8 (*)     All other components within normal limits  TROPONIN I  TYPE AND SCREEN  ABO/RH  ANTIBODY SCREEN  PREPARE RBC (CROSSMATCH)    Elevated troponin but the setting of end-stage renal disease of unclear clinical significance __________________________________________  EKG  ED ECG REPORT I, Darel Hong,  the attending physician, personally viewed and interpreted this ECG.  Date: 12/17/2016  Rate: 55 Rhythm: Sinus bradycardia QRS Axis: normal Intervals: Prolonged QTC ST/T Wave abnormalities: High lateral and lateral T-wave inversion Narrative Interpretation: T wave inversions compared to previous EKG on July 4 and showed no change. EKG is abnormal but unchanged  ____________________________________________  RADIOLOGY  Chest x-ray suggestive of acute pulmonary edema   PROCEDURES  Procedure(s) performed: no  Procedures  Critical Care performed: no  Observation: no ____________________________________________   INITIAL IMPRESSION / ASSESSMENT AND PLAN / ED COURSE  Pertinent labs & imaging results that were available during my care of the patient were reviewed by me and considered in my medical decision making (see chart for details).  The patient arrives somewhat short of breath with wheezes in bilateral bases concerning for fluid overload. She does not require further nitrates or BiPAP at this time. Labs and chest x-ray are pending.     ----------------------------------------- 4:57 AM on 12/17/2016 -----------------------------------------  While the patient is well-appearing her hemoglobin came back at 6.4 and she is symptomatic. She has brown stool but guaiac positive and is on dual antiplatelet therapy. She last had an endoscopy in 2017 which was normal and this may represent a lower GI bleed. Given the significant drop in her hemoglobin she will require inpatient admission for blood transfusion and likely inpatient  dialysis. ____________________________________________   FINAL CLINICAL IMPRESSION(S) / ED DIAGNOSES  Final diagnoses:  Anemia in chronic kidney disease, unspecified CKD stage  Chest pain, unspecified type  Shortness of breath  ESRD (end stage renal disease) ([place])      NEW MEDICATIONS STARTED DURING THIS VISIT:  New Prescriptions   No medications on file     Note:  This document was prepared using Dragon voice recognition software and may include unintentional dictation errors.     Darel Hong, MD 12/17/16 [phone]

## 2016-12-17 NOTE — ED Triage Notes (Signed)
Per EMS: Patient coming from [facility]. Pt c/o medial chest pain that worsens with exertion for 1 hour.  Pt has rhonchi all fields.  Pt has hx CHF and dialysis patient. PT goes to dialysis M/W/F - fistula left arm. Pt received 324mg  asiprin and 1 SL nitro spray with some relief of chest pain.   Patient on 2L [place] at 98% oxygen saturation at EMS arrival. Pt not normally on oxygen per patient and facility.

## 2016-12-18 LAB — CBC
HCT: 22.6 % — ABNORMAL LOW (ref 35.0–47.0)
HEMOGLOBIN: 7.4 g/dL — AB (ref 12.0–16.0)
MCH: 30.2 pg (ref 26.0–34.0)
MCHC: 32.9 g/dL (ref 32.0–36.0)
MCV: 92.1 fL (ref 80.0–100.0)
Platelets: 174 10*3/uL (ref 150–440)
RBC: 2.45 MIL/uL — AB (ref 3.80–5.20)
RDW: 19.4 % — ABNORMAL HIGH (ref 11.5–14.5)
WBC: 5.9 10*3/uL (ref 3.6–11.0)

## 2016-12-18 LAB — PREPARE RBC (CROSSMATCH)

## 2016-12-18 MED ORDER — SODIUM CHLORIDE 0.9 % IV SOLN: Freq: Once | INTRAVENOUS | Status: DC

## 2016-12-18 NOTE — Progress Notes (Signed)
Spoke with dr. Posey Pronto to make aware patients heart rate is 40-50's. Per md okay to hold coreg

## 2016-12-18 NOTE — Progress Notes (Signed)
[facility], [place] 12/18/16  Subjective:   Patient known to our practice from previous admissions. She presented for chest pain and was found to have severe anemia with hemoglobin of 6.4. Her last dialysis treatment was Friday She has received blood transfusion. Currently taking prep for colonoscopy tomorrow. Denies SOB Reports she is hungry  Objective:  Vital signs in last 24 hours:  Temp:  [97.5 F (36.4 C)-98.6 F (37 C)] 97.5 F (36.4 C) (08/05 1000) Pulse Rate:  [44-52] 44 (08/05 1000) Resp:  [16-19] 18 (08/05 1000) BP: (113-140)/(42-54) 136/51 (08/05 1000) SpO2:  [93 %-100 %] 95 % (08/05 1000) Weight:  [69.9 kg (154 lb 1.6 oz)] 69.9 kg (154 lb 1.6 oz) (08/05 0500)  Weight change: 1.86 kg (4 lb 1.6 oz) Filed Weights   12/17/16 0358 12/17/16 0630 12/18/16 0500  Weight: 68 kg (150 lb) 69 kg (152 lb 3.2 oz) 69.9 kg (154 lb 1.6 oz)    Intake/Output:    Intake/Output Summary (Last 24 hours) at 12/18/16 1003 Last data filed at 12/18/16 3790  Gross per 24 hour  Intake              734 ml  Output              300 ml  Net              434 ml     Physical Exam: General: No acute distress, laying in the bed   HEENT Pale conjunctiva, moist oral mucous membranes   Neck Supple   Pulm/lungs Normal breathing effort, clear to auscultation , Honor O2  CVS/Heart Regular rate and rhythm   Abdomen:  No rub or gallop   Extremities: No edema   Neurologic: Alert, oriented   Skin: No acute rashes   Access: Left upper arm AVF       Basic Metabolic Panel:   Recent Labs Lab 12/17/16 0356  NA 138  K 3.1*  CL 98*  CO2 29  GLUCOSE 82  BUN 17  CREATININE 4.40*  CALCIUM 8.2*     CBC:  Recent Labs Lab 12/17/16 0356 12/17/16 1600 12/18/16 0544  WBC 7.3  --  5.9  NEUTROABS 6.2  --   --   HGB 6.4* 7.4* 7.4*  HCT 19.3* 23.0* 22.6*  MCV 94.3  --  92.1  PLT 203  --  174      Lab Results  Component Value Date   HEPBSAG Negative  06/10/2016      Microbiology:  No results found for this or any previous visit (from the past 240 hour(s)).  Coagulation Studies:  Recent Labs  12/17/16 0356  LABPROT 15.8*  INR 1.25    Urinalysis: No results for input(s): COLORURINE, LABSPEC, PHURINE, GLUCOSEU, HGBUR, BILIRUBINUR, KETONESUR, PROTEINUR, UROBILINOGEN, NITRITE, LEUKOCYTESUR in the last 72 hours.  Invalid input(s): APPERANCEUR    Imaging: Ct Abdomen Pelvis Wo Contrast  Result Date: 12/17/2016 CLINICAL DATA:  Abdominal pain. Symptomatic anemia. End-stage renal disease. EXAM: CT ABDOMEN AND PELVIS WITHOUT CONTRAST TECHNIQUE: Multidetector CT imaging of the abdomen and pelvis was performed following the standard protocol without IV contrast. COMPARISON:  04/12/2015 FINDINGS: Lower chest: No acute findings.  Cardiomegaly again noted. Hepatobiliary: No masses visualized on this unenhanced exam. Gallbladder is unremarkable. Pancreas: No mass or inflammatory process visualized on this unenhanced exam. Spleen:  Within normal limits in size. Adrenals/Urinary tract: Diffuse bilateral renal parenchymal atrophy and calcification seen, consistent with end-stage renal disease. No evidence of  renal mass or hydronephrosis. Stomach/Bowel: No evidence of obstruction, inflammatory process, or abnormal fluid collections. Normal appendix visualized. Diverticulosis mainly involving the descending colon, without evidence of diverticulitis. Vascular/Lymphatic: No pathologically enlarged lymph nodes identified. No evidence of abdominal aortic aneurysm. Aortic atherosclerosis. Reproductive: Prior hysterectomy noted. Adnexal regions are unremarkable in appearance. Other: Small epigastric ventral hernia containing only fat, without significant change. Musculoskeletal: No suspicious bone lesions identified. Old fracture deformity of left inferior pubic ramus and renal osteodystrophy noted. IMPRESSION: No acute findings. Diffuse bilateral renal parenchymal  atrophy and calcification, consistent with end-stage renal disease. No evidence hydronephrosis. Colonic diverticulosis, without radiographic evidence of diverticulitis. Small epigastric ventral hernia containing only fat. Aortic atherosclerosis. Electronically Signed   By: Earle Gell M.D.   On: 12/17/2016 16:47   Dg Chest Port 1 View  Result Date: 12/17/2016 CLINICAL DATA:  Acute onset of central chest pain and rhonchi. Initial encounter. EXAM: PORTABLE CHEST 1 VIEW COMPARISON:  Chest radiograph performed 09/19/2015 FINDINGS: The lungs are well-aerated. Increased interstitial markings raise concern for pulmonary edema, though pneumonia could have a similar appearance. There is no evidence of pleural effusion or pneumothorax. The cardiomediastinal silhouette is enlarged. No acute osseous abnormalities are seen. A vascular stent is noted overlying the left axilla. IMPRESSION: Increased interstitial markings raise concern for pulmonary edema, though pneumonia could have a similar appearance. Underlying cardiomegaly noted. Electronically Signed   By: Garald Balding M.D.   On: 12/17/2016 04:54     Medications:   . sodium chloride    . sodium chloride     . amLODipine  10 mg Oral Daily  . atorvastatin  40 mg Oral Daily  . carvedilol  6.25 mg Oral BID WC  . gabapentin  300 mg Oral QHS  . losartan  100 mg Oral Daily  . pantoprazole (PROTONIX) IV  40 mg Intravenous Q12H  . polyethylene glycol-electrolytes  4,000 mL Oral Once  . sodium chloride flush  3 mL Intravenous Q12H  . spironolactone  25 mg Oral Daily  . traZODone  25 mg Oral QHS   sodium chloride, acetaminophen **OR** acetaminophen, ondansetron **OR** ondansetron (ZOFRAN) IV, sodium chloride flush  Assessment/ Plan:  71 y.o. female with ESRD on hemodialysis, GERD, anemia, atrial fibrillation, CVA, coronary artery disease, pulmonary hypertension, carotid artery stenosis  8/4- presents with severe anemia, suspected GI bleed, chest  pain  [facility] MWF [address]   1. End Stage Renal Disease: on hemodialysis. MWF schedule. With hypokalemia  - resume MWF schedule. We will plan for Monday afternoon after colonoscopy  2. Anemia of chronic kidney disease: with GI bleed. Hemoglobin 6.4 on admission.  Status post PRBC transfusion - Appreciate GI input - EPO with HD treatment  3. Hypokalemia - continue spironolactone   4. Secondary Hyperparathyroidism:  - Hold calcium acetate until after colonoscopy and when able to eat a normal diet   LOS: 1 Delvonte Berenson 8/5/201810:03 AM  [facility] [place], [place]

## 2016-12-18 NOTE — Plan of Care (Signed)
Problem: Bowel/Gastric: Goal: Will show no signs and symptoms of gastrointestinal bleeding Outcome: Progressing Golytely tolerated with yellow liquid stools returned at this time.

## 2016-12-18 NOTE — Progress Notes (Signed)
[facility] [place] NAME: Toni Carlson    MR#:  [id number]  DATE OF BIRTH:  07-29-1945  SUBJECTIVE:  Came in with chest pain at the facility. She was chest pain-free Found to have hemoglobin of 6.4 and heme positive stools. Patient is hungry and wants to eat regular food REVIEW OF SYSTEMS:   Review of Systems  Constitutional: Negative for chills, fever and weight loss.  HENT: Negative for ear discharge, ear pain and nosebleeds.   Eyes: Negative for blurred vision, pain and discharge.  Respiratory: Negative for sputum production, shortness of breath, wheezing and stridor.   Cardiovascular: Negative for chest pain, palpitations, orthopnea and PND.  Gastrointestinal: Negative for abdominal pain, diarrhea, nausea and vomiting.  Genitourinary: Negative for frequency and urgency.  Musculoskeletal: Negative for back pain and joint pain.  Neurological: Positive for weakness. Negative for sensory change, speech change and focal weakness.  Psychiatric/Behavioral: Negative for depression and hallucinations. The patient is not nervous/anxious.    Tolerating Diet:yers Tolerating PT: pending  DRUG ALLERGIES:  No Known Allergies  VITALS:  Blood pressure (!) 130/49, pulse (!) 50, temperature 97.7 F (36.5 C), temperature source Axillary, resp. rate 19, height 5\' 7"  (1.702 m), weight 69.9 kg (154 lb 1.6 oz), SpO2 100 %.  PHYSICAL EXAMINATION:   Physical Exam  GENERAL:  71 y.o.-year-old patient lying in the bed with no acute distress. Chronically ill EYES: Pupils equal, round, reactive to light and accommodation. No scleral icterus. Extraocular muscles intact. Anemic HEENT: Head atraumatic, normocephalic. Oropharynx and nasopharynx clear.  NECK:  Supple, no jugular venous distention. No thyroid enlargement, no tenderness.  LUNGS: Normal breath sounds bilaterally, no wheezing, rales, rhonchi. No use of accessory muscles of respiration.   CARDIOVASCULAR: S1, S2 normal. No murmurs, rubs, or gallops.  ABDOMEN: Soft, nontender, nondistended. Bowel sounds present. No organomegaly or mass.  EXTREMITIES: No cyanosis, clubbing or edema b/l.    NEUROLOGIC: Cranial nerves II through XII are intact. No focal Motor or sensory deficits b/l.   PSYCHIATRIC:  patient is alert and oriented x 3.  SKIN: No obvious rash, lesion, or ulcer.   LABORATORY PANEL:  CBC  Recent Labs Lab 12/18/16 0544  WBC 5.9  HGB 7.4*  HCT 22.6*  PLT 174    Chemistries   Recent Labs Lab 12/17/16 0356  NA 138  K 3.1*  CL 98*  CO2 29  GLUCOSE 82  BUN 17  CREATININE 4.40*  CALCIUM 8.2*  AST 16  ALT 9*  ALKPHOS 568*  BILITOT 0.5   Cardiac Enzymes  Recent Labs Lab 12/17/16 2144  TROPONINI 0.03*   RADIOLOGY:  Ct Abdomen Pelvis Wo Contrast  Result Date: 12/17/2016 CLINICAL DATA:  Abdominal pain. Symptomatic anemia. End-stage renal disease. EXAM: CT ABDOMEN AND PELVIS WITHOUT CONTRAST TECHNIQUE: Multidetector CT imaging of the abdomen and pelvis was performed following the standard protocol without IV contrast. COMPARISON:  04/12/2015 FINDINGS: Lower chest: No acute findings.  Cardiomegaly again noted. Hepatobiliary: No masses visualized on this unenhanced exam. Gallbladder is unremarkable. Pancreas: No mass or inflammatory process visualized on this unenhanced exam. Spleen:  Within normal limits in size. Adrenals/Urinary tract: Diffuse bilateral renal parenchymal atrophy and calcification seen, consistent with end-stage renal disease. No evidence of renal mass or hydronephrosis. Stomach/Bowel: No evidence of obstruction, inflammatory process, or abnormal fluid collections. Normal appendix visualized. Diverticulosis mainly involving the descending colon, without evidence of diverticulitis. Vascular/Lymphatic: No pathologically enlarged lymph nodes identified. No evidence of abdominal  aortic aneurysm. Aortic atherosclerosis. Reproductive: Prior  hysterectomy noted. Adnexal regions are unremarkable in appearance. Other: Small epigastric ventral hernia containing only fat, without significant change. Musculoskeletal: No suspicious bone lesions identified. Old fracture deformity of left inferior pubic ramus and renal osteodystrophy noted. IMPRESSION: No acute findings. Diffuse bilateral renal parenchymal atrophy and calcification, consistent with end-stage renal disease. No evidence hydronephrosis. Colonic diverticulosis, without radiographic evidence of diverticulitis. Small epigastric ventral hernia containing only fat. Aortic atherosclerosis. Electronically Signed   By: Earle Gell M.D.   On: 12/17/2016 16:47   Dg Chest Port 1 View  Result Date: 12/17/2016 CLINICAL DATA:  Acute onset of central chest pain and rhonchi. Initial encounter. EXAM: PORTABLE CHEST 1 VIEW COMPARISON:  Chest radiograph performed 09/19/2015 FINDINGS: The lungs are well-aerated. Increased interstitial markings raise concern for pulmonary edema, though pneumonia could have a similar appearance. There is no evidence of pleural effusion or pneumothorax. The cardiomediastinal silhouette is enlarged. No acute osseous abnormalities are seen. A vascular stent is noted overlying the left axilla. IMPRESSION: Increased interstitial markings raise concern for pulmonary edema, though pneumonia could have a similar appearance. Underlying cardiomegaly noted. Electronically Signed   By: Garald Balding M.D.   On: 12/17/2016 04:54   ASSESSMENT AND PLAN:  Toni Carlson is a 71 y.o. female with multiple medical problems including CHF, ESRD (on HD MWF) admitted for chest pain and found to have anemia with a Hgb 6.4. On Plavix and aspirin as an outpt. Denies black or red stools. Denies N/V/hematemesis. Denies abdominal pain. Heme positive in the ER.  1. Symptomatic anemia -Patient has history of anemia of chronic disease- -she has had EGD in 2017 which showed large polypoid mass that was  negative for malignancy and normal duodenum and stomach. -Seen by GI. Patient will get colonoscopy on Monday. -Transfuse 2nd unit today -Came in with hemoglobin of 6.4----one unit of blood transfusion----7.4--1 more unit---hgb in am  2. Chest pain, mildly elevated troponin secondary to demand ischemia in the setting of anemia -Appears atypical. No EKG changes. -When necessary nitroglycerin  3. Hypokalemia -Replete  4. Coronary artery disease -Continue cardiac meds  5. End-stage renal disease on dialysis =-Nephrology consultation for in-house dialysis  6. Chronic diastolic heart failure--- stable. Patient is not in heart failure  Case discussed with Care Management/Social Worker. Management plans discussed with the patient, family and they are in agreement.  CODE STATUS: *Full*  DVT Prophylaxis: SCD  TOTAL TIME TAKING CARE OF THIS PATIENT: *25* minutes.  >50% time spent on counselling and coordination of care  POSSIBLE D/C IN *1-2* DAYS, DEPENDING ON CLINICAL CONDITION.  Note: This dictation was prepared with Dragon dictation along with smaller phrase technology. Any transcriptional errors that result from this process are unintentional.  Jakki Doughty M.D on 12/18/2016 at 12:15 PM  Between 7am to 6pm - Pager - [phone]  After 6pm go to [email or web address] - password [email or web address] [facility]  Office  [phone]  CC: Primary care physician; Murlean Iba, MD

## 2016-12-18 NOTE — Clinical Social Work Note (Signed)
Clinical Social Work Assessment  Patient Details  Name: Toni Carlson MRN: [id number] Date of Birth: [birth year]  Date of referral:  12/18/16               Reason for consult:  Facility Placement                Permission sought to share information with:  Chartered certified accountant granted to share information::  Yes, Verbal Permission Granted  Name::        Agency::  [facility]  Relationship::     Contact Information:     Housing/Transportation Living arrangements for the past 2 months:  [place] of Information:  Patient, Facility Patient Interpreter Needed:  None Criminal Activity/Legal Involvement Pertinent to Current Situation/Hospitalization:  No - Comment as needed Significant Relationships:  Adult Children Lives with:  Facility Resident Do you feel safe going back to the place where you live?  Yes Need for family participation in patient care:  No (Coment)  Care giving concerns:  Patient admitted from ALF   Social Worker assessment / plan:  The CSW met with the patient to discuss discharge planning. The patient confirmed that she is a resident of [facility] and will return at discharge. The facility confirms this. The patient indicated that she will need transportation at the time of discharge. CSW will continue to follow to assist with discharge facilitation and transportation to her ALF.   Employment status:  Retired Forensic scientist:  Medicare PT Recommendations:  Not assessed at this time Information / Referral to community resources:     Patient/Family's Response to care:  The patient thanked the CSW.  Patient/Family's Understanding of and Emotional Response to Diagnosis, Current Treatment, and Prognosis:  The patient understands the plan and is in agreement.  Emotional Assessment Appearance:  Appears stated age Attitude/Demeanor/Rapport:   (Pleasant and cooperative) Affect (typically observed):   Appropriate, Pleasant Orientation:  Oriented to Self, Oriented to Place, Oriented to  Time, Oriented to Situation Alcohol / Substance use:  Never Used Psych involvement (Current and /or in the community):  No (Comment)  Discharge Needs  Concerns to be addressed:  Discharge Planning Concerns Readmission within the last 30 days:  No Current discharge risk:  Chronically ill Barriers to Discharge:  Continued Medical Work up   [employer], LCSW 12/18/2016, 4:07 PM

## 2016-12-18 NOTE — Progress Notes (Signed)
Gastroenterology Progress Note    Toni Carlson 71 y.o. 02-15-1946   Subjective: Wants to eat. Denies abdominal pain.  Objective: Vital signs in last 24 hours: Vitals:   12/18/16 1000 12/18/16 1029  BP: (!) 136/51 (!) 130/49  Pulse: (!) 44 (!) 50  Resp: 18 19  Temp: (!) 97.5 F (36.4 C) 97.7 F (36.5 C)    Physical Exam: Gen: lethargic, no acute distress, thin CV: RRR Chest: CTA B Abd: diffuse tenderness with guarding, soft, nondistended, +BS Ext: no edema  Lab Results:  Recent Labs  12/17/16 0356  NA 138  K 3.1*  CL 98*  CO2 29  GLUCOSE 82  BUN 17  CREATININE 4.40*  CALCIUM 8.2*    Recent Labs  12/17/16 0356  AST 16  ALT 9*  ALKPHOS 568*  BILITOT 0.5  PROT 6.3*  ALBUMIN 3.2*    Recent Labs  12/17/16 0356 12/17/16 1600 12/18/16 0544  WBC 7.3  --  5.9  NEUTROABS 6.2  --   --   HGB 6.4* 7.4* 7.4*  HCT 19.3* 23.0* 22.6*  MCV 94.3  --  92.1  PLT 203  --  174    Recent Labs  12/17/16 0356  LABPROT 15.8*  INR 1.25      Assessment/Plan: Symptomatic anemia and heme positive without overt bleeding. Plavix on hold. Noncontrast abd/pelvis CT negative for acute findings. Colon prep today. Colonoscopy/EGD tomorrow morning by Dr. Vicente Males. Clear liquid diet today. NPO p MN. Encouraged patient to drink all of the colon prep when it comes. Dialysis planned for Monday afternoon by Nephrology.   Ashley C. 12/18/2016, 11:07 AMPatient ID: Toni Carlson, female   DOB: 11-16-1945, 71 y.o.   MRN: [id number]

## 2016-12-19 ENCOUNTER — Encounter
Admission: EM | Disposition: A | Discharge status: Nursing Home (Includes ICF) | Payer: Self-pay | Source: Home / Self Care | Attending: Internal Medicine

## 2016-12-19 ENCOUNTER — Inpatient Hospital Stay: Payer: Medicare Other | Admitting: Anesthesiology

## 2016-12-19 DIAGNOSIS — D631 Anemia in chronic kidney disease: Secondary | ICD-10-CM

## 2016-12-19 DIAGNOSIS — D500 Iron deficiency anemia secondary to blood loss (chronic): Secondary | ICD-10-CM

## 2016-12-19 DIAGNOSIS — K5730 Diverticulosis of large intestine without perforation or abscess without bleeding: Secondary | ICD-10-CM

## 2016-12-19 DIAGNOSIS — K639 Disease of intestine, unspecified: Secondary | ICD-10-CM

## 2016-12-19 DIAGNOSIS — Z992 Dependence on renal dialysis: Secondary | ICD-10-CM

## 2016-12-19 DIAGNOSIS — N186 End stage renal disease: Secondary | ICD-10-CM

## 2016-12-19 HISTORY — PX: COLONOSCOPY WITH PROPOFOL: SHX5780

## 2016-12-19 HISTORY — PX: ESOPHAGOGASTRODUODENOSCOPY (EGD) WITH PROPOFOL: SHX5813

## 2016-12-19 LAB — BPAM RBC
Blood Product Expiration Date: 201808172359
Blood Product Expiration Date: 201808172359
ISSUE DATE / TIME: 201808041131
ISSUE DATE / TIME: 201808051001
UNIT TYPE AND RH: 6200
Unit Type and Rh: 6200

## 2016-12-19 LAB — POCT I-STAT 4, (NA,K, GLUC, HGB,HCT)
Glucose, Bld: 71 mg/dL (ref 65–99)
HCT: 27 % — ABNORMAL LOW (ref 36.0–46.0)
HEMOGLOBIN: 9.2 g/dL — AB (ref 12.0–15.0)
POTASSIUM: 3.8 mmol/L (ref 3.5–5.1)
SODIUM: 133 mmol/L — AB (ref 135–145)

## 2016-12-19 LAB — TYPE AND SCREEN
ABO/RH(D): A POS
Antibody Screen: NEGATIVE
Unit division: 0
Unit division: 0

## 2016-12-19 LAB — HEMOGLOBIN: Hemoglobin: 9.1 g/dL — ABNORMAL LOW (ref 12.0–16.0)

## 2016-12-19 SURGERY — COLONOSCOPY WITH PROPOFOL
Anesthesia: General

## 2016-12-19 MED ORDER — MIDAZOLAM HCL 2 MG/2ML IJ SOLN
INTRAMUSCULAR | Status: DC | PRN
  Administered 2016-12-19: 1 mg via INTRAVENOUS

## 2016-12-19 MED ORDER — ONDANSETRON HCL 4 MG/2ML IJ SOLN
INTRAMUSCULAR | Status: AC
  Filled 2016-12-19: qty 2

## 2016-12-19 MED ORDER — PROPOFOL 500 MG/50ML IV EMUL
INTRAVENOUS | Status: AC
  Filled 2016-12-19: qty 50

## 2016-12-19 MED ORDER — SODIUM CHLORIDE 0.9 % IV SOLN
INTRAVENOUS | Status: DC
  Administered 2016-12-19 (×2): via INTRAVENOUS

## 2016-12-19 MED ORDER — SPIRONOLACTONE 25 MG PO TABS: 25.0000 mg | ORAL_TABLET | Freq: Every day | ORAL | 1 refills | Status: DC

## 2016-12-19 MED ORDER — MIDAZOLAM HCL 2 MG/2ML IJ SOLN
INTRAMUSCULAR | Status: AC
  Filled 2016-12-19: qty 2

## 2016-12-19 MED ORDER — LIDOCAINE HCL (PF) 2 % IJ SOLN
INTRAMUSCULAR | Status: AC
  Filled 2016-12-19: qty 2

## 2016-12-19 MED ORDER — LIDOCAINE HCL (CARDIAC) 20 MG/ML IV SOLN
INTRAVENOUS | Status: DC | PRN
  Administered 2016-12-19: 60 mg via INTRAVENOUS

## 2016-12-19 MED ORDER — PROPOFOL 10 MG/ML IV BOLUS
INTRAVENOUS | Status: DC | PRN
  Administered 2016-12-19 (×2): 20 mg via INTRAVENOUS
  Administered 2016-12-19 (×2): 30 mg via INTRAVENOUS

## 2016-12-19 MED ORDER — ONDANSETRON HCL 4 MG/2ML IJ SOLN
4.0000 mg | Freq: Once | INTRAMUSCULAR | Status: AC
  Administered 2016-12-19: 4 mg via INTRAVENOUS

## 2016-12-19 NOTE — Progress Notes (Signed)
Patient without complaints.  Released to care of [facility] for transport to [facility].

## 2016-12-19 NOTE — Op Note (Signed)
[facility] Gastroenterology Patient Name: Toni Carlson Procedure Date: 12/19/2016 9:28 AM MRN: [id number] Account #: [id number] Date of Birth: 1945/12/12 Admit Type: Inpatient Age: 71 Room: [facility] ENDO ROOM 4 Gender: Female Note Status: Finalized Procedure:            Upper GI endoscopy Indications:          Iron deficiency anemia secondary to chronic blood loss Providers:            Jonathon Bellows MD, MD Medicines:            Monitored Anesthesia Care Complications:        No immediate complications. Procedure:            Pre-Anesthesia Assessment:                       - Prior to the procedure, a History and Physical was                        performed, and patient medications, allergies and                        sensitivities were reviewed. The patient's tolerance of                        previous anesthesia was reviewed.                       - The risks and benefits of the procedure and the                        sedation options and risks were discussed with the                        patient. All questions were answered and informed                        consent was obtained.                       - ASA Grade Assessment: IV - A patient with severe                        systemic disease that is a constant threat to life.                       After obtaining informed consent, the endoscope was                        passed under direct vision. Throughout the procedure,                        the patient's blood pressure, pulse, and oxygen                        saturations were monitored continuously. The Endoscope                        was introduced through the mouth, and advanced to the  third part of duodenum. The upper GI endoscopy was                        accomplished with ease. The patient tolerated the                        procedure well. Findings:      The esophagus was normal.      The stomach was normal.      The examined  duodenum was normal. Impression:           - Normal esophagus.                       - Normal stomach.                       - Normal examined duodenum.                       - No specimens collected. Recommendation:       - Perform a colonoscopy today. Procedure Code(s):    --- Professional ---                       [phone], Esophagogastroduodenoscopy, flexible, transoral;                        diagnostic, including collection of specimen(s) by                        brushing or washing, when performed (separate procedure) Diagnosis Code(s):    --- Professional ---                       D50.0, Iron deficiency anemia secondary to blood loss                        (chronic) CPT copyright 2016 American Medical Association. All rights reserved. The codes documented in this report are preliminary and upon coder review may  be revised to meet current compliance requirements. Jonathon Bellows, MD Jonathon Bellows MD, MD 12/19/2016 9:39:45 AM This report has been signed electronically. Number of Addenda: 0 Note Initiated On: 12/19/2016 9:28 AM      [facility]

## 2016-12-19 NOTE — Op Note (Signed)
[facility] Gastroenterology Patient Name: Toni Carlson Procedure Date: 12/19/2016 9:26 AM MRN: [id number] Account #: [id number] Date of Birth: 1945-07-23 Admit Type: Inpatient Age: 71 Room: [facility] ENDO ROOM 4 Gender: Female Note Status: Finalized Procedure:            Colonoscopy Indications:          Iron deficiency anemia secondary to chronic blood loss Providers:            Jonathon Bellows MD, MD Medicines:            Monitored Anesthesia Care Complications:        No immediate complications. Procedure:            Pre-Anesthesia Assessment:                       - Prior to the procedure, a History and Physical was                        performed, and patient medications, allergies and                        sensitivities were reviewed. The patient's tolerance of                        previous anesthesia was reviewed.                       - The risks and benefits of the procedure and the                        sedation options and risks were discussed with the                        patient. All questions were answered and informed                        consent was obtained.                       - ASA Grade Assessment: IV - A patient with severe                        systemic disease that is a constant threat to life.                       After obtaining informed consent, the colonoscope was                        passed under direct vision. Throughout the procedure,                        the patient's blood pressure, pulse, and oxygen                        saturations were monitored continuously. The                        Colonoscope was introduced through the anus and                        advanced to the the  cecum, identified by the                        appendiceal orifice, IC valve and transillumination.                        The colonoscopy was technically difficult and complex                        due to significant looping and a tortuous colon.                      Successful completion of the procedure was aided by                        applying abdominal pressure. The patient tolerated the                        procedure well. The quality of the bowel preparation                        was poor. Findings:      The perianal and digital rectal examinations were normal.      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       The mass was non-circumferential. In addition, its diameter measured       twenty to twenty-five mm. No bleeding was present. Mucosa was biopsied       with a cold forceps for histology. One specimen bottle was sent to       pathology. The mass felt very hard on biopsy      A 8 mm polyp was found in the ascending colon. The polyp was       semi-sessile. no polypectomy done      The exam was otherwise without abnormality on direct and retroflexion       views.      Multiple small-mouthed diverticula were found in the sigmoid colon.      The exam was otherwise without abnormality on direct and retroflexion       views. Impression:           - Preparation of the colon was poor.                       - Rule out malignancy, tumor at the ileocecal valve.                        Biopsied.                       - One 8 mm polyp in the ascending colon.                       - The examination was otherwise normal on direct and                        retroflexion views.                       - Diverticulosis in the sigmoid colon.                       -  The examination was otherwise normal on direct and                        retroflexion views. Recommendation:       - Return patient to hospital ward for ongoing care.                       - Return patient to hospital ward for ongoing care.                       - Advance diet as tolerated.                       - Continue present medications.                       - Await pathology results.                       - Based on the pathology  result will need surgery                        evaluation for resection of cecum and part of the                        ascending colon with ileo-colonic anastamosis for the                        large polypoid mass. Procedure Code(s):    --- Professional ---                       [phone], Colonoscopy, flexible; diagnostic, including                        collection of specimen(s) by brushing or washing, when                        performed (separate procedure) Diagnosis Code(s):    --- Professional ---                       D49.0, Neoplasm of unspecified behavior of digestive                        system                       D12.2, Benign neoplasm of ascending colon                       D50.0, Iron deficiency anemia secondary to blood loss                        (chronic)                       K57.30, Diverticulosis of large intestine without                        perforation or abscess without bleeding CPT copyright 2016 American Medical Association. All rights reserved. The codes documented in this report are preliminary and upon coder review may  be revised to meet current compliance requirements.  Jonathon Bellows, MD Jonathon Bellows MD, MD 12/19/2016 10:20:18 AM This report has been signed electronically. Number of Addenda: 0 Note Initiated On: 12/19/2016 9:26 AM Scope Withdrawal Time: 0 hours 8 minutes 4 seconds  Total Procedure Duration: 0 hours 31 minutes 55 seconds       [facility]

## 2016-12-19 NOTE — Progress Notes (Signed)
HD completed without issue. Patient tolerated well. Goal met  

## 2016-12-19 NOTE — Progress Notes (Signed)
Discharge instructions reviewed.  Understanding Voiced.

## 2016-12-19 NOTE — Progress Notes (Signed)
Pre HD  

## 2016-12-19 NOTE — Care Management Note (Addendum)
Case Management Note  Patient Details  Name: Ebelin Dillehay MRN: [id number] Date of Birth: Jul 30, 1945  Subjective/Objective:                 Presents from [facility]. Chronic HD at [facility].  CSW aware  Action/Plan: E faxed referral information to Elvera Bicker with [facility]    Expected Discharge Date:                  Expected Discharge Plan:     In-House Referral:     Discharge planning Services     Post Acute Care Choice:    Choice offered to:     DME Arranged:    DME Agency:     HH Arranged:    HH Agency:     Status of Service:     If discussed at [employer] [facility] [employer], dates discussed:    Additional Comments:  Katrina Stack, RN 12/19/2016, 8:50 AM

## 2016-12-19 NOTE — Clinical Social Work Note (Addendum)
Pt is ready for discharge today and will likely return to [facility] after HD. Case manager from [facility] will conducting an assessment as pt has been out of the facility for more than 24 hours. Pt is aware and agreeable to discharge plan. Pt's RN is aware as well. Pt will be transported via [facility]. Pt declined to have CSW notify her family as pt's family is aware for discharge. CSW is signing off as no further needs identified.   Darden Dates, MSW, LCSW  Clinical Social Worker  [phone]  ADDENDUM: CSW spoke with [facility] and pt is able to return to facility.  Darden Dates, MSW, LCSW

## 2016-12-19 NOTE — H&P (Signed)
Jonathon Bellows MD [address] [place], [place] [address] Phone: [phone] Fax : [phone]  Primary Care Physician:  Murlean Iba, MD Primary Gastroenterologist:  Dr. Jonathon Bellows   Pre-Procedure History & Physical: HPI:  Toni Carlson is a 71 y.o. female is here for an endoscopy and colonoscopy.   Past Medical History:  Diagnosis Date  . A-fib (HCC)    a. on warfarin  . Anemia   . Carotid artery stenosis    a. ultrasound 03/2015: RICA 43-56% stenosis, LICA less than 86% stenosis  . Chronic diastolic CHF (congestive heart failure) ([place])    a. echo 03/2015: EF 60-65%, normal wall motion, diastolic parameters were c/w restrictive physiology, indicative of decreased LV diastolic compliance and/or increased LA pressure, mild AS, mild MR, left atrium was severely dilated, RA was severely dilated, PASP was severely increased at 90 mm Hg  . Coronary artery disease, non-occlusive    a. cardiac cath 2013  . ESRD (end stage renal disease) ([place])    On Tue-Thur-Sat dialysis  . H/O ischemic right MCA stroke    a. 03/2015; b. residual left-sided facial droop and slurred speech  . Hyperparathyroidism, secondary renal ([place])   . Hypertension   . Nicotine dependence   . Pulmonary HTN ([place])    as above  . Symptomatic bradycardia    a. history of symptomatic bradycardia; b. felt to be 2/2 metabolic abnormalities & required temp wire but no PPM, resolved with HD    Past Surgical History:  Procedure Laterality Date  . A/V FISTULAGRAM Left 10/25/2016   Procedure: A/V Fistulagram;  Surgeon: Katha Cabal, MD;  Location: [place] [facility] LAB;  Service: Cardiovascular;  Laterality: Left;  . ESOPHAGOGASTRODUODENOSCOPY (EGD) WITH PROPOFOL N/A 04/14/2015   Procedure: ESOPHAGOGASTRODUODENOSCOPY (EGD) WITH PROPOFOL;  Surgeon: Lucilla Lame, MD;  Location: [facility] ENDOSCOPY;  Service: Endoscopy;  Laterality: N/A;  . ESOPHAGOGASTRODUODENOSCOPY (EGD) WITH PROPOFOL N/A 07/17/2015   Procedure:  ESOPHAGOGASTRODUODENOSCOPY (EGD) WITH PROPOFOL;  Surgeon: Josefine Class, MD;  Location: [facility] ENDOSCOPY;  Service: Endoscopy;  Laterality: N/A;  . EUS N/A 09/24/2015   Procedure: UPPER ENDOSCOPIC ULTRASOUND (EUS) LINEAR;  Surgeon: Jola Schmidt, MD;  Location: [facility] ENDOSCOPY;  Service: Endoscopy;  Laterality: N/A;  . LOWER EXTREMITY ANGIOGRAPHY Right 06/09/2016   Procedure: Lower Extremity Angiography;  Surgeon: Algernon Huxley, MD;  Location: [place] [facility] LAB;  Service: Cardiovascular;  Laterality: Right;  . PERIPHERAL VASCULAR CATHETERIZATION N/A 02/23/2015   Procedure: A/V Shuntogram/Fistulagram;  Surgeon: Algernon Huxley, MD;  Location: [place] [facility] LAB;  Service: Cardiovascular;  Laterality: N/A;  . PERIPHERAL VASCULAR CATHETERIZATION N/A 02/23/2015   Procedure: A/V Shunt Intervention;  Surgeon: Algernon Huxley, MD;  Location: [place] [facility] LAB;  Service: Cardiovascular;  Laterality: N/A;    Prior to Admission medications   Medication Sig Start Date End Date Taking? Authorizing Provider  amLODipine (NORVASC) 10 MG tablet Take 1 tablet (10 mg total) by mouth daily. 09/19/15  Yes Carrie Mew, MD  aspirin EC 81 MG EC tablet Take 1 tablet (81 mg total) by mouth daily. 06/10/16  Yes Dew, Erskine Squibb, MD  atorvastatin (LIPITOR) 40 MG tablet Take 1 tablet (40 mg total) by mouth daily. 09/19/15  Yes Carrie Mew, MD  calcium acetate (PHOSLO) 667 MG capsule Take 1,334 mg by mouth 3 (three) times daily with meals.   Yes [provider]  carvedilol (COREG) 6.25 MG tablet Take 1 tablet (6.25 mg total) by mouth 2 (two) times daily with  a meal. Patient taking differently: Take 3.125 mg by mouth at bedtime.  09/19/15  Yes Carrie Mew, MD  cetirizine (ZYRTEC) 10 MG tablet Take 10 mg by mouth daily.   Yes [provider]  gabapentin (NEURONTIN) 300 MG capsule Take 300 mg by mouth at bedtime.   Yes [provider]  losartan (COZAAR) 100 MG tablet Take 1 tablet (100 mg total)  by mouth daily. 09/19/15  Yes Carrie Mew, MD  traZODone (DESYREL) 50 MG tablet Take 25 mg by mouth at bedtime.  05/18/16  Yes [provider]  clopidogrel (PLAVIX) 75 MG tablet Take 1 tablet (75 mg total) by mouth daily. 06/10/16   Algernon Huxley, MD  loperamide (IMODIUM) 2 MG capsule Take 2 mg by mouth as needed for diarrhea or loose stools.    [provider]  ondansetron (ZOFRAN) 4 MG tablet Take 4 mg by mouth every 8 (eight) hours as needed for nausea or vomiting.    [provider]  pantoprazole (PROTONIX) 40 MG tablet Take 1 tablet (40 mg total) by mouth 2 (two) times daily. Patient taking differently: Take 40 mg by mouth daily.  09/19/15   Carrie Mew, MD  polyethylene glycol [facility] / Floria Raveling) packet Take 17 g by mouth daily as needed for mild constipation. 09/19/15   Carrie Mew, MD  trolamine salicylate (ASPERCREME) 10 % cream Apply topically 2 (two) times daily as needed for muscle pain. 09/19/15   Carrie Mew, MD    Allergies as of 12/17/2016  . (No Known Allergies)    Family History  Problem Relation Age of Onset  . CAD Father   . Dementia Mother   . Lupus Sister     Social History   Social History  . Marital status: Divorced    Spouse name: N/A  . Number of children: N/A  . Years of education: N/A   Occupational History  . retired    Social History Main Topics  . Smoking status: Former Smoker    Packs/day: 0.25    Years: 30.00    Types: Cigarettes    Quit date: 11/20/2013  . Smokeless tobacco: Never Used  . Alcohol use No  . Drug use: No  . Sexual activity: Not on file   Other Topics Concern  . Not on file   Social History Narrative   Lives at home independently. Has a cane for ambulation.    Review of Systems: See HPI, otherwise negative ROS  Physical Exam: BP (!) 184/63   Pulse 60   Temp 98.2 F (36.8 C) (Tympanic)   Resp 20   Ht 5\' 7"  (1.702 m)   Wt 154 lb (69.9 kg)   LMP  (LMP Unknown)   SpO2 100%    BMI 24.12 kg/m  General:   Alert,  pleasant and cooperative in NAD Head:  Normocephalic and atraumatic. Neck:  Supple; no masses or thyromegaly. Lungs:  Clear throughout to auscultation.    Heart:  Regular rate and rhythm. Abdomen:  Soft, nontender and nondistended. Normal bowel sounds, without guarding, and without rebound.   Neurologic:  Alert and  oriented x4;  grossly normal neurologically.  Impression/Plan: Toni Carlson is here for an endoscopy and colonoscopy to be performed for anemia  Risks, benefits, limitations, and alternatives regarding  endoscopy and colonoscopy have been reviewed with the patient.  Questions have been answered.  All parties agreeable.   Jonathon Bellows, MD  12/19/2016, 9:26 AM

## 2016-12-19 NOTE — NC FL2 (Signed)
[place] LEVEL OF CARE SCREENING TOOL     IDENTIFICATION  Patient Name: Toni Carlson Birthdate: November 12, 1945 Sex: female Admission Date (Current Location): 12/17/2016  [place] and [place] Number:  Engineering geologist and Address:  [facility], [address] [place], [place] [address]      Provider Number: [id number]  Attending Physician Name and Address:  Fritzi Mandes, MD  Relative Name and Phone Number:       Current Level of Care: Hospital Recommended Level of Care: [place] Prior Approval Number:    Date Approved/Denied:   PASRR Number:    Discharge Plan: Domiciliary (Rest home)    Current Diagnoses: Patient Active Problem List   Diagnosis Date Noted  . Complication from renal dialysis device 11/27/2016  . Atherosclerosis of artery of extremity with rest pain ([place]) 06/09/2016  . Atherosclerosis of native arteries of extremity with rest pain ([place]) 05/24/2016  . Gastric polyp 08/18/2015  . Anemia 07/15/2015  . GI bleed 07/15/2015  . Polyneuropathy 07/08/2015  . Degenerative disc disease, lumbar 07/08/2015  . Pancytopenia ([place]) 07/08/2015  . Generalized weakness 07/08/2015  . End stage renal disease ([place]) 07/08/2015  . Atherosclerotic peripheral vascular disease ([place]) 07/08/2015  . Pain in both feet 07/02/2015  . Gastric ulceration   . Acute GI bleeding   . GIB (gastrointestinal bleeding) 04/12/2015  . Left-sided weakness 04/01/2015  . Essential hypertension, malignant 04/01/2015  . Anemia of chronic disease 04/01/2015  . Elevated troponin 04/01/2015  . Pulmonary hypertension ([place]) 04/01/2015  . Atrial fibrillation ([place])   . CVA (cerebral infarction) 03/26/2015    Orientation RESPIRATION BLADDER Height & Weight     Self, Time, Situation, Place  O2 (2L o2) Continent Weight: 154 lb (69.9 kg) Height:  5\' 7"  (170.2 cm)  BEHAVIORAL SYMPTOMS/MOOD NEUROLOGICAL BOWEL NUTRITION STATUS      Continent Diet  (Renal diet)  AMBULATORY STATUS COMMUNICATION OF NEEDS Skin   Independent Verbally Normal                       Personal Care Assistance Level of Assistance  Bathing, Feeding, Dressing Bathing Assistance: Limited assistance Feeding assistance: Independent Dressing Assistance: Limited assistance     Functional Limitations Info             SPECIAL CARE FACTORS FREQUENCY                       Contractures Contractures Info: Not present    Additional Factors Info  Code Status, Allergies Code Status Info: Full Allergies Info: NKA           Discharge Medications: Please see discharge summary for a list of discharge medications. Current Discharge Medication List       START taking these medications   Details  spironolactone (ALDACTONE) 25 MG tablet Take 1 tablet (25 mg total) by mouth daily. Qty: 30 tablet, Refills: 1         CONTINUE these medications which have NOT CHANGED   Details  amLODipine (NORVASC) 10 MG tablet Take 1 tablet (10 mg total) by mouth daily. Qty: 30 tablet, Refills: 0    aspirin EC 81 MG EC tablet Take 1 tablet (81 mg total) by mouth daily. Qty: 90 tablet, Refills: 3    atorvastatin (LIPITOR) 40 MG tablet Take 1 tablet (40 mg total) by mouth daily. Qty: 30 tablet, Refills: 0    carvedilol (COREG) 6.25 MG tablet  Take 1 tablet (6.25 mg total) by mouth 2 (two) times daily with a meal. Qty: 60 tablet, Refills: 0    cetirizine (ZYRTEC) 10 MG tablet Take 10 mg by mouth daily.    gabapentin (NEURONTIN) 300 MG capsule Take 300 mg by mouth at bedtime.    losartan (COZAAR) 100 MG tablet Take 1 tablet (100 mg total) by mouth daily. Qty: 30 tablet, Refills: 0    traZODone (DESYREL) 50 MG tablet Take 25 mg by mouth at bedtime.     clopidogrel (PLAVIX) 75 MG tablet Take 1 tablet (75 mg total) by mouth daily. Qty: 30 tablet, Refills: 5    loperamide (IMODIUM) 2 MG capsule Take 2 mg by mouth as needed for diarrhea or loose  stools.    ondansetron (ZOFRAN) 4 MG tablet Take 4 mg by mouth every 8 (eight) hours as needed for nausea or vomiting.    pantoprazole (PROTONIX) 40 MG tablet Take 1 tablet (40 mg total) by mouth 2 (two) times daily. Qty: 60 tablet, Refills: 0    polyethylene glycol (MIRALAX / GLYCOLAX) packet Take 17 g by mouth daily as needed for mild constipation. Qty: 14 each, Refills: 0    trolamine salicylate (ASPERCREME) 10 % cream Apply topically 2 (two) times daily as needed for muscle pain. Qty: 85 g, Refills: 0      Relevant Imaging Results:  Relevant Lab Results:   Additional Information [id number]   MWF [place]  Darden Dates, LCSW

## 2016-12-19 NOTE — Anesthesia Post-op Follow-up Note (Cosign Needed)
Anesthesia QCDR form completed.        

## 2016-12-19 NOTE — Progress Notes (Signed)
[facility], [place] 12/19/16  Subjective:  Patient just had EGD as well as colonoscopy performed. Colonoscopy revealed a mass at the ileocecal valve. Biopsy was taken of this. Patient due for hemodialysis later today.  Objective:  Vital signs in last 24 hours:  Temp:  [97.6 F (36.4 C)-98.4 F (36.9 C)] 98.2 F (36.8 C) (08/06 0823) Pulse Rate:  [46-60] 48 (08/06 1100) Resp:  [14-20] 18 (08/06 1100) BP: (115-184)/(44-64) 141/51 (08/06 1100) SpO2:  [92 %-100 %] 99 % (08/06 1100) Weight:  [69.9 kg (154 lb)] 69.9 kg (154 lb) (08/06 0823)  Weight change:  Filed Weights   12/17/16 0630 12/18/16 0500 12/19/16 0823  Weight: 69 kg (152 lb 3.2 oz) 69.9 kg (154 lb 1.6 oz) 69.9 kg (154 lb)    Intake/Output:    Intake/Output Summary (Last 24 hours) at 12/19/16 1121 Last data filed at 12/19/16 1014  Gross per 24 hour  Intake              830 ml  Output                4 ml  Net              826 ml     Physical Exam: General: No acute distress, laying in the bed   HEENT Pale conjunctiva, moist oral mucous membranes   Neck Supple   Pulm/lungs Normal breathing effort, clear to auscultation   CVS/Heart Regular rate and rhythm   Abdomen:  No rub or gallop   Extremities: No edema   Neurologic: Alert, oriented, conversant  Skin: No acute rashes   Access: Left upper arm AVF       Basic Metabolic Panel:   Recent Labs Lab 12/17/16 0356 12/19/16 0825  NA 138 133*  K 3.1* 3.8  CL 98*  --   CO2 29  --   GLUCOSE 82 71  BUN 17  --   CREATININE 4.40*  --   CALCIUM 8.2*  --      CBC:  Recent Labs Lab 12/17/16 0356 12/17/16 1600 12/18/16 0544 12/19/16 0403 12/19/16 0825  WBC 7.3  --  5.9  --   --   NEUTROABS 6.2  --   --   --   --   HGB 6.4* 7.4* 7.4* 9.1* 9.2*  HCT 19.3* 23.0* 22.6*  --  27.0*  MCV 94.3  --  92.1  --   --   PLT 203  --  174  --   --       Lab Results  Component Value Date   HEPBSAG Negative 06/10/2016       Microbiology:  No results found for this or any previous visit (from the past 240 hour(s)).  Coagulation Studies:  Recent Labs  12/17/16 0356  LABPROT 15.8*  INR 1.25    Urinalysis: No results for input(s): COLORURINE, LABSPEC, PHURINE, GLUCOSEU, HGBUR, BILIRUBINUR, KETONESUR, PROTEINUR, UROBILINOGEN, NITRITE, LEUKOCYTESUR in the last 72 hours.  Invalid input(s): APPERANCEUR    Imaging: Ct Abdomen Pelvis Wo Contrast  Result Date: 12/17/2016 CLINICAL DATA:  Abdominal pain. Symptomatic anemia. End-stage renal disease. EXAM: CT ABDOMEN AND PELVIS WITHOUT CONTRAST TECHNIQUE: Multidetector CT imaging of the abdomen and pelvis was performed following the standard protocol without IV contrast. COMPARISON:  04/12/2015 FINDINGS: Lower chest: No acute findings.  Cardiomegaly again noted. Hepatobiliary: No masses visualized on this unenhanced exam. Gallbladder is unremarkable. Pancreas: No mass or inflammatory process visualized on this unenhanced exam.  Spleen:  Within normal limits in size. Adrenals/Urinary tract: Diffuse bilateral renal parenchymal atrophy and calcification seen, consistent with end-stage renal disease. No evidence of renal mass or hydronephrosis. Stomach/Bowel: No evidence of obstruction, inflammatory process, or abnormal fluid collections. Normal appendix visualized. Diverticulosis mainly involving the descending colon, without evidence of diverticulitis. Vascular/Lymphatic: No pathologically enlarged lymph nodes identified. No evidence of abdominal aortic aneurysm. Aortic atherosclerosis. Reproductive: Prior hysterectomy noted. Adnexal regions are unremarkable in appearance. Other: Small epigastric ventral hernia containing only fat, without significant change. Musculoskeletal: No suspicious bone lesions identified. Old fracture deformity of left inferior pubic ramus and renal osteodystrophy noted. IMPRESSION: No acute findings. Diffuse bilateral renal parenchymal atrophy  and calcification, consistent with end-stage renal disease. No evidence hydronephrosis. Colonic diverticulosis, without radiographic evidence of diverticulitis. Small epigastric ventral hernia containing only fat. Aortic atherosclerosis. Electronically Signed   By: Earle Gell M.D.   On: 12/17/2016 16:47     Medications:   . [MAR Hold] sodium chloride    . [MAR Hold] sodium chloride    . sodium chloride 20 mL/hr at 12/19/16 0837   . [MAR Hold] amLODipine  10 mg Oral Daily  . [MAR Hold] atorvastatin  40 mg Oral Daily  . [MAR Hold] carvedilol  6.25 mg Oral BID WC  . [MAR Hold] gabapentin  300 mg Oral QHS  . [MAR Hold] losartan  100 mg Oral Daily  . ondansetron      . [MAR Hold] pantoprazole (PROTONIX) IV  40 mg Intravenous Q12H  . [MAR Hold] sodium chloride flush  3 mL Intravenous Q12H  . [MAR Hold] spironolactone  25 mg Oral Daily  . [MAR Hold] traZODone  25 mg Oral QHS   [MAR Hold] sodium chloride, [MAR Hold] acetaminophen **OR** [MAR Hold] acetaminophen, [MAR Hold] ondansetron **OR** [MAR Hold] ondansetron (ZOFRAN) IV, [MAR Hold] sodium chloride flush  Assessment/ Plan:  71 y.o. female with ESRD on hemodialysis, GERD, anemia, atrial fibrillation, CVA, coronary artery disease, pulmonary hypertension, carotid artery stenosis  8/4- presents with severe anemia, GI bleed, chest pain.  Colonoscopy revealed mass at ileocecal valve, biopsy taken.   [place]   1. End Stage Renal Disease: on hemodialysis. MWF schedule. Patient due for hemodialysis today. Orders have been prepared.   2. Anemia of chronic kidney disease: with GI bleed. Hemoglobin 6.4 on admission.  Status post PRBC transfusion - Patient found to have massive ileocecal valve. As such we will hold Epogen going forward. Patient will require periodic transfusions.  3. Hypokalemia - Continue spironolactone.  4. Secondary Hyperparathyroidism:  - Consider restarting calcium acetate once she is  eating.  5.  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 Results pending. Hold Epogen as above.   LOS: 2 Cordae Mccarey 8/6/201811:21 AM  [address] [facility] [place], [place]

## 2016-12-19 NOTE — Care Management (Signed)
Faxed discharge summary and nephrology progress note to Elvera Bicker with [employer]

## 2016-12-19 NOTE — Progress Notes (Signed)
HD initiated via L AVF without issue. HR currently 46-54bpm. Patient alert, oriented. BP low. UF goal 0.5L as ordered. Continue to monitor closely

## 2016-12-19 NOTE — Transfer of Care (Signed)
Immediate Anesthesia Transfer of Care Note  Patient: Toni Carlson  Procedure(s) Performed: Procedure(s): COLONOSCOPY WITH PROPOFOL (N/A) ESOPHAGOGASTRODUODENOSCOPY (EGD) WITH PROPOFOL (N/A)  Patient Location: Endoscopy Unit  Anesthesia Type:General  Level of Consciousness: awake, oriented and patient cooperative  Airway & Oxygen Therapy: Patient Spontanous Breathing and Patient connected to nasal cannula oxygen  Post-op Assessment: Report given to RN, Post -op Vital signs reviewed and stable and Patient moving all extremities X 4  Post vital signs: Reviewed and stable  Last Vitals:  Vitals:   12/19/16 0806 12/19/16 0823  BP: (!) 141/55 (!) 184/63  Pulse: (!) 57 60  Resp: 16 20  Temp: 36.9 C 36.8 C    Last Pain:  Vitals:   12/19/16 0823  TempSrc: Tympanic  PainSc: 0-No pain         Complications: No apparent anesthesia complications

## 2016-12-19 NOTE — Anesthesia Preprocedure Evaluation (Signed)
Anesthesia Evaluation  Patient identified by MRN, date of birth, ID band Patient awake    Reviewed: Allergy & Precautions, H&P , NPO status , Patient's Chart, lab work & pertinent test results  History of Anesthesia Complications Negative for: history of anesthetic complications  Airway Mallampati: III  TM Distance: >3 FB Neck ROM: limited    Dental  (+) Poor Dentition, Chipped, Missing   Pulmonary COPD, former smoker,           Cardiovascular Exercise Tolerance: Poor hypertension, + CAD, + Peripheral Vascular Disease and +CHF       Neuro/Psych  Neuromuscular disease CVA, Residual Symptoms negative psych ROS   GI/Hepatic Neg liver ROS, PUD,   Endo/Other  negative endocrine ROS  Renal/GU Renal disease  negative genitourinary   Musculoskeletal  (+) Arthritis ,   Abdominal   Peds  Hematology negative hematology ROS (+)   Anesthesia Other Findings Past Medical History: No date: A-fib [facility])     Comment:  a. on warfarin No date: Anemia No date: Carotid artery stenosis     Comment:  a. ultrasound 03/2015: RICA 51-02% stenosis, LICA less               than 50% stenosis No date: Chronic diastolic CHF (congestive heart failure) (HCC)     Comment:  a. echo 03/2015: EF 60-65%, normal wall motion,               diastolic parameters were c/w restrictive physiology,               indicative of decreased LV diastolic compliance and/or               increased LA pressure, mild AS, mild MR, left atrium was               severely dilated, RA was severely dilated, PASP was               severely increased at 90 mm Hg No date: Coronary artery disease, non-occlusive     Comment:  a. cardiac cath 2013 No date: ESRD (end stage renal disease) (HCC)     Comment:  On Tue-Thur-Sat dialysis No date: H/O ischemic right MCA stroke     Comment:  a. 03/2015; b. residual left-sided facial droop and               slurred speech No date:  Hyperparathyroidism, secondary renal (HCC) No date: Hypertension No date: Nicotine dependence No date: Pulmonary HTN (HCC)     Comment:  as above No date: Symptomatic bradycardia     Comment:  a. history of symptomatic bradycardia; b. felt to be 2/2              metabolic abnormalities & required temp wire but no PPM,               resolved with HD  Past Surgical History: 10/25/2016: A/V FISTULAGRAM; Left     Comment:  Procedure: A/V Fistulagram;  Surgeon: Katha Cabal, MD;  Location: [place] CV LAB;  Service:               Cardiovascular;  Laterality: Left; 04/14/2015: ESOPHAGOGASTRODUODENOSCOPY (EGD) WITH PROPOFOL; N/A     Comment:  Procedure: ESOPHAGOGASTRODUODENOSCOPY (EGD) WITH               PROPOFOL;  Surgeon: Lucilla Lame, MD;  Location: [facility]  ENDOSCOPY;  Service: Endoscopy;  Laterality: N/A; 07/17/2015: ESOPHAGOGASTRODUODENOSCOPY (EGD) WITH PROPOFOL; N/A     Comment:  Procedure: ESOPHAGOGASTRODUODENOSCOPY (EGD) WITH               PROPOFOL;  Surgeon: Josefine Class, MD;  Location:               [facility] ENDOSCOPY;  Service: Endoscopy;  Laterality: N/A; 09/24/2015: EUS; N/A     Comment:  Procedure: UPPER ENDOSCOPIC ULTRASOUND (EUS) LINEAR;                Surgeon: Jola Schmidt, MD;  Location: [facility] ENDOSCOPY;                Service: Endoscopy;  Laterality: N/A; 06/09/2016: LOWER EXTREMITY ANGIOGRAPHY; Right     Comment:  Procedure: Lower Extremity Angiography;  Surgeon: Algernon Huxley, MD;  Location: [place] CV LAB;  Service:               Cardiovascular;  Laterality: Right; 02/23/2015: PERIPHERAL VASCULAR CATHETERIZATION; N/A     Comment:  Procedure: A/V Shuntogram/Fistulagram;  Surgeon: Algernon Huxley, MD;  Location: [place] CV LAB;  Service:               Cardiovascular;  Laterality: N/A; 02/23/2015: PERIPHERAL VASCULAR CATHETERIZATION; N/A     Comment:  Procedure: A/V Shunt Intervention;  Surgeon: Algernon Huxley, MD;  Location: [place] CV LAB;  Service:               Cardiovascular;  Laterality: N/A;  BMI    Body Mass Index:  24.14 kg/m      Reproductive/Obstetrics negative OB ROS                             Anesthesia Physical Anesthesia Plan  ASA: IV  Anesthesia Plan: General   Post-op Pain Management:    Induction: Intravenous  PONV Risk Score and Plan:   Airway Management Planned: Natural Airway and Nasal Cannula  Additional Equipment:   Intra-op Plan:   Post-operative Plan:   Informed Consent: I have reviewed the patients History and Physical, chart, labs and discussed the procedure including the risks, benefits and alternatives for the proposed anesthesia with the patient or authorized representative who has indicated his/her understanding and acceptance.   Dental Advisory Given  Plan Discussed with: Anesthesiologist, CRNA and Surgeon  Anesthesia Plan Comments: (Patient consented for risks of anesthesia including but not limited to:  - adverse reactions to medications - risk of intubation if required - damage to teeth, lips or other oral mucosa - sore throat or hoarseness - Damage to heart, brain, lungs or loss of life  Patient voiced understanding.)        Anesthesia Quick Evaluation

## 2016-12-19 NOTE — Discharge Summary (Signed)
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 A-fib (HCC)    a. on warfarin  . Anemia   . Carotid artery stenosis    a. ultrasound 03/2015: RICA 56-38% stenosis, LICA less than 93% stenosis  . Chronic diastolic CHF (congestive heart failure) ([place])    a. echo 03/2015: EF 60-65%, normal wall motion, diastolic parameters were c/w restrictive physiology, indicative of decreased LV diastolic compliance and/or increased LA pressure, mild AS, mild MR, left atrium was severely dilated, RA was severely dilated, PASP was severely increased at 90 mm Hg  . Coronary artery disease, non-occlusive    a. cardiac cath 2013  . ESRD (end stage renal disease) ([place])    On Tue-Thur-Sat dialysis  . H/O ischemic right MCA stroke    a. 03/2015; b. residual left-sided facial droop and slurred speech  . Hyperparathyroidism, secondary renal ([place])   . Hypertension   . Nicotine dependence   . Pulmonary HTN ([place])    as above  . Symptomatic bradycardia    a. history of symptomatic bradycardia; b. felt to be 2/2 metabolic abnormalities & required temp wire but no PPM, resolved with HD    HOSPITAL COURSE:  Toni Carlson a 71 y.o.femalewith multiple medical problems including CHF, ESRD (on HD MWF) admitted for chest pain and found to have  anemia with a Hgb 6.4. On Plavix and aspirin as an outpt. Denies black or red stools. Denies N/V/hematemesis. Denies abdominal pain. Heme positive in the ER.  1.Symptomatic anemia -Patient has history of anemia of chronic disease- -she has had EGD in 2017 which showed large polypoid mass that was negative for malignancy and normal duodenum and stomach. -Seen by GI.  -Colonoscopy showed gall-sized tumor solid at the ileocecal valve biopsies taken----patient to follow up outpatient with Dr. Macie Burows and further workup for colon mass as outpatient -Transfuse 2nd unit today -Came in with hemoglobin of 6.4----one unit of blood transfusion----7.4--1 more unit-- 9.1--- 9.7 -No active bleeding  2.Chest pain, mildly elevated troponin secondary to demand ischemia in the setting of anemia -Appears atypical. No EKG changes. -When necessary nitroglycerin  3.Hypokalemia -Replete  4.Coronary artery disease -Continue cardiac meds -Resumed aspirin and Plavix----patient not actively bleeding. This was discussed with GI and nephrology  5.End-stage renal disease on dialysis =-Nephrology consultation for in-house dialysis  6.Chronic diastolic heart failure--- stable. Patient is not in heart failure  Overall stable. We'll discharge to [facility]  Case discussed with Care Management/Social Worker. Management plans discussed with the patient, family and they are in agreement.  CODE STATUS: *Full*  DVT Prophylaxis: SCD  CONSULTS OBTAINED:  Treatment Team:  Murlean Iba, MD Jonathon Bellows, MD  DRUG ALLERGIES:  No Known Allergies  DISCHARGE MEDICATIONS:   Current Discharge Medication List    START taking these medications   Details  spironolactone (ALDACTONE) 25 MG tablet Take 1 tablet (  25 mg total) by mouth daily. Qty: 30 tablet, Refills: 1      CONTINUE these medications which have NOT CHANGED   Details  amLODipine (NORVASC) 10 MG tablet Take 1 tablet (10 mg  total) by mouth daily. Qty: 30 tablet, Refills: 0    aspirin EC 81 MG EC tablet Take 1 tablet (81 mg total) by mouth daily. Qty: 90 tablet, Refills: 3    atorvastatin (LIPITOR) 40 MG tablet Take 1 tablet (40 mg total) by mouth daily. Qty: 30 tablet, Refills: 0    carvedilol (COREG) 6.25 MG tablet Take 1 tablet (6.25 mg total) by mouth 2 (two) times daily with a meal. Qty: 60 tablet, Refills: 0    cetirizine (ZYRTEC) 10 MG tablet Take 10 mg by mouth daily.    gabapentin (NEURONTIN) 300 MG capsule Take 300 mg by mouth at bedtime.    losartan (COZAAR) 100 MG tablet Take 1 tablet (100 mg total) by mouth daily. Qty: 30 tablet, Refills: 0    traZODone (DESYREL) 50 MG tablet Take 25 mg by mouth at bedtime.     clopidogrel (PLAVIX) 75 MG tablet Take 1 tablet (75 mg total) by mouth daily. Qty: 30 tablet, Refills: 5    loperamide (IMODIUM) 2 MG capsule Take 2 mg by mouth as needed for diarrhea or loose stools.    ondansetron (ZOFRAN) 4 MG tablet Take 4 mg by mouth every 8 (eight) hours as needed for nausea or vomiting.    pantoprazole (PROTONIX) 40 MG tablet Take 1 tablet (40 mg total) by mouth 2 (two) times daily. Qty: 60 tablet, Refills: 0    polyethylene glycol (MIRALAX / GLYCOLAX) packet Take 17 g by mouth daily as needed for mild constipation. Qty: 14 each, Refills: 0    trolamine salicylate (ASPERCREME) 10 % cream Apply topically 2 (two) times daily as needed for muscle pain. Qty: 85 g, Refills: 0        If you experience worsening of your admission symptoms, develop shortness of breath, life threatening emergency, suicidal or homicidal thoughts you must seek medical attention immediately by calling 911 or calling your MD immediately  if symptoms less severe.  You Must read complete instructions/literature along with all the possible adverse reactions/side effects for all the Medicines you take and that have been prescribed to you. Take any new Medicines after you have  completely understood and accept all the possible adverse reactions/side effects.   Please note  You were cared for by a hospitalist during your hospital stay. If you have any questions about your discharge medications or the care you received while you were in the hospital after you are discharged, you can call the unit and asked to speak with the hospitalist on call if the hospitalist that took care of you is not available. Once you are discharged, your primary care physician will handle any further medical issues. Please note that NO REFILLS for any discharge medications will be authorized once you are discharged, as it is imperative that you return to your primary care physician (or establish a relationship with a primary care physician if you do not have one) for your aftercare needs so that they can reassess your need for medications and monitor your lab values. Today   SUBJECTIVE   Doing well. Hungry. Wants to eat.  VITAL SIGNS:  Blood pressure (!) 130/43, pulse (!) 57, temperature 97.6 F (36.4 C), temperature source Oral, resp. rate 16, height 5\' 7"  (1.702 m), weight 69.9 kg (154  lb), SpO2 98 %.  I/O:    Intake/Output Summary (Last 24 hours) at 12/19/16 1425 Last data filed at 12/19/16 1351  Gross per 24 hour  Intake              420 ml  Output                4 ml  Net              416 ml    PHYSICAL EXAMINATION:  GENERAL:  71 y.o.-year-old patient lying in the bed with no acute distress. Pallor+ EYES: Pupils equal, round, reactive to light and accommodation. No scleral icterus. Extraocular muscles intact.  HEENT: Head atraumatic, normocephalic. Oropharynx and nasopharynx clear.  NECK:  Supple, no jugular venous distention. No thyroid enlargement, no tenderness.  LUNGS: Normal breath sounds bilaterally, no wheezing, rales,rhonchi or crepitation. No use of accessory muscles of respiration.  CARDIOVASCULAR: S1, S2 normal. No murmurs, rubs, or gallops.  ABDOMEN: Soft,  non-tender, non-distended. Bowel sounds present. No organomegaly or mass.  EXTREMITIES: No pedal edema, cyanosis, or clubbing.  NEUROLOGIC: Cranial nerves II through XII are intact. Muscle strength 5/5 in all extremities. Sensation intact. Gait not checked.  PSYCHIATRIC: The patient is alert and oriented x 3.  SKIN: No obvious rash, lesion, or ulcer.   DATA REVIEW:   CBC   Recent Labs Lab 12/18/16 0544  12/19/16 0825  WBC 5.9  --   --   HGB 7.4*  < > 9.2*  HCT 22.6*  --  27.0*  PLT 174  --   --   < > = values in this interval not displayed.  Chemistries   Recent Labs Lab 12/17/16 0356 12/19/16 0825  NA 138 133*  K 3.1* 3.8  CL 98*  --   CO2 29  --   GLUCOSE 82 71  BUN 17  --   CREATININE 4.40*  --   CALCIUM 8.2*  --   AST 16  --   ALT 9*  --   ALKPHOS 568*  --   BILITOT 0.5  --     Microbiology Results   No results found for this or any previous visit (from the past 240 hour(s)).  RADIOLOGY:  Ct Abdomen Pelvis Wo Contrast  Result Date: 12/17/2016 CLINICAL DATA:  Abdominal pain. Symptomatic anemia. End-stage renal disease. EXAM: CT ABDOMEN AND PELVIS WITHOUT CONTRAST TECHNIQUE: Multidetector CT imaging of the abdomen and pelvis was performed following the standard protocol without IV contrast. COMPARISON:  04/12/2015 FINDINGS: Lower chest: No acute findings.  Cardiomegaly again noted. Hepatobiliary: No masses visualized on this unenhanced exam. Gallbladder is unremarkable. Pancreas: No mass or inflammatory process visualized on this unenhanced exam. Spleen:  Within normal limits in size. Adrenals/Urinary tract: Diffuse bilateral renal parenchymal atrophy and calcification seen, consistent with end-stage renal disease. No evidence of renal mass or hydronephrosis. Stomach/Bowel: No evidence of obstruction, inflammatory process, or abnormal fluid collections. Normal appendix visualized. Diverticulosis mainly involving the descending colon, without evidence of diverticulitis.  Vascular/Lymphatic: No pathologically enlarged lymph nodes identified. No evidence of abdominal aortic aneurysm. Aortic atherosclerosis. Reproductive: Prior hysterectomy noted. Adnexal regions are unremarkable in appearance. Other: Small epigastric ventral hernia containing only fat, without significant change. Musculoskeletal: No suspicious bone lesions identified. Old fracture deformity of left inferior pubic ramus and renal osteodystrophy noted. IMPRESSION: No acute findings. Diffuse bilateral renal parenchymal atrophy and calcification, consistent with end-stage renal disease. No evidence hydronephrosis. Colonic diverticulosis, without radiographic evidence of diverticulitis. Small epigastric  ventral hernia containing only fat. Aortic atherosclerosis. Electronically Signed   By: Earle Gell M.D.   On: 12/17/2016 16:47     Management plans discussed with the patient, family and they are in agreement.  CODE STATUS:     Code Status Orders        Start     Ordered   12/17/16 0631  Full code  Continuous     12/17/16 0630    Code Status History    Date Active Date Inactive Code Status Order ID Comments User Context   11/16/2016  8:51 PM 11/17/2016  3:31 PM Full Code [id number]  Fritzi Mandes, MD Inpatient   06/09/2016  6:39 PM 06/10/2016  6:35 PM Full Code [id number]  Algernon Huxley, MD Inpatient   07/15/2015  1:10 PM 07/17/2015  4:30 PM Full Code [id number]  Hillary Bow, MD ED   07/02/2015  5:23 PM 07/08/2015  8:09 PM Full Code [id number]  Gladstone Lighter, MD Inpatient   04/12/2015  1:03 PM 04/15/2015  6:00 PM DNR [id number]  Bettey Costa, MD Inpatient   03/26/2015  4:05 PM 04/01/2015  8:24 PM Full Code [id number]  Gladstone Lighter, MD Inpatient   02/23/2015  9:39 AM 02/23/2015  1:41 PM Full Code [id number]  Algernon Huxley, MD Inpatient      TOTAL TIME TAKING CARE OF THIS PATIENT: *40* minutes.    Quanita Barona M.D on 12/19/2016 at 2:25 PM  Between 7am to 6pm - Pager - [phone] After 6pm go to  [email or web address] - password [email or web address] [facility]  Office  [phone]  CC: Primary care physician; Murlean Iba, MD

## 2016-12-19 NOTE — Anesthesia Postprocedure Evaluation (Signed)
Anesthesia Post Note  Patient: Toni Carlson  Procedure(s) Performed: Procedure(s) (LRB): COLONOSCOPY WITH PROPOFOL (N/A) ESOPHAGOGASTRODUODENOSCOPY (EGD) WITH PROPOFOL (N/A)  Patient location during evaluation: Endoscopy Anesthesia Type: General Level of consciousness: awake and alert Pain management: pain level controlled Vital Signs Assessment: post-procedure vital signs reviewed and stable Respiratory status: spontaneous breathing, nonlabored ventilation, respiratory function stable and patient connected to nasal cannula oxygen Cardiovascular status: blood pressure returned to baseline and stable Postop Assessment: no signs of nausea or vomiting Anesthetic complications: no     Last Vitals:  Vitals:   12/19/16 1159 12/19/16 1159  BP: (!) 130/43   Pulse: (!) 55 (!) 57  Resp: 16   Temp: 36.4 C     Last Pain:  Vitals:   12/19/16 1159  TempSrc: Oral  PainSc:                  Precious Haws Chozen [place]ippe

## 2016-12-19 NOTE — Discharge Instructions (Signed)
Patient recommended to follow up her colon biopsy results with nephrology and further workup as outpatient.

## 2016-12-20 ENCOUNTER — Ambulatory Visit: Payer: Medicare Other | Admitting: Gastroenterology

## 2016-12-20 ENCOUNTER — Encounter: Payer: Self-pay | Admitting: Gastroenterology

## 2016-12-20 LAB — SURGICAL PATHOLOGY

## 2016-12-22 ENCOUNTER — Telehealth: Payer: Self-pay

## 2016-12-22 NOTE — Telephone Encounter (Signed)
Spoke to Nurse at [facility] and gave report per Dr. Georgeann Oppenheim result notes.  Nurse has scheduled a follow-up visit with Dr. Vicente Males.

## 2016-12-22 NOTE — Telephone Encounter (Signed)
-----   Message from Jonathon Bellows, MD sent at 12/21/2016 11:53 AM EDT ----- Dahlia Byes please inform patient that bx show ischemic colitis. I did see a large mass in the cecum, suggest op follow up with me . Options include repeat CT scan vs colonosocpy to see if the area of abnormality still persists. Then may need surgery   C/c Dr Candiss Norse

## 2016-12-29 ENCOUNTER — Encounter: Payer: Self-pay | Admitting: Gastroenterology

## 2016-12-29 ENCOUNTER — Ambulatory Visit (INDEPENDENT_AMBULATORY_CARE_PROVIDER_SITE_OTHER): Payer: Medicare Other | Admitting: Gastroenterology

## 2016-12-29 VITALS — BP 132/79 | HR 54 | Temp 98.4°F | Ht 66.0 in | Wt 152.0 lb

## 2016-12-29 DIAGNOSIS — D500 Iron deficiency anemia secondary to blood loss (chronic): Secondary | ICD-10-CM | POA: Diagnosis not present

## 2016-12-29 DIAGNOSIS — I70209 Unspecified atherosclerosis of native arteries of extremities, unspecified extremity: Secondary | ICD-10-CM

## 2016-12-29 NOTE — Progress Notes (Signed)
Primary Care Physician: Murlean Iba, MD  Primary Gastroenterologist:  Dr. Lucilla Lame  No chief complaint on file.   HPI: Toni Carlson is a 71 y.o. female here for follow-up after having a colonoscopy by Dr. Vicente Males. The patient had a lesion seen on the ileocecal valve that was removed with snare cautery. The patient now comes for follow-up of that procedure. The patient's pathology showed it to be an ischemic lesion without any cancer or precancerous tissue. The patient had a hemoglobin 11 days ago that showed a hemoglobin of 7.4 which the next day was 9.1. The patient's upper endoscopy was unremarkable.  Current Outpatient Prescriptions  Medication Sig Dispense Refill  . alendronate (FOSAMAX) 10 MG tablet Take by mouth.    Marland Kitchen amLODipine (NORVASC) 10 MG tablet Take 1 tablet (10 mg total) by mouth daily. 30 tablet 0  . aspirin EC 81 MG EC tablet Take 1 tablet (81 mg total) by mouth daily. 90 tablet 3  . atorvastatin (LIPITOR) 40 MG tablet Take 1 tablet (40 mg total) by mouth daily. 30 tablet 0  . carvedilol (COREG) 6.25 MG tablet Take 1 tablet (6.25 mg total) by mouth 2 (two) times daily with a meal. (Patient taking differently: Take 3.125 mg by mouth at bedtime. ) 60 tablet 0  . cetirizine (ZYRTEC) 10 MG tablet Take 10 mg by mouth daily.    . clopidogrel (PLAVIX) 75 MG tablet Take 1 tablet (75 mg total) by mouth daily. 30 tablet 5  . diclofenac sodium (VOLTAREN) 1 % GEL Apply topically.    . gabapentin (NEURONTIN) 300 MG capsule Take 300 mg by mouth at bedtime.    Marland Kitchen loperamide (IMODIUM) 2 MG capsule Take 2 mg by mouth as needed for diarrhea or loose stools.    Marland Kitchen losartan (COZAAR) 100 MG tablet Take 1 tablet (100 mg total) by mouth daily. 30 tablet 0  . omeprazole (PRILOSEC) 40 MG capsule Take by mouth.    . ondansetron (ZOFRAN) 4 MG tablet Take 4 mg by mouth every 8 (eight) hours as needed for nausea or vomiting.    . pantoprazole (PROTONIX) 40 MG tablet Take 1 tablet (40 mg total)  by mouth 2 (two) times daily. (Patient taking differently: Take 40 mg by mouth daily. ) 60 tablet 0  . polyethylene glycol (MIRALAX / GLYCOLAX) packet Take 17 g by mouth daily as needed for mild constipation. 14 each 0  . spironolactone (ALDACTONE) 25 MG tablet Take 1 tablet (25 mg total) by mouth daily. 30 tablet 1  . sucralfate (CARAFATE) 1 GM/10ML suspension Take by mouth.    . traZODone (DESYREL) 50 MG tablet Take 25 mg by mouth at bedtime.     . trolamine salicylate (ASPERCREME) 10 % cream Apply topically 2 (two) times daily as needed for muscle pain. 85 g 0   No current facility-administered medications for this visit.     Allergies as of 12/29/2016  . (No Known Allergies)    ROS:  General: Negative for anorexia, weight loss, fever, chills, fatigue, weakness. ENT: Negative for hoarseness, difficulty swallowing , nasal congestion. CV: Negative for chest pain, angina, palpitations, dyspnea on exertion, peripheral edema.  Respiratory: Negative for dyspnea at rest, dyspnea on exertion, cough, sputum, wheezing.  GI: See history of present illness. GU:  Negative for dysuria, hematuria, urinary incontinence, urinary frequency, nocturnal urination.  Endo: Negative for unusual weight change.    Physical Examination:   LMP  (LMP Unknown)   General: Well-nourished, well-developed  in no acute distress.  Eyes: No icterus. Conjunctivae pink. Mouth: Oropharyngeal mucosa moist and pink , no lesions erythema or exudate. Lungs: Clear to auscultation bilaterally. Non-labored. Heart: Regular rate and rhythm, no murmurs rubs or gallops.  Abdomen: Bowel sounds are normal, nontender, nondistended, no hepatosplenomegaly or masses, no abdominal bruits or hernia , no rebound or guarding.   Extremities: No lower extremity edema. No clubbing or deformities. Neuro: Alert and oriented x 3.  Grossly intact. Skin: Warm and dry, no jaundice.   Psych: Alert and cooperative, normal mood and affect.  Labs:      Imaging Studies: Ct Abdomen Pelvis Wo Contrast  Result Date: 12/17/2016 CLINICAL DATA:  Abdominal pain. Symptomatic anemia. End-stage renal disease. EXAM: CT ABDOMEN AND PELVIS WITHOUT CONTRAST TECHNIQUE: Multidetector CT imaging of the abdomen and pelvis was performed following the standard protocol without IV contrast. COMPARISON:  04/12/2015 FINDINGS: Lower chest: No acute findings.  Cardiomegaly again noted. Hepatobiliary: No masses visualized on this unenhanced exam. Gallbladder is unremarkable. Pancreas: No mass or inflammatory process visualized on this unenhanced exam. Spleen:  Within normal limits in size. Adrenals/Urinary tract: Diffuse bilateral renal parenchymal atrophy and calcification seen, consistent with end-stage renal disease. No evidence of renal mass or hydronephrosis. Stomach/Bowel: No evidence of obstruction, inflammatory process, or abnormal fluid collections. Normal appendix visualized. Diverticulosis mainly involving the descending colon, without evidence of diverticulitis. Vascular/Lymphatic: No pathologically enlarged lymph nodes identified. No evidence of abdominal aortic aneurysm. Aortic atherosclerosis. Reproductive: Prior hysterectomy noted. Adnexal regions are unremarkable in appearance. Other: Small epigastric ventral hernia containing only fat, without significant change. Musculoskeletal: No suspicious bone lesions identified. Old fracture deformity of left inferior pubic ramus and renal osteodystrophy noted. IMPRESSION: No acute findings. Diffuse bilateral renal parenchymal atrophy and calcification, consistent with end-stage renal disease. No evidence hydronephrosis. Colonic diverticulosis, without radiographic evidence of diverticulitis. Small epigastric ventral hernia containing only fat. Aortic atherosclerosis. Electronically Signed   By: Earle Gell M.D.   On: 12/17/2016 16:47   Dg Chest Port 1 View  Result Date: 12/17/2016 CLINICAL DATA:  Acute onset of central  chest pain and rhonchi. Initial encounter. EXAM: PORTABLE CHEST 1 VIEW COMPARISON:  Chest radiograph performed 09/19/2015 FINDINGS: The lungs are well-aerated. Increased interstitial markings raise concern for pulmonary edema, though pneumonia could have a similar appearance. There is no evidence of pleural effusion or pneumothorax. The cardiomediastinal silhouette is enlarged. No acute osseous abnormalities are seen. A vascular stent is noted overlying the left axilla. IMPRESSION: Increased interstitial markings raise concern for pulmonary edema, though pneumonia could have a similar appearance. Underlying cardiomegaly noted. Electronically Signed   By: Garald Balding M.D.   On: 12/17/2016 04:54    Assessment and Plan:   Toni Carlson is a 71 y.o. y/o female who had a lesion in the ileocecal valve that was removed by Dr. Vicente Males. The patient now comes for follow-up to review the pathology results the pathology showed no neoplasm images showed ischemic tissue. The patient's hemoglobin should be continued to be followed. She reports that she has her blood checked at dialysis which will be tomorrow.    Lucilla Lame, MD. Marval Regal   Note: This dictation was prepared with Dragon dictation along with smaller phrase technology. Any transcriptional errors that result from this process are unintentional.

## 2017-01-19 ENCOUNTER — Inpatient Hospital Stay
Admission: EM | Admit: 2017-01-19 | Discharge: 2017-01-21 | DRG: 377 | Disposition: A | Discharge status: Home / Self Care | Payer: Medicare Other | Attending: Internal Medicine | Admitting: Internal Medicine

## 2017-01-19 ENCOUNTER — Emergency Department: Payer: Medicare Other

## 2017-01-19 ENCOUNTER — Encounter: Payer: Self-pay | Admitting: Emergency Medicine

## 2017-01-19 DIAGNOSIS — K921 Melena: Secondary | ICD-10-CM | POA: Diagnosis not present

## 2017-01-19 DIAGNOSIS — R1084 Generalized abdominal pain: Secondary | ICD-10-CM | POA: Diagnosis present

## 2017-01-19 DIAGNOSIS — I10 Essential (primary) hypertension: Secondary | ICD-10-CM | POA: Diagnosis present

## 2017-01-19 DIAGNOSIS — I7090 Unspecified atherosclerosis: Secondary | ICD-10-CM

## 2017-01-19 DIAGNOSIS — I70209 Unspecified atherosclerosis of native arteries of extremities, unspecified extremity: Secondary | ICD-10-CM | POA: Diagnosis present

## 2017-01-19 DIAGNOSIS — Z8673 Personal history of transient ischemic attack (TIA), and cerebral infarction without residual deficits: Secondary | ICD-10-CM

## 2017-01-19 DIAGNOSIS — N186 End stage renal disease: Secondary | ICD-10-CM | POA: Diagnosis present

## 2017-01-19 DIAGNOSIS — N2581 Secondary hyperparathyroidism of renal origin: Secondary | ICD-10-CM | POA: Diagnosis present

## 2017-01-19 DIAGNOSIS — I4891 Unspecified atrial fibrillation: Secondary | ICD-10-CM | POA: Diagnosis present

## 2017-01-19 DIAGNOSIS — I132 Hypertensive heart and chronic kidney disease with heart failure and with stage 5 chronic kidney disease, or end stage renal disease: Secondary | ICD-10-CM | POA: Diagnosis present

## 2017-01-19 DIAGNOSIS — Z87891 Personal history of nicotine dependence: Secondary | ICD-10-CM | POA: Diagnosis not present

## 2017-01-19 DIAGNOSIS — I5032 Chronic diastolic (congestive) heart failure: Secondary | ICD-10-CM | POA: Diagnosis present

## 2017-01-19 DIAGNOSIS — Z8249 Family history of ischemic heart disease and other diseases of the circulatory system: Secondary | ICD-10-CM | POA: Diagnosis not present

## 2017-01-19 DIAGNOSIS — D631 Anemia in chronic kidney disease: Secondary | ICD-10-CM | POA: Diagnosis present

## 2017-01-19 DIAGNOSIS — Z7982 Long term (current) use of aspirin: Secondary | ICD-10-CM

## 2017-01-19 DIAGNOSIS — Z79899 Other long term (current) drug therapy: Secondary | ICD-10-CM

## 2017-01-19 DIAGNOSIS — I2510 Atherosclerotic heart disease of native coronary artery without angina pectoris: Secondary | ICD-10-CM | POA: Diagnosis present

## 2017-01-19 DIAGNOSIS — I272 Pulmonary hypertension, unspecified: Secondary | ICD-10-CM | POA: Diagnosis present

## 2017-01-19 DIAGNOSIS — K922 Gastrointestinal hemorrhage, unspecified: Secondary | ICD-10-CM | POA: Diagnosis present

## 2017-01-19 DIAGNOSIS — Z7902 Long term (current) use of antithrombotics/antiplatelets: Secondary | ICD-10-CM | POA: Diagnosis not present

## 2017-01-19 DIAGNOSIS — Z992 Dependence on renal dialysis: Secondary | ICD-10-CM | POA: Diagnosis not present

## 2017-01-19 LAB — LIPASE, BLOOD: Lipase: 23 U/L (ref 11–51)

## 2017-01-19 LAB — COMPREHENSIVE METABOLIC PANEL
ALT: 10 U/L — AB (ref 14–54)
AST: 16 U/L (ref 15–41)
Albumin: 3.3 g/dL — ABNORMAL LOW (ref 3.5–5.0)
Alkaline Phosphatase: 537 U/L — ABNORMAL HIGH (ref 38–126)
Anion gap: 12 (ref 5–15)
BILIRUBIN TOTAL: 0.4 mg/dL (ref 0.3–1.2)
BUN: 37 mg/dL — AB (ref 6–20)
CO2: 30 mmol/L (ref 22–32)
CREATININE: 5.57 mg/dL — AB (ref 0.44–1.00)
Calcium: 7.7 mg/dL — ABNORMAL LOW (ref 8.9–10.3)
Chloride: 96 mmol/L — ABNORMAL LOW (ref 101–111)
GFR calc Af Amer: 8 mL/min — ABNORMAL LOW (ref >60)
GFR, EST NON AFRICAN AMERICAN: 7 mL/min — AB (ref >60)
Glucose, Bld: 111 mg/dL — ABNORMAL HIGH (ref 65–99)
POTASSIUM: 3.9 mmol/L (ref 3.5–5.1)
Sodium: 138 mmol/L (ref 135–145)
TOTAL PROTEIN: 6.2 g/dL — AB (ref 6.5–8.1)

## 2017-01-19 LAB — CBC WITH DIFFERENTIAL/PLATELET
BASOS ABS: 0.1 10*3/uL (ref 0–0.1)
Basophils Relative: 1 %
Eosinophils Absolute: 0.1 10*3/uL (ref 0–0.7)
Eosinophils Relative: 2 %
HCT: 18.4 % — ABNORMAL LOW (ref 35.0–47.0)
HEMOGLOBIN: 6 g/dL — AB (ref 12.0–16.0)
LYMPHS ABS: 0.8 10*3/uL — AB (ref 1.0–3.6)
LYMPHS PCT: 14 %
MCH: 31.1 pg (ref 26.0–34.0)
MCHC: 32.7 g/dL (ref 32.0–36.0)
MCV: 94.9 fL (ref 80.0–100.0)
Monocytes Absolute: 0.5 10*3/uL (ref 0.2–0.9)
Monocytes Relative: 8 %
NEUTROS ABS: 4.3 10*3/uL (ref 1.4–6.5)
NEUTROS PCT: 75 %
Platelets: 166 10*3/uL (ref 150–440)
RBC: 1.94 MIL/uL — AB (ref 3.80–5.20)
RDW: 20.6 % — ABNORMAL HIGH (ref 11.5–14.5)
WBC: 5.8 10*3/uL (ref 3.6–11.0)

## 2017-01-19 LAB — PREPARE RBC (CROSSMATCH)

## 2017-01-19 MED ORDER — CALCIUM ACETATE (PHOS BINDER) 667 MG PO CAPS
1334.0000 mg | ORAL_CAPSULE | Freq: Three times a day (TID) | ORAL | Status: DC
  Administered 2017-01-20 – 2017-01-21 (×3): 1334 mg via ORAL
  Filled 2017-01-19 (×3): qty 2

## 2017-01-19 MED ORDER — ACETAMINOPHEN 650 MG RE SUPP: 650.0000 mg | Freq: Four times a day (QID) | RECTAL | Status: DC | PRN

## 2017-01-19 MED ORDER — ACETAMINOPHEN 325 MG PO TABS: 650.0000 mg | ORAL_TABLET | Freq: Four times a day (QID) | ORAL | Status: DC | PRN

## 2017-01-19 MED ORDER — ONDANSETRON HCL 4 MG PO TABS: 4.0000 mg | ORAL_TABLET | Freq: Four times a day (QID) | ORAL | Status: DC | PRN

## 2017-01-19 MED ORDER — ONDANSETRON HCL 4 MG/2ML IJ SOLN: 4.0000 mg | Freq: Four times a day (QID) | INTRAMUSCULAR | Status: DC | PRN

## 2017-01-19 MED ORDER — CLOPIDOGREL BISULFATE 75 MG PO TABS
75.0000 mg | ORAL_TABLET | Freq: Every day | ORAL | Status: DC
  Administered 2017-01-20: 75 mg via ORAL
  Filled 2017-01-19: qty 1

## 2017-01-19 MED ORDER — PANTOPRAZOLE SODIUM 40 MG IV SOLR: 40.0000 mg | Freq: Two times a day (BID) | INTRAVENOUS | Status: DC

## 2017-01-19 MED ORDER — SODIUM CHLORIDE 0.9 % IV SOLN: INTRAVENOUS | Status: DC

## 2017-01-19 MED ORDER — SODIUM CHLORIDE 0.9 % IV SOLN
80.0000 mg | Freq: Once | INTRAVENOUS | Status: AC
  Administered 2017-01-19: 23:00:00 80 mg via INTRAVENOUS
  Filled 2017-01-19: qty 80

## 2017-01-19 MED ORDER — ASPIRIN EC 81 MG PO TBEC
81.0000 mg | DELAYED_RELEASE_TABLET | Freq: Every day | ORAL | Status: DC
  Administered 2017-01-20: 81 mg via ORAL
  Filled 2017-01-19: qty 1

## 2017-01-19 MED ORDER — ONDANSETRON HCL 4 MG/2ML IJ SOLN
4.0000 mg | Freq: Once | INTRAMUSCULAR | Status: AC
Start: 2017-01-19 — End: 2017-01-19
  Administered 2017-01-19: 4 mg via INTRAVENOUS
  Filled 2017-01-19: qty 2

## 2017-01-19 MED ORDER — MORPHINE SULFATE (PF) 4 MG/ML IV SOLN
4.0000 mg | Freq: Once | INTRAVENOUS | Status: AC
  Administered 2017-01-19: 4 mg via INTRAVENOUS
  Filled 2017-01-19: qty 1

## 2017-01-19 MED ORDER — SODIUM CHLORIDE 0.9 % IV SOLN
10.0000 mL/h | Freq: Once | INTRAVENOUS | Status: AC
  Administered 2017-01-20: 10 mL/h via INTRAVENOUS

## 2017-01-19 MED ORDER — IOPAMIDOL (ISOVUE-300) INJECTION 61%
100.0000 mL | Freq: Once | INTRAVENOUS | Status: AC | PRN
  Administered 2017-01-19: 100 mL via INTRAVENOUS

## 2017-01-19 MED ORDER — SODIUM CHLORIDE 0.9 % IV SOLN
8.0000 mg/h | INTRAVENOUS | Status: DC
  Administered 2017-01-19 – 2017-01-21 (×4): 8 mg/h via INTRAVENOUS
  Filled 2017-01-19 (×4): qty 80

## 2017-01-19 MED ORDER — ESCITALOPRAM OXALATE 10 MG PO TABS
10.0000 mg | ORAL_TABLET | Freq: Every day | ORAL | Status: DC
  Administered 2017-01-20 – 2017-01-21 (×2): 10 mg via ORAL
  Filled 2017-01-19 (×2): qty 1

## 2017-01-19 NOTE — ED Triage Notes (Signed)
Pt in via [facility] from [facility] with complaints of generalized abdominal pain and nausea/vomiting since yesterday after dialysis.  NAD noted at this time.

## 2017-01-19 NOTE — ED Provider Notes (Signed)
[facility] Emergency Department Provider Note  ____________________________________________  Time seen: Approximately 8:07 PM  I have reviewed the triage vital signs and the nursing notes.   HISTORY  Chief Complaint Abdominal Pain   HPI Toni Carlson is a 71 y.o. female with a history of ESRD on HD (MWF), CAD, CHF, CVA, HTN, diverticulosis who presents for evaluation of abdominal pain. Patient reports that the pain started yesterday after dialysis, it is dull, constant, located in the center and left side of her abdomen and nonradiating. She has had nausea and 2 episodes of nonbloody nonbilious emesis, also 2 episodes of watery diarrhea. No melena, no hematemesis, no bright red blood per rectum. No dysuria. No fever. Patient denies any prior abdominal surgeries.   Past Medical History:  Diagnosis Date  . A-fib (HCC)    a. on warfarin  . Anemia   . Carotid artery stenosis    a. ultrasound 03/2015: RICA 48-18% stenosis, LICA less than 56% stenosis  . Chronic diastolic CHF (congestive heart failure) ([place])    a. echo 03/2015: EF 60-65%, normal wall motion, diastolic parameters were c/w restrictive physiology, indicative of decreased LV diastolic compliance and/or increased LA pressure, mild AS, mild MR, left atrium was severely dilated, RA was severely dilated, PASP was severely increased at 90 mm Hg  . Coronary artery disease, non-occlusive    a. cardiac cath 2013  . ESRD (end stage renal disease) ([place])    On Tue-Thur-Sat dialysis  . H/O ischemic right MCA stroke    a. 03/2015; b. residual left-sided facial droop and slurred speech  . Hyperparathyroidism, secondary renal ([place])   . Hypertension   . Nicotine dependence   . Pulmonary HTN ([place])    as above  . Symptomatic bradycardia    a. history of symptomatic bradycardia; b. felt to be 2/2 metabolic abnormalities & required temp wire but no PPM, resolved with HD    Patient Active Problem List   Diagnosis Date Noted  . Complication from renal dialysis device 11/27/2016  . Atherosclerosis of artery of extremity with rest pain ([place]) 06/09/2016  . Atherosclerosis of native arteries of extremity with rest pain ([place]) 05/24/2016  . Gastric polyp 08/18/2015  . Anemia 07/15/2015  . GI bleed 07/15/2015  . Polyneuropathy 07/08/2015  . Degenerative disc disease, lumbar 07/08/2015  . Pancytopenia ([place]) 07/08/2015  . Generalized weakness 07/08/2015  . End stage renal disease on dialysis ([place]) 07/08/2015  . Atherosclerotic peripheral vascular disease ([place]) 07/08/2015  . Pain in both feet 07/02/2015  . Gastric ulceration   . Acute GI bleeding   . GIB (gastrointestinal bleeding) 04/12/2015  . Left-sided weakness 04/01/2015  . Essential hypertension 04/01/2015  . Anemia of chronic disease 04/01/2015  . Elevated troponin 04/01/2015  . Pulmonary hypertension ([place]) 04/01/2015  . Atrial fibrillation ([place])   . CVA (cerebral infarction) 03/26/2015    Past Surgical History:  Procedure Laterality Date  . A/V FISTULAGRAM Left 10/25/2016   Procedure: A/V Fistulagram;  Surgeon: Katha Cabal, MD;  Location: [place] CV LAB;  Service: Cardiovascular;  Laterality: Left;  . COLONOSCOPY WITH PROPOFOL N/A 12/19/2016   Procedure: COLONOSCOPY WITH PROPOFOL;  Surgeon: Jonathon Bellows, MD;  Location: [facility] ENDOSCOPY;  Service: Gastroenterology;  Laterality: N/A;  . ESOPHAGOGASTRODUODENOSCOPY (EGD) WITH PROPOFOL N/A 04/14/2015   Procedure: ESOPHAGOGASTRODUODENOSCOPY (EGD) WITH PROPOFOL;  Surgeon: Lucilla Lame, MD;  Location: [facility] ENDOSCOPY;  Service: Endoscopy;  Laterality: N/A;  . ESOPHAGOGASTRODUODENOSCOPY (EGD) WITH PROPOFOL N/A 07/17/2015   Procedure:  ESOPHAGOGASTRODUODENOSCOPY (EGD) WITH PROPOFOL;  Surgeon: Josefine Class, MD;  Location: [facility] ENDOSCOPY;  Service: Endoscopy;  Laterality: N/A;  . ESOPHAGOGASTRODUODENOSCOPY (EGD) WITH PROPOFOL N/A 12/19/2016   Procedure: ESOPHAGOGASTRODUODENOSCOPY (EGD)  WITH PROPOFOL;  Surgeon: Jonathon Bellows, MD;  Location: [facility] ENDOSCOPY;  Service: Gastroenterology;  Laterality: N/A;  . EUS N/A 09/24/2015   Procedure: UPPER ENDOSCOPIC ULTRASOUND (EUS) LINEAR;  Surgeon: Jola Schmidt, MD;  Location: [facility] ENDOSCOPY;  Service: Endoscopy;  Laterality: N/A;  . LOWER EXTREMITY ANGIOGRAPHY Right 06/09/2016   Procedure: Lower Extremity Angiography;  Surgeon: Algernon Huxley, MD;  Location: [place] CV LAB;  Service: Cardiovascular;  Laterality: Right;  . PERIPHERAL VASCULAR CATHETERIZATION N/A 02/23/2015   Procedure: A/V Shuntogram/Fistulagram;  Surgeon: Algernon Huxley, MD;  Location: [place] [facility] LAB;  Service: Cardiovascular;  Laterality: N/A;  . PERIPHERAL VASCULAR CATHETERIZATION N/A 02/23/2015   Procedure: A/V Shunt Intervention;  Surgeon: Algernon Huxley, MD;  Location: [place] [facility] LAB;  Service: Cardiovascular;  Laterality: N/A;    Prior to Admission medications   Medication Sig Start Date End Date Taking? Authorizing Provider  acetaminophen (TYLENOL) 500 MG tablet Take 500 mg by mouth every 4 (four) hours as needed for fever.   Yes [provider]  alum & mag hydroxide-simeth (MAALOX PLUS) 400-400-40 MG/5ML suspension Take 30 mLs by mouth as needed for indigestion.   Yes [provider]  amLODipine (NORVASC) 10 MG tablet Take 1 tablet (10 mg total) by mouth daily. Patient taking differently: Take 10 mg by mouth at bedtime.  09/19/15  Yes Carrie Mew, MD  aspirin EC 81 MG EC tablet Take 1 tablet (81 mg total) by mouth daily. 06/10/16  Yes Dew, Erskine Squibb, MD  atorvastatin (LIPITOR) 40 MG tablet Take 1 tablet (40 mg total) by mouth daily. Patient taking differently: Take 40 mg by mouth at bedtime.  09/19/15  Yes Carrie Mew, MD  calcium acetate (PHOSLO) 667 MG capsule Take 1,334 mg by mouth 3 (three) times daily.   Yes [provider]  carvedilol (COREG) 6.25 MG tablet Take 1 tablet (6.25 mg total) by mouth 2 (two) times daily with a  meal. 09/19/15  Yes Carrie Mew, MD  cetirizine (ZYRTEC) 10 MG tablet Take 10 mg by mouth daily.   Yes [provider]  clopidogrel (PLAVIX) 75 MG tablet Take 1 tablet (75 mg total) by mouth daily. 06/10/16  Yes Algernon Huxley, MD  escitalopram (LEXAPRO) 10 MG tablet Take 10 mg by mouth daily.   Yes [provider]  gabapentin (NEURONTIN) 300 MG capsule Take 300 mg by mouth at bedtime.   Yes [provider]  guaifenesin (ROBITUSSIN) 100 MG/5ML syrup Take 200 mg by mouth every 6 (six) hours as needed for cough.   Yes [provider]  loperamide (IMODIUM) 2 MG capsule Take 2 mg by mouth as needed for diarrhea or loose stools.   Yes [provider]  losartan (COZAAR) 100 MG tablet Take 1 tablet (100 mg total) by mouth daily. Patient taking differently: Take 100 mg by mouth at bedtime.  09/19/15  Yes Carrie Mew, MD  magnesium hydroxide (MILK OF MAGNESIA) 400 MG/5ML suspension Take 30 mLs by mouth at bedtime as needed for mild constipation.   Yes [provider]  neomycin-bacitracin-polymyxin (NEOSPORIN) ointment Apply 1 application topically as needed for wound care. apply to eye   Yes [provider]  ondansetron (ZOFRAN) 4 MG tablet Take 4 mg by mouth every 8 (eight) hours as needed  for nausea or vomiting.   Yes [provider]  pantoprazole (PROTONIX) 40 MG tablet Take 1 tablet (40 mg total) by mouth 2 (two) times daily. 09/19/15  Yes Carrie Mew, MD  polyethylene glycol [facility] / Floria Raveling) packet Take 17 g by mouth daily as needed for mild constipation. 09/19/15  Yes Carrie Mew, MD  spironolactone (ALDACTONE) 25 MG tablet Take 1 tablet (25 mg total) by mouth daily. 12/20/16  Yes Fritzi Mandes, MD  traZODone (DESYREL) 50 MG tablet Take 25 mg by mouth at bedtime.  05/18/16  Yes [provider]  trolamine salicylate (ASPERCREME) 10 % cream Apply topically 2 (two) times daily as needed for muscle pain. 09/19/15  Yes  Carrie Mew, MD    Allergies Patient has no known allergies.  Family History  Problem Relation Age of Onset  . CAD Father   . Dementia Mother   . Lupus Sister     Social History Social History  Substance Use Topics  . Smoking status: Former Smoker    Packs/day: 0.25    Years: 30.00    Types: Cigarettes    Quit date: 11/20/2013  . Smokeless tobacco: Never Used  . Alcohol use No    Review of Systems  Constitutional: Negative for fever. + chills Eyes: Negative for visual changes. ENT: Negative for sore throat. Neck: No neck pain  Cardiovascular: Negative for chest pain. Respiratory: Negative for shortness of breath. Gastrointestinal: + abdominal pain, nausea, vomiting and diarrhea. Genitourinary: Negative for dysuria. Musculoskeletal: Negative for back pain. Skin: Negative for rash. Neurological: Negative for headaches, weakness or numbness. Psych: No SI or HI  ____________________________________________   PHYSICAL EXAM:  VITAL SIGNS: Vitals:   01/19/17 2009 01/19/17 2200  BP: (!) 117/52 138/64  Pulse: (!) 45 (!) 50  Resp: 16 14  Temp: 98.9 F (37.2 C)   SpO2: 96% 99%    Constitutional: Alert and oriented. Well appearing and in no apparent distress. HEENT:      Head: Normocephalic and atraumatic.         Eyes: Conjunctivae are normal. Sclera is non-icteric.       Mouth/Throat: Mucous membranes are moist.       Neck: Supple with no signs of meningismus. Cardiovascular: Regular rate and rhythm. No murmurs, gallops, or rubs. 2+ symmetrical distal pulses are present in all extremities. No JVD. Respiratory: Normal respiratory effort. Lungs are clear to auscultation bilaterally. No wheezes, crackles, or rhonchi.  Gastrointestinal: Soft, ttp over the L quadrants and periumbilical region, and non distended with positive bowel sounds. No rebound or guarding. Musculoskeletal: Nontender with normal range of motion in all extremities. No edema, cyanosis, or  erythema of extremities. Neurologic: Normal speech and language. Face is symmetric. Moving all extremities. No gross focal neurologic deficits are appreciated. Skin: Skin is warm, dry and intact. No rash noted. Psychiatric: Mood and affect are normal. Speech and behavior are normal.  ____________________________________________   LABS (all labs ordered are listed, but only abnormal results are displayed)  Labs Reviewed  CBC WITH DIFFERENTIAL/PLATELET - Abnormal; Notable for the following:       Result Value   RBC 1.94 (*)    Hemoglobin 6.0 (*)    HCT 18.4 (*)    RDW 20.6 (*)    Lymphs Abs 0.8 (*)    All other components within normal limits  COMPREHENSIVE METABOLIC PANEL - Abnormal; Notable for the following:    Chloride 96 (*)    Glucose, Bld 111 (*)  BUN 37 (*)    Creatinine, Ser 5.57 (*)    Calcium 7.7 (*)    Total Protein 6.2 (*)    Albumin 3.3 (*)    ALT 10 (*)    Alkaline Phosphatase 537 (*)    GFR calc non Af Amer 7 (*)    GFR calc Af Amer 8 (*)    All other components within normal limits  LIPASE, BLOOD  TYPE AND SCREEN  PREPARE RBC (CROSSMATCH)   ____________________________________________  EKG  ED ECG REPORT I, Rudene Re, the attending physician, personally viewed and interpreted this ECG.  Sinus bradycardia, rate of 54, first-degree AV block, prolonged QTC, normal axis, T-wave inversions in inferior lateral leads, no ST elevation. unchanged from prior from July 2018 ____________________________________________  RADIOLOGY  CT a/p: 1. No evidence of bowel obstruction or inflammation. Diverticulosis of the colon without evidence of diverticulitis. 2. Diffuse fatty infiltration of the liver. 3. Diffuse vascular calcification. Major vessels appear patent but areas of significant stenosis are not excluded. 4. Bilateral renal atrophy and renal parenchymal calcification consistent with history of chronic renal disease. 5. Bone changes consistent  with renal osteodystrophy. 6. Cardiac enlargement. Aortic Atherosclerosis (ICD10-I70.0). ____________________________________________   PROCEDURES  Procedure(s) performed: None Procedures Critical Care performed: yes  CRITICAL CARE Performed by: Rudene Re  ?  Total critical care time: 35 min  Critical care time was exclusive of separately billable procedures and treating other patients.  Critical care was necessary to treat or prevent imminent or life-threatening deterioration.  Critical care was time spent personally by me on the following activities: development of treatment plan with patient and/or surrogate as well as nursing, discussions with consultants, evaluation of patient's response to treatment, examination of patient, obtaining history from patient or surrogate, ordering and performing treatments and interventions, ordering and review of laboratory studies, ordering and review of radiographic studies, pulse oximetry and re-evaluation of patient's condition.   ____________________________________________   INITIAL IMPRESSION / ASSESSMENT AND PLAN / ED COURSE   71 y.o. female with a history of ESRD on HD (MWF), CAD, CHF, CVA, HTN, diverticulosis who presents for evaluation of abdominal pain, N/V/D since yesterday. patient is extremely well appearing, no distress she has normal vital signs, her abdomen is soft and tender in the periumbilical and left quadrant regions with no rebound or guarding. Differential diagnoses including diverticulitis versus colitis versus enteritis versus appendicitis. We'll check CBC, CMP, lipase, CT abdomen and pelvis. We'll give Zofran and morphine for pain.    _________________________ 10:50 PM on 01/19/2017 -----------------------------------------  hemoglobin noted to be 6.0, rectal exam showing melena. Patient started on protonix. She is on Plavix. CT abdomen and pelvis showing significant atherosclerotic disease with no major  vessel stenosis however raising concern for possible smaller vessel stenosis and mesenteric ischemia. Unable to start patient on heparin due to active GIB. The patient will need to be evaluated by vascular with a possible angiogram. She was started on protonic's, type and screen, and blood have been ordered. We'll plan to transfuse 2 units. Patient is currently being admitted to the hospitalist service. Patient consented for blood transfusion  Pertinent labs & imaging results that were available during my care of the patient were reviewed by me and considered in my medical decision making (see chart for details).    ____________________________________________   FINAL CLINICAL IMPRESSION(S) / ED DIAGNOSES  Final diagnoses:  UGIB (upper gastrointestinal bleed)  Generalized abdominal pain  Atherosclerosis      NEW MEDICATIONS STARTED DURING THIS  VISIT:  New Prescriptions   No medications on file     Note:  This document was prepared using Dragon voice recognition software and may include unintentional dictation errors.    Rudene Re, MD 01/19/17 2258

## 2017-01-19 NOTE — H&P (Signed)
[facility] [facility] [place] NAME: Toni Carlson    MR#:  [id number]  DATE OF BIRTH:  [birth year]  DATE OF ADMISSION:  01/19/2017  PRIMARY CARE PHYSICIAN: Murlean Iba, MD   REQUESTING/REFERRING PHYSICIAN: Alfred Levins, MD  CHIEF COMPLAINT:   Chief Complaint  Patient presents with  . Abdominal Pain    HISTORY OF PRESENT ILLNESS:  Toni Carlson  is a 71 y.o. female who presents with abdominal pain with nausea.here in the ED the patient was found to have melanotic stool. Initially her stool was normal, but turned melanotic while in the ED. Her hemoglobin is 6 today, whereas her baseline is 9. She had transfusion ordered in the ED, and hospitalists were called for admission and workup for GI bleed  PAST MEDICAL HISTORY:   Past Medical History:  Diagnosis Date  . A-fib (HCC)    a. on warfarin  . Anemia   . Carotid artery stenosis    a. ultrasound 03/2015: RICA 78-46% stenosis, LICA less than 96% stenosis  . Chronic diastolic CHF (congestive heart failure) ([place])    a. echo 03/2015: EF 60-65%, normal wall motion, diastolic parameters were c/w restrictive physiology, indicative of decreased LV diastolic compliance and/or increased LA pressure, mild AS, mild MR, left atrium was severely dilated, RA was severely dilated, PASP was severely increased at 90 mm Hg  . Coronary artery disease, non-occlusive    a. cardiac cath 2013  . ESRD (end stage renal disease) ([place])    On Tue-Thur-Sat dialysis  . H/O ischemic right MCA stroke    a. 03/2015; b. residual left-sided facial droop and slurred speech  . Hyperparathyroidism, secondary renal ([place])   . Hypertension   . Nicotine dependence   . Pulmonary HTN ([place])    as above  . Symptomatic bradycardia    a. history of symptomatic bradycardia; b. felt to be 2/2 metabolic abnormalities & required temp wire but no PPM, resolved with HD    PAST SURGICAL HISTORY:   Past Surgical History:  Procedure Laterality  Date  . A/V FISTULAGRAM Left 10/25/2016   Procedure: A/V Fistulagram;  Surgeon: Katha Cabal, MD;  Location: [place] [facility] LAB;  Service: Cardiovascular;  Laterality: Left;  . COLONOSCOPY WITH PROPOFOL N/A 12/19/2016   Procedure: COLONOSCOPY WITH PROPOFOL;  Surgeon: Jonathon Bellows, MD;  Location[facility] ENDOSCOPY;  Service: Gastroenterology;  Laterality: N/A;  . ESOPHAGOGASTRODUODENOSCOPY (EGD) WITH PROPOFOL N/A 04/14/2015   Procedure: ESOPHAGOGASTRODUODENOSCOPY (EGD) WITH PROPOFOL;  Surgeon: Lucilla Lame, MD;  Location: [facility] ENDOSCOPY;  Service: Endoscopy;  Laterality: N/A;  . ESOPHAGOGASTRODUODENOSCOPY (EGD) WITH PROPOFOL N/A 07/17/2015   Procedure: ESOPHAGOGASTRODUODENOSCOPY (EGD) WITH PROPOFOL;  Surgeon: Josefine Class, MD;  Location: [facility] ENDOSCOPY;  Service: Endoscopy;  Laterality: N/A;  . ESOPHAGOGASTRODUODENOSCOPY (EGD) WITH PROPOFOL N/A 12/19/2016   Procedure: ESOPHAGOGASTRODUODENOSCOPY (EGD) WITH PROPOFOL;  Surgeon: Jonathon Bellows, MD;  Location: [facility] ENDOSCOPY;  Service: Gastroenterology;  Laterality: N/A;  . EUS N/A 09/24/2015   Procedure: UPPER ENDOSCOPIC ULTRASOUND (EUS) LINEAR;  Surgeon: Jola Schmidt, MD;  Location: [facility] ENDOSCOPY;  Service: Endoscopy;  Laterality: N/A;  . LOWER EXTREMITY ANGIOGRAPHY Right 06/09/2016   Procedure: Lower Extremity Angiography;  Surgeon: Algernon Huxley, MD;  Location: [place] CV LAB;  Service: Cardiovascular;  Laterality: Right;  . PERIPHERAL VASCULAR CATHETERIZATION N/A 02/23/2015   Procedure: A/V Shuntogram/Fistulagram;  Surgeon: Algernon Huxley, MD;  Location: [place] CV LAB;  Service: Cardiovascular;  Laterality: N/A;  . PERIPHERAL VASCULAR CATHETERIZATION N/A 02/23/2015  Procedure: A/V Shunt Intervention;  Surgeon: Algernon Huxley, MD;  Location: [place] CV LAB;  Service: Cardiovascular;  Laterality: N/A;    SOCIAL HISTORY:   Social History  Substance Use Topics  . Smoking status: Former Smoker    Packs/day: 0.25    Years: 30.00     Types: Cigarettes    Quit date: 11/20/2013  . Smokeless tobacco: Never Used  . Alcohol use No    FAMILY HISTORY:   Family History  Problem Relation Age of Onset  . CAD Father   . Dementia Mother   . Lupus Sister     DRUG ALLERGIES:  No Known Allergies  MEDICATIONS AT HOME:   Prior to Admission medications   Medication Sig Start Date End Date Taking? Authorizing Provider  acetaminophen (TYLENOL) 500 MG tablet Take 500 mg by mouth every 4 (four) hours as needed for fever.   Yes [provider]  alum & mag hydroxide-simeth (MAALOX PLUS) 400-400-40 MG/5ML suspension Take 30 mLs by mouth as needed for indigestion.   Yes [provider]  amLODipine (NORVASC) 10 MG tablet Take 1 tablet (10 mg total) by mouth daily. Patient taking differently: Take 10 mg by mouth at bedtime.  09/19/15  Yes Carrie Mew, MD  aspirin EC 81 MG EC tablet Take 1 tablet (81 mg total) by mouth daily. 06/10/16  Yes Dew, Erskine Squibb, MD  atorvastatin (LIPITOR) 40 MG tablet Take 1 tablet (40 mg total) by mouth daily. Patient taking differently: Take 40 mg by mouth at bedtime.  09/19/15  Yes Carrie Mew, MD  calcium acetate (PHOSLO) 667 MG capsule Take 1,334 mg by mouth 3 (three) times daily.   Yes [provider]  carvedilol (COREG) 6.25 MG tablet Take 1 tablet (6.25 mg total) by mouth 2 (two) times daily with a meal. 09/19/15  Yes Carrie Mew, MD  cetirizine (ZYRTEC) 10 MG tablet Take 10 mg by mouth daily.   Yes [provider]  clopidogrel (PLAVIX) 75 MG tablet Take 1 tablet (75 mg total) by mouth daily. 06/10/16  Yes Algernon Huxley, MD  escitalopram (LEXAPRO) 10 MG tablet Take 10 mg by mouth daily.   Yes [provider]  gabapentin (NEURONTIN) 300 MG capsule Take 300 mg by mouth at bedtime.   Yes [provider]  guaifenesin (ROBITUSSIN) 100 MG/5ML syrup Take 200 mg by mouth every 6 (six) hours as needed for cough.   Yes [provider]  loperamide  (IMODIUM) 2 MG capsule Take 2 mg by mouth as needed for diarrhea or loose stools.   Yes [provider]  losartan (COZAAR) 100 MG tablet Take 1 tablet (100 mg total) by mouth daily. Patient taking differently: Take 100 mg by mouth at bedtime.  09/19/15  Yes Carrie Mew, MD  magnesium hydroxide (MILK OF MAGNESIA) 400 MG/5ML suspension Take 30 mLs by mouth at bedtime as needed for mild constipation.   Yes [provider]  neomycin-bacitracin-polymyxin (NEOSPORIN) ointment Apply 1 application topically as needed for wound care. apply to eye   Yes [provider]  ondansetron (ZOFRAN) 4 MG tablet Take 4 mg by mouth every 8 (eight) hours as needed for nausea or vomiting.   Yes [provider]  pantoprazole (PROTONIX) 40 MG tablet Take 1 tablet (40 mg total) by mouth 2 (two) times daily. 09/19/15  Yes Carrie Mew, MD  polyethylene glycol [facility]aveling) packet Take 17 g by mouth daily as needed for mild constipation. 09/19/15  Yes  Carrie Mew, MD  spironolactone (ALDACTONE) 25 MG tablet Take 1 tablet (25 mg total) by mouth daily. 12/20/16  Yes Fritzi Mandes, MD  traZODone (DESYREL) 50 MG tablet Take 25 mg by mouth at bedtime.  05/18/16  Yes [provider]  trolamine salicylate (ASPERCREME) 10 % cream Apply topically 2 (two) times daily as needed for muscle pain. 09/19/15  Yes Carrie Mew, MD    REVIEW OF SYSTEMS:  Review of Systems  Constitutional: Negative for chills, fever, malaise/fatigue and weight loss.  HENT: Negative for ear pain, hearing loss and tinnitus.   Eyes: Negative for blurred vision, double vision, pain and redness.  Respiratory: Negative for cough, hemoptysis and shortness of breath.   Cardiovascular: Negative for chest pain, palpitations, orthopnea and leg swelling.  Gastrointestinal: Positive for abdominal pain, melena, nausea and vomiting. Negative for constipation and diarrhea.  Genitourinary: Negative for dysuria,  frequency and hematuria.  Musculoskeletal: Negative for back pain, joint pain and neck pain.  Skin:       No acne, rash, or lesions  Neurological: Negative for dizziness, tremors, focal weakness and weakness.  Endo/Heme/Allergies: Negative for polydipsia. Does not bruise/bleed easily.  Psychiatric/Behavioral: Negative for depression. The patient is not nervous/anxious and does not have insomnia.      VITAL SIGNS:   Vitals:   01/19/17 2009 01/19/17 2010 01/19/17 2200  BP: (!) 117/52  138/64  Pulse: (!) 45  (!) 50  Resp: 16  14  Temp: 98.9 F (37.2 C)    TempSrc: Oral    SpO2: 96%  99%  Weight:  68.9 kg (152 lb)   Height:  5\' 7"  (1.702 m)    Wt Readings from Last 3 Encounters:  01/19/17 68.9 kg (152 lb)  12/29/16 68.9 kg (152 lb)  12/19/16 72 kg (158 lb 11.7 oz)    PHYSICAL EXAMINATION:  Physical Exam  Vitals reviewed. Constitutional: She is oriented to person, place, and time. She appears well-developed and well-nourished. No distress.  HENT:  Head: Normocephalic and atraumatic.  Mouth/Throat: Oropharynx is clear and moist.  Eyes: Pupils are equal, round, and reactive to light. Conjunctivae and EOM are normal. No scleral icterus.  Neck: Normal range of motion. Neck supple. No JVD present. No thyromegaly present.  Cardiovascular: Normal rate, regular rhythm and intact distal pulses.  Exam reveals no gallop and no friction rub.   No murmur heard. Respiratory: Effort normal and breath sounds normal. No respiratory distress. She has no wheezes. She has no rales.  GI: Soft. Bowel sounds are normal. She exhibits no distension. There is tenderness.  Musculoskeletal: Normal range of motion. She exhibits no edema.  No arthritis, no gout  Lymphadenopathy:    She has no cervical adenopathy.  Neurological: She is alert and oriented to person, place, and time. No cranial nerve deficit.  No dysarthria, no aphasia  Skin: Skin is warm and dry. No rash noted. No erythema.  Psychiatric:  She has a normal mood and affect. Her behavior is normal. Judgment and thought content normal.    LABORATORY PANEL:   CBC  Recent Labs Lab 01/19/17 2008  WBC 5.8  HGB 6.0*  HCT 18.4*  PLT 166   ------------------------------------------------------------------------------------------------------------------  Chemistries   Recent Labs Lab 01/19/17 2008  NA 138  K 3.9  CL 96*  CO2 30  GLUCOSE 111*  BUN 37*  CREATININE 5.57*  CALCIUM 7.7*  AST 16  ALT 10*  ALKPHOS 537*  BILITOT 0.4   ------------------------------------------------------------------------------------------------------------------  Cardiac Enzymes No results  for input(s): TROPONINI in the last 168 hours. ------------------------------------------------------------------------------------------------------------------  RADIOLOGY:  Ct Abdomen Pelvis W Contrast  Result Date: 01/19/2017 CLINICAL DATA:  Generalized abdominal pain, nausea, and vomiting since yesterday after dialysis. Diverticulitis is suspected. EXAM: CT ABDOMEN AND PELVIS WITH CONTRAST TECHNIQUE: Multidetector CT imaging of the abdomen and pelvis was performed using the standard protocol following bolus administration of intravenous contrast. CONTRAST:  148mL ISOVUE-300 IOPAMIDOL (ISOVUE-300) INJECTION 61% COMPARISON:  12/17/2016 FINDINGS: Lower chest: Motion artifact limits evaluation. Probable atelectasis in the lung bases. Mild cardiac enlargement. Coronary artery and mitral valve calcifications. Hepatobiliary: Diffuse fatty infiltration of the liver. No focal liver abnormality is seen. Status post cholecystectomy. No biliary dilatation. Pancreas: Unremarkable. No pancreatic ductal dilatation or surrounding inflammatory changes. Spleen: Normal in size without focal abnormality. Adrenals/Urinary Tract: No adrenal gland nodules. Kidneys are atrophic. Parenchymal calcifications bilaterally likely representing chronic dystrophic calcification.  Extensive renal artery calcifications. No hydronephrosis. Bladder is decompressed. Stomach/Bowel: Stomach, small bowel, and colon are not abnormally distended. Prominent stool in the rectum. Colonic diverticulosis. No inflammatory changes. Appendix is normal. Vascular/Lymphatic: Mild prominence of lymph nodes in the retrocrural space, retroperitoneum, and mesenteric, likely reactive. No pathologic lymphadenopathy. Extensive vascular calcification throughout the abdominal aorta and all major branch vessels. Vessels are patent but areas of significant stenosis are likely. Mesenteric artery calcification could contribute to intestinal angina. No aneurysms. Reproductive: Status post hysterectomy. No adnexal masses. Other: No free air or free fluid in the abdomen. There is a anterior abdominal wall hernia above the umbilicus containing fat. Mild stranding in the fat could indicate fat necrosis. Appearance is similar to previous study. Musculoskeletal: Diffuse bone demineralization with trabecular coarsening and peripheral sclerosis likely representing renal osteodystrophy changes. No destructive bone lesions. IMPRESSION: 1. No evidence of bowel obstruction or inflammation. Diverticulosis of the colon without evidence of diverticulitis. 2. Diffuse fatty infiltration of the liver. 3. Diffuse vascular calcification. Major vessels appear patent but areas of significant stenosis are not excluded. 4. Bilateral renal atrophy and renal parenchymal calcification consistent with history of chronic renal disease. 5. Bone changes consistent with renal osteodystrophy. 6. Cardiac enlargement. Aortic Atherosclerosis (ICD10-I70.0). Electronically Signed   By: Lucienne Capers M.D.   On: 01/19/2017 21:10    EKG:   Orders placed or performed during the hospital encounter of 01/19/17  . ED EKG  . ED EKG    IMPRESSION AND PLAN:  Principal Problem:   Acute GI bleeding - IV PPI, GI consult, PRBC transfusion Active Problems:    Atrial fibrillation ([place]) - continue home meds   Essential hypertension - continue home medications   End stage renal disease on dialysis [facility]) - nephrology consult   Atherosclerotic peripheral vascular disease ([place]) - continue home meds  All the records are reviewed and case discussed with ED provider. Management plans discussed with the patient and/or family.  DVT PROPHYLAXIS: Mechanical only  GI PROPHYLAXIS: PPI  ADMISSION STATUS: Inpatient  CODE STATUS: Full Code Status History    Date Active Date Inactive Code Status Order ID Comments User Context   12/17/2016  6:30 AM 12/20/2016 12:17 AM Full Code [id number]  Saundra Shelling, MD Inpatient   11/16/2016  8:51 PM 11/17/2016  3:31 PM Full Code [id number]  Fritzi Mandes, MD Inpatient   06/09/2016  6:39 PM 06/10/2016  6:35 PM Full Code [id number]  Algernon Huxley, MD Inpatient   07/15/2015  1:10 PM 07/17/2015  4:30 PM Full Code [id number]  Hillary Bow, MD ED   07/02/2015  5:23 PM 07/08/2015  8:09 PM Full Code [id number]  Gladstone Lighter, MD Inpatient   04/12/2015  1:03 PM 04/15/2015  6:00 PM DNR [id number]  Bettey Costa, MD Inpatient   03/26/2015  4:05 PM 04/01/2015  8:24 PM Full Code [id number]  Gladstone Lighter, MD Inpatient   02/23/2015  9:39 AM 02/23/2015  1:41 PM Full Code [id number]  Algernon Huxley, MD Inpatient      TOTAL TIME TAKING CARE OF THIS PATIENT: 45 minutes.   Luke Rigsbee [place] 01/19/2017, 11:03 PM  [employer]  [phone]  CC: Primary care physician; Murlean Iba, MD  Note:  This document was prepared using Dragon voice recognition software and may include unintentional dictation errors.

## 2017-01-19 NOTE — ED Notes (Signed)
Pharmacy notified to send Protonix dose.

## 2017-01-19 NOTE — ED Notes (Signed)
Patient oxygen saturation dropped to 88% on room air. RN to bedside. Pulse oximetry sensor replaced. Patient repositioned.  Patient's oxygen saturation did not improve (stayed 87-88% on RA).   Patient placed on 2L [place]. [place] Kitchen Patient was being asked to sign paperwork by registration. Pt reported that she could sign, however would not take pen.  Patient kept repeating she would sign but would not take pen.  MD Alfred Levins at bedside.

## 2017-01-20 LAB — CBC
HCT: 26.1 % — ABNORMAL LOW (ref 35.0–47.0)
Hemoglobin: 8.9 g/dL — ABNORMAL LOW (ref 12.0–16.0)
MCH: 30.6 pg (ref 26.0–34.0)
MCHC: 34.2 g/dL (ref 32.0–36.0)
MCV: 89.5 fL (ref 80.0–100.0)
PLATELETS: 161 10*3/uL (ref 150–440)
RBC: 2.91 MIL/uL — ABNORMAL LOW (ref 3.80–5.20)
RDW: 20.3 % — AB (ref 11.5–14.5)
WBC: 5.9 10*3/uL (ref 3.6–11.0)

## 2017-01-20 LAB — BASIC METABOLIC PANEL
Anion gap: 13 (ref 5–15)
BUN: 40 mg/dL — AB (ref 6–20)
CALCIUM: 8.2 mg/dL — AB (ref 8.9–10.3)
CO2: 29 mmol/L (ref 22–32)
CREATININE: 6.15 mg/dL — AB (ref 0.44–1.00)
Chloride: 95 mmol/L — ABNORMAL LOW (ref 101–111)
GFR calc non Af Amer: 6 mL/min — ABNORMAL LOW (ref >60)
GFR, EST AFRICAN AMERICAN: 7 mL/min — AB (ref >60)
GLUCOSE: 82 mg/dL (ref 65–99)
Potassium: 4.2 mmol/L (ref 3.5–5.1)
Sodium: 137 mmol/L (ref 135–145)

## 2017-01-20 LAB — MRSA PCR SCREENING: MRSA BY PCR: NEGATIVE

## 2017-01-20 LAB — HEMOGLOBIN
Hemoglobin: 8.7 g/dL — ABNORMAL LOW (ref 12.0–16.0)
Hemoglobin: 8.5 g/dL — ABNORMAL LOW (ref 12.0–16.0)

## 2017-01-20 MED ORDER — EPOETIN ALFA 10000 UNIT/ML IJ SOLN: 10000.0000 [IU] | INTRAMUSCULAR | Status: DC

## 2017-01-20 NOTE — Clinical Social Work Note (Signed)
Clinical Social Work Assessment  Patient Details  Name: Toni Carlson MRN: [id number] Date of Birth: [birth year]  Date of referral:  01/20/17               Reason for consult:  Discharge Planning                Permission sought to share information with:  Facility Art therapist granted to share information::  Yes, Verbal Permission Granted  Name::        Agency::     Relationship::     Contact Information:     Housing/Transportation Living arrangements for the past 2 months:  [place] of Information:  Patient Patient Interpreter Needed:  None Criminal Activity/Legal Involvement Pertinent to Current Situation/Hospitalization:  No - Comment as needed Significant Relationships:    Lives with:  Facility Resident Do you feel safe going back to the place where you live?  Yes Need for family participation in patient care:  No (Coment)  Care giving concerns:  Patient resides at [place] term.   Social Worker assessment / plan:  CSW met with patient who is alert and oriented X4. Patient said, "I'm back again!" with a smile on her face. She is a very pleasant lady who states that she continues to live at [place] and wishes to return at discharge. CSW spoke with [facility], Vicki Mallet, and patient can return at discharge.   Employment status:  Disabled (Comment on whether or not currently receiving Disability) Insurance information:    PT Recommendations:    Information / Referral to community resources:     Patient/Family's Response to care:  Patient expressed appreciation for CSW assistance.   Patient/Family's Understanding of and Emotional Response to Diagnosis, Current Treatment, and Prognosis:  Patient is hopeful that the MD's will find out what is wrong with her soon. She states that she just recently had a colonoscopy and is hoping she does not have to have another one.  Emotional  Assessment Appearance:  Appears stated age Attitude/Demeanor/Rapport:   (pleasant and cooperative) Affect (typically observed):  Accepting, Adaptable, Calm, Pleasant Orientation:  Oriented to Self, Oriented to Place, Oriented to  Time, Oriented to Situation Alcohol / Substance use:  Not Applicable Psych involvement (Current and /or in the community):  No (Comment)  Discharge Needs  Concerns to be addressed:  Care Coordination Readmission within the last 30 days:  Yes Current discharge risk:  None Barriers to Discharge:  No Barriers Identified   Shela Leff, [place] 01/20/2017, 11:17 AM

## 2017-01-20 NOTE — Progress Notes (Signed)
[facility]  ROUNDING NOTE   Subjective:   [name] admitted to [facility] on 01/19/2017 on Generalized abdominal pain [R10.84] UGIB (upper gastrointestinal bleed) [K92.2] Atherosclerosis [I70.90]  Last hemodialysis on Wednesday. Scheduled for dialysis for later today.   Objective:  Vital signs in last 24 hours:  Temp:  [97.1 F (36.2 C)-98.9 F (37.2 C)] 98.4 F (36.9 C) (09/07 0732) Pulse Rate:  [45-56] 50 (09/07 0732) Resp:  [14-20] 17 (09/07 0732) BP: (113-138)/(42-83) 123/54 (09/07 0732) SpO2:  [93 %-100 %] 99 % (09/07 0732) Weight:  [68.9 kg (152 lb)-73.9 kg (163 lb)] 73.9 kg (163 lb) (09/07 0011)  Weight change:  Filed Weights   01/19/17 2010 01/20/17 0011  Weight: 68.9 kg (152 lb) 73.9 kg (163 lb)    Intake/Output: I/O last 3 completed shifts: In: 196 [I.V.:128; Blood:350; IV Piggyback:100] Out: 0    Intake/Output this shift:  Total I/O In: 490 [I.V.:122; Blood:368] Out: -   Physical Exam: General: NAD, laying in bed  Head: Normocephalic, atraumatic. Moist oral mucosal membranes  Eyes: Anicteric, PERRL  Neck: Supple, trachea midline  Lungs:  Clear to auscultation  Heart: Regular rate and rhythm  Abdomen:  Soft, nontender,   Extremities:  no  peripheral edema.  Neurologic: Nonfocal, moving all four extremities  Skin: No lesions  Access: Left AVF    Basic Metabolic Panel:  Recent Labs Lab 01/19/17 2008 01/20/17 0833  NA 138 137  K 3.9 4.2  CL 96* 95*  CO2 30 29  GLUCOSE 111* 82  BUN 37* 40*  CREATININE 5.57* 6.15*  CALCIUM 7.7* 8.2*    Liver Function Tests:  Recent Labs Lab 01/19/17 2008  AST 16  ALT 10*  ALKPHOS 537*  BILITOT 0.4  PROT 6.2*  ALBUMIN 3.3*    Recent Labs Lab 01/19/17 2008  LIPASE 23   No results for input(s): AMMONIA in the last 168 hours.  CBC:  Recent Labs Lab 01/19/17 2008 01/20/17 0833  WBC 5.8 5.9  NEUTROABS 4.3  --   HGB 6.0* 8.9*  HCT 18.4* 26.1*  MCV 94.9 89.5  PLT 166  161    Cardiac Enzymes: No results for input(s): CKTOTAL, CKMB, CKMBINDEX, TROPONINI in the last 168 hours.  BNP: Invalid input(s): POCBNP  CBG: No results for input(s): GLUCAP in the last 168 hours.  Microbiology: Results for orders placed or performed during the hospital encounter of 01/19/17  MRSA PCR Screening     Status: None   Collection Time: 01/20/17 12:36 AM  Result Value Ref Range Status   MRSA by PCR NEGATIVE NEGATIVE Final    Comment:        The GeneXpert MRSA Assay (FDA approved for NASAL specimens only), is one component of a comprehensive MRSA colonization surveillance program. It is not intended to diagnose MRSA infection nor to guide or monitor treatment for MRSA infections.     Coagulation Studies: No results for input(s): LABPROT, INR in the last 72 hours.  Urinalysis: No results for input(s): COLORURINE, LABSPEC, PHURINE, GLUCOSEU, HGBUR, BILIRUBINUR, KETONESUR, PROTEINUR, UROBILINOGEN, NITRITE, LEUKOCYTESUR in the last 72 hours.  Invalid input(s): APPERANCEUR    Imaging: Ct Abdomen Pelvis W Contrast  Result Date: 01/19/2017 CLINICAL DATA:  Generalized abdominal pain, nausea, and vomiting since yesterday after dialysis. Diverticulitis is suspected. EXAM: CT ABDOMEN AND PELVIS WITH CONTRAST TECHNIQUE: Multidetector CT imaging of the abdomen and pelvis was performed using the standard protocol following bolus administration of intravenous contrast. CONTRAST:  131mL ISOVUE-300 IOPAMIDOL (  ISOVUE-300) INJECTION 61% COMPARISON:  12/17/2016 FINDINGS: Lower chest: Motion artifact limits evaluation. Probable atelectasis in the lung bases. Mild cardiac enlargement. Coronary artery and mitral valve calcifications. Hepatobiliary: Diffuse fatty infiltration of the liver. No focal liver abnormality is seen. Status post cholecystectomy. No biliary dilatation. Pancreas: Unremarkable. No pancreatic ductal dilatation or surrounding inflammatory changes. Spleen: Normal in  size without focal abnormality. Adrenals/Urinary Tract: No adrenal gland nodules. Kidneys are atrophic. Parenchymal calcifications bilaterally likely representing chronic dystrophic calcification. Extensive renal artery calcifications. No hydronephrosis. Bladder is decompressed. Stomach/Bowel: Stomach, small bowel, and colon are not abnormally distended. Prominent stool in the rectum. Colonic diverticulosis. No inflammatory changes. Appendix is normal. Vascular/Lymphatic: Mild prominence of lymph nodes in the retrocrural space, retroperitoneum, and mesenteric, likely reactive. No pathologic lymphadenopathy. Extensive vascular calcification throughout the abdominal aorta and all major branch vessels. Vessels are patent but areas of significant stenosis are likely. Mesenteric artery calcification could contribute to intestinal angina. No aneurysms. Reproductive: Status post hysterectomy. No adnexal masses. Other: No free air or free fluid in the abdomen. There is a anterior abdominal wall hernia above the umbilicus containing fat. Mild stranding in the fat could indicate fat necrosis. Appearance is similar to previous study. Musculoskeletal: Diffuse bone demineralization with trabecular coarsening and peripheral sclerosis likely representing renal osteodystrophy changes. No destructive bone lesions. IMPRESSION: 1. No evidence of bowel obstruction or inflammation. Diverticulosis of the colon without evidence of diverticulitis. 2. Diffuse fatty infiltration of the liver. 3. Diffuse vascular calcification. Major vessels appear patent but areas of significant stenosis are not excluded. 4. Bilateral renal atrophy and renal parenchymal calcification consistent with history of chronic renal disease. 5. Bone changes consistent with renal osteodystrophy. 6. Cardiac enlargement. Aortic Atherosclerosis (ICD10-I70.0). Electronically Signed   By: Lucienne Capers M.D.   On: 01/19/2017 21:10     Medications:   . pantoprozole  (PROTONIX) infusion 8 mg/hr (01/20/17 0952)   . aspirin EC  81 mg Oral Daily  . calcium acetate  1,334 mg Oral TID  . clopidogrel  75 mg Oral Daily  . [START ON 01/23/2017] epoetin (EPOGEN/PROCRIT) injection  10,000 Units Intravenous Q M,W,F-HD  . escitalopram  10 mg Oral Daily   acetaminophen **OR** acetaminophen, ondansetron **OR** ondansetron (ZOFRAN) IV  Assessment/ Plan:  [name] is a 71 y.o. 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   [place]   1. End Stage Renal Disease: on hemodialysis. MWF schedule. Patient due for hemodialysis today. Orders have been prepared.   2. Anemia of chronic kidney disease: with GI bleed. Hemoglobin 6 on admission.  Status post 2 units PRBC transfusion Getting epo with hemodialysis.  - Patient found to have mass on ileocecal valve. Repeat endoscopy as per GI.   3. Secondary Hyperparathyroidism: PTH on 8/13 at 3636. On etelcalcitide and hectorol as outpatient  - calcium acetate binders  4. Hypertension: blood pressure is at goal   LOS: [place], [place] 9/7/20181:15 PM

## 2017-01-20 NOTE — Progress Notes (Signed)
[place] [facility] [place] NAME: Toni Carlson    MR#:  [id number]  DATE OF BIRTH:  [birth year]  SUBJECTIVE:  CHIEF COMPLAINT:   Chief Complaint  Patient presents with  . Abdominal Pain   came with black stools. And low hemoglobin. Had EGD and colonoscopy last month with a non cancerous lesion in ileocecal region. Hb low, received 2 units PRBC. No pain in abdomen and vomiting.  REVIEW OF SYSTEMS:  CONSTITUTIONAL: No fever, fatigue or weakness.  EYES: No blurred or double vision.  EARS, NOSE, AND THROAT: No tinnitus or ear pain.  RESPIRATORY: No cough, shortness of breath, wheezing or hemoptysis.  CARDIOVASCULAR: No chest pain, orthopnea, edema.  GASTROINTESTINAL: No nausea, vomiting, diarrhea or abdominal pain. Dark stool. GENITOURINARY: No dysuria, hematuria.  ENDOCRINE: No polyuria, nocturia,  HEMATOLOGY: No anemia, easy bruising or bleeding SKIN: No rash or lesion. MUSCULOSKELETAL: No joint pain or arthritis.   NEUROLOGIC: No tingling, numbness, weakness.  PSYCHIATRY: No anxiety or depression.   ROS  DRUG ALLERGIES:  No Known Allergies  VITALS:  Blood pressure (!) 137/48, pulse (!) 57, temperature 98.3 F (36.8 C), temperature source Oral, resp. rate 18, height 5\' 6"  (1.676 m), weight 74.1 kg (163 lb 5.8 oz), SpO2 94 %.  PHYSICAL EXAMINATION:  GENERAL:  71 y.o.-year-old patient lying in the bed with no acute distress.  EYES: Pupils equal, round, reactive to light and accommodation. No scleral icterus. Extraocular muscles intact. Conjunctiva pale. HEENT: Head atraumatic, normocephalic. Oropharynx and nasopharynx clear.  NECK:  Supple, no jugular venous distention. No thyroid enlargement, no tenderness.  LUNGS: Normal breath sounds bilaterally, no wheezing, rales,rhonchi or crepitation. No use of accessory muscles of respiration.  CARDIOVASCULAR: S1, S2 normal. No murmurs, rubs, or gallops.  ABDOMEN: Soft, nontender, nondistended. Bowel  sounds present. No organomegaly or mass.  EXTREMITIES: No pedal edema, cyanosis, or clubbing.  NEUROLOGIC: Cranial nerves II through XII are intact. Muscle strength 5/5 in all extremities. Sensation intact. Gait not checked.  PSYCHIATRIC: The patient is alert and oriented x 3.  SKIN: No obvious rash, lesion, or ulcer.   Physical Exam LABORATORY PANEL:   CBC  Recent Labs Lab 01/20/17 0833 01/20/17 1502  WBC 5.9  --   HGB 8.9* 8.7*  HCT 26.1*  --   PLT 161  --    ------------------------------------------------------------------------------------------------------------------  Chemistries   Recent Labs Lab 01/19/17 2008 01/20/17 0833  NA 138 137  K 3.9 4.2  CL 96* 95*  CO2 30 29  GLUCOSE 111* 82  BUN 37* 40*  CREATININE 5.57* 6.15*  CALCIUM 7.7* 8.2*  AST 16  --   ALT 10*  --   ALKPHOS 537*  --   BILITOT 0.4  --    ------------------------------------------------------------------------------------------------------------------  Cardiac Enzymes No results for input(s): TROPONINI in the last 168 hours. ------------------------------------------------------------------------------------------------------------------  RADIOLOGY:  Ct Abdomen Pelvis W Contrast  Result Date: 01/19/2017 CLINICAL DATA:  Generalized abdominal pain, nausea, and vomiting since yesterday after dialysis. Diverticulitis is suspected. EXAM: CT ABDOMEN AND PELVIS WITH CONTRAST TECHNIQUE: Multidetector CT imaging of the abdomen and pelvis was performed using the standard protocol following bolus administration of intravenous contrast. CONTRAST:  122mL ISOVUE-300 IOPAMIDOL (ISOVUE-300) INJECTION 61% COMPARISON:  12/17/2016 FINDINGS: Lower chest: Motion artifact limits evaluation. Probable atelectasis in the lung bases. Mild cardiac enlargement. Coronary artery and mitral valve calcifications. Hepatobiliary: Diffuse fatty infiltration of the liver. No focal liver abnormality is seen. Status post  cholecystectomy. No biliary dilatation. Pancreas:  Unremarkable. No pancreatic ductal dilatation or surrounding inflammatory changes. Spleen: Normal in size without focal abnormality. Adrenals/Urinary Tract: No adrenal gland nodules. Kidneys are atrophic. Parenchymal calcifications bilaterally likely representing chronic dystrophic calcification. Extensive renal artery calcifications. No hydronephrosis. Bladder is decompressed. Stomach/Bowel: Stomach, small bowel, and colon are not abnormally distended. Prominent stool in the rectum. Colonic diverticulosis. No inflammatory changes. Appendix is normal. Vascular/Lymphatic: Mild prominence of lymph nodes in the retrocrural space, retroperitoneum, and mesenteric, likely reactive. No pathologic lymphadenopathy. Extensive vascular calcification throughout the abdominal aorta and all major branch vessels. Vessels are patent but areas of significant stenosis are likely. Mesenteric artery calcification could contribute to intestinal angina. No aneurysms. Reproductive: Status post hysterectomy. No adnexal masses. Other: No free air or free fluid in the abdomen. There is a anterior abdominal wall hernia above the umbilicus containing fat. Mild stranding in the fat could indicate fat necrosis. Appearance is similar to previous study. Musculoskeletal: Diffuse bone demineralization with trabecular coarsening and peripheral sclerosis likely representing renal osteodystrophy changes. No destructive bone lesions. IMPRESSION: 1. No evidence of bowel obstruction or inflammation. Diverticulosis of the colon without evidence of diverticulitis. 2. Diffuse fatty infiltration of the liver. 3. Diffuse vascular calcification. Major vessels appear patent but areas of significant stenosis are not excluded. 4. Bilateral renal atrophy and renal parenchymal calcification consistent with history of chronic renal disease. 5. Bone changes consistent with renal osteodystrophy. 6. Cardiac enlargement.  Aortic Atherosclerosis (ICD10-I70.0). Electronically Signed   By: Lucienne Capers M.D.   On: 01/19/2017 21:10    ASSESSMENT AND PLAN:   Principal Problem:   Acute GI bleeding Active Problems:   Atrial fibrillation (HCC)   Essential hypertension   End stage renal disease on dialysis [facility])   Atherosclerotic peripheral vascular disease (HCC)   GI bleed   *Acute GI bleeding - IV PPI, GI consult, PRBC transfusion   Liquid diet.  *  Atrial fibrillation ([place]) - continue home meds, hold antiplatelets due to bleed. *  Essential hypertension - continue home medications *  End stage renal disease on dialysis [facility]) - nephrology consult *  Atherosclerotic peripheral vascular disease ([place]) - continue home meds    All the records are reviewed and case discussed with Care Management/Social Workerr. Management plans discussed with the patient, family and they are in agreement.  CODE STATUS: full.  TOTAL TIME TAKING CARE OF THIS PATIENT: 35 minutes.     POSSIBLE D/C IN 1-2 DAYS, DEPENDING ON CLINICAL CONDITION.   Vaughan Basta M.D on 01/20/2017   Between 7am to 6pm - Pager - [phone]  After 6pm go to [email or web address] - password [email or web address] [place] [facility]  Office  [phone]  CC: Primary care physician; Murlean Iba, MD  Note: This dictation was prepared with Dragon dictation along with smaller phrase technology. Any transcriptional errors that result from this process are unintentional.

## 2017-01-20 NOTE — Progress Notes (Signed)
Pre hd info 

## 2017-01-20 NOTE — Progress Notes (Signed)
End of hd, pt alert, no c/o, stable

## 2017-01-20 NOTE — Progress Notes (Signed)
Hd start, brady per baseline, alert, no c/o, stable

## 2017-01-20 NOTE — Progress Notes (Signed)
Post hd vitals 

## 2017-01-20 NOTE — Care Management (Signed)
Toni Carlson HD liaison notified of admission.  

## 2017-01-20 NOTE — Progress Notes (Signed)
Post hd assessment 

## 2017-01-20 NOTE — Progress Notes (Signed)
Pre hd assessment  

## 2017-01-21 DIAGNOSIS — K921 Melena: Secondary | ICD-10-CM

## 2017-01-21 LAB — TYPE AND SCREEN
ABO/RH(D): A POS
ANTIBODY SCREEN: NEGATIVE
Unit division: 0
Unit division: 0

## 2017-01-21 LAB — BPAM RBC
Blood Product Expiration Date: 201809252359
Blood Product Expiration Date: 201809252359
ISSUE DATE / TIME: 201809070046
ISSUE DATE / TIME: 201809070407
Unit Type and Rh: 6200
Unit Type and Rh: 6200

## 2017-01-21 LAB — IRON AND TIBC
IRON: 20 ug/dL — AB (ref 28–170)
Saturation Ratios: 7 % — ABNORMAL LOW (ref 10.4–31.8)
TIBC: 273 ug/dL (ref 250–450)
UIBC: 253 ug/dL

## 2017-01-21 LAB — HEPATITIS B SURFACE ANTIBODY,QUALITATIVE: Hep B S Ab: NONREACTIVE

## 2017-01-21 LAB — HEMOGLOBIN: HEMOGLOBIN: 8.7 g/dL — AB (ref 12.0–16.0)

## 2017-01-21 LAB — FERRITIN: Ferritin: 494 ng/mL — ABNORMAL HIGH (ref 11–307)

## 2017-01-21 MED ORDER — PANTOPRAZOLE SODIUM 40 MG PO TBEC: 40.0000 mg | DELAYED_RELEASE_TABLET | Freq: Two times a day (BID) | ORAL | Status: DC

## 2017-01-21 NOTE — Clinical Social Work Note (Signed)
Patient will discharge to [place] via family transport today. The facility is in agreement. The packet will be delivered once the discharge summary is complete and the FL 2 is signed. CSW will continue to follow pending additional discharge needs.  Santiago Bumpers, MSW, Latanya Presser [phone]

## 2017-01-21 NOTE — Discharge Instructions (Signed)
Heart healthy diet

## 2017-01-21 NOTE — NC FL2 (Signed)
[place] LEVEL OF CARE SCREENING TOOL     IDENTIFICATION  Patient Name: Toni Carlson Birthdate: 03-01-1946 Sex: female Admission Date (Current Location): 01/19/2017  [facility] and [place] Number:  Engineering geologist and Address:  [facility], [address], [place], [place] [address]      Provider Number: [id number]  Attending Physician Name and Address:  Demetrios Loll, MD  Relative Name and Phone Number:       Current Level of Care: Hospital Recommended Level of Care: [place] Prior Approval Number:    Date Approved/Denied:   PASRR Number:    Discharge Plan:  (assisted living facility)    Current Diagnoses: Patient Active Problem List   Diagnosis Date Noted  . Complication from renal dialysis device 11/27/2016  . Atherosclerosis of artery of extremity with rest pain ([place]) 06/09/2016  . Atherosclerosis of native arteries of extremity with rest pain ([place]) 05/24/2016  . Gastric polyp 08/18/2015  . Anemia 07/15/2015  . GI bleed 07/15/2015  . Polyneuropathy 07/08/2015  . Degenerative disc disease, lumbar 07/08/2015  . Pancytopenia ([place]) 07/08/2015  . Generalized weakness 07/08/2015  . End stage renal disease on dialysis ([place]) 07/08/2015  . Atherosclerotic peripheral vascular disease ([place]) 07/08/2015  . Pain in both feet 07/02/2015  . Gastric ulceration   . Acute GI bleeding   . GIB (gastrointestinal bleeding) 04/12/2015  . Left-sided weakness 04/01/2015  . Essential hypertension 04/01/2015  . Anemia of chronic disease 04/01/2015  . Elevated troponin 04/01/2015  . Pulmonary hypertension ([place]) 04/01/2015  . Atrial fibrillation ([place])   . CVA (cerebral infarction) 03/26/2015    Orientation RESPIRATION BLADDER Height & Weight     Self, Time, Situation, Place  Normal, O2 (2 liters) Continent Weight: 163 lb 5.8 oz (74.1 kg) Height:  5\' 6"  (167.6 cm)  BEHAVIORAL SYMPTOMS/MOOD NEUROLOGICAL BOWEL NUTRITION STATUS    (none)   Continent    AMBULATORY STATUS COMMUNICATION OF NEEDS Skin   Limited Assist Verbally Normal                       Personal Care Assistance Level of Assistance  Bathing, Dressing Bathing Assistance: Limited assistance   Dressing Assistance: Limited assistance     Functional Limitations Info             SPECIAL CARE FACTORS FREQUENCY                       Contractures Contractures Info: Not present    Additional Factors Info  Allergies   Allergies Info: nka           Current Medications (01/21/2017):  This is the current hospital active medication list Current Facility-Administered Medications  Medication Dose Route Frequency Provider Last Rate Last Dose  . acetaminophen (TYLENOL) tablet 650 mg  650 mg Oral Q6H PRN Lance Coon, MD       Or  . acetaminophen (TYLENOL) suppository 650 mg  650 mg Rectal Q6H PRN Lance Coon, MD      . calcium acetate (PHOSLO) capsule 1,334 mg  1,334 mg Oral TID Lance Coon, MD   1,334 mg at 01/21/17 1028  . [START ON 01/23/2017] epoetin alfa (EPOGEN,PROCRIT) injection 10,000 Units  10,000 Units Intravenous Q M,W,F-HD Kolluru, Sarath, MD      . escitalopram (LEXAPRO) tablet 10 mg  10 mg Oral Daily Lance Coon, MD   10 mg at 01/21/17 1028  . ondansetron (ZOFRAN)  tablet 4 mg  4 mg Oral Q6H PRN Lance Coon, MD       Or  . ondansetron [facility]) injection 4 mg  4 mg Intravenous Q6H PRN Lance Coon, MD      . pantoprazole (PROTONIX) EC tablet 40 mg  40 mg Oral BID AC Demetrios Loll, MD         Discharge Medications: Medication List             STOP taking these medications            aspirin 81 MG EC tablet    clopidogrel 75 MG tablet Commonly known as:  PLAVIX                       TAKE these medications            acetaminophen 500 MG tablet Commonly known as:  TYLENOL Take 500 mg by mouth every 4 (four) hours as needed for fever.    alum & mag hydroxide-simeth 664-403-47 MG/5ML  suspension Commonly known as:  MAALOX PLUS Take 30 mLs by mouth as needed for indigestion.    amLODipine 10 MG tablet Commonly known as:  NORVASC Take 1 tablet (10 mg total) by mouth daily. What changed:  when to take this    atorvastatin 40 MG tablet Commonly known as:  LIPITOR Take 1 tablet (40 mg total) by mouth daily. What changed:  when to take this    calcium acetate 667 MG capsule Commonly known as:  PHOSLO Take 1,334 mg by mouth 3 (three) times daily.    carvedilol 6.25 MG tablet Commonly known as:  COREG Take 1 tablet (6.25 mg total) by mouth 2 (two) times daily with a meal.    cetirizine 10 MG tablet Commonly known as:  ZYRTEC Take 10 mg by mouth daily.    escitalopram 10 MG tablet Commonly known as:  LEXAPRO Take 10 mg by mouth daily.    gabapentin 300 MG capsule Commonly known as:  NEURONTIN Take 300 mg by mouth at bedtime.    guaifenesin 100 MG/5ML syrup Commonly known as:  ROBITUSSIN Take 200 mg by mouth every 6 (six) hours as needed for cough.    loperamide 2 MG capsule Commonly known as:  IMODIUM Take 2 mg by mouth as needed for diarrhea or loose stools.    losartan 100 MG tablet Commonly known as:  COZAAR Take 1 tablet (100 mg total) by mouth daily. What changed:  when to take this    magnesium hydroxide 400 MG/5ML suspension Commonly known as:  MILK OF MAGNESIA Take 30 mLs by mouth at bedtime as needed for mild constipation.    neomycin-bacitracin-polymyxin ointment Commonly known as:  NEOSPORIN Apply 1 application topically as needed for wound care. apply to eye    ondansetron 4 MG tablet Commonly known as:  ZOFRAN Take 4 mg by mouth every 8 (eight) hours as needed for nausea or vomiting.    pantoprazole 40 MG tablet Commonly known as:  PROTONIX Take 1 tablet (40 mg total) by mouth 2 (two) times daily.    polyethylene glycol packet Commonly known as:  MIRALAX / GLYCOLAX Take 17 g by mouth daily as needed for mild  constipation.    spironolactone 25 MG tablet Commonly known as:  ALDACTONE Take 1 tablet (25 mg total) by mouth daily.    traZODone 50 MG tablet Commonly known as:  DESYREL Take 25 mg by mouth at bedtime.  trolamine salicylate 10 % cream Commonly known as:  ASPERCREME Apply topically 2 (two) times daily as needed for muscle pain.                            Relevant Imaging Results:  Relevant Lab Results:   Additional Information    Zettie Pho, LCSW

## 2017-01-21 NOTE — Progress Notes (Signed)
Discharge instructions reviewed with the patient.  We are waiting on someone from [facility]  to get her

## 2017-01-21 NOTE — Consult Note (Addendum)
Toni Darby, MD [address]  [place], [place] [address]  Main: [phone]  Fax: [phone] Pager: [phone]   Consultation  Referring Provider:     No ref. provider found Primary Care Physician:  Murlean Iba, MD Primary Gastroenterologist:  Dr. Jonathon Bellows         Reason for Consultation:     Symptomatic anemia  Date of Admission:  01/19/2017 Date of Consultation:  01/21/2017         HPI:   Toni [place] is a 71 y.o. female with history of ESRD on hemodialysis, CHF, on aspirin and Plavix, recent admission about a month ago with hemoglobin 6.4 but no active bleeding. She underwent EGD and colonoscopy during that admission, EGD was unremarkable, colonoscopy revealed about 2-2.5cm polypoid nonobstructing mass at the IC valve which was biopsied and path was consistent with ischemic colitis. She presents to the ER on 9/6 with abdominal pain, nausea, vomiting, 2 loose bowel movements, She had melena in the ER per chart review. But, she did not have any melena since admission. Her hemoglobin on admission was 6 and She received 2 units of PRBCs and her hemoglobin responded appropriately it has been stable since then. She had abdominal imaging in the ER and it did not show any acute pathology. She was seen by Dr. Allen Norris in the GI clinic recently.  When I saw her today, she denied having any symptoms. And she wanted to eat. 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  Past Medical History:  Diagnosis Date  . A-fib (HCC)    a. on warfarin  . Anemia   . Carotid artery stenosis    a. ultrasound 03/2015: RICA 02-72% stenosis, LICA less than 53% stenosis  . Chronic diastolic CHF (congestive heart failure) ([place])    a. echo 03/2015: EF 60-65%, normal wall motion, diastolic parameters were c/w  restrictive physiology, indicative of decreased LV diastolic compliance and/or increased LA pressure, mild AS, mild MR, left atrium was severely dilated, RA was severely dilated, PASP was severely increased at 90 mm Hg  . Coronary artery disease, non-occlusive    a. cardiac cath 2013  . ESRD (end stage renal disease) ([place])    On Tue-Thur-Sat dialysis  . H/O ischemic right MCA stroke    a. 03/2015; b. residual left-sided facial droop and slurred speech  . Hyperparathyroidism, secondary renal ([place])   . Hypertension   . Nicotine dependence   . Pulmonary HTN ([place])    as above  . Symptomatic bradycardia    a. history of symptomatic bradycardia; b. felt to be 2/2 metabolic abnormalities & required temp wire but no PPM, resolved with HD    Past Surgical History:  Procedure Laterality Date  . A/V FISTULAGRAM Left 10/25/2016   Procedure: A/V Fistulagram;  Surgeon: Katha Cabal, MD;  Location: [place] [facility];  Service: Cardiovascular;  Laterality: Left;  . COLONOSCOPY WITH PROPOFOL N/A 12/19/2016   Procedure: COLONOSCOPY WITH PROPOFOL;  Surgeon: Jonathon Bellows, MD;  Location: [facility] ENDOSCOPY;  Service: Gastroenterology;  Laterality: N/A;  . ESOPHAGOGASTRODUODENOSCOPY (EGD) WITH PROPOFOL N/A 04/14/2015   Procedure: ESOPHAGOGASTRODUODENOSCOPY (EGD) WITH PROPOFOL;  Surgeon: Lucilla Lame, MD;  Location: [facility] ENDOSCOPY;  Service: Endoscopy;  Laterality: N/A;  . ESOPHAGOGASTRODUODENOSCOPY (EGD) WITH PROPOFOL N/A 07/17/2015   Procedure: ESOPHAGOGASTRODUODENOSCOPY (  EGD) WITH PROPOFOL;  Surgeon: Josefine Class, MD;  Location[facility];  Service: Endoscopy;  Laterality: N/A;  . ESOPHAGOGASTRODUODENOSCOPY (EGD) WITH PROPOFOL N/A 12/19/2016   Procedure: ESOPHAGOGASTRODUODENOSCOPY (EGD) WITH PROPOFOL;  Surgeon: Jonathon Bellows, MD;  Location: [facility] ENDOSCOPY;  Service: Gastroenterology;  Laterality: N/A;  . EUS N/A 09/24/2015   Procedure: UPPER ENDOSCOPIC ULTRASOUND (EUS) LINEAR;  Surgeon: Jola Schmidt,  MD;  Location: [facility] ENDOSCOPY;  Service: Endoscopy;  Laterality: N/A;  . LOWER EXTREMITY ANGIOGRAPHY Right 06/09/2016   Procedure: Lower Extremity Angiography;  Surgeon: Algernon Huxley, MD;  Location: [place] [facility];  Service: Cardiovascular;  Laterality: Right;  . PERIPHERAL VASCULAR CATHETERIZATION N/A 02/23/2015   Procedure: A/V Shuntogram/Fistulagram;  Surgeon: Algernon Huxley, MD;  Location: [place] [facility];  Service: Cardiovascular;  Laterality: N/A;  . PERIPHERAL VASCULAR CATHETERIZATION N/A 02/23/2015   Procedure: A/V Shunt Intervention;  Surgeon: Algernon Huxley, MD;  Location: [place] [facility];  Service: Cardiovascular;  Laterality: N/A;    Prior to Admission medications   Medication Sig Start Date End Date Taking? Authorizing Provider  acetaminophen (TYLENOL) 500 MG tablet Take 500 mg by mouth every 4 (four) hours as needed for fever.   Yes [provider]  alum & mag hydroxide-simeth (MAALOX PLUS) 400-400-40 MG/5ML suspension Take 30 mLs by mouth as needed for indigestion.   Yes [provider]  amLODipine (NORVASC) 10 MG tablet Take 1 tablet (10 mg total) by mouth daily. Patient taking differently: Take 10 mg by mouth at bedtime.  09/19/15  Yes Carrie Mew, MD  aspirin EC 81 MG EC tablet Take 1 tablet (81 mg total) by mouth daily. 06/10/16  Yes Dew, Erskine Squibb, MD  atorvastatin (LIPITOR) 40 MG tablet Take 1 tablet (40 mg total) by mouth daily. Patient taking differently: Take 40 mg by mouth at bedtime.  09/19/15  Yes Carrie Mew, MD  calcium acetate (PHOSLO) 667 MG capsule Take 1,334 mg by mouth 3 (three) times daily.   Yes [provider]  carvedilol (COREG) 6.25 MG tablet Take 1 tablet (6.25 mg total) by mouth 2 (two) times daily with a meal. 09/19/15  Yes Carrie Mew, MD  cetirizine (ZYRTEC) 10 MG tablet Take 10 mg by mouth daily.   Yes [provider]  clopidogrel (PLAVIX) 75 MG tablet Take 1 tablet (75 mg total) by mouth daily.  06/10/16  Yes Algernon Huxley, MD  escitalopram (LEXAPRO) 10 MG tablet Take 10 mg by mouth daily.   Yes [provider]  gabapentin (NEURONTIN) 300 MG capsule Take 300 mg by mouth at bedtime.   Yes [provider]  guaifenesin (ROBITUSSIN) 100 MG/5ML syrup Take 200 mg by mouth every 6 (six) hours as needed for cough.   Yes [provider]  loperamide (IMODIUM) 2 MG capsule Take 2 mg by mouth as needed for diarrhea or loose stools.   Yes [provider]  losartan (COZAAR) 100 MG tablet Take 1 tablet (100 mg total) by mouth daily. Patient taking differently: Take 100 mg by mouth at bedtime.  09/19/15  Yes Carrie Mew, MD  magnesium hydroxide (MILK OF MAGNESIA) 400 MG/5ML suspension Take 30 mLs by mouth at bedtime as needed for mild constipation.   Yes [provider]  neomycin-bacitracin-polymyxin (NEOSPORIN) ointment Apply 1 application topically as needed for wound care. apply to eye   Yes [provider]  ondansetron (ZOFRAN) 4 MG tablet Take 4 mg by mouth every 8 (eight) hours as needed for  nausea or vomiting.   Yes [provider]  pantoprazole (PROTONIX) 40 MG tablet Take 1 tablet (40 mg total) by mouth 2 (two) times daily. 09/19/15  Yes Carrie Mew, MD  polyethylene glycol [facility] / Floria Raveling) packet Take 17 g by mouth daily as needed for mild constipation. 09/19/15  Yes Carrie Mew, MD  spironolactone (ALDACTONE) 25 MG tablet Take 1 tablet (25 mg total) by mouth daily. 12/20/16  Yes Fritzi Mandes, MD  traZODone (DESYREL) 50 MG tablet Take 25 mg by mouth at bedtime.  05/18/16  Yes [provider]  trolamine salicylate (ASPERCREME) 10 % cream Apply topically 2 (two) times daily as needed for muscle pain. 09/19/15  Yes Carrie Mew, MD    Family History  Problem Relation Age of Onset  . CAD Father   . Dementia Mother   . Lupus Sister      Social History  Substance Use Topics  . Smoking status: Former Smoker     Packs/day: 0.25    Years: 30.00    Types: Cigarettes    Quit date: 11/20/2013  . Smokeless tobacco: Never Used  . Alcohol use No    Allergies as of 01/19/2017  . (No Known Allergies)    Review of Systems:    All systems reviewed and negative except where noted in HPI.   Physical Exam:  Vital signs in last 24 hours: Temp:  [97.4 F (36.3 C)-98.4 F (36.9 C)] 98.3 F (36.8 C) (09/08 1207) Pulse Rate:  [49-64] 55 (09/08 1207) Resp:  [13-21] 18 (09/08 1207) BP: (119-170)/(48-94) 146/52 (09/08 1207) SpO2:  [90 %-100 %] 90 % (09/08 1207) Weight:  [163 lb 5.8 oz (74.1 kg)] 163 lb 5.8 oz (74.1 kg) (09/07 1540) Last BM Date: 01/20/17 General:   Irritable, NAD Head:  Normocephalic and atraumatic. Eyes:   No icterus.   Conjunctiva pink. PERRLA. Ears:  Normal auditory acuity. Neck:  Supple; no masses or thyroidomegaly Lungs: Respirations even and unlabored. Lungs clear to auscultation bilaterally.   No wheezes, crackles, or rhonchi.  Heart:  Regular rate and rhythm;  Without murmur, clicks, rubs or gallops Abdomen:  Soft, nondistended, nontender. Normal bowel sounds. No appreciable masses or hepatomegaly.  No rebound or guarding.  Rectal:  Not performed. Msk:  Symmetrical without gross deformities.  Extremities:  Without edema, cyanosis or clubbing. Neurologic:  Alert and oriented x3;  grossly normal neurologically. Skin:  Intact without significant lesions or rashes.   LAB RESULTS:  Recent Labs  01/19/17 2008 01/20/17 0833 01/20/17 1502 01/20/17 2304 01/21/17 0725  WBC 5.8 5.9  --   --   --   HGB 6.0* 8.9* 8.7* 8.5* 8.7*  HCT 18.4* 26.1*  --   --   --   PLT 166 161  --   --   --    BMET  Recent Labs  01/19/17 2008 01/20/17 0833  NA 138 137  K 3.9 4.2  CL 96* 95*  CO2 30 29  GLUCOSE 111* 82  BUN 37* 40*  CREATININE 5.57* 6.15*  CALCIUM 7.7* 8.2*   LFT  Recent Labs  01/19/17 2008  PROT 6.2*  ALBUMIN 3.3*  AST 16  ALT 10*  ALKPHOS 537*  BILITOT 0.4    PT/INR No results for input(s): LABPROT, INR in the last 72 hours.  STUDIES: Ct Abdomen Pelvis W Contrast  Result Date: 01/19/2017 CLINICAL DATA:  Generalized abdominal pain, nausea, and vomiting since yesterday after dialysis. Diverticulitis is suspected. EXAM: CT ABDOMEN AND PELVIS  WITH CONTRAST TECHNIQUE: Multidetector CT imaging of the abdomen and pelvis was performed using the standard protocol following bolus administration of intravenous contrast. CONTRAST:  154mL ISOVUE-300 IOPAMIDOL (ISOVUE-300) INJECTION 61% COMPARISON:  12/17/2016 FINDINGS: Lower chest: Motion artifact limits evaluation. Probable atelectasis in the lung bases. Mild cardiac enlargement. Coronary artery and mitral valve calcifications. Hepatobiliary: Diffuse fatty infiltration of the liver. No focal liver abnormality is seen. Status post cholecystectomy. No biliary dilatation. Pancreas: Unremarkable. No pancreatic ductal dilatation or surrounding inflammatory changes. Spleen: Normal in size without focal abnormality. Adrenals/Urinary Tract: No adrenal gland nodules. Kidneys are atrophic. Parenchymal calcifications bilaterally likely representing chronic dystrophic calcification. Extensive renal artery calcifications. No hydronephrosis. Bladder is decompressed. Stomach/Bowel: Stomach, small bowel, and colon are not abnormally distended. Prominent stool in the rectum. Colonic diverticulosis. No inflammatory changes. Appendix is normal. Vascular/Lymphatic: Mild prominence of lymph nodes in the retrocrural space, retroperitoneum, and mesenteric, likely reactive. No pathologic lymphadenopathy. Extensive vascular calcification throughout the abdominal aorta and all major branch vessels. Vessels are patent but areas of significant stenosis are likely. Mesenteric artery calcification could contribute to intestinal angina. No aneurysms. Reproductive: Status post hysterectomy. No adnexal masses. Other: No free air or free fluid in the  abdomen. There is a anterior abdominal wall hernia above the umbilicus containing fat. Mild stranding in the fat could indicate fat necrosis. Appearance is similar to previous study. Musculoskeletal: Diffuse bone demineralization with trabecular coarsening and peripheral sclerosis likely representing renal osteodystrophy changes. No destructive bone lesions. IMPRESSION: 1. No evidence of bowel obstruction or inflammation. Diverticulosis of the colon without evidence of diverticulitis. 2. Diffuse fatty infiltration of the liver. 3. Diffuse vascular calcification. Major vessels appear patent but areas of significant stenosis are not excluded. 4. Bilateral renal atrophy and renal parenchymal calcification consistent with history of chronic renal disease. 5. Bone changes consistent with renal osteodystrophy. 6. Cardiac enlargement. Aortic Atherosclerosis (ICD10-I70.0). Electronically Signed   By: Lucienne Capers M.D.   On: 01/19/2017 21:10      Impression / Plan:   Ardie Dragoo [place] is a 71 y.o. y/o female with CHF, On aspirin and Plavix , ESRD, chronic normocytic anemia, admitted with acute on chronic anemia. Her workup for anemia so far including EGD was unrevealing and colonoscopy showed incidental large polypoid lesion on the IC valve which was consistent with ischemic colitis. She does not have iron deficiency or B12 deficiency. Her H. pylori serology was negative in 2015. There is no evidence of active GI bleed. I recommended push enteroscopy and a repeat colonoscopy to evaluate that lesion and also the terminal ileum, but patient refused to undergo any of these procedures. I also recommend VCE and she refused to swallow the capsule either.   - Follow-up with Dr Jonathon Bellows in GI clinic - Check serum erythropoietin levels - Please call back GI if patient agreeable with our recommendations   Thank you for involving me in the care of this patient.      LOS: 2 days   Sherri Sear, MD  01/21/2017, 1:28  PM   Note: This dictation was prepared with Dragon dictation along with smaller phrase technology. Any transcriptional errors that result from this process are unintentional.

## 2017-01-21 NOTE — Evaluation (Signed)
Physical Therapy Evaluation Patient Details Name: Toni [place] MRN: [id number] DOB: Jul 25, 1945 Today's Date: 01/21/2017   History of Present Illness  Toni [place]  is a 71 y.o. female who presents with abdominal pain with nausea.here in the ED the patient was found to have melanotic stool. Initially her stool was normal, but turned melanotic while in the ED. Her hemoglobin is 6 today, whereas her baseline is 9. She had transfusion ordered in the ED, and hospitalists were called for admission and workup for GI bleed.  Clinical Impression  Pt presents to PT with generalized LE weakness and decreased endurance.  Pt appears to be at baseline functional mobility and strength.  Pt had HHPT that ended about 3-4 months ago and still has HEP at home.  Educated pt on exercise to increase LE strength and to continue with HEP.  Pt voiced understanding of recommendations.  Pt has all needed equipment at ALF.    Follow Up Recommendations No PT follow up    Equipment Recommendations  None recommended by PT    Recommendations for Other Services       Precautions / Restrictions Precautions Precautions: Fall Precaution Comments: MOD Restrictions Weight Bearing Restrictions: No      Mobility  Bed Mobility Overal bed mobility: Modified Independent       General bed mobility comments: Supine to sit with Mod I, rising easily to seated EOB.  Transfers Overall transfer level: Modified independent Equipment used: Rolling walker (2 wheeled)   General transfer comment: Rises with some effort using B UE's to assist.  Ambulation/Gait Ambulation/Gait assistance: Modified independent (Device/Increase time) Ambulation Distance (Feet): 80 Feet Assistive device: Rolling walker (2 wheeled) Gait Pattern/deviations: Narrow base of support     General Gait Details: Noticeable lean on RW through UE's, steady sept through gait pattern limited by decreased endurance and pain in knees.  Stairs     Wheelchair Mobility    Modified Rankin (Stroke Patients Only)      Balance Overall balance assessment: Modified Independent       Pertinent Vitals/Pain Pain Assessment: No/denies pain    Home Living Family/patient expects to be discharged to:: Assisted living   Home Equipment: Walker - 2 wheels;Wheelchair - Education officer, community - power      Prior Function Level of Independence: Independent with assistive device(s) ([place] and dresses self, assist with medication administatio)    Comments: Ambulation limited by knee pain, walks to dining room when able or uses wheelchair as needed.  Reports no falls "in a [place] time"     Hand Dominance        Extremity/Trunk Assessment   Upper Extremity Assessment Upper Extremity Assessment: Overall WFL for tasks assessed    Lower Extremity Assessment Lower Extremity Assessment: Overall WFL for tasks assessed    Cervical / Trunk Assessment Cervical / Trunk Assessment: Normal  Communication   Communication: No difficulties  Cognition Arousal/Alertness: Awake/alert Behavior During Therapy: WFL for tasks assessed/performed Overall Cognitive Status: Within Functional Limits for tasks assessed     General Comments: Concernd about eating due to currently on liquid diet.      General Comments      Exercises Other Exercises Other Exercises: Instructed pt in SLR's for strengthening of quads.  Pt able to perform 10 reps each leg with CGA.   Assessment/Plan    PT Assessment Patent does not need any further PT services  PT Problem List         PT Treatment Interventions  PT Goals (Current goals can be found in the Care Plan section)  Acute Rehab PT Goals Patient Stated Goal: To eat and go home. PT Goal Formulation: With patient    Frequency  D/C servoces   Barriers to discharge  None      Co-evaluation  NA        End of Session Equipment Utilized During Treatment: Gait belt Activity Tolerance: Patient  tolerated treatment well Patient left: in chair;with call bell/phone within reach Nurse Communication: Mobility status PT Visit Diagnosis: Muscle weakness (generalized) (M62.81)    Time: 9733-1250 PT Time Calculation (min) (ACUTE ONLY): 23 min   Charges:     PT Treatments $Therapeutic Activity: 8-22 mins   PT G Codes:         Jerritt Cardoza A Justiss, PT 01/21/2017, 10:57 AM

## 2017-01-21 NOTE — Discharge Summary (Signed)
[facility] NAME: Toni Carlson    MR#:  [id number]  DATE OF BIRTH:  Dec 09, 1945  DATE OF ADMISSION:  01/19/2017   ADMITTING PHYSICIAN: Lance Coon, MD  DATE OF DISCHARGE: 01/21/2017  PRIMARY CARE PHYSICIAN: Murlean Iba, MD   ADMISSION DIAGNOSIS:  Generalized abdominal pain [R10.84] UGIB (upper gastrointestinal bleed) [K92.2] Atherosclerosis [I70.90] DISCHARGE DIAGNOSIS:  Principal Problem:   Acute GI bleeding Active Problems:   Atrial fibrillation (HCC)   Essential hypertension   End stage renal disease on dialysis [facility])   Atherosclerotic peripheral vascular disease ([place])   GI bleed  SECONDARY DIAGNOSIS:   Past Medical History:  Diagnosis Date  . A-fib (HCC)    a. on warfarin  . Anemia   . Carotid artery stenosis    a. ultrasound 03/2015: RICA 67-59% stenosis, LICA less than 16% stenosis  . Chronic diastolic CHF (congestive heart failure) ([place])    a. echo 03/2015: EF 60-65%, normal wall motion, diastolic parameters were c/w restrictive physiology, indicative of decreased LV diastolic compliance and/or increased LA pressure, mild AS, mild MR, left atrium was severely dilated, RA was severely dilated, PASP was severely increased at 90 mm Hg  . Coronary artery disease, non-occlusive    a. cardiac cath 2013  . ESRD (end stage renal disease) ([place])    On Tue-Thur-Sat dialysis  . H/O ischemic right MCA stroke    a. 03/2015; b. residual left-sided facial droop and slurred speech  . Hyperparathyroidism, secondary renal ([place])   . Hypertension   . Nicotine dependence   . Pulmonary HTN ([place])    as above  . Symptomatic bradycardia    a. history of symptomatic bradycardia; b. felt to be 2/2 metabolic abnormalities & required temp wire but no PPM, resolved with HD   HOSPITAL COURSE:   *Acute GI bleeding She was on IV PPI and Liquid diet. Changed po protonix. Colonoscopy tomorrow per Dr. Marius Ditch. But the patient refused  colonoscopy. The patient has no active bleeding, hemoglobin is stable. Resume diet. Follow-up GI as outpatient.  anemia of chronic disease and due to acute blood loss. (Hb was 6.0) S/P 2 units PRBC transfusion. Hb is up and stable at 8.7.  *Atrial fibrillation ([place]) - continue home meds, hold ASA and Plavix due to bleed. *Essential hypertension - continue home medications *End stage renal disease on dialysis ([place]) - HD as scheduled. *Atherosclerotic peripheral vascular disease ([place]) - continue home meds  I discussed with DR. Vanga. DISCHARGE CONDITIONS:  Stable, discharge to SNF today. CONSULTS OBTAINED:  Treatment Team:  Lin Landsman, MD DRUG ALLERGIES:  No Known Allergies DISCHARGE MEDICATIONS:   Allergies as of 01/21/2017   No Known Allergies     Medication List    STOP taking these medications   aspirin 81 MG EC tablet   clopidogrel 75 MG tablet Commonly known as:  PLAVIX     TAKE these medications   acetaminophen 500 MG tablet Commonly known as:  TYLENOL Take 500 mg by mouth every 4 (four) hours as needed for fever.   alum & mag hydroxide-simeth 384-665-99 MG/5ML suspension Commonly known as:  MAALOX PLUS Take 30 mLs by mouth as needed for indigestion.   amLODipine 10 MG tablet Commonly known as:  NORVASC Take 1 tablet (10 mg total) by mouth daily. What changed:  when to take this   atorvastatin 40 MG tablet Commonly known as:  LIPITOR Take 1 tablet (40 mg total) by mouth  daily. What changed:  when to take this   calcium acetate 667 MG capsule Commonly known as:  PHOSLO Take 1,334 mg by mouth 3 (three) times daily.   carvedilol 6.25 MG tablet Commonly known as:  COREG Take 1 tablet (6.25 mg total) by mouth 2 (two) times daily with a meal.   cetirizine 10 MG tablet Commonly known as:  ZYRTEC Take 10 mg by mouth daily.   escitalopram 10 MG tablet Commonly known as:  LEXAPRO Take 10 mg by mouth daily.   gabapentin 300 MG  capsule Commonly known as:  NEURONTIN Take 300 mg by mouth at bedtime.   guaifenesin 100 MG/5ML syrup Commonly known as:  ROBITUSSIN Take 200 mg by mouth every 6 (six) hours as needed for cough.   loperamide 2 MG capsule Commonly known as:  IMODIUM Take 2 mg by mouth as needed for diarrhea or loose stools.   losartan 100 MG tablet Commonly known as:  COZAAR Take 1 tablet (100 mg total) by mouth daily. What changed:  when to take this   magnesium hydroxide 400 MG/5ML suspension Commonly known as:  MILK OF MAGNESIA Take 30 mLs by mouth at bedtime as needed for mild constipation.   neomycin-bacitracin-polymyxin ointment Commonly known as:  NEOSPORIN Apply 1 application topically as needed for wound care. apply to eye   ondansetron 4 MG tablet Commonly known as:  ZOFRAN Take 4 mg by mouth every 8 (eight) hours as needed for nausea or vomiting.   pantoprazole 40 MG tablet Commonly known as:  PROTONIX Take 1 tablet (40 mg total) by mouth 2 (two) times daily.   polyethylene glycol packet Commonly known as:  MIRALAX / GLYCOLAX Take 17 g by mouth daily as needed for mild constipation.   spironolactone 25 MG tablet Commonly known as:  ALDACTONE Take 1 tablet (25 mg total) by mouth daily.   traZODone 50 MG tablet Commonly known as:  DESYREL Take 25 mg by mouth at bedtime.   trolamine salicylate 10 % cream Commonly known as:  ASPERCREME Apply topically 2 (two) times daily as needed for muscle pain.            Discharge Care Instructions        Start     Ordered   01/21/17 0000  Increase activity slowly     01/21/17 1234   01/21/17 0000  Diet - low sodium heart healthy     01/21/17 1234       DISCHARGE INSTRUCTIONS:  See AVS.  If you experience worsening of your admission symptoms, develop shortness of breath, life threatening emergency, suicidal or homicidal thoughts you must seek medical attention immediately by calling 911 or calling your MD immediately  if  symptoms less severe.  You Must read complete instructions/literature along with all the possible adverse reactions/side effects for all the Medicines you take and that have been prescribed to you. Take any new Medicines after you have completely understood and accpet all the possible adverse reactions/side effects.   Please note  You were cared for by a hospitalist during your hospital stay. If you have any questions about your discharge medications or the care you received while you were in the hospital after you are discharged, you can call the unit and asked to speak with the hospitalist on call if the hospitalist that took care of you is not available. Once you are discharged, your primary care physician will handle any further medical issues. Please note that NO REFILLS for  any discharge medications will be authorized once you are discharged, as it is imperative that you return to your primary care physician (or establish a relationship with a primary care physician if you do not have one) for your aftercare needs so that they can reassess your need for medications and monitor your lab values.    On the day of Discharge:  VITAL SIGNS:  Blood pressure (!) 146/52, pulse (!) 55, temperature 98.3 F (36.8 C), temperature source Oral, resp. rate 18, height 5\' 6"  (1.676 m), weight 163 lb 5.8 oz (74.1 kg), SpO2 90 %. PHYSICAL EXAMINATION:  GENERAL:  71 y.o.-year-old patient lying in the bed with no acute distress.  EYES: Pupils equal, round, reactive to light and accommodation. No scleral icterus. Extraocular muscles intact.  HEENT: Head atraumatic, normocephalic. Oropharynx and nasopharynx clear.  NECK:  Supple, no jugular venous distention. No thyroid enlargement, no tenderness.  LUNGS: Normal breath sounds bilaterally, no wheezing, rales,rhonchi or crepitation. No use of accessory muscles of respiration.  CARDIOVASCULAR: S1, S2 normal. No murmurs, rubs, or gallops.  ABDOMEN: Soft, non-tender,  non-distended. Bowel sounds present. No organomegaly or mass.  EXTREMITIES: No pedal edema, cyanosis, or clubbing.  NEUROLOGIC: Cranial nerves II through XII are intact. Muscle strength 5/5 in all extremities. Sensation intact. Gait not checked.  PSYCHIATRIC: The patient is alert and oriented x 3.  SKIN: No obvious rash, lesion, or ulcer.  DATA REVIEW:   CBC  Recent Labs Lab 01/20/17 0833  01/21/17 0725  WBC 5.9  --   --   HGB 8.9*  < > 8.7*  HCT 26.1*  --   --   PLT 161  --   --   < > = values in this interval not displayed.  Chemistries   Recent Labs Lab 01/19/17 2008 01/20/17 0833  NA 138 137  K 3.9 4.2  CL 96* 95*  CO2 30 29  GLUCOSE 111* 82  BUN 37* 40*  CREATININE 5.57* 6.15*  CALCIUM 7.7* 8.2*  AST 16  --   ALT 10*  --   ALKPHOS 537*  --   BILITOT 0.4  --      Microbiology Results  Results for orders placed or performed during the hospital encounter of 01/19/17  MRSA PCR Screening     Status: None   Collection Time: 01/20/17 12:36 AM  Result Value Ref Range Status   MRSA by PCR NEGATIVE NEGATIVE Final    Comment:        The GeneXpert MRSA Assay (FDA approved for NASAL specimens only), is one component of a comprehensive MRSA colonization surveillance program. It is not intended to diagnose MRSA infection nor to guide or monitor treatment for MRSA infections.     RADIOLOGY:  No results found.   Management plans discussed with the patient, family and they are in agreement.  CODE STATUS: Full Code   TOTAL TIME TAKING CARE OF THIS PATIENT: 36 minutes.    Demetrios Loll M.D on 01/21/2017 at 1:36 PM  Between 7am to 6pm - Pager - [phone]  After 6pm go to [email or web address] - Proofreader  [facility]  Office  [phone]  CC: Primary care physician; Murlean Iba, MD   Note: This dictation was prepared with Dragon dictation along with smaller phrase technology. Any transcriptional errors that result from  this process are unintentional.

## 2017-01-21 NOTE — Progress Notes (Signed)
[facility]  ROUNDING NOTE   Subjective:   Hemodialysis treatment yesterday. Tolerated treatment well. UF of 1 liter  Hemoglobin remains stable.   Objective:  Vital signs in last 24 hours:  Temp:  [98 F (36.7 C)-98.4 F (36.9 C)] 98.3 F (36.8 C) (09/08 1207) Pulse Rate:  [49-64] 55 (09/08 1207) Resp:  [13-21] 18 (09/08 1207) BP: (119-170)/(48-94) 146/52 (09/08 1207) SpO2:  [90 %-100 %] 90 % (09/08 1207) Weight:  [74.1 kg (163 lb 5.8 oz)] 74.1 kg (163 lb 5.8 oz) (09/07 1540)  Weight change: 5.153 kg (11 lb 5.8 oz) Filed Weights   01/19/17 2010 01/20/17 0011 01/20/17 1540  Weight: 68.9 kg (152 lb) 73.9 kg (163 lb) 74.1 kg (163 lb 5.8 oz)    Intake/Output: I/O last 3 completed shifts: In: 62 [P.O.:240; I.V.:540; Blood:718; IV Piggyback:100] Out: 1000 [Other:1000]   Intake/Output this shift:  No intake/output data recorded.  Physical Exam: General: NAD, laying in bed  Head: Normocephalic, atraumatic. Moist oral mucosal membranes  Eyes: Anicteric, PERRL  Neck: Supple, trachea midline  Lungs:  Clear to auscultation  Heart: Regular rate and rhythm  Abdomen:  Soft, nontender,   Extremities:  no  peripheral edema.  Neurologic: Nonfocal, moving all four extremities  Skin: No lesions  Access: Left AVF    Basic Metabolic Panel:  Recent Labs Lab 01/19/17 2008 01/20/17 0833  NA 138 137  K 3.9 4.2  CL 96* 95*  CO2 30 29  GLUCOSE 111* 82  BUN 37* 40*  CREATININE 5.57* 6.15*  CALCIUM 7.7* 8.2*    Liver Function Tests:  Recent Labs Lab 01/19/17 2008  AST 16  ALT 10*  ALKPHOS 537*  BILITOT 0.4  PROT 6.2*  ALBUMIN 3.3*    Recent Labs Lab 01/19/17 2008  LIPASE 23   No results for input(s): AMMONIA in the last 168 hours.  CBC:  Recent Labs Lab 01/19/17 2008 01/20/17 0833 01/20/17 1502 01/20/17 2304 01/21/17 0725  WBC 5.8 5.9  --   --   --   NEUTROABS 4.3  --   --   --   --   HGB 6.0* 8.9* 8.7* 8.5* 8.7*  HCT 18.4* 26.1*  --    --   --   MCV 94.9 89.5  --   --   --   PLT 166 161  --   --   --     Cardiac Enzymes: No results for input(s): CKTOTAL, CKMB, CKMBINDEX, TROPONINI in the last 168 hours.  BNP: Invalid input(s): POCBNP  CBG: No results for input(s): GLUCAP in the last 168 hours.  Microbiology: Results for orders placed or performed during the hospital encounter of 01/19/17  MRSA PCR Screening     Status: None   Collection Time: 01/20/17 12:36 AM  Result Value Ref Range Status   MRSA by PCR NEGATIVE NEGATIVE Final    Comment:        The GeneXpert MRSA Assay (FDA approved for NASAL specimens only), is one component of a comprehensive MRSA colonization surveillance program. It is not intended to diagnose MRSA infection nor to guide or monitor treatment for MRSA infections.     Coagulation Studies: No results for input(s): LABPROT, INR in the last 72 hours.  Urinalysis: No results for input(s): COLORURINE, LABSPEC, PHURINE, GLUCOSEU, HGBUR, BILIRUBINUR, KETONESUR, PROTEINUR, UROBILINOGEN, NITRITE, LEUKOCYTESUR in the last 72 hours.  Invalid input(s): APPERANCEUR    Imaging: Ct Abdomen Pelvis W Contrast  Result Date: 01/19/2017 CLINICAL DATA:  Generalized abdominal pain, nausea, and vomiting since yesterday after dialysis. Diverticulitis is suspected. EXAM: CT ABDOMEN AND PELVIS WITH CONTRAST TECHNIQUE: Multidetector CT imaging of the abdomen and pelvis was performed using the standard protocol following bolus administration of intravenous contrast. CONTRAST:  120mL ISOVUE-300 IOPAMIDOL (ISOVUE-300) INJECTION 61% COMPARISON:  12/17/2016 FINDINGS: Lower chest: Motion artifact limits evaluation. Probable atelectasis in the lung bases. Mild cardiac enlargement. Coronary artery and mitral valve calcifications. Hepatobiliary: Diffuse fatty infiltration of the liver. No focal liver abnormality is seen. Status post cholecystectomy. No biliary dilatation. Pancreas: Unremarkable. No pancreatic ductal  dilatation or surrounding inflammatory changes. Spleen: Normal in size without focal abnormality. Adrenals/Urinary Tract: No adrenal gland nodules. Kidneys are atrophic. Parenchymal calcifications bilaterally likely representing chronic dystrophic calcification. Extensive renal artery calcifications. No hydronephrosis. Bladder is decompressed. Stomach/Bowel: Stomach, small bowel, and colon are not abnormally distended. Prominent stool in the rectum. Colonic diverticulosis. No inflammatory changes. Appendix is normal. Vascular/Lymphatic: Mild prominence of lymph nodes in the retrocrural space, retroperitoneum, and mesenteric, likely reactive. No pathologic lymphadenopathy. Extensive vascular calcification throughout the abdominal aorta and all major branch vessels. Vessels are patent but areas of significant stenosis are likely. Mesenteric artery calcification could contribute to intestinal angina. No aneurysms. Reproductive: Status post hysterectomy. No adnexal masses. Other: No free air or free fluid in the abdomen. There is a anterior abdominal wall hernia above the umbilicus containing fat. Mild stranding in the fat could indicate fat necrosis. Appearance is similar to previous study. Musculoskeletal: Diffuse bone demineralization with trabecular coarsening and peripheral sclerosis likely representing renal osteodystrophy changes. No destructive bone lesions. IMPRESSION: 1. No evidence of bowel obstruction or inflammation. Diverticulosis of the colon without evidence of diverticulitis. 2. Diffuse fatty infiltration of the liver. 3. Diffuse vascular calcification. Major vessels appear patent but areas of significant stenosis are not excluded. 4. Bilateral renal atrophy and renal parenchymal calcification consistent with history of chronic renal disease. 5. Bone changes consistent with renal osteodystrophy. 6. Cardiac enlargement. Aortic Atherosclerosis (ICD10-I70.0). Electronically Signed   By: Lucienne Capers  M.D.   On: 01/19/2017 21:10     Medications:    . calcium acetate  1,334 mg Oral TID  . [START ON 01/23/2017] epoetin (EPOGEN/PROCRIT) injection  10,000 Units Intravenous Q M,W,F-HD  . escitalopram  10 mg Oral Daily  . pantoprazole  40 mg Oral BID AC   acetaminophen **OR** acetaminophen, ondansetron **OR** ondansetron (ZOFRAN) IV  Assessment/ Plan:  Toni Carlson is a 71 y.o. 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   [place]   1. End Stage Renal Disease: on hemodialysis. MWF. Hemodialysis yesterday. Tolerated treatment well.   2. Anemia of chronic kidney disease: with GI bleed. Hemoglobin 6 on admission.  Status post 2 units PRBC transfusion - Continue epo with hemodialysis.  - Appreciate GI input.   3. Secondary Hyperparathyroidism: PTH on 8/13 at 3636. On etelcalcitide and hectorol as outpatient  - calcium acetate binders  4. Hypertension: blood pressure is at goal   LOS: 2 Jeannett Dekoning 9/8/20181:32 PM

## 2017-01-22 SURGERY — COLONOSCOPY
Anesthesia: General

## 2017-01-23 ENCOUNTER — Ambulatory Visit: Payer: Medicare Other | Admitting: Gastroenterology

## 2017-01-23 LAB — ERYTHROPOIETIN: ERYTHROPOIETIN: 57.1 m[IU]/mL — AB (ref 2.6–18.5)

## 2017-02-09 ENCOUNTER — Encounter: Payer: Self-pay | Admitting: Gastroenterology

## 2017-02-09 ENCOUNTER — Ambulatory Visit: Payer: Medicare Other | Admitting: Gastroenterology

## 2017-02-27 ENCOUNTER — Other Ambulatory Visit (INDEPENDENT_AMBULATORY_CARE_PROVIDER_SITE_OTHER): Payer: Self-pay | Admitting: Vascular Surgery

## 2017-02-27 DIAGNOSIS — I7090 Unspecified atherosclerosis: Secondary | ICD-10-CM

## 2017-02-28 ENCOUNTER — Encounter (INDEPENDENT_AMBULATORY_CARE_PROVIDER_SITE_OTHER): Payer: Self-pay

## 2017-02-28 ENCOUNTER — Ambulatory Visit (INDEPENDENT_AMBULATORY_CARE_PROVIDER_SITE_OTHER): Payer: Medicare Other

## 2017-02-28 ENCOUNTER — Ambulatory Visit (INDEPENDENT_AMBULATORY_CARE_PROVIDER_SITE_OTHER): Payer: Medicare Other | Admitting: Vascular Surgery

## 2017-03-07 ENCOUNTER — Encounter (INDEPENDENT_AMBULATORY_CARE_PROVIDER_SITE_OTHER): Payer: Medicare Other

## 2017-03-07 ENCOUNTER — Ambulatory Visit (INDEPENDENT_AMBULATORY_CARE_PROVIDER_SITE_OTHER): Payer: Medicare Other | Admitting: Vascular Surgery

## 2017-03-16 ENCOUNTER — Other Ambulatory Visit (INDEPENDENT_AMBULATORY_CARE_PROVIDER_SITE_OTHER): Payer: Self-pay | Admitting: Vascular Surgery

## 2017-03-16 ENCOUNTER — Encounter (INDEPENDENT_AMBULATORY_CARE_PROVIDER_SITE_OTHER): Payer: Self-pay | Admitting: Vascular Surgery

## 2017-03-16 ENCOUNTER — Encounter (INDEPENDENT_AMBULATORY_CARE_PROVIDER_SITE_OTHER): Payer: Self-pay

## 2017-03-16 ENCOUNTER — Ambulatory Visit (INDEPENDENT_AMBULATORY_CARE_PROVIDER_SITE_OTHER): Payer: Medicare Other

## 2017-03-16 ENCOUNTER — Ambulatory Visit (INDEPENDENT_AMBULATORY_CARE_PROVIDER_SITE_OTHER): Payer: Medicare Other | Admitting: Vascular Surgery

## 2017-03-16 VITALS — BP 147/70 | HR 50 | Resp 16 | Ht 70.0 in | Wt 150.0 lb

## 2017-03-16 DIAGNOSIS — I700 Atherosclerosis of aorta: Secondary | ICD-10-CM

## 2017-03-16 DIAGNOSIS — I70209 Unspecified atherosclerosis of native arteries of extremities, unspecified extremity: Secondary | ICD-10-CM

## 2017-03-16 DIAGNOSIS — K551 Chronic vascular disorders of intestine: Secondary | ICD-10-CM | POA: Diagnosis not present

## 2017-03-16 DIAGNOSIS — K922 Gastrointestinal hemorrhage, unspecified: Secondary | ICD-10-CM | POA: Diagnosis not present

## 2017-03-16 DIAGNOSIS — N186 End stage renal disease: Secondary | ICD-10-CM | POA: Diagnosis not present

## 2017-03-16 DIAGNOSIS — I10 Essential (primary) hypertension: Secondary | ICD-10-CM

## 2017-03-16 DIAGNOSIS — E785 Hyperlipidemia, unspecified: Secondary | ICD-10-CM | POA: Diagnosis not present

## 2017-03-16 DIAGNOSIS — Z992 Dependence on renal dialysis: Secondary | ICD-10-CM | POA: Diagnosis not present

## 2017-03-16 NOTE — Progress Notes (Signed)
Subjective:    Patient ID: Toni Carlson, female    DOB: 31-Aug-1945, 71 y.o.   MRN: [id number] Chief Complaint  Patient presents with  . Follow-up    Mesentric   Patient presents at the request of Dr. Holley Raring for evaluation of mesenteric artery stenosis. Patient underwent a CAT scan for evaluation of generalized abdominal pain, nausea and vomiting on 01/19/2017. The CT was notable for "extensive vascular calcification throughout the abdominal aorta and all major branch vessels. Vessels are patent but areas of significant stenosis are likely. Mesenteric artery calcification could contribute to intestinal angina no aneurysms". Patient continues to experience intermittent abdominal pain, intermittent postprandial pain, intermittent nausea. She denies any changes to her bowel habits. The patient underwent a mesenteric artery duplex exam which was notable for decreased visualization of the abdominal vasculature due to overlying bowel gas patterns and atherosclerotic calcifications. Doppler velocities suggesting greater than 70% stenosis of the celiac and superior mesenteric arteries based on limited visualization.    Review of Systems  Constitutional: Negative.   HENT: Negative.   Eyes: Negative.   Respiratory: Negative.   Cardiovascular: Negative.   Gastrointestinal: Positive for abdominal pain and nausea.  Endocrine: Negative.   Genitourinary: Negative.   Musculoskeletal: Negative.   Skin: Negative.   Allergic/Immunologic: Negative.   Neurological: Negative.   Hematological: Negative.   Psychiatric/Behavioral: Negative.       Objective:   Physical Exam  Constitutional: She is oriented to person, place, and time. She appears well-developed and well-nourished. No distress.  HENT:  Head: Normocephalic and atraumatic.  Eyes: Pupils are equal, round, and reactive to light. Conjunctivae are normal.  Neck: Normal range of motion.  Cardiovascular: Normal rate, regular rhythm, normal heart  sounds and intact distal pulses.   Pulses:      Radial pulses are 2+ on the right side, and 2+ on the left side.  Pulmonary/Chest: Effort normal and breath sounds normal.  Abdominal: Soft. Bowel sounds are normal. She exhibits no distension. There is no tenderness. There is no rebound.  Musculoskeletal: Normal range of motion. She exhibits no edema.  Neurological: She is alert and oriented to person, place, and time.  Skin: Skin is warm and dry. She is not diaphoretic.  Psychiatric: She has a normal mood and affect. Her behavior is normal. Judgment and thought content normal.  Vitals reviewed.  BP (!) 147/70 (BP Location: Right Arm)   Pulse (!) 50   Resp 16   Ht 5\' 10"  (1.778 m)   Wt 150 lb (68 kg)   LMP  (LMP Unknown)   BMI 21.52 kg/m   Past Medical History:  Diagnosis Date  . A-fib (HCC)    a. on warfarin  . Anemia   . Carotid artery stenosis    a. ultrasound 03/2015: RICA 73-41% stenosis, LICA less than 93% stenosis  . Chronic diastolic CHF (congestive heart failure) ([place])    a. echo 03/2015: EF 60-65%, normal wall motion, diastolic parameters were c/w restrictive physiology, indicative of decreased LV diastolic compliance and/or increased LA pressure, mild AS, mild MR, left atrium was severely dilated, RA was severely dilated, PASP was severely increased at 90 mm Hg  . Coronary artery disease, non-occlusive    a. cardiac cath 2013  . ESRD (end stage renal disease) ([place])    On Tue-Thur-Sat dialysis  . H/O ischemic right MCA stroke    a. 03/2015; b. residual left-sided facial droop and slurred speech  . Hyperparathyroidism, secondary renal ([place])   .  Hypertension   . Nicotine dependence   . Pulmonary HTN ([place])    as above  . Symptomatic bradycardia    a. history of symptomatic bradycardia; b. felt to be 2/2 metabolic abnormalities & required temp wire but no PPM, resolved with HD   Social History   Social History  . Marital status: Divorced    Spouse name: N/A  .  Number of children: N/A  . Years of education: N/A   Occupational History  . retired    Social History Main Topics  . Smoking status: Former Smoker    Packs/day: 0.25    Years: 30.00    Types: Cigarettes    Quit date: 11/20/2013  . Smokeless tobacco: Never Used  . Alcohol use No  . Drug use: No  . Sexual activity: Not on file   Other Topics Concern  . Not on file   Social History Narrative   Lives at home independently. Has a cane for ambulation.   Past Surgical History:  Procedure Laterality Date  . A/V FISTULAGRAM Left 10/25/2016   Procedure: A/V Fistulagram;  Surgeon: Katha Cabal, MD;  Location: [place] [facility];  Service: Cardiovascular;  Laterality: Left;  . COLONOSCOPY WITH PROPOFOL N/A 12/19/2016   Procedure: COLONOSCOPY WITH PROPOFOL;  Surgeon: Jonathon Bellows, MD;  Location: [facility] ENDOSCOPY;  Service: Gastroenterology;  Laterality: N/A;  . ESOPHAGOGASTRODUODENOSCOPY (EGD) WITH PROPOFOL N/A 04/14/2015   Procedure: ESOPHAGOGASTRODUODENOSCOPY (EGD) WITH PROPOFOL;  Surgeon: Lucilla Lame, MD;  Location: [facility] ENDOSCOPY;  Service: Endoscopy;  Laterality: N/A;  . ESOPHAGOGASTRODUODENOSCOPY (EGD) WITH PROPOFOL N/A 07/17/2015   Procedure: ESOPHAGOGASTRODUODENOSCOPY (EGD) WITH PROPOFOL;  Surgeon: Josefine Class, MD;  Location: [facility] ENDOSCOPY;  Service: Endoscopy;  Laterality: N/A;  . ESOPHAGOGASTRODUODENOSCOPY (EGD) WITH PROPOFOL N/A 12/19/2016   Procedure: ESOPHAGOGASTRODUODENOSCOPY (EGD) WITH PROPOFOL;  Surgeon: Jonathon Bellows, MD;  Location: [facility]OSCOPY;  Service: Gastroenterology;  Laterality: N/A;  . EUS N/A 09/24/2015   Procedure: UPPER ENDOSCOPIC ULTRASOUND (EUS) LINEAR;  Surgeon: Jola Schmidt, MD;  Location: [facility] ENDOSCOPY;  Service: Endoscopy;  Laterality: N/A;  . LOWER EXTREMITY ANGIOGRAPHY Right 06/09/2016   Procedure: Lower Extremity Angiography;  Surgeon: Algernon Huxley, MD;  Location: [place] [facility];  Service: Cardiovascular;  Laterality: Right;  . PERIPHERAL  VASCULAR CATHETERIZATION N/A 02/23/2015   Procedure: A/V Shuntogram/Fistulagram;  Surgeon: Algernon Huxley, MD;  Location: [place] [facility];  Service: Cardiovascular;  Laterality: N/A;  . PERIPHERAL VASCULAR CATHETERIZATION N/A 02/23/2015   Procedure: A/V Shunt Intervention;  Surgeon: Algernon Huxley, MD;  Location: [place] [facility];  Service: Cardiovascular;  Laterality: N/A;   Family History  Problem Relation Age of Onset  . CAD Father   . Dementia Mother   . Lupus Sister    No Known Allergies     Assessment & Plan:  Patient presents at the request of Dr. Holley Raring for evaluation of mesenteric artery stenosis. Patient underwent a CAT scan for evaluation of generalized abdominal pain, nausea and vomiting on 01/19/2017. The CT was notable for "extensive vascular calcification throughout the abdominal aorta and all major branch vessels. Vessels are patent but areas of significant stenosis are likely. Mesenteric artery calcification could contribute to intestinal angina no aneurysms". Patient continues to experience intermittent abdominal pain, intermittent postprandial pain, intermittent nausea. She denies any changes to her bowel habits. The patient underwent a mesenteric artery duplex exam which was notable for decreased visualization of the abdominal vasculature due to overlying bowel gas patterns and atherosclerotic calcifications. Doppler velocities  suggesting greater than 70% stenosis of the celiac and superior mesenteric arteries based on limited visualization.   1. 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 Stenosis to the mesenteric arteries seen on CT and our duplex today. Recommend a mesenteric angiogram to assess anatomy and at that point correct any areas of stenosis  Procedure, risks and benefits explained to the patient All questions answered Patient wishes to proceed  2. End stage renal disease on dialysis  ([place]) - Stable Patient denies any issues with her dialysis access at this time  3. Hyperlipidemia, unspecified hyperlipidemia type - Stable Encouraged good control as its slows the progression of atherosclerotic disease  4. Essential hypertension - Stable Encouraged good control as its slows the progression of atherosclerotic disease  Current Outpatient Prescriptions on File Prior to Visit  Medication Sig Dispense Refill  . acetaminophen (TYLENOL) 500 MG tablet Take 500 mg by mouth every 4 (four) hours as needed for fever.    Marland Kitchen alum & mag hydroxide-simeth (MAALOX PLUS) 400-400-40 MG/5ML suspension Take 30 mLs by mouth as needed for indigestion.    Marland Kitchen amLODipine (NORVASC) 10 MG tablet Take 1 tablet (10 mg total) by mouth daily. (Patient taking differently: Take 10 mg by mouth at bedtime. ) 30 tablet 0  . atorvastatin (LIPITOR) 40 MG tablet Take 1 tablet (40 mg total) by mouth daily. (Patient taking differently: Take 40 mg by mouth at bedtime. ) 30 tablet 0  . calcium acetate (PHOSLO) 667 MG capsule Take 1,334 mg by mouth 3 (three) times daily.    . carvedilol (COREG) 6.25 MG tablet Take 1 tablet (6.25 mg total) by mouth 2 (two) times daily with a meal. 60 tablet 0  . cetirizine (ZYRTEC) 10 MG tablet Take 10 mg by mouth daily.    Marland Kitchen escitalopram (LEXAPRO) 10 MG tablet Take 10 mg by mouth daily.    Marland Kitchen gabapentin (NEURONTIN) 300 MG capsule Take 300 mg by mouth at bedtime.    Marland Kitchen guaifenesin (ROBITUSSIN) 100 MG/5ML syrup Take 200 mg by mouth every 6 (six) hours as needed for cough.    . loperamide (IMODIUM) 2 MG capsule Take 2 mg by mouth as needed for diarrhea or loose stools.    Marland Kitchen losartan (COZAAR) 100 MG tablet Take 1 tablet (100 mg total) by mouth daily. (Patient taking differently: Take 100 mg by mouth at bedtime. ) 30 tablet 0  . magnesium hydroxide (MILK OF MAGNESIA) 400 MG/5ML suspension Take 30 mLs by mouth at bedtime as needed for mild constipation.    Marland Kitchen neomycin-bacitracin-polymyxin  (NEOSPORIN) ointment Apply 1 application topically as needed for wound care. apply to eye    . ondansetron (ZOFRAN) 4 MG tablet Take 4 mg by mouth every 8 (eight) hours as needed for nausea or vomiting.    . pantoprazole (PROTONIX) 40 MG tablet Take 1 tablet (40 mg total) by mouth 2 (two) times daily. 60 tablet 0  . polyethylene glycol (MIRALAX / GLYCOLAX) packet Take 17 g by mouth daily as needed for mild constipation. 14 each 0  . spironolactone (ALDACTONE) 25 MG tablet Take 1 tablet (25 mg total) by mouth daily. 30 tablet 1  . traZODone (DESYREL) 50 MG tablet Take 25 mg by mouth at bedtime.     . trolamine salicylate (ASPERCREME) 10 % cream Apply topically 2 (two) times daily as needed for muscle pain. 85 g 0   No current facility-administered medications on file prior  to visit.     There are no Patient Instructions on file for this visit. No Follow-up on file.   Freda Jaquith A Salene Mohamud, PA-C

## 2017-04-03 MED ORDER — CEFAZOLIN SODIUM-DEXTROSE 1-4 GM/50ML-% IV SOLN
1.0000 g | Freq: Once | INTRAVENOUS | Status: DC
  Administered 2017-04-04: 1 g via INTRAVENOUS

## 2017-04-04 ENCOUNTER — Encounter
Admission: RE | Disposition: A | Discharge status: Skilled Nursing Facility | Payer: Self-pay | Source: Ambulatory Visit | Attending: Vascular Surgery

## 2017-04-04 ENCOUNTER — Encounter (INDEPENDENT_AMBULATORY_CARE_PROVIDER_SITE_OTHER): Payer: Medicare Other

## 2017-04-04 ENCOUNTER — Ambulatory Visit (INDEPENDENT_AMBULATORY_CARE_PROVIDER_SITE_OTHER): Payer: Medicare Other | Admitting: Vascular Surgery

## 2017-04-04 ENCOUNTER — Ambulatory Visit
Admission: RE | Admit: 2017-04-04 | Discharge: 2017-04-04 | Disposition: A | Discharge status: Skilled Nursing Facility | Payer: Medicare Other | Source: Ambulatory Visit | Attending: Vascular Surgery | Admitting: Vascular Surgery

## 2017-04-04 DIAGNOSIS — I774 Celiac artery compression syndrome: Secondary | ICD-10-CM | POA: Insufficient documentation

## 2017-04-04 DIAGNOSIS — K551 Chronic vascular disorders of intestine: Secondary | ICD-10-CM | POA: Diagnosis present

## 2017-04-04 HISTORY — PX: VISCERAL ANGIOGRAPHY: CATH118276

## 2017-04-04 LAB — POTASSIUM (ARMC VASCULAR LAB ONLY): POTASSIUM (ARMC VASCULAR LAB): 4.2 (ref 3.5–5.1)

## 2017-04-04 SURGERY — VISCERAL ANGIOGRAPHY
Anesthesia: Moderate Sedation

## 2017-04-04 MED ORDER — FAMOTIDINE 20 MG PO TABS: 40.0000 mg | ORAL_TABLET | ORAL | Status: DC | PRN

## 2017-04-04 MED ORDER — FENTANYL CITRATE (PF) 100 MCG/2ML IJ SOLN
INTRAMUSCULAR | Status: AC
  Filled 2017-04-04: qty 2

## 2017-04-04 MED ORDER — DEXTROSE 5 % IV SOLN
Freq: Once | INTRAVENOUS | Status: DC
  Filled 2017-04-04: qty 10

## 2017-04-04 MED ORDER — MORPHINE SULFATE (PF) 4 MG/ML IV SOLN
2.0000 mg | INTRAVENOUS | Status: DC | PRN
Start: 2017-04-04 — End: 2017-04-04

## 2017-04-04 MED ORDER — HEPARIN SODIUM (PORCINE) 1000 UNIT/ML IJ SOLN
INTRAMUSCULAR | Status: DC | PRN
  Administered 2017-04-04: 4000 [IU] via INTRAVENOUS

## 2017-04-04 MED ORDER — METHYLPREDNISOLONE SODIUM SUCC 125 MG IJ SOLR: 125.0000 mg | INTRAMUSCULAR | Status: DC | PRN

## 2017-04-04 MED ORDER — MIDAZOLAM HCL 2 MG/2ML IJ SOLN
INTRAMUSCULAR | Status: DC | PRN
  Administered 2017-04-04: 1 mg via INTRAVENOUS
  Administered 2017-04-04: 2 mg via INTRAVENOUS

## 2017-04-04 MED ORDER — LIDOCAINE HCL (PF) 1 % IJ SOLN
INTRAMUSCULAR | Status: AC
  Filled 2017-04-04: qty 30

## 2017-04-04 MED ORDER — HEPARIN (PORCINE) IN NACL 2-0.9 UNIT/ML-% IJ SOLN
INTRAMUSCULAR | Status: AC
  Filled 2017-04-04: qty 1000

## 2017-04-04 MED ORDER — HEPARIN SODIUM (PORCINE) 1000 UNIT/ML IJ SOLN
INTRAMUSCULAR | Status: AC
  Filled 2017-04-04: qty 1

## 2017-04-04 MED ORDER — MIDAZOLAM HCL 5 MG/5ML IJ SOLN
INTRAMUSCULAR | Status: AC
  Filled 2017-04-04: qty 5

## 2017-04-04 MED ORDER — IOPAMIDOL (ISOVUE-300) INJECTION 61%
INTRAVENOUS | Status: DC | PRN
  Administered 2017-04-04: 50 mL via INTRA_ARTERIAL

## 2017-04-04 MED ORDER — FENTANYL CITRATE (PF) 100 MCG/2ML IJ SOLN
INTRAMUSCULAR | Status: DC | PRN
  Administered 2017-04-04 (×2): 50 ug via INTRAVENOUS

## 2017-04-04 MED ORDER — SODIUM CHLORIDE 0.9 % IV SOLN
INTRAVENOUS | Status: DC
  Administered 2017-04-04: 1000 mL via INTRAVENOUS

## 2017-04-04 SURGICAL SUPPLY — 25 items
BALLN ARMADA 4X40X135 (BALLOONS) ×3
BALLN ULTRV 018 4X40X75 (BALLOONS) ×3
BALLN ULTRVRSE 4X20X130C (BALLOONS) ×3
BALLOON ARMADA 4X40X135 (BALLOONS) ×1 IMPLANT
BALLOON ULTRV 018 4X40X75 (BALLOONS) ×1 IMPLANT
BALLOON ULTRVRSE 4X20X130C (BALLOONS) ×1 IMPLANT
CATH GLIDE EXCHANG 5FR (CATHETERS) ×3 IMPLANT
CATH IMAGER II S 5FR 65CM (MISCELLANEOUS) ×3 IMPLANT
CATH VS15FR (CATHETERS) ×3 IMPLANT
DEVICE PRESTO INFLATION (MISCELLANEOUS) ×3 IMPLANT
DEVICE STARCLOSE SE CLOSURE (Vascular Products) ×3 IMPLANT
DEVICE TORQUE (MISCELLANEOUS) ×3 IMPLANT
GLIDEWIRE STIFF .35X180X3 HYDR (WIRE) ×3 IMPLANT
NEEDLE ENTRY 21GA 7CM ECHOTIP (NEEDLE) ×3 IMPLANT
PACK ANGIOGRAPHY (CUSTOM PROCEDURE TRAY) ×3 IMPLANT
SET INTRO CAPELLA COAXIAL (SET/KITS/TRAYS/PACK) ×3 IMPLANT
SHEATH BRITE TIP 5FRX11 (SHEATH) ×3 IMPLANT
SHEATH DEST 7F 45CM LIMA (SHEATH) ×3 IMPLANT
STENT LIFESTREAM 7X16X80 (Permanent Stent) ×3 IMPLANT
SYR MEDRAD MARK V 150ML (SYRINGE) ×3 IMPLANT
TUBING CONTRAST HIGH PRESS 72 (TUBING) ×3 IMPLANT
WIRE G.018X260 SHT (WIRE) ×3 IMPLANT
WIRE J 3MM .035X145CM (WIRE) ×3 IMPLANT
WIRE MAGIC TOR.035 180C (WIRE) ×3 IMPLANT
WIRE MAGIC TORQUE 260C (WIRE) ×3 IMPLANT

## 2017-04-04 NOTE — Op Note (Signed)
[facility] History & Physical Update  The patient was interviewed and re-examined.  The patient's previous History and Physical has been reviewed and is unchanged.  There is no change in the plan of care. We plan to proceed with the scheduled procedure.  Hortencia Pilar, MD  04/04/2017, 10:48 AM

## 2017-04-04 NOTE — Op Note (Signed)
[facility] Percutaneous Study/Intervention Procedural Note   Date: 04/04/2017  Surgeon(s): Hortencia Pilar, MD  Assistants: none  Pre-operative Diagnosis: 1.  Chronic mesenteric ischemia 2.  Celiac and SMA stenosis   Post-operative diagnosis: Same  Procedure(s) Performed: 1. Ultrasound guidance for vascular access right femoral artery femoral artery 2. Catheter placement into hepatic artery from right femoral approach 3. Aortogram and selective angiogram of the celiac artery  4.  Percutaneous transluminal angioplasty of the celiac with a 4 mm diameter x 40 mm length Ultraverse angioplasty balloon 5. Stent to the celiac with 7 mm diameter x 16 mm length Lifestream balloon expandable stent 6. StarClose closure device right femoral artery  Contrast: 50 mils  Fluoro time: 13.7 minutes  EBL: 25  Indications: Patient is a 71 y.o. female who has symptoms consistent with mesenteric ischemia. The patient has a duplex ultrasound and a CT scan showing celiac stenosis as well as stenosis of the SMA.  Both of the studies suggest the celiac stenosis is more pronounced than the SMA stenosis.  The patient is brought in for angiography for further evaluation and potential treatment. Risks and benefits are discussed and informed consent is obtained  Procedure: The patient was identified and appropriate procedural time out was performed. The patient was then placed supine on the table and prepped and draped in the usual sterile fashion. Ultrasound was used to evaluate the right common femoral artery. It was patent. A digital ultrasound image was acquired. A micropuncture needle was used to access the right common femoral artery under direct ultrasound guidance and a permanent image was performed. Microwire followed by a micro-sheath was then inserted.  A 0.035 J wire was advanced without  resistance and a 5Fr sheath was placed. Pigtail catheter was placed into the aorta and an AP aortogram was performed. This demonstrated the SMA and celiac arteries right the T12 level.   We transitioned to the lateral projection to image the celiac and SMA. The lateral image demonstrated the celiac artery was a string sign at its origin while the SMA appeared to have approximately 50% stenosis at its origin. The patient was given 4000 units of IV heparin.We upsized to a 7 Fr sheath with a LIMA shape.  A VS 1 catheter was used to selectively cannulate the celiac. This demonstrated a 99% stenosis of the origin of the celiac. Based on her symptoms and these findings, I elected to treat the celiac to try to improve the patient's clinical course. I crossed the lesion with moderate difficulty with Glidewire and then a straight glide catheter.  I then had to exchange for a Platinum Plus 0.018 wire which allowed for passage of the balloon.  The Ultraverse and [place] balloons for the 035 diameter would not track across the stenosis.  I used a 4 mm diameter x 40 mm length angioplasty balloon and inflated the balloon to 14 atm for one minute.  Completion angiogram demonstrated moderate improvement, so I elected to proceed with stent placement.  I was then able to advance the sheath across the lesion.  I passed the straight glide catheter over the wire and exchanged for the 035 Magic torque wire.  Then used a 7 mm diameter x 16 mm length Lifestream balloon expandable stent to perform treatment of the intervention of the celiac. I inflated the balloon to 14 Atm. On completion angiogram following this, less than 5 % residual stenosis was identified. At this point, I elected to terminate the procedure. The diagnostic  catheter was removed. StarClose closure device was deployed in usual fashion with excellent hemostatic result. The patient was taken to the recovery room in stable condition having tolerated the procedure  well.     Findings:Initial imaging of the aorta demonstrates diffuse calcifications.  The visceral arteries are readily apparent under fluoroscopy secondary to calcifications.  There is filling of both the celiac and the SMA in the AP projection.  In the lateral projection the origin of the celiac demonstrates a string sign while the SMA appears to have a 50% diameter reduction.  This was also verified in a steep oblique projection as well as in the true lateral.  Given these findings I elected to treat the celiac initially as it appears to be the more significant lesion.  Angioplasty was performed with a 4 mm balloon and then a 7 mm lifestream stent was placed.  Follow-up imaging demonstrated wide patency of the celiac with preservation of the distal perfusion  The patient will now be reassessed if her abdominal symptoms have resolved I would continue to follow the SMA stenosis as it did not appear to be greater than 65%.  However if her symptoms persist further investigation of the SMA may be warranted perhaps intravascular ultrasound could be considered.  Disposition: Patient was taken to the recovery room in stable condition having tolerated the procedure well.  Complications: None  Hortencia Pilar 04/04/2017 12:16 PM  This note was created with Dragon Medical transcription system. Any errors in dictation are purely unintentional.

## 2017-04-16 NOTE — Progress Notes (Signed)
MRN : [id number]  Toni Carlson is a 71 y.o. ([birth year]) female who presents with chief complaint of No chief complaint on file. Marland Kitchen  History of Present Illness: The patient returns to the office for followup and review of the noninvasive studies. She is s/p PTA and stenting of the celiac artery on 04/04/2017.  There have been no significant changes to the patient's overall health care.  The patient denies amaurosis fugax or recent TIA symptoms. There are no recent neurological changes noted. The patient denies history of DVT, PE or superficial thrombophlebitis. The patient denies recent episodes of angina or shortness of breath.   Duplex ultrasound of the mesenteric shows a widely patent celiac stent and widely patent SMA  No outpatient medications have been marked as taking for the 04/17/17 encounter (Appointment) with Delana Meyer, Dolores Lory, MD.    Past Medical History:  Diagnosis Date  . A-fib (HCC)    a. on warfarin  . Anemia   . Carotid artery stenosis    a. ultrasound 03/2015: RICA 35-32% stenosis, LICA less than 99% stenosis  . Chronic diastolic CHF (congestive heart failure) ([place])    a. echo 03/2015: EF 60-65%, normal wall motion, diastolic parameters were c/w restrictive physiology, indicative of decreased LV diastolic compliance and/or increased LA pressure, mild AS, mild MR, left atrium was severely dilated, RA was severely dilated, PASP was severely increased at 90 mm Hg  . Coronary artery disease, non-occlusive    a. cardiac cath 2013  . ESRD (end stage renal disease) ([place])    On Tue-Thur-Sat dialysis  . H/O ischemic right MCA stroke    a. 03/2015; b. residual left-sided facial droop and slurred speech  . Hyperparathyroidism, secondary renal ([place])   . Hypertension   . Nicotine dependence   . Pulmonary HTN ([place])    as above  . Symptomatic bradycardia    a. history of symptomatic bradycardia; b. felt to be 2/2 metabolic abnormalities & required temp wire but no PPM,  resolved with HD    Past Surgical History:  Procedure Laterality Date  . A/V FISTULAGRAM Left 10/25/2016   Procedure: A/V Fistulagram;  Surgeon: Katha Cabal, MD;  Location: [place] [facility];  Service: Cardiovascular;  Laterality: Left;  . COLONOSCOPY WITH PROPOFOL N/A 12/19/2016   Procedure: COLONOSCOPY WITH PROPOFOL;  Surgeon: Jonathon Bellows, MD;  Location: [facility] ENDOSCOPY;  Service: Gastroenterology;  Laterality: N/A;  . ESOPHAGOGASTRODUODENOSCOPY (EGD) WITH PROPOFOL N/A 04/14/2015   Procedure: ESOPHAGOGASTRODUODENOSCOPY (EGD) WITH PROPOFOL;  Surgeon: Lucilla Lame, MD;  Location: [facility] ENDOSCOPY;  Service: Endoscopy;  Laterality: N/A;  . ESOPHAGOGASTRODUODENOSCOPY (EGD) WITH PROPOFOL N/A 07/17/2015   Procedure: ESOPHAGOGASTRODUODENOSCOPY (EGD) WITH PROPOFOL;  Surgeon: Josefine Class, MD;  Location: [facility] ENDOSCOPY;  Service: Endoscopy;  Laterality: N/A;  . ESOPHAGOGASTRODUODENOSCOPY (EGD) WITH PROPOFOL N/A 12/19/2016   Procedure: ESOPHAGOGASTRODUODENOSCOPY (EGD) WITH PROPOFOL;  Surgeon: Jonathon Bellows, MD;  Location: [facility] ENDOSCOPY;  Service: Gastroenterology;  Laterality: N/A;  . EUS N/A 09/24/2015   Procedure: UPPER ENDOSCOPIC ULTRASOUND (EUS) LINEAR;  Surgeon: Jola Schmidt, MD;  Location: [facility] ENDOSCOPY;  Service: Endoscopy;  Laterality: N/A;  . LOWER EXTREMITY ANGIOGRAPHY Right 06/09/2016   Procedure: Lower Extremity Angiography;  Surgeon: Algernon Huxley, MD;  Location: [place] [facility];  Service: Cardiovascular;  Laterality: Right;  . PERIPHERAL VASCULAR CATHETERIZATION N/A 02/23/2015   Procedure: A/V Shuntogram/Fistulagram;  Surgeon: Algernon Huxley, MD;  Location: [place] [facility];  Service: Cardiovascular;  Laterality: N/A;  . PERIPHERAL VASCULAR CATHETERIZATION N/A  02/23/2015   Procedure: A/V Shunt Intervention;  Surgeon: Algernon Huxley, MD;  Location: [place] [facility];  Service: Cardiovascular;  Laterality: N/A;  . VISCERAL ANGIOGRAPHY N/A 04/04/2017   Procedure: VISCERAL  ANGIOGRAPHY;  Surgeon: Katha Cabal, MD;  Location: [place] [facility];  Service: Cardiovascular;  Laterality: N/A;    Social History Social History   Tobacco Use  . Smoking status: Former Smoker    Packs/day: 0.25    Years: 30.00    Pack years: 7.50    Types: Cigarettes    Last attempt to quit: 11/20/2013    Years since quitting: 3.4  . Smokeless tobacco: Never Used  Substance Use Topics  . Alcohol use: No    Alcohol/week: 0.0 oz  . Drug use: No    Family History Family History  Problem Relation Age of Onset  . CAD Father   . Dementia Mother   . Lupus Sister     No Known Allergies   REVIEW OF SYSTEMS (Negative unless checked)  Constitutional: [] Weight loss  [] Fever  [] Chills Cardiac: [] Chest pain   [] Chest pressure   [] Palpitations   [] Shortness of breath when laying flat   [] Shortness of breath with exertion. Vascular:  [] Pain in legs with walking   [] Pain in legs at rest  [] History of DVT   [] Phlebitis   [] Swelling in legs   [] Varicose veins   [] Non-healing ulcers Pulmonary:   [] Uses home oxygen   [] Productive cough   [] Hemoptysis   [] Wheeze  [] COPD   [] Asthma Neurologic:  [] Dizziness   [] Seizures   [] History of stroke   [] History of TIA  [] Aphasia   [] Vissual changes   [] Weakness or numbness in arm   [] Weakness or numbness in leg Musculoskeletal:   [] Joint swelling   [] Joint pain   [] Low back pain Hematologic:  [] Easy bruising  [] Easy bleeding   [] Hypercoagulable state   [] Anemic Gastrointestinal:  [] Diarrhea   [] Vomiting  [] Gastroesophageal reflux/heartburn   [] Difficulty swallowing. Genitourinary:  [x] Chronic kidney disease   [] Difficult urination  [] Frequent urination   [] Blood in urine Skin:  [] Rashes   [] Ulcers  Psychological:  [] History of anxiety   []  History of major depression.  Physical Examination  There were no vitals filed for this visit. There is no height or weight on file to calculate BMI. Gen: WD/WN, NAD Head: [place]/AT, No temporalis wasting.   Ear/Nose/Throat: Hearing grossly intact, nares w/o erythema or drainage Eyes: PER, EOMI, sclera nonicteric.  Neck: Supple, no large masses.   Pulmonary:  Good air movement, no audible wheezing bilaterally, no use of accessory muscles.  Cardiac: RRR, no JVD Vascular: left arm AV access good thrill good bruit Vessel Right Left  Radial Palpable Palpable  Ulnar Palpable Palpable  Brachial Palpable Palpable  Carotid Palpable Palpable  Gastrointestinal: Non-distended. No guarding/no peritoneal signs.  Musculoskeletal: M/S 5/5 throughout.  No deformity or atrophy.  Neurologic: CN 2-12 intact. Symmetrical.  Speech is fluent. Motor exam as listed above. Psychiatric: Judgment intact, Mood & affect appropriate for pt's clinical situation. Dermatologic: No rashes or ulcers noted.  No changes consistent with cellulitis. Lymph : No lichenification or skin changes of chronic lymphedema.  CBC Lab Results  Component Value Date   WBC 5.9 01/20/2017   HGB 8.7 (L) 01/21/2017   HCT 26.1 (L) 01/20/2017   MCV 89.5 01/20/2017   PLT 161 01/20/2017    BMET    Component Value Date/Time   NA 137 01/20/2017 0833   NA 137 11/26/2013  0650   K 4.2 01/20/2017 0833   K 5.2 (H) 11/26/2013 0650   CL 95 (L) 01/20/2017 0833   CL 102 11/26/2013 0650   CO2 29 01/20/2017 0833   CO2 26 11/26/2013 0650   GLUCOSE 82 01/20/2017 0833   GLUCOSE 87 11/26/2013 0650   BUN 40 (H) 01/20/2017 0833   BUN 52 (H) 11/26/2013 0650   CREATININE 6.15 (H) 01/20/2017 0833   CREATININE 9.20 (H) 11/26/2013 0650   CALCIUM 8.2 (L) 01/20/2017 0833   CALCIUM 8.8 11/26/2013 0650   GFRNONAA 6 (L) 01/20/2017 0833   GFRNONAA 4 (L) 11/26/2013 0650   GFRAA 7 (L) 01/20/2017 0833   GFRAA 5 (L) 11/26/2013 0650   CrCl cannot be calculated (Patient's most recent lab result is older than the maximum 21 days allowed.).  COAG Lab Results  Component Value Date   INR 1.25 12/17/2016   INR 1.11 11/16/2016   INR 0.98 05/12/2016     Radiology No results found.   Assessment/Plan 1. Mesenteric artery stenosis (HCC) Recommend:  The patient is status post successful mesenteric angiogram with intervention.  The patient reports that the abdominal and post prandial pain is essentially gone.   The patient denies lifestyle limiting changes at this point in time.  No further invasive studies, angiography or surgery at this time  The patient should continue walking and begin a more formal exercise program.   The patient should continue antiplatelet therapy and aggressive treatment of the lipid abnormalities  Smoking cessation was again discussed  The patient should continue wearing graduated compression socks 10-15 mmHg strength to control the mild edema.  Patient should undergo noninvasive studies as ordered. The patient will follow up with me after the studies.    2. Atherosclerotic peripheral vascular disease ([place])  Recommend:  The patient has evidence of atherosclerosis of the lower extremities with claudication.  The patient does not voice lifestyle limiting changes at this point in time.  Noninvasive studies do not suggest clinically significant change.  No invasive studies, angiography or surgery at this time The patient should continue walking and begin a more formal exercise program.  The patient should continue antiplatelet therapy and aggressive treatment of the lipid abnormalities  No changes in the patient's medications at this time  The patient should continue wearing graduated compression socks 10-15 mmHg strength to control the mild edema.    3. Paroxysmal atrial fibrillation (HCC) Continue antiarrhythmia medications as already ordered, these medications have been reviewed and there are no changes at this time.  Continue anticoagulation as ordered by Cardiology Service   4. Essential hypertension Continue antihypertensive medications as already ordered, these medications have been  reviewed and there are no changes at this time.   5. End stage renal disease on dialysis [facility]) Continue dialysis as ordered    Hortencia Pilar, MD  04/16/2017 6:50 PM

## 2017-04-17 ENCOUNTER — Ambulatory Visit (INDEPENDENT_AMBULATORY_CARE_PROVIDER_SITE_OTHER): Payer: Medicare Other | Admitting: Vascular Surgery

## 2017-04-17 ENCOUNTER — Encounter (INDEPENDENT_AMBULATORY_CARE_PROVIDER_SITE_OTHER): Payer: Self-pay | Admitting: Vascular Surgery

## 2017-04-17 ENCOUNTER — Ambulatory Visit (INDEPENDENT_AMBULATORY_CARE_PROVIDER_SITE_OTHER): Payer: Medicare Other

## 2017-04-17 ENCOUNTER — Encounter (INDEPENDENT_AMBULATORY_CARE_PROVIDER_SITE_OTHER): Payer: Medicare Other

## 2017-04-17 ENCOUNTER — Other Ambulatory Visit (INDEPENDENT_AMBULATORY_CARE_PROVIDER_SITE_OTHER): Payer: Self-pay | Admitting: Vascular Surgery

## 2017-04-17 VITALS — BP 170/69 | HR 53 | Resp 16 | Wt 153.0 lb

## 2017-04-17 DIAGNOSIS — I480 Paroxysmal atrial fibrillation: Secondary | ICD-10-CM | POA: Diagnosis not present

## 2017-04-17 DIAGNOSIS — T829XXS Unspecified complication of cardiac and vascular prosthetic device, implant and graft, sequela: Secondary | ICD-10-CM

## 2017-04-17 DIAGNOSIS — K551 Chronic vascular disorders of intestine: Secondary | ICD-10-CM

## 2017-04-17 DIAGNOSIS — I771 Stricture of artery: Secondary | ICD-10-CM

## 2017-04-17 DIAGNOSIS — Z992 Dependence on renal dialysis: Secondary | ICD-10-CM | POA: Diagnosis not present

## 2017-04-17 DIAGNOSIS — I774 Celiac artery compression syndrome: Secondary | ICD-10-CM

## 2017-04-17 DIAGNOSIS — I70209 Unspecified atherosclerosis of native arteries of extremities, unspecified extremity: Secondary | ICD-10-CM | POA: Diagnosis not present

## 2017-04-17 DIAGNOSIS — Z95820 Peripheral vascular angioplasty status with implants and grafts: Secondary | ICD-10-CM

## 2017-04-17 DIAGNOSIS — I10 Essential (primary) hypertension: Secondary | ICD-10-CM

## 2017-04-17 DIAGNOSIS — T829XXA Unspecified complication of cardiac and vascular prosthetic device, implant and graft, initial encounter: Secondary | ICD-10-CM | POA: Insufficient documentation

## 2017-04-17 DIAGNOSIS — N186 End stage renal disease: Secondary | ICD-10-CM

## 2017-06-01 ENCOUNTER — Encounter (INDEPENDENT_AMBULATORY_CARE_PROVIDER_SITE_OTHER): Payer: Medicare Other

## 2017-06-01 ENCOUNTER — Ambulatory Visit (INDEPENDENT_AMBULATORY_CARE_PROVIDER_SITE_OTHER): Payer: Medicare Other | Admitting: Vascular Surgery

## 2017-06-22 ENCOUNTER — Encounter (INDEPENDENT_AMBULATORY_CARE_PROVIDER_SITE_OTHER): Payer: Medicare Other

## 2017-06-22 ENCOUNTER — Ambulatory Visit (INDEPENDENT_AMBULATORY_CARE_PROVIDER_SITE_OTHER): Payer: Medicare Other | Admitting: Vascular Surgery

## 2017-06-29 ENCOUNTER — Encounter (INDEPENDENT_AMBULATORY_CARE_PROVIDER_SITE_OTHER): Payer: Medicare Other

## 2017-06-29 ENCOUNTER — Ambulatory Visit (INDEPENDENT_AMBULATORY_CARE_PROVIDER_SITE_OTHER): Payer: Medicare Other | Admitting: Vascular Surgery

## 2017-07-06 ENCOUNTER — Ambulatory Visit (INDEPENDENT_AMBULATORY_CARE_PROVIDER_SITE_OTHER): Payer: Medicare Other

## 2017-07-06 ENCOUNTER — Encounter (INDEPENDENT_AMBULATORY_CARE_PROVIDER_SITE_OTHER): Payer: Self-pay | Admitting: Vascular Surgery

## 2017-07-06 ENCOUNTER — Ambulatory Visit (INDEPENDENT_AMBULATORY_CARE_PROVIDER_SITE_OTHER): Payer: Medicare Other | Admitting: Vascular Surgery

## 2017-07-06 VITALS — BP 134/64 | HR 51 | Resp 15 | Ht 63.0 in | Wt 152.0 lb

## 2017-07-06 DIAGNOSIS — Z992 Dependence on renal dialysis: Secondary | ICD-10-CM | POA: Diagnosis not present

## 2017-07-06 DIAGNOSIS — N186 End stage renal disease: Secondary | ICD-10-CM

## 2017-07-06 DIAGNOSIS — I480 Paroxysmal atrial fibrillation: Secondary | ICD-10-CM | POA: Diagnosis not present

## 2017-07-06 DIAGNOSIS — I10 Essential (primary) hypertension: Secondary | ICD-10-CM | POA: Diagnosis not present

## 2017-07-06 DIAGNOSIS — T829XXS Unspecified complication of cardiac and vascular prosthetic device, implant and graft, sequela: Secondary | ICD-10-CM

## 2017-07-06 DIAGNOSIS — I70213 Atherosclerosis of native arteries of extremities with intermittent claudication, bilateral legs: Secondary | ICD-10-CM

## 2017-07-07 ENCOUNTER — Encounter (INDEPENDENT_AMBULATORY_CARE_PROVIDER_SITE_OTHER): Payer: Self-pay | Admitting: Vascular Surgery

## 2017-07-07 NOTE — Progress Notes (Signed)
MRN : [id number]  Toni Carlson is a 72 y.o. (1946/03/25) female who presents with chief complaint of  Chief Complaint  Patient presents with  . Follow-up    6 month HDA  .  History of Present Illness: The patient returns to the office for followup of their dialysis access. The function of the access has been stable. The patient denies increased bleeding time or increased recirculation. Patient denies difficulty with cannulation. The patient denies hand pain or other symptoms consistent with steal phenomena.  No significant arm swelling.  The patient denies redness or swelling at the access site. The patient denies fever or chills at home or while on dialysis.  The patient denies amaurosis fugax or recent TIA symptoms. There are no recent neurological changes noted. The patient denies claudication symptoms or rest pain symptoms. The patient denies history of DVT, PE or superficial thrombophlebitis. The patient denies recent episodes of angina or shortness of breath.   Duplex ultrasound of the AV access shows a patent access.  The previously noted stenosis is unchanged compared to last study.      Current Meds  Medication Sig  . acetaminophen (TYLENOL) 500 MG tablet Take 500 mg by mouth every 4 (four) hours as needed for fever.  Marland Kitchen amLODipine (NORVASC) 10 MG tablet Take 1 tablet (10 mg total) by mouth daily. (Patient taking differently: Take 10 mg by mouth at bedtime. )  . atorvastatin (LIPITOR) 40 MG tablet Take 1 tablet (40 mg total) by mouth daily. (Patient taking differently: Take 40 mg by mouth at bedtime. )  . carvedilol (COREG) 6.25 MG tablet Take 1 tablet (6.25 mg total) by mouth 2 (two) times daily with a meal.  . cetirizine (ZYRTEC) 10 MG tablet Take 10 mg by mouth daily.  . clopidogrel (PLAVIX) 75 MG tablet Take 75 mg daily by mouth.  . escitalopram (LEXAPRO) 10 MG tablet Take 10 mg by mouth daily.  Marland Kitchen gabapentin (NEURONTIN) 300 MG capsule Take 300 mg by mouth at bedtime.   Marland Kitchen loperamide (IMODIUM) 2 MG capsule Take 2 mg by mouth as needed for diarrhea or loose stools.  Marland Kitchen losartan (COZAAR) 100 MG tablet Take 1 tablet (100 mg total) by mouth daily. (Patient taking differently: Take 100 mg by mouth at bedtime. )  . magnesium hydroxide (MILK OF MAGNESIA) 400 MG/5ML suspension Take 30 mLs by mouth at bedtime as needed for mild constipation.  . ondansetron (ZOFRAN) 4 MG tablet Take 4 mg by mouth every 8 (eight) hours as needed for nausea or vomiting.  . pantoprazole (PROTONIX) 40 MG tablet Take 1 tablet (40 mg total) by mouth 2 (two) times daily.  . polyethylene glycol (MIRALAX / GLYCOLAX) packet Take 17 g by mouth daily as needed for mild constipation.  Marland Kitchen spironolactone (ALDACTONE) 25 MG tablet Take 1 tablet (25 mg total) by mouth daily.  . traZODone (DESYREL) 50 MG tablet Take 25 mg by mouth at bedtime.   . trolamine salicylate (ASPERCREME) 10 % cream Apply topically 2 (two) times daily as needed for muscle pain.    Past Medical History:  Diagnosis Date  . A-fib (HCC)    a. on warfarin  . Anemia   . Carotid artery stenosis    a. ultrasound 03/2015: RICA 38-18% stenosis, LICA less than 29% stenosis  . Chronic diastolic CHF (congestive heart failure) ([place])    a. echo 03/2015: EF 60-65%, normal wall motion, diastolic parameters were c/w restrictive physiology, indicative of decreased LV diastolic compliance  and/or increased LA pressure, mild AS, mild MR, left atrium was severely dilated, RA was severely dilated, PASP was severely increased at 90 mm Hg  . Coronary artery disease, non-occlusive    a. cardiac cath 2013  . ESRD (end stage renal disease) ([place])    On Tue-Thur-Sat dialysis  . H/O ischemic right MCA stroke    a. 03/2015; b. residual left-sided facial droop and slurred speech  . Hyperparathyroidism, secondary renal ([place])   . Hypertension   . Nicotine dependence   . Pulmonary HTN ([place])    as above  . Symptomatic bradycardia    a. history of symptomatic  bradycardia; b. felt to be 2/2 metabolic abnormalities & required temp wire but no PPM, resolved with HD    Past Surgical History:  Procedure Laterality Date  . A/V FISTULAGRAM Left 10/25/2016   Procedure: A/V Fistulagram;  Surgeon: Katha Cabal, MD;  Location: [place] [facility];  Service: Cardiovascular;  Laterality: Left;  . COLONOSCOPY WITH PROPOFOL N/A 12/19/2016   Procedure: COLONOSCOPY WITH PROPOFOL;  Surgeon: Jonathon Bellows, MD;  Location: [facility] ENDOSCOPY;  Service: Gastroenterology;  Laterality: N/A;  . ESOPHAGOGASTRODUODENOSCOPY (EGD) WITH PROPOFOL N/A 04/14/2015   Procedure: ESOPHAGOGASTRODUODENOSCOPY (EGD) WITH PROPOFOL;  Surgeon: Lucilla Lame, MD;  Location: [facility] ENDOSCOPY;  Service: Endoscopy;  Laterality: N/A;  . ESOPHAGOGASTRODUODENOSCOPY (EGD) WITH PROPOFOL N/A 07/17/2015   Procedure: ESOPHAGOGASTRODUODENOSCOPY (EGD) WITH PROPOFOL;  Surgeon: Josefine Class, MD;  Location: [facility] ENDOSCOPY;  Service: Endoscopy;  Laterality: N/A;  . ESOPHAGOGASTRODUODENOSCOPY (EGD) WITH PROPOFOL N/A 12/19/2016   Procedure: ESOPHAGOGASTRODUODENOSCOPY (EGD) WITH PROPOFOL;  Surgeon: Jonathon Bellows, MD;  Location: [facility] ENDOSCOPY;  Service: Gastroenterology;  Laterality: N/A;  . EUS N/A 09/24/2015   Procedure: UPPER ENDOSCOPIC ULTRASOUND (EUS) LINEAR;  Surgeon: Jola Schmidt, MD;  Location: [facility] ENDOSCOPY;  Service: Endoscopy;  Laterality: N/A;  . LOWER EXTREMITY ANGIOGRAPHY Right 06/09/2016   Procedure: Lower Extremity Angiography;  Surgeon: Algernon Huxley, MD;  Location: [place] [facility];  Service: Cardiovascular;  Laterality: Right;  . PERIPHERAL VASCULAR CATHETERIZATION N/A 02/23/2015   Procedure: A/V Shuntogram/Fistulagram;  Surgeon: Algernon Huxley, MD;  Location: [place] [facility];  Service: Cardiovascular;  Laterality: N/A;  . PERIPHERAL VASCULAR CATHETERIZATION N/A 02/23/2015   Procedure: A/V Shunt Intervention;  Surgeon: Algernon Huxley, MD;  Location: [place] [facility];  Service: Cardiovascular;   Laterality: N/A;  . VISCERAL ANGIOGRAPHY N/A 04/04/2017   Procedure: VISCERAL ANGIOGRAPHY;  Surgeon: Katha Cabal, MD;  Location: [place] [facility];  Service: Cardiovascular;  Laterality: N/A;    Social History Social History   Tobacco Use  . Smoking status: Former Smoker    Packs/day: 0.25    Years: 30.00    Pack years: 7.50    Types: Cigarettes    Last attempt to quit: 11/20/2013    Years since quitting: 3.6  . Smokeless tobacco: Never Used  Substance Use Topics  . Alcohol use: No    Alcohol/week: 0.0 oz  . Drug use: No    Family History Family History  Problem Relation Age of Onset  . CAD Father   . Dementia Mother   . Lupus Sister     No Known Allergies   REVIEW OF SYSTEMS (Negative unless checked)  Constitutional: [] Weight loss  [] Fever  [] Chills Cardiac: [] Chest pain   [] Chest pressure   [] Palpitations   [] Shortness of breath when laying flat   [] Shortness of breath with exertion. Vascular:  [] Pain in legs with walking   [] Pain  in legs at rest  [] History of DVT   [] Phlebitis   [] Swelling in legs   [] Varicose veins   [] Non-healing ulcers Pulmonary:   [] Uses home oxygen   [] Productive cough   [] Hemoptysis   [] Wheeze  [] COPD   [] Asthma Neurologic:  [] Dizziness   [] Seizures   [] History of stroke   [] History of TIA  [] Aphasia   [] Vissual changes   [] Weakness or numbness in arm   [] Weakness or numbness in leg Musculoskeletal:   [] Joint swelling   [] Joint pain   [] Low back pain Hematologic:  [] Easy bruising  [] Easy bleeding   [] Hypercoagulable state   [] Anemic Gastrointestinal:  [] Diarrhea   [] Vomiting  [] Gastroesophageal reflux/heartburn   [] Difficulty swallowing. Genitourinary:  [x] Chronic kidney disease   [] Difficult urination  [] Frequent urination   [] Blood in urine Skin:  [] Rashes   [] Ulcers  Psychological:  [] History of anxiety   []  History of major depression.  Physical Examination  Vitals:   07/06/17 1535  BP: 134/64  Pulse: (!) 51  Resp: 15    Weight: 152 lb (68.9 kg)  Height: 5\' 3"  (1.6 m)   Body mass index is 26.93 kg/m. Gen: WD/WN, NAD Head: [place]/AT, No temporalis wasting.  Ear/Nose/Throat: Hearing grossly intact, nares w/o erythema or drainage Eyes: PER, EOMI, sclera nonicteric.  Neck: Supple, no large masses.   Pulmonary:  Good air movement, no audible wheezing bilaterally, no use of accessory muscles.  Cardiac: RRR, no JVD Vascular: Left arm AV fistula good thrill good bruit skin appears healthy and intact minimal pulsatility Vessel Right Left  Radial Palpable Palpable  Ulnar Palpable Palpable  Brachial Palpable Palpable  Gastrointestinal: Non-distended. No guarding/no peritoneal signs.  Musculoskeletal: M/S 5/5 throughout.  No deformity or atrophy.  Neurologic: CN 2-12 intact. Symmetrical.  Speech is fluent. Motor exam as listed above. Psychiatric: Judgment intact, Mood & affect appropriate for pt's clinical situation. Dermatologic: No rashes or ulcers noted.  No changes consistent with cellulitis. Lymph : No lichenification or skin changes of chronic lymphedema.  CBC Lab Results  Component Value Date   WBC 5.9 01/20/2017   HGB 8.7 (L) 01/21/2017   HCT 26.1 (L) 01/20/2017   MCV 89.5 01/20/2017   PLT 161 01/20/2017    BMET    Component Value Date/Time   NA 137 01/20/2017 0833   NA 137 11/26/2013 0650   K 4.2 01/20/2017 0833   K 5.2 (H) 11/26/2013 0650   CL 95 (L) 01/20/2017 0833   CL 102 11/26/2013 0650   CO2 29 01/20/2017 0833   CO2 26 11/26/2013 0650   GLUCOSE 82 01/20/2017 0833   GLUCOSE 87 11/26/2013 0650   BUN 40 (H) 01/20/2017 0833   BUN 52 (H) 11/26/2013 0650   CREATININE 6.15 (H) 01/20/2017 0833   CREATININE 9.20 (H) 11/26/2013 0650   CALCIUM 8.2 (L) 01/20/2017 0833   CALCIUM 8.8 11/26/2013 0650   GFRNONAA 6 (L) 01/20/2017 0833   GFRNONAA 4 (L) 11/26/2013 0650   GFRAA 7 (L) 01/20/2017 0833   GFRAA 5 (L) 11/26/2013 0650   CrCl cannot be calculated (Patient's most recent lab result is  older than the maximum 21 days allowed.).  COAG Lab Results  Component Value Date   INR 1.25 12/17/2016   INR 1.11 11/16/2016   INR 0.98 05/12/2016    Radiology No results found.   Assessment/Plan 1. End stage renal disease on dialysis [facility]) Recommend:  The patient is doing well and currently has adequate dialysis access. The patient's dialysis center is  not reporting any access issues. Flow pattern is stable when compared to the prior ultrasound.  The patient should have a duplex ultrasound of the dialysis access in 6 months.  The patient will follow-up with me in the office after each ultrasound   - VAS [place] (AVF, AVG); Future  2. Complication from renal dialysis device, sequela See #1 - VAS US DUPLEX DIALYSIS ACCESS (AVF, AVG); Future  3. Atherosclerosis of native artery of both lower extremities with intermittent claudication (HCC)  Recommend:  The patient has evidence of atherosclerosis of the lower extremities with claudication.  The patient does not voice lifestyle limiting changes at this point in time.  Noninvasive studies do not suggest clinically significant change.  No invasive studies, angiography or surgery at this time The patient should continue walking and begin a more formal exercise program.  The patient should continue antiplatelet therapy and aggressive treatment of the lipid abnormalities  No changes in the patient's medications at this time  The patient should continue wearing graduated compression socks 10-15 mmHg strength to control the mild edema.    4. Paroxysmal atrial fibrillation (HCC) Continue antiarrhythmia medications as already ordered, these medications have been reviewed and there are no changes at this time.  Continue anticoagulation as ordered by Cardiology Service   5. Essential hypertension Continue antihypertensive medications as already ordered, these medications have been reviewed and there are no  changes at this time.     Hortencia Pilar, MD  07/07/2017 12:44 PM

## 2017-10-10 ENCOUNTER — Encounter: Payer: Self-pay | Admitting: Emergency Medicine

## 2017-10-10 ENCOUNTER — Other Ambulatory Visit: Payer: Self-pay

## 2017-10-10 ENCOUNTER — Emergency Department: Payer: Medicare Other

## 2017-10-10 ENCOUNTER — Emergency Department
Admission: EM | Admit: 2017-10-10 | Discharge: 2017-10-10 | Disposition: A | Discharge status: Home / Self Care | Payer: Medicare Other | Attending: Student in an Organized Health Care Education/Training Program | Admitting: Student in an Organized Health Care Education/Training Program

## 2017-10-10 DIAGNOSIS — S7012XA Contusion of left thigh, initial encounter: Secondary | ICD-10-CM | POA: Insufficient documentation

## 2017-10-10 DIAGNOSIS — M25562 Pain in left knee: Secondary | ICD-10-CM | POA: Insufficient documentation

## 2017-10-10 DIAGNOSIS — Z7902 Long term (current) use of antithrombotics/antiplatelets: Secondary | ICD-10-CM | POA: Diagnosis not present

## 2017-10-10 DIAGNOSIS — Z79899 Other long term (current) drug therapy: Secondary | ICD-10-CM | POA: Diagnosis not present

## 2017-10-10 DIAGNOSIS — I5032 Chronic diastolic (congestive) heart failure: Secondary | ICD-10-CM | POA: Diagnosis not present

## 2017-10-10 DIAGNOSIS — Y939 Activity, unspecified: Secondary | ICD-10-CM | POA: Diagnosis not present

## 2017-10-10 DIAGNOSIS — N186 End stage renal disease: Secondary | ICD-10-CM | POA: Insufficient documentation

## 2017-10-10 DIAGNOSIS — Y92129 Unspecified place in nursing home as the place of occurrence of the external cause: Secondary | ICD-10-CM | POA: Diagnosis not present

## 2017-10-10 DIAGNOSIS — I132 Hypertensive heart and chronic kidney disease with heart failure and with stage 5 chronic kidney disease, or end stage renal disease: Secondary | ICD-10-CM | POA: Insufficient documentation

## 2017-10-10 DIAGNOSIS — Y999 Unspecified external cause status: Secondary | ICD-10-CM | POA: Insufficient documentation

## 2017-10-10 DIAGNOSIS — W1830XA Fall on same level, unspecified, initial encounter: Secondary | ICD-10-CM | POA: Diagnosis not present

## 2017-10-10 DIAGNOSIS — S79922A Unspecified injury of left thigh, initial encounter: Secondary | ICD-10-CM | POA: Diagnosis present

## 2017-10-10 DIAGNOSIS — Z87891 Personal history of nicotine dependence: Secondary | ICD-10-CM | POA: Diagnosis not present

## 2017-10-10 DIAGNOSIS — W19XXXA Unspecified fall, initial encounter: Secondary | ICD-10-CM

## 2017-10-10 MED ORDER — ACETAMINOPHEN 500 MG PO TABS
1000.0000 mg | ORAL_TABLET | Freq: Once | ORAL | Status: AC
  Administered 2017-10-10: 1000 mg via ORAL
  Filled 2017-10-10: qty 2

## 2017-10-10 NOTE — ED Notes (Signed)
[facility] states [facility] will pick the pt up, there transportation will be available after 1030.Marland Kitchen

## 2017-10-10 NOTE — ED Notes (Signed)
Pt fell on Saturday and has developed swelling and pain to the left hip area. Pt also c/o BL knee pain at this time. Pt is alert and oriented. Will continue to monitor the pt.

## 2017-10-10 NOTE — ED Provider Notes (Signed)
[facility] Emergency Department Provider Note    First MD Initiated Contact with Patient 10/10/17 [phone]     (approximate)  I have reviewed the triage vital signs and the nursing notes.   HISTORY  Chief Complaint Fall    HPI Toni Carlson is a 72 y.o. female with extensive chronic past medical history presents to the ER from [place] after a fall that occurred 2 days ago with subsequently developing bruising to the left hip.  Also complaining of left knee pain.  Did not hit her head.  States that she did not seek medical care then because she "knows her rights, and I hate hospitals.".  Says the pain is mild.  She is able to move her leg.  Denies any other symptoms or complaints at this time.  Past Medical History:  Diagnosis Date  . A-fib (HCC)    a. on warfarin  . Anemia   . Carotid artery stenosis    a. ultrasound 03/2015: RICA 25-42% stenosis, LICA less than 70% stenosis  . Chronic diastolic CHF (congestive heart failure) ([place])    a. echo 03/2015: EF 60-65%, normal wall motion, diastolic parameters were c/w restrictive physiology, indicative of decreased LV diastolic compliance and/or increased LA pressure, mild AS, mild MR, left atrium was severely dilated, RA was severely dilated, PASP was severely increased at 90 mm Hg  . Coronary artery disease, non-occlusive    a. cardiac cath 2013  . ESRD (end stage renal disease) ([place])    On Tue-Thur-Sat dialysis  . H/O ischemic right MCA stroke    a. 03/2015; b. residual left-sided facial droop and slurred speech  . Hyperparathyroidism, secondary renal ([place])   . Hypertension   . Nicotine dependence   . Pulmonary HTN ([place])    as above  . Symptomatic bradycardia    a. history of symptomatic bradycardia; b. felt to be 2/2 metabolic abnormalities & required temp wire but no PPM, resolved with HD   Family History  Problem Relation Age of Onset  . CAD Father   . Dementia Mother   . Lupus Sister     Past Surgical History:  Procedure Laterality Date  . A/V FISTULAGRAM Left 10/25/2016   Procedure: A/V Fistulagram;  Surgeon: Katha Cabal, MD;  Location: [place] CV LAB;  Service: Cardiovascular;  Laterality: Left;  . COLONOSCOPY WITH PROPOFOL N/A 12/19/2016   Procedure: COLONOSCOPY WITH PROPOFOL;  Surgeon: Jonathon Bellows, MD;  Location: [facility] ENDOSCOPY;  Service: Gastroenterology;  Laterality: N/A;  . ESOPHAGOGASTRODUODENOSCOPY (EGD) WITH PROPOFOL N/A 04/14/2015   Procedure: ESOPHAGOGASTRODUODENOSCOPY (EGD) WITH PROPOFOL;  Surgeon: Lucilla Lame, MD;  Location: [facility] ENDOSCOPY;  Service: Endoscopy;  Laterality: N/A;  . ESOPHAGOGASTRODUODENOSCOPY (EGD) WITH PROPOFOL N/A 07/17/2015   Procedure: ESOPHAGOGASTRODUODENOSCOPY (EGD) WITH PROPOFOL;  Surgeon: Josefine Class, MD;  Location: [facility] ENDOSCOPY;  Service: Endoscopy;  Laterality: N/A;  . ESOPHAGOGASTRODUODENOSCOPY (EGD) WITH PROPOFOL N/A 12/19/2016   Procedure: ESOPHAGOGASTRODUODENOSCOPY (EGD) WITH PROPOFOL;  Surgeon: Jonathon Bellows, MD;  Location: [facility] ENDOSCOPY;  Service: Gastroenterology;  Laterality: N/A;  . EUS N/A 09/24/2015   Procedure: UPPER ENDOSCOPIC ULTRASOUND (EUS) LINEAR;  Surgeon: Jola Schmidt, MD;  Location: [facility] ENDOSCOPY;  Service: Endoscopy;  Laterality: N/A;  . LOWER EXTREMITY ANGIOGRAPHY Right 06/09/2016   Procedure: Lower Extremity Angiography;  Surgeon: Algernon Huxley, MD;  Location: [place] CV LAB;  Service: Cardiovascular;  Laterality: Right;  . PERIPHERAL VASCULAR CATHETERIZATION N/A 02/23/2015   Procedure: A/V Shuntogram/Fistulagram;  Surgeon: Algernon Huxley, MD;  Location: [place] CV LAB;  Service: Cardiovascular;  Laterality: N/A;  . PERIPHERAL VASCULAR CATHETERIZATION N/A 02/23/2015   Procedure: A/V Shunt Intervention;  Surgeon: Algernon Huxley, MD;  Location: [place] CV LAB;  Service: Cardiovascular;  Laterality: N/A;  . VISCERAL ANGIOGRAPHY N/A 04/04/2017   Procedure: VISCERAL ANGIOGRAPHY;  Surgeon:  Katha Cabal, MD;  Location: [place] CV LAB;  Service: Cardiovascular;  Laterality: N/A;   Patient Active Problem List   Diagnosis Date Noted  . Complication of AV dialysis fistula 04/17/2017  . 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 Hyperlipidemia 03/16/2017  . Complication from renal dialysis device 11/27/2016  . Gastric polyp 08/18/2015  . Anemia 07/15/2015  . GI bleed 07/15/2015  . Polyneuropathy 07/08/2015  . Degenerative disc disease, lumbar 07/08/2015  . Pancytopenia ([place]) 07/08/2015  . Generalized weakness 07/08/2015  . End stage renal disease on dialysis ([place]) 07/08/2015  . Atherosclerotic peripheral vascular disease ([place]) 07/08/2015  . Pain in both feet 07/02/2015  . Gastric ulceration   . Acute GI bleeding   . GIB (gastrointestinal bleeding) 04/12/2015  . Left-sided weakness 04/01/2015  . Essential hypertension 04/01/2015  . Anemia of chronic disease 04/01/2015  . Elevated troponin 04/01/2015  . Pulmonary hypertension ([place]) 04/01/2015  . Atrial fibrillation ([place])   . CVA (cerebral infarction) 03/26/2015      Prior to Admission medications   Medication Sig Start Date End Date Taking? Authorizing Provider  acetaminophen (TYLENOL) 500 MG tablet Take 500 mg by mouth every 4 (four) hours as needed for fever.    [provider]  alum & mag hydroxide-simeth (MINTOX) 200-200-20 MG/5ML suspension Take 30 mLs every 6 (six) hours as needed by mouth for indigestion or heartburn.    [provider]  amLODipine (NORVASC) 10 MG tablet Take 1 tablet (10 mg total) by mouth daily. Patient taking differently: Take 10 mg by mouth at bedtime.  09/19/15   Carrie Mew, MD  atorvastatin (LIPITOR) 40 MG tablet Take 1 tablet (40 mg total) by mouth daily. Patient taking differently: Take 40 mg by mouth at bedtime.  09/19/15   Carrie Mew, MD  calcium acetate (PHOSLO) 667 MG capsule Take 1,334 mg by mouth 3 (three) times daily.    [provider]  carvedilol (COREG) 6.25 MG tablet Take 1 tablet (6.25 mg total) by mouth 2 (two) times daily with a meal. 09/19/15   Carrie Mew, MD  cetirizine (ZYRTEC) 10 MG tablet Take 10 mg by mouth daily.    [provider]  clopidogrel (PLAVIX) 75 MG tablet Take 75 mg daily by mouth.    [provider]  escitalopram (LEXAPRO) 10 MG tablet Take 10 mg by mouth daily.    [provider]  gabapentin (NEURONTIN) 300 MG capsule Take 300 mg by mouth at bedtime.    [provider]  guaifenesin (ROBITUSSIN) 100 MG/5ML syrup Take 200 mg by mouth every 6 (six) hours as needed for cough.    [provider]  loperamide (IMODIUM) 2 MG capsule Take 2 mg by mouth as needed for diarrhea or loose stools.    [provider]  losartan (COZAAR) 100 MG tablet Take 1 tablet (100 mg total) by mouth daily. Patient taking differently: Take 100 mg by mouth at bedtime.  09/19/15   Carrie Mew, MD  magnesium hydroxide (MILK OF MAGNESIA) 400 MG/5ML suspension Take 30 mLs by mouth at bedtime as needed for mild constipation.    [provider]  neomycin-bacitracin-polymyxin (NEOSPORIN)  ointment Apply 1 application topically as needed for wound care. apply to eye    [provider]  ondansetron (ZOFRAN) 4 MG tablet Take 4 mg by mouth every 8 (eight) hours as needed for nausea or vomiting.    [provider]  pantoprazole (PROTONIX) 40 MG tablet Take 1 tablet (40 mg total) by mouth 2 (two) times daily. 09/19/15   Carrie Mew, MD  polyethylene glycol [facility] Floria Raveling) packet Take 17 g by mouth daily as needed for mild constipation. 09/19/15   Carrie Mew, MD  spironolactone (ALDACTONE) 25 MG tablet Take 1 tablet (25 mg total) by mouth daily. 12/20/16   Fritzi Mandes, MD  traZODone (DESYREL) 50 MG tablet Take 25 mg by mouth at bedtime.  05/18/16   [provider]  trolamine salicylate (ASPERCREME) 10 % cream Apply topically 2  (two) times daily as needed for muscle pain. 09/19/15   Carrie Mew, MD    Allergies Patient has no known allergies.    Social History Social History   Tobacco Use  . Smoking status: Former Smoker    Packs/day: 0.25    Years: 30.00    Pack years: 7.50    Types: Cigarettes    Last attempt to quit: 11/20/2013    Years since quitting: 3.8  . Smokeless tobacco: Never Used  Substance Use Topics  . Alcohol use: No    Alcohol/week: 0.0 oz  . Drug use: No    Review of Systems Patient denies headaches, rhinorrhea, blurry vision, numbness, shortness of breath, chest pain, edema, cough, abdominal pain, nausea, vomiting, diarrhea, dysuria, fevers, rashes or hallucinations unless otherwise stated above in HPI. ____________________________________________   PHYSICAL EXAM:  VITAL SIGNS: Vitals:   10/10/17 0830 10/10/17 0833  BP:  138/71  Pulse:  (!) 54  Resp:  18  Temp:  98.4 F (36.9 C)  SpO2: 98% 93%    Constitutional: Alert and  in no acute distress. Eyes: Conjunctivae are normal.  Head: Atraumatic. Nose: No congestion/rhinnorhea. Mouth/Throat: Mucous membranes are moist.   Neck: No stridor. Painless ROM.  Cardiovascular: Normal rate, regular rhythm. Grossly normal heart sounds.  Good peripheral circulation. Respiratory: Normal respiratory effort.  No retractions. Lungs CTAB. Gastrointestinal: Soft and nontender. No distention. No abdominal bruits. No CVA tenderness. Genitourinary: deferred Musculoskeletal: contusion to left lateral thigh, no pain with log roll or hip flexion/extension, pain with palpation ofL knee without deformity.  No joint effusions. Neurologic:  Normal speech and language. No gross focal neurologic deficits are appreciated. No facial droop Skin:  Skin is warm, dry and intact. No rash noted.   ____________________________________________   LABS (all labs ordered are listed, but only abnormal results are displayed)  No results found for this  or any previous visit (from the past 24 hour(s)). ____________________________________________ ________________________________  [id number]  I personally reviewed all radiographic images ordered to evaluate for the above acute complaints and reviewed radiology reports and findings.  These findings were personally discussed with the patient.  Please see medical record for radiology report.  ____________________________________________   PROCEDURES  Procedure(s) performed:  Procedures    Critical Care performed: no ____________________________________________   INITIAL IMPRESSION / ASSESSMENT AND PLAN / ED COURSE  Pertinent labs & imaging results that were available during my care of the patient were reviewed by me and considered in my medical decision making (see chart for details).  DDX: contusion, fracture, dislocation  Grey Schlauch is a 72 y.o. who presents to the ED with contusion left  hip after fall 2 days ago complaining of left hip and knee pain.  X-rays ordered to evaluate for fracture show none.  Patient without any other additional complaints.  No evidence of other emergent medical condition at this time.  Patient stable and appropriate for continued outpatient follow-up.      As part of my medical decision making, I reviewed the following data within the [place] notes reviewed and incorporated, Labs reviewed, notes from prior ED visits.   ____________________________________________   FINAL CLINICAL IMPRESSION(S) / ED DIAGNOSES  Final diagnoses:  Fall, initial encounter  Contusion of left thigh, initial encounter      NEW MEDICATIONS STARTED DURING THIS VISIT:  New Prescriptions   No medications on file     Note:  This document was prepared using Dragon voice recognition software and may include unintentional dictation errors.    Merlyn Lot, MD 10/10/17 1021

## 2017-10-10 NOTE — ED Triage Notes (Signed)
Pt via ems from [place] house after a fall 2 days ago. Today she has developed a "knot" on her left hip with bruising and pain, so they sent her here for evaluation. Pt alert & oriented, c/o pain to left hip, worse upon palpation, as well as bilateral knee pain and bruising to left knee

## 2017-10-10 NOTE — ED Notes (Signed)
Pt sitting up at the bedside drinking coffee and eating peanut butter and crackers.

## 2017-10-19 ENCOUNTER — Ambulatory Visit (INDEPENDENT_AMBULATORY_CARE_PROVIDER_SITE_OTHER): Payer: Medicare Other | Admitting: Vascular Surgery

## 2017-10-19 ENCOUNTER — Encounter (INDEPENDENT_AMBULATORY_CARE_PROVIDER_SITE_OTHER): Payer: Self-pay | Admitting: Vascular Surgery

## 2017-10-19 ENCOUNTER — Ambulatory Visit (INDEPENDENT_AMBULATORY_CARE_PROVIDER_SITE_OTHER): Payer: Medicare Other

## 2017-10-19 VITALS — BP 131/70 | HR 50 | Resp 16 | Ht 63.0 in | Wt 160.0 lb

## 2017-10-19 DIAGNOSIS — K551 Chronic vascular disorders of intestine: Secondary | ICD-10-CM | POA: Diagnosis not present

## 2017-10-19 DIAGNOSIS — N186 End stage renal disease: Secondary | ICD-10-CM

## 2017-10-19 DIAGNOSIS — T829XXS Unspecified complication of cardiac and vascular prosthetic device, implant and graft, sequela: Secondary | ICD-10-CM | POA: Diagnosis not present

## 2017-10-19 DIAGNOSIS — I10 Essential (primary) hypertension: Secondary | ICD-10-CM

## 2017-10-19 DIAGNOSIS — I70209 Unspecified atherosclerosis of native arteries of extremities, unspecified extremity: Secondary | ICD-10-CM | POA: Diagnosis not present

## 2017-10-19 DIAGNOSIS — Z992 Dependence on renal dialysis: Secondary | ICD-10-CM | POA: Diagnosis not present

## 2017-10-19 DIAGNOSIS — I480 Paroxysmal atrial fibrillation: Secondary | ICD-10-CM | POA: Diagnosis not present

## 2017-10-19 NOTE — Progress Notes (Signed)
MRN : [id number]  Toni Carlson is a 72 y.o. (03/17/1946) female who presents with chief complaint of mesenteric stenosis  Problems with dialysis access.  History of Present Illness:   The patient returns to the office for followup and review of the noninvasive studies. She is s/p PTA and stenting of the celiac artery on 04/04/2017.  The patient is also seen for followup of their dialysis access. The function of the access has been stable. The patient denies increased bleeding time or increased recirculation. Patient denies difficulty with cannulation. The patient denies hand pain or other symptoms consistent with steal phenomena.  No significant arm swelling.  The patient denies redness or swelling at the access site. The patient denies fever or chills at home or while on dialysis.  There have been no significant changes to the patient's overall health care.  The patient denies amaurosis fugax or recent TIA symptoms. There are no recent neurological changes noted. The patient denies history of DVT, PE or superficial thrombophlebitis. The patient denies recent episodes of angina or shortness of breath.   Duplex ultrasound of the mesenteric shows a widely patent celiac stent and widely patent SMA  Duplex ultrasound of the AV access shows the chronic stricture which is stable stent is patent    No outpatient medications have been marked as taking for the 10/19/17 encounter (Appointment) with Delana Meyer, Dolores Lory, MD.    Past Medical History:  Diagnosis Date  . A-fib (HCC)    a. on warfarin  . Anemia   . Carotid artery stenosis    a. ultrasound 03/2015: RICA 64-40% stenosis, LICA less than 34% stenosis  . Chronic diastolic CHF (congestive heart failure) ([place])    a. echo 03/2015: EF 60-65%, normal wall motion, diastolic parameters were c/w restrictive physiology, indicative of decreased LV diastolic compliance and/or increased LA pressure, mild AS, mild MR, left atrium was severely  dilated, RA was severely dilated, PASP was severely increased at 90 mm Hg  . Coronary artery disease, non-occlusive    a. cardiac cath 2013  . ESRD (end stage renal disease) ([place])    On Tue-Thur-Sat dialysis  . H/O ischemic right MCA stroke    a. 03/2015; b. residual left-sided facial droop and slurred speech  . Hyperparathyroidism, secondary renal ([place])   . Hypertension   . Nicotine dependence   . Pulmonary HTN ([place])    as above  . Symptomatic bradycardia    a. history of symptomatic bradycardia; b. felt to be 2/2 metabolic abnormalities & required temp wire but no PPM, resolved with HD    Past Surgical History:  Procedure Laterality Date  . A/V FISTULAGRAM Left 10/25/2016   Procedure: A/V Fistulagram;  Surgeon: Katha Cabal, MD;  Location: [place] CV LAB;  Service: Cardiovascular;  Laterality: Left;  . COLONOSCOPY WITH PROPOFOL N/A 12/19/2016   Procedure: COLONOSCOPY WITH PROPOFOL;  Surgeon: Jonathon Bellows, MD;  Location: [facility] ENDOSCOPY;  Service: Gastroenterology;  Laterality: N/A;  . ESOPHAGOGASTRODUODENOSCOPY (EGD) WITH PROPOFOL N/A 04/14/2015   Procedure: ESOPHAGOGASTRODUODENOSCOPY (EGD) WITH PROPOFOL;  Surgeon: Lucilla Lame, MD;  Location: [facility] ENDOSCOPY;  Service: Endoscopy;  Laterality: N/A;  . ESOPHAGOGASTRODUODENOSCOPY (EGD) WITH PROPOFOL N/A 07/17/2015   Procedure: ESOPHAGOGASTRODUODENOSCOPY (EGD) WITH PROPOFOL;  Surgeon: Josefine Class, MD;  Location: [facility] ENDOSCOPY;  Service: Endoscopy;  Laterality: N/A;  . ESOPHAGOGASTRODUODENOSCOPY (EGD) WITH PROPOFOL N/A 12/19/2016   Procedure: ESOPHAGOGASTRODUODENOSCOPY (EGD) WITH PROPOFOL;  Surgeon: Jonathon Bellows, MD;  Location: [facility] ENDOSCOPY;  Service: Gastroenterology;  Laterality:  N/A;  . EUS N/A 09/24/2015   Procedure: UPPER ENDOSCOPIC ULTRASOUND (EUS) LINEAR;  Surgeon: Jola Schmidt, MD;  Location: [facility] ENDOSCOPY;  Service: Endoscopy;  Laterality: N/A;  . LOWER EXTREMITY ANGIOGRAPHY Right 06/09/2016   Procedure: Lower Extremity  Angiography;  Surgeon: Algernon Huxley, MD;  Location: [place] CV LAB;  Service: Cardiovascular;  Laterality: Right;  . PERIPHERAL VASCULAR CATHETERIZATION N/A 02/23/2015   Procedure: A/V Shuntogram/Fistulagram;  Surgeon: Algernon Huxley, MD;  Location: [place] CV LAB;  Service: Cardiovascular;  Laterality: N/A;  . PERIPHERAL VASCULAR CATHETERIZATION N/A 02/23/2015   Procedure: A/V Shunt Intervention;  Surgeon: Algernon Huxley, MD;  Location: [place] CV LAB;  Service: Cardiovascular;  Laterality: N/A;  . VISCERAL ANGIOGRAPHY N/A 04/04/2017   Procedure: VISCERAL ANGIOGRAPHY;  Surgeon: Katha Cabal, MD;  Location: [place] CV LAB;  Service: Cardiovascular;  Laterality: N/A;    Social History Social History   Tobacco Use  . Smoking status: Former Smoker    Packs/day: 0.25    Years: 30.00    Pack years: 7.50    Types: Cigarettes    Last attempt to quit: 11/20/2013    Years since quitting: 3.9  . Smokeless tobacco: Never Used  Substance Use Topics  . Alcohol use: No    Alcohol/week: 0.0 oz  . Drug use: No    Family History Family History  Problem Relation Age of Onset  . CAD Father   . Dementia Mother   . Lupus Sister     No Known Allergies   REVIEW OF SYSTEMS (Negative unless checked)  Constitutional: [] Weight loss  [] Fever  [] Chills Cardiac: [] Chest pain   [] Chest pressure   [] Palpitations   [] Shortness of breath when laying flat   [] Shortness of breath with exertion. Vascular:  [] Pain in legs with walking   [] Pain in legs at rest  [] History of DVT   [] Phlebitis   [] Swelling in legs   [] Varicose veins   [] Non-healing ulcers Pulmonary:   [] Uses home oxygen   [] Productive cough   [] Hemoptysis   [] Wheeze  [] COPD   [] Asthma Neurologic:  [] Dizziness   [] Seizures   [] History of stroke   [] History of TIA  [] Aphasia   [] Vissual changes   [] Weakness or numbness in arm   [] Weakness or numbness in leg Musculoskeletal:   [] Joint swelling   [] Joint pain   [] Low back  pain Hematologic:  [] Easy bruising  [] Easy bleeding   [] Hypercoagulable state   [] Anemic Gastrointestinal:  [] Diarrhea   [] Vomiting  [] Gastroesophageal reflux/heartburn   [] Difficulty swallowing. Genitourinary:  [x] Chronic kidney disease   [] Difficult urination  [] Frequent urination   [] Blood in urine Skin:  [] Rashes   [] Ulcers  Psychological:  [] History of anxiety   []  History of major depression.  Physical Examination  There were no vitals filed for this visit. There is no height or weight on file to calculate BMI. Gen: WD/WN, NAD; seen in a wheelchair Head: [place]/AT, No temporalis wasting.  Ear/Nose/Throat: Hearing grossly intact, nares w/o erythema or drainage Eyes: PER, EOMI, sclera nonicteric.  Neck: Supple, no large masses.   Pulmonary:  Good air movement, no audible wheezing bilaterally, no use of accessory muscles.  Cardiac: RRR, no JVD Vascular: left arm AV fistula good thrill good bruit, aneurysmal segmetn with skin changes but no ulcer or necrosis Vessel Right Left  Radial Palpable Palpable  Ulnar Palpable Palpable  Brachial Palpable Palpable  Gastrointestinal: Non-distended. No guarding/no peritoneal signs.  Musculoskeletal: M/S 3/5 throughout legs.  No  deformity but leg atrophy noted.  Neurologic: CN 2-12 intact. Symmetrical.  Speech is fluent. Motor exam as listed above. Psychiatric: Judgment intact, Mood & affect appropriate for pt's clinical situation. Dermatologic: No rashes or ulcers noted.  No changes consistent with cellulitis. Lymph : No lichenification or skin changes of chronic lymphedema.  CBC Lab Results  Component Value Date   WBC 5.9 01/20/2017   HGB 8.7 (L) 01/21/2017   HCT 26.1 (L) 01/20/2017   MCV 89.5 01/20/2017   PLT 161 01/20/2017    BMET    Component Value Date/Time   NA 137 01/20/2017 0833   NA 137 11/26/2013 0650   K 4.2 01/20/2017 0833   K 5.2 (H) 11/26/2013 0650   CL 95 (L) 01/20/2017 0833   CL 102 11/26/2013 0650   CO2 29  01/20/2017 0833   CO2 26 11/26/2013 0650   GLUCOSE 82 01/20/2017 0833   GLUCOSE 87 11/26/2013 0650   BUN 40 (H) 01/20/2017 0833   BUN 52 (H) 11/26/2013 0650   CREATININE 6.15 (H) 01/20/2017 0833   CREATININE 9.20 (H) 11/26/2013 0650   CALCIUM 8.2 (L) 01/20/2017 0833   CALCIUM 8.8 11/26/2013 0650   GFRNONAA 6 (L) 01/20/2017 0833   GFRNONAA 4 (L) 11/26/2013 0650   GFRAA 7 (L) 01/20/2017 0833   GFRAA 5 (L) 11/26/2013 0650   CrCl cannot be calculated (Patient's most recent lab result is older than the maximum 21 days allowed.).  COAG Lab Results  Component Value Date   INR 1.25 12/17/2016   INR 1.11 11/16/2016   INR 0.98 05/12/2016    Radiology Dg Knee Complete 4 Views Left  Result Date: 10/10/2017 CLINICAL DATA:  [place] 2 days ago.  Bruising of the left knee. EXAM: LEFT KNEE - COMPLETE 4+ VIEW COMPARISON:  11/15/2012 FINDINGS: Generalized osteopenia. No joint effusion. No evidence of fracture or dislocation. No significant degenerative changes. Regional arterial calcification is noted. IMPRESSION: No acute or traumatic finding. Electronically Signed   By: Nelson Chimes M.D.   On: 10/10/2017 09:04   Dg Hip Unilat W Or Wo Pelvis 2-3 Views Left  Result Date: 10/10/2017 CLINICAL DATA:  Left hip pain after fall.  Initial encounter. EXAM: DG HIP (WITH OR WITHOUT PELVIS) 2-3V LEFT COMPARISON:  04/17/2015 FINDINGS: Remote left obturator ring fracture with healed displacement. Both hips are located and appear intact. Osteopenia. Confluent atherosclerotic calcification with interval right outflow stenting. IMPRESSION: 1. No acute finding. 2. Remote left obturator ring fracture. 3. Prominent osteopenia. Electronically Signed   By: Monte Fantasia M.D.   On: 10/10/2017 09:06    Assessment/Plan 1. Mesenteric artery stenosis (HCC) Recommend:  The patient is status post successful angiogram with intervention of the mesenteric vessels.  Celiac stenting was performed.  The patient reports that the  abdominal pain is improved and the post prandial symptoms are essentially gone.   The patient denies lifestyle limiting changes at this point in time.  No further invasive studies, angiography or surgery at this time The patient should continue walking and begin a more formal exercise program.  The patient should continue antiplatelet therapy and aggressive treatment of the lipid abnormalities  Smoking cessation was again discussed  Patient should undergo noninvasive studies as ordered. The patient will follow up with me after the studies.    2. Complication of arteriovenous dialysis fistula, sequela Recommend:  The patient is doing well and currently has adequate dialysis access. The patient's dialysis center is not reporting any access issues. Flow pattern is  stable when compared to the prior ultrasound.  The patient should have a duplex ultrasound of the dialysis access in 6 months.  The patient will follow-up with me in the office after each ultrasound   3. End stage renal disease on dialysis ([place]) Continue HD without interuption  4. Atherosclerotic peripheral vascular disease (HCC)  Recommend:  The patient has evidence of atherosclerosis of the lower extremities with claudication.  The patient does not voice lifestyle limiting changes at this point in time.  Noninvasive studies do not suggest clinically significant change.  No invasive studies, angiography or surgery at this time The patient should continue walking and begin a more formal exercise program.  The patient should continue antiplatelet therapy and aggressive treatment of the lipid abnormalities  No changes in the patient's medications at this time  The patient should continue wearing graduated compression socks 10-15 mmHg strength to control the mild edema.    5. Paroxysmal atrial fibrillation (HCC) Continue antiarrhythmia medications as already ordered, these medications have been reviewed and there are no  changes at this time.  Continue anticoagulation as ordered by Cardiology Service   6. Essential hypertension Continue antihypertensive medications as already ordered, these medications have been reviewed and there are no changes at this time.     Hortencia Pilar, MD  10/19/2017 9:48 AM

## 2018-01-04 ENCOUNTER — Encounter (INDEPENDENT_AMBULATORY_CARE_PROVIDER_SITE_OTHER): Payer: Medicare Other

## 2018-01-04 ENCOUNTER — Ambulatory Visit (INDEPENDENT_AMBULATORY_CARE_PROVIDER_SITE_OTHER): Payer: Medicare Other | Admitting: Vascular Surgery

## 2018-01-17 ENCOUNTER — Encounter (INDEPENDENT_AMBULATORY_CARE_PROVIDER_SITE_OTHER): Payer: Self-pay

## 2018-01-17 ENCOUNTER — Telehealth (INDEPENDENT_AMBULATORY_CARE_PROVIDER_SITE_OTHER): Payer: Self-pay | Admitting: Vascular Surgery

## 2018-01-17 NOTE — Telephone Encounter (Signed)
Patient is scheduled for her angiogram on 01/23/18 with Schnier.

## 2018-01-19 ENCOUNTER — Other Ambulatory Visit (INDEPENDENT_AMBULATORY_CARE_PROVIDER_SITE_OTHER): Payer: Self-pay | Admitting: Nurse Practitioner

## 2018-01-22 MED ORDER — CEFAZOLIN SODIUM-DEXTROSE 1-4 GM/50ML-% IV SOLN: 1.0000 g | Freq: Once | INTRAVENOUS | Status: DC

## 2018-01-23 ENCOUNTER — Ambulatory Visit
Admission: RE | Admit: 2018-01-23 | Discharge: 2018-01-23 | Disposition: A | Discharge status: Home / Self Care | Payer: Medicare Other | Source: Ambulatory Visit | Attending: Vascular Surgery | Admitting: Vascular Surgery

## 2018-01-23 ENCOUNTER — Encounter
Admission: RE | Disposition: A | Discharge status: Home / Self Care | Payer: Self-pay | Source: Ambulatory Visit | Attending: Vascular Surgery

## 2018-01-23 DIAGNOSIS — I6523 Occlusion and stenosis of bilateral carotid arteries: Secondary | ICD-10-CM | POA: Diagnosis not present

## 2018-01-23 DIAGNOSIS — T82858A Stenosis of vascular prosthetic devices, implants and grafts, initial encounter: Secondary | ICD-10-CM | POA: Insufficient documentation

## 2018-01-23 DIAGNOSIS — I4891 Unspecified atrial fibrillation: Secondary | ICD-10-CM | POA: Insufficient documentation

## 2018-01-23 DIAGNOSIS — I2510 Atherosclerotic heart disease of native coronary artery without angina pectoris: Secondary | ICD-10-CM

## 2018-01-23 DIAGNOSIS — I5032 Chronic diastolic (congestive) heart failure: Secondary | ICD-10-CM | POA: Insufficient documentation

## 2018-01-23 DIAGNOSIS — Z8249 Family history of ischemic heart disease and other diseases of the circulatory system: Secondary | ICD-10-CM | POA: Diagnosis not present

## 2018-01-23 DIAGNOSIS — I272 Pulmonary hypertension, unspecified: Secondary | ICD-10-CM | POA: Insufficient documentation

## 2018-01-23 DIAGNOSIS — K219 Gastro-esophageal reflux disease without esophagitis: Secondary | ICD-10-CM | POA: Diagnosis not present

## 2018-01-23 DIAGNOSIS — Z955 Presence of coronary angioplasty implant and graft: Secondary | ICD-10-CM | POA: Insufficient documentation

## 2018-01-23 DIAGNOSIS — Z9889 Other specified postprocedural states: Secondary | ICD-10-CM | POA: Diagnosis not present

## 2018-01-23 DIAGNOSIS — Z992 Dependence on renal dialysis: Secondary | ICD-10-CM | POA: Diagnosis not present

## 2018-01-23 DIAGNOSIS — N2581 Secondary hyperparathyroidism of renal origin: Secondary | ICD-10-CM | POA: Insufficient documentation

## 2018-01-23 DIAGNOSIS — I132 Hypertensive heart and chronic kidney disease with heart failure and with stage 5 chronic kidney disease, or end stage renal disease: Secondary | ICD-10-CM | POA: Diagnosis not present

## 2018-01-23 DIAGNOSIS — I10 Essential (primary) hypertension: Secondary | ICD-10-CM

## 2018-01-23 DIAGNOSIS — T82868A Thrombosis of vascular prosthetic devices, implants and grafts, initial encounter: Secondary | ICD-10-CM | POA: Diagnosis not present

## 2018-01-23 DIAGNOSIS — N186 End stage renal disease: Secondary | ICD-10-CM

## 2018-01-23 DIAGNOSIS — Y832 Surgical operation with anastomosis, bypass or graft as the cause of abnormal reaction of the patient, or of later complication, without mention of misadventure at the time of the procedure: Secondary | ICD-10-CM | POA: Insufficient documentation

## 2018-01-23 DIAGNOSIS — Z87891 Personal history of nicotine dependence: Secondary | ICD-10-CM | POA: Insufficient documentation

## 2018-01-23 DIAGNOSIS — Z8673 Personal history of transient ischemic attack (TIA), and cerebral infarction without residual deficits: Secondary | ICD-10-CM | POA: Diagnosis not present

## 2018-01-23 HISTORY — PX: A/V FISTULAGRAM: CATH118298

## 2018-01-23 LAB — POTASSIUM (ARMC VASCULAR LAB ONLY): Potassium (ARMC vascular lab): 4.4 (ref 3.5–5.1)

## 2018-01-23 SURGERY — A/V FISTULAGRAM
Anesthesia: Moderate Sedation | Laterality: Left

## 2018-01-23 MED ORDER — FENTANYL CITRATE (PF) 100 MCG/2ML IJ SOLN
INTRAMUSCULAR | Status: DC | PRN
  Administered 2018-01-23 (×2): 50 ug via INTRAVENOUS

## 2018-01-23 MED ORDER — FENTANYL CITRATE (PF) 100 MCG/2ML IJ SOLN
INTRAMUSCULAR | Status: AC
  Filled 2018-01-23: qty 2

## 2018-01-23 MED ORDER — MIDAZOLAM HCL 2 MG/2ML IJ SOLN
INTRAMUSCULAR | Status: AC
  Filled 2018-01-23: qty 2

## 2018-01-23 MED ORDER — HYDROMORPHONE HCL 1 MG/ML IJ SOLN: 1.0000 mg | Freq: Once | INTRAMUSCULAR | Status: DC | PRN

## 2018-01-23 MED ORDER — HEPARIN (PORCINE) IN NACL 1000-0.9 UT/500ML-% IV SOLN
INTRAVENOUS | Status: AC
  Filled 2018-01-23: qty 1000

## 2018-01-23 MED ORDER — HEPARIN SODIUM (PORCINE) 1000 UNIT/ML IJ SOLN
INTRAMUSCULAR | Status: AC
  Filled 2018-01-23: qty 1

## 2018-01-23 MED ORDER — SODIUM CHLORIDE 0.9 % IV SOLN: INTRAVENOUS | Status: DC

## 2018-01-23 MED ORDER — ONDANSETRON HCL 4 MG/2ML IJ SOLN: 4.0000 mg | Freq: Four times a day (QID) | INTRAMUSCULAR | Status: DC | PRN

## 2018-01-23 MED ORDER — MIDAZOLAM HCL 2 MG/2ML IJ SOLN
INTRAMUSCULAR | Status: DC | PRN
  Administered 2018-01-23: 2 mg via INTRAVENOUS
  Administered 2018-01-23: 1 mg via INTRAVENOUS

## 2018-01-23 MED ORDER — HEPARIN SODIUM (PORCINE) 1000 UNIT/ML IJ SOLN
INTRAMUSCULAR | Status: DC | PRN
  Administered 2018-01-23: 3000 [IU] via INTRAVENOUS

## 2018-01-23 MED ORDER — CEFAZOLIN SODIUM-DEXTROSE 1-4 GM/50ML-% IV SOLN
INTRAVENOUS | Status: AC
  Administered 2018-01-23: 16:00:00
  Filled 2018-01-23: qty 50

## 2018-01-23 SURGICAL SUPPLY — 14 items
BALLN ATLAS 14X40X75 (BALLOONS) ×3
BALLN DORADO 8X40X80 (BALLOONS) ×3
BALLOON ATLAS 14X40X75 (BALLOONS) ×1 IMPLANT
BALLOON DORADO 8X40X80 (BALLOONS) ×1 IMPLANT
CATH BEACON 5 .035 65 KMP TIP (CATHETERS) ×3 IMPLANT
DEVICE PRESTO INFLATION (MISCELLANEOUS) ×3 IMPLANT
DRAPE BRACHIAL (DRAPES) ×3 IMPLANT
NEEDLE ENTRY 21GA 7CM ECHOTIP (NEEDLE) ×3 IMPLANT
PACK ANGIOGRAPHY (CUSTOM PROCEDURE TRAY) ×3 IMPLANT
SET INTRO CAPELLA COAXIAL (SET/KITS/TRAYS/PACK) ×3 IMPLANT
SHEATH BRITE TIP 6FRX5.5 (SHEATH) ×3 IMPLANT
SHEATH BRITE TIP 7FRX5.5 (SHEATH) ×3 IMPLANT
SUT MNCRL AB 4-0 PS2 18 (SUTURE) ×3 IMPLANT
WIRE MAGIC TORQUE 260C (WIRE) ×3 IMPLANT

## 2018-01-23 NOTE — H&P (Signed)
[facility] Admission History & Physical  MRN : [id number]  Toni Carlson is a 72 y.o. (09-24-1945) female who presents with chief complaint of problems with my dialysis access.  History of Present Illness: I am asked to evaluate the patient by the dialysis center. The patient was sent here because they were unable to achieve adequate dialysis this morning. Furthermore the Center states there is very poor thrill and bruit. The patient states there there have been increasing problems with the access, such as "pulling clots" during dialysis and prolonged bleeding after decannulation. The patient estimates these problems have been going on for several weeks. The patient is unaware of any other change.  Patient denies pain or tenderness overlying the access.  There is no pain with dialysis.  The patient denies hand pain or finger pain consistent with steal syndrome.   There have been past interventions or declots of this access.  The patient is not chronically hypotensive on dialysis.  Current Facility-Administered Medications  Medication Dose Route Frequency Provider Last Rate Last Dose  . 0.9 %  sodium chloride infusion   Intravenous Continuous Eulogio Ditch E, NP      . ceFAZolin (ANCEF) 1-4 GM/50ML-% IVPB           . ceFAZolin (ANCEF) IVPB 1 g/50 mL premix  1 g Intravenous Once Eulogio Ditch E, NP      . HYDROmorphone (DILAUDID) injection 1 mg  1 mg Intravenous Once PRN Kris Hartmann, NP      . ondansetron (ZOFRAN) injection 4 mg  4 mg Intravenous Q6H PRN Kris Hartmann, NP        Past Medical History:  Diagnosis Date  . A-fib (HCC)    a. on warfarin  . Anemia   . Carotid artery stenosis    a. ultrasound 03/2015: RICA 75-10% stenosis, LICA less than 25% stenosis  . Chronic diastolic CHF (congestive heart failure) ([place])    a. echo 03/2015: EF 60-65%, normal wall motion, diastolic parameters were c/w restrictive physiology, indicative of decreased LV  diastolic compliance and/or increased LA pressure, mild AS, mild MR, left atrium was severely dilated, RA was severely dilated, PASP was severely increased at 90 mm Hg  . Coronary artery disease, non-occlusive    a. cardiac cath 2013  . ESRD (end stage renal disease) ([place])    On Tue-Thur-Sat dialysis  . H/O ischemic right MCA stroke    a. 03/2015; b. residual left-sided facial droop and slurred speech  . Hyperparathyroidism, secondary renal ([place])   . Hypertension   . Nicotine dependence   . Pulmonary HTN ([place])    as above  . Symptomatic bradycardia    a. history of symptomatic bradycardia; b. felt to be 2/2 metabolic abnormalities & required temp wire but no PPM, resolved with HD    Past Surgical History:  Procedure Laterality Date  . A/V FISTULAGRAM Left 10/25/2016   Procedure: A/V Fistulagram;  Surgeon: Katha Cabal, MD;  Location: [facility];  Service: Cardiovascular;  Laterality: Left;  . COLONOSCOPY WITH PROPOFOL N/A 12/19/2016   Procedure: COLONOSCOPY WITH PROPOFOL;  Surgeon: Jonathon Bellows, MD;  Location[facility] ENDOSCOPY;  Service: Gastroenterology;  Laterality: N/A;  . ESOPHAGOGASTRODUODENOSCOPY (EGD) WITH PROPOFOL N/A 04/14/2015   Procedure: ESOPHAGOGASTRODUODENOSCOPY (EGD) WITH PROPOFOL;  Surgeon: Lucilla Lame, MD;  Location: [facility] ENDOSCOPY;  Service: Endoscopy;  Laterality: N/A;  . ESOPHAGOGASTRODUODENOSCOPY (EGD) WITH PROPOFOL N/A 07/17/2015   Procedure: ESOPHAGOGASTRODUODENOSCOPY (EGD) WITH PROPOFOL;  Surgeon: Rodman Key  Elmyra Ricks, MD;  Location: [facility] ENDOSCOPY;  Service: Endoscopy;  Laterality: N/A;  . ESOPHAGOGASTRODUODENOSCOPY (EGD) WITH PROPOFOL N/A 12/19/2016   Procedure: ESOPHAGOGASTRODUODENOSCOPY (EGD) WITH PROPOFOL;  Surgeon: Jonathon Bellows, MD;  Location: [facility] ENDOSCOPY;  Service: Gastroenterology;  Laterality: N/A;  . EUS N/A 09/24/2015   Procedure: UPPER ENDOSCOPIC ULTRASOUND (EUS) LINEAR;  Surgeon: Jola Schmidt, MD;  Location: [facility] ENDOSCOPY;  Service: Endoscopy;   Laterality: N/A;  . LOWER EXTREMITY ANGIOGRAPHY Right 06/09/2016   Procedure: Lower Extremity Angiography;  Surgeon: Algernon Huxley, MD;  Location: [place] [facility];  Service: Cardiovascular;  Laterality: Right;  . PERIPHERAL VASCULAR CATHETERIZATION N/A 02/23/2015   Procedure: A/V Shuntogram/Fistulagram;  Surgeon: Algernon Huxley, MD;  Location: [place] [facility];  Service: Cardiovascular;  Laterality: N/A;  . PERIPHERAL VASCULAR CATHETERIZATION N/A 02/23/2015   Procedure: A/V Shunt Intervention;  Surgeon: Algernon Huxley, MD;  Location: [place] [facility];  Service: Cardiovascular;  Laterality: N/A;  . VISCERAL ANGIOGRAPHY N/A 04/04/2017   Procedure: VISCERAL ANGIOGRAPHY;  Surgeon: Katha Cabal, MD;  Location: [place] [facility];  Service: Cardiovascular;  Laterality: N/A;    Social History Social History   Tobacco Use  . Smoking status: Former Smoker    Packs/day: 0.25    Years: 30.00    Pack years: 7.50    Types: Cigarettes    Last attempt to quit: 11/20/2013    Years since quitting: 4.1  . Smokeless tobacco: Never Used  Substance Use Topics  . Alcohol use: No    Alcohol/week: 0.0 standard drinks  . Drug use: No    Family History Family History  Problem Relation Age of Onset  . CAD Father   . Dementia Mother   . Lupus Sister     No family history of bleeding or clotting disorders, autoimmune disease or porphyria  No Known Allergies   REVIEW OF SYSTEMS (Negative unless checked)  Constitutional: [] Weight loss  [] Fever  [] Chills Cardiac: [] Chest pain   [] Chest pressure   [] Palpitations   [] Shortness of breath when laying flat   [] Shortness of breath at rest   [x] Shortness of breath with exertion. Vascular:  [] Pain in legs with walking   [] Pain in legs at rest   [] Pain in legs when laying flat   [] Claudication   [] Pain in feet when walking  [] Pain in feet at rest  [] Pain in feet when laying flat   [] History of DVT   [] Phlebitis   [] Swelling in legs   [] Varicose veins    [] Non-healing ulcers Pulmonary:   [] Uses home oxygen   [] Productive cough   [] Hemoptysis   [] Wheeze  [] COPD   [] Asthma Neurologic:  [] Dizziness  [] Blackouts   [] Seizures   [] History of stroke   [] History of TIA  [] Aphasia   [] Temporary blindness   [] Dysphagia   [] Weakness or numbness in arms   [] Weakness or numbness in legs Musculoskeletal:  [] Arthritis   [] Joint swelling   [] Joint pain   [] Low back pain Hematologic:  [] Easy bruising  [] Easy bleeding   [] Hypercoagulable state   [] Anemic  [] Hepatitis Gastrointestinal:  [] Blood in stool   [] Vomiting blood  [] Gastroesophageal reflux/heartburn   [] Difficulty swallowing. Genitourinary:  [x] Chronic kidney disease   [] Difficult urination  [] Frequent urination  [] Burning with urination   [] Blood in urine Skin:  [] Rashes   [] Ulcers   [] Wounds Psychological:  [] History of anxiety   []  History of major depression.  Physical Examination  Vitals:   01/23/18 1432  BP: (!) 146/57  Pulse: (!) 51  Resp: 17  Temp: 97.7 F (36.5 C)  TempSrc: Oral  SpO2: 98%  Weight: 70.3 kg  Height: 5\' 3"  (1.6 m)   Body mass index is 27.46 kg/m. Gen: WD/WN, NAD Head: [place]/AT, No temporalis wasting. Prominent temp pulse not noted. Ear/Nose/Throat: Hearing grossly intact, nares w/o erythema or drainage, oropharynx w/o Erythema/Exudate,  Eyes: Conjunctiva clear, sclera non-icteric Neck: Trachea midline.  No JVD.  Pulmonary:  Good air movement, respirations not labored, no use of accessory muscles.  Cardiac: RRR, normal S1, S2. 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 No guarding/reflex.  Musculoskeletal: M/S 5/5 throughout.  Extremities without ischemic changes.  No deformity or atrophy.  Neurologic: Sensation grossly intact  in extremities.  Symmetrical.  Speech is fluent. Motor exam as listed above. Psychiatric: Judgment intact, Mood & affect appropriate for pt's clinical situation. Dermatologic: No rashes or ulcers noted.  No cellulitis or open wounds. Lymph : No Cervical, Axillary, or Inguinal lymphadenopathy.   CBC Lab Results  Component Value Date   WBC 5.9 01/20/2017   HGB 8.7 (L) 01/21/2017   HCT 26.1 (L) 01/20/2017   MCV 89.5 01/20/2017   PLT 161 01/20/2017    BMET    Component Value Date/Time   NA 137 01/20/2017 0833   NA 137 11/26/2013 0650   K 4.2 01/20/2017 0833   K 5.2 (H) 11/26/2013 0650   CL 95 (L) 01/20/2017 0833   CL 102 11/26/2013 0650   CO2 29 01/20/2017 0833   CO2 26 11/26/2013 0650   GLUCOSE 82 01/20/2017 0833   GLUCOSE 87 11/26/2013 0650   BUN 40 (H) 01/20/2017 0833   BUN 52 (H) 11/26/2013 0650   CREATININE 6.15 (H) 01/20/2017 0833   CREATININE 9.20 (H) 11/26/2013 0650   CALCIUM 8.2 (L) 01/20/2017 0833   CALCIUM 8.8 11/26/2013 0650   GFRNONAA 6 (L) 01/20/2017 0833   GFRNONAA 4 (L) 11/26/2013 0650   GFRAA 7 (L) 01/20/2017 0833   GFRAA 5 (L) 11/26/2013 0650   CrCl cannot be calculated (Patient's most recent lab result is older than the maximum 21 days allowed.).  COAG Lab Results  Component Value Date   INR 1.25 12/17/2016   INR 1.11 11/16/2016   INR 0.98 05/12/2016    Radiology No results found.  Assessment/Plan 1.  Complication dialysis device with thrombosis AV access:  Patient's left arm dialysis access is malfunctioning. The patient will undergo angiography and correction of any problems using interventional techniques with the hope of restoring function to the access.  The risks and benefits were described to the patient.  All questions were answered.  The patient agrees to proceed with angiography and intervention. Potassium will be drawn to ensure that it is an appropriate level prior to performing intervention. 2.  End-stage renal disease requiring  hemodialysis:  Patient will continue dialysis therapy without further interruption if a successful intervention is not achieved then a tunneled catheter will be placed. Dialysis has already been arranged. 3.  Hypertension:  Patient will continue medical management; nephrology is following no changes in oral medications. 4. GERD:  Continue PPI as already ordered, this medication has been reviewed and there are no changes at this time.  Avoidence of caffeine and alcohol.  Moderate elevation of the head of the bed  5.  Coronary artery disease:  EKG will be monitored. Nitrates will be used if needed. The patient's oral cardiac medications will be continued.    Hortencia Pilar, MD  01/23/2018 3:41 PM

## 2018-01-23 NOTE — Discharge Instructions (Signed)

## 2018-01-23 NOTE — Op Note (Signed)
OPERATIVE NOTE   PROCEDURE: 1. Contrast injection left arm AV access 2. Percutaneous transluminal angioplasty peripheral segment to 8 mm with a Dorado balloon 3. Percutaneous transluminal angioplasty central venous segment to 14 mm with an Atlas balloon  PRE-OPERATIVE DIAGNOSIS: Complication of dialysis access                                                       End Stage Renal Disease  POST-OPERATIVE DIAGNOSIS: same as above   SURGEON: Katha Cabal, M.D.  ANESTHESIA: Conscious sedation was administered under my direct supervision by the interventional radiology RN. IV Versed plus fentanyl were utilized. Continuous ECG, pulse oximetry and blood pressure was monitored throughout the entire procedure.  Conscious sedation was for a total of 34 minutes.  ESTIMATED BLOOD LOSS: minimal  FINDING(S): Stricture of the AV graft within the peripheral segment as well as a stricture within the central venous portion  SPECIMEN(S):  None  CONTRAST: 32 cc  FLUOROSCOPY TIME: 4.5 minutes  INDICATIONS: Toni Carlson is a 72 y.o. female who  presents with malfunctioning left arm AV access.  The patient is scheduled for angiography with possible intervention of the AV access.  The patient is aware the risks include but are not limited to: bleeding, infection, thrombosis of the cannulated access, and possible anaphylactic reaction to the contrast.  The patient acknowledges if the access can not be salvaged a tunneled catheter will be needed and will be placed during this procedure.  The patient is aware of the risks of the procedure and elects to proceed with the angiogram and intervention.  DESCRIPTION: After full informed written consent was obtained, the patient was brought back to the Special Procedure suite and placed supine position.  Appropriate cardiopulmonary monitors were placed.  The left arm was prepped and draped in the standard fashion.  Appropriate timeout is called. The left AV  fistula was cannulated with a micropuncture needle.  Cannulation was performed with ultrasound guidance. Ultrasound was placed in a sterile sleeve, the AV access was interrogated and noted to be echolucent and compressible indicating patency. Image was recorded for the permanent record. The puncture is performed under continuous ultrasound visualization.   The microwire was advanced and the needle was exchanged for  a microsheath.  The J-wire was then advanced and a 6 Fr sheath inserted.  Hand injections were completed to image the access from the arterial anastomosis through the entire access.  The central venous structures were also imaged by hand injections.  Based on the images,  3000 units of heparin was given and a wire was negotiated through the strictures within the venous portion of the graft as well as the central stenosis. The sheath was then upsized to a 7 [place] sheath.  An 8 mm x 40 mm Dorado balloon was used.  Inflation was to 20 atm for 1 minute.  The detector was then repositioned over the peripheral portion of the AV access and the 8 mm x 40 mm Dorado balloon balloon was used to treat the stricture within the AV access. Inflation was to 16 atm for 1 minute.  With the 8 mm balloon inflated in the peripheral segment reflux imaging of the arterial anastomosis was performed.  This demonstrated that it was widely patent.  The balloon was deflated removed and follow-up imaging  of the peripheral lesion was performed.  On the initial films the peripheral lesion demonstrated an 80 to 85% stenosis.  After angioplasty there was less than 20% residual stenosis and the flow pattern was preserved.  A 14 mm x 40 mm Atlas balloon was then advanced through the 7 French sheath into the central venous system and the confluence of the left subclavian and the left innominate vein was angioplastied.  Inflation was to 18 atm for 1 minute.  What began as a string sign greater than 90% stenosis on follow-up imaging  was now less than 10% residual stenosis.  Follow-up imaging demonstrates significant improvement with a marked reduction of the strictures at both locations as noted above.  There is now rapid flow of contrast through the fistula and the central veins.  Arterial anastomosis is widely patent  A 4-0 Monocryl purse-string suture was sewn around the sheath.  The sheath was removed and light pressure was applied.  A sterile bandage was applied to the puncture site.    COMPLICATIONS: None  CONDITION: Toni Carlson, M.D [facility] Office: [phone]  01/23/2018 4:42 PM

## 2018-01-24 ENCOUNTER — Encounter: Payer: Self-pay | Admitting: Vascular Surgery

## 2018-01-24 ENCOUNTER — Telehealth (INDEPENDENT_AMBULATORY_CARE_PROVIDER_SITE_OTHER): Payer: Self-pay | Admitting: Vascular Surgery

## 2018-04-26 ENCOUNTER — Ambulatory Visit (INDEPENDENT_AMBULATORY_CARE_PROVIDER_SITE_OTHER): Payer: Medicare Other

## 2018-04-26 ENCOUNTER — Ambulatory Visit (INDEPENDENT_AMBULATORY_CARE_PROVIDER_SITE_OTHER): Payer: Medicare Other | Admitting: Vascular Surgery

## 2018-04-26 ENCOUNTER — Encounter (INDEPENDENT_AMBULATORY_CARE_PROVIDER_SITE_OTHER): Payer: Self-pay | Admitting: Vascular Surgery

## 2018-04-26 VITALS — BP 153/63 | HR 52 | Resp 17 | Ht 65.0 in | Wt 160.0 lb

## 2018-04-26 DIAGNOSIS — Z992 Dependence on renal dialysis: Secondary | ICD-10-CM

## 2018-04-26 DIAGNOSIS — T829XXA Unspecified complication of cardiac and vascular prosthetic device, implant and graft, initial encounter: Secondary | ICD-10-CM

## 2018-04-26 DIAGNOSIS — N186 End stage renal disease: Secondary | ICD-10-CM | POA: Diagnosis not present

## 2018-04-26 DIAGNOSIS — I480 Paroxysmal atrial fibrillation: Secondary | ICD-10-CM

## 2018-04-26 DIAGNOSIS — Z87891 Personal history of nicotine dependence: Secondary | ICD-10-CM

## 2018-04-26 DIAGNOSIS — T829XXS Unspecified complication of cardiac and vascular prosthetic device, implant and graft, sequela: Secondary | ICD-10-CM

## 2018-04-26 DIAGNOSIS — I10 Essential (primary) hypertension: Secondary | ICD-10-CM

## 2018-04-26 DIAGNOSIS — I120 Hypertensive chronic kidney disease with stage 5 chronic kidney disease or end stage renal disease: Secondary | ICD-10-CM | POA: Diagnosis not present

## 2018-04-26 DIAGNOSIS — I70209 Unspecified atherosclerosis of native arteries of extremities, unspecified extremity: Secondary | ICD-10-CM | POA: Diagnosis not present

## 2018-04-26 NOTE — Progress Notes (Signed)
MRN : [id number]  Toni Carlson is a 72 y.o. ([birth year]) female who presents with chief complaint of No chief complaint on file. Marland Kitchen  History of Present Illness:  The patient returns to the office for follow up regarding problem with the dialysis access. Currently the patient is maintained via a left arm brachial axillary graft.    Procedure done 01/23/2018: 1. Contrast injection left arm AV access 2. Percutaneous transluminal angioplasty peripheral segment to 8 mm with a Dorado balloon 3. Percutaneous transluminal angioplasty central venous segment to 14 mm with an Atlas balloon  The patient notes a significant increase in bleeding time after decannulation especially the upper needle.  The patient denies hand pain or other symptoms consistent with steal phenomena.  No significant arm swelling.  The patient denies redness or swelling at the access site. The patient denies fever or chills at home or while on dialysis.  The patient denies amaurosis fugax or recent TIA symptoms. There are no recent neurological changes noted. The patient denies claudication symptoms or rest pain symptoms. The patient denies history of DVT, PE or superficial thrombophlebitis. The patient denies recent episodes of angina or shortness of breath.    No outpatient medications have been marked as taking for the 04/26/18 encounter (Office Visit) with Delana Meyer, Dolores Lory, MD.    Past Medical History:  Diagnosis Date  . A-fib (HCC)    a. on warfarin  . Anemia   . Carotid artery stenosis    a. ultrasound 03/2015: RICA 57-01% stenosis, LICA less than 77% stenosis  . Chronic diastolic CHF (congestive heart failure) (Toni Carlson)    a. echo 03/2015: EF 60-65%, normal wall motion, diastolic parameters were c/w restrictive physiology, indicative of decreased LV diastolic compliance and/or increased LA pressure, mild AS, mild MR, left atrium was severely dilated, RA was severely dilated, PASP was severely increased at 90  mm Hg  . Coronary artery disease, non-occlusive    a. cardiac cath 2013  . ESRD (end stage renal disease) (Toni Carlson)    On Tue-Thur-Sat dialysis  . H/O ischemic right MCA stroke    a. 03/2015; b. residual left-sided facial droop and slurred speech  . Hyperparathyroidism, secondary renal (Toni Carlson)   . Hypertension   . Nicotine dependence   . Pulmonary HTN (Toni Carlson)    as above  . Symptomatic bradycardia    a. history of symptomatic bradycardia; b. felt to be 2/2 metabolic abnormalities & required temp wire but no PPM, resolved with HD    Past Surgical History:  Procedure Laterality Date  . A/V FISTULAGRAM Left 10/25/2016   Procedure: A/V Fistulagram;  Surgeon: Katha Cabal, MD;  Location: [place] [facility];  Service: Cardiovascular;  Laterality: Left;  . A/V FISTULAGRAM Left 01/23/2018   Procedure: A/V FISTULAGRAM;  Surgeon: Katha Cabal, MD;  Location: [place] [facility];  Service: Cardiovascular;  Laterality: Left;  . COLONOSCOPY WITH PROPOFOL N/A 12/19/2016   Procedure: COLONOSCOPY WITH PROPOFOL;  Surgeon: Jonathon Bellows, MD;  Location: [facility] ENDOSCOPY;  Service: Gastroenterology;  Laterality: N/A;  . ESOPHAGOGASTRODUODENOSCOPY (EGD) WITH PROPOFOL N/A 04/14/2015   Procedure: ESOPHAGOGASTRODUODENOSCOPY (EGD) WITH PROPOFOL;  Surgeon: Lucilla Lame, MD;  Location: [facility] ENDOSCOPY;  Service: Endoscopy;  Laterality: N/A;  . ESOPHAGOGASTRODUODENOSCOPY (EGD) WITH PROPOFOL N/A 07/17/2015   Procedure: ESOPHAGOGASTRODUODENOSCOPY (EGD) WITH PROPOFOL;  Surgeon: Josefine Class, MD;  Location: [facility] ENDOSCOPY;  Service: Endoscopy;  Laterality: N/A;  . ESOPHAGOGASTRODUODENOSCOPY (EGD) WITH PROPOFOL N/A 12/19/2016   Procedure: ESOPHAGOGASTRODUODENOSCOPY (EGD) WITH PROPOFOL;  Surgeon: Jonathon Bellows, MD;  Location: [facility] ENDOSCOPY;  Service: Gastroenterology;  Laterality: N/A;  . EUS N/A 09/24/2015   Procedure: UPPER ENDOSCOPIC ULTRASOUND (EUS) LINEAR;  Surgeon: Jola Schmidt, MD;  Location: [facility] ENDOSCOPY;   Service: Endoscopy;  Laterality: N/A;  . LOWER EXTREMITY ANGIOGRAPHY Right 06/09/2016   Procedure: Lower Extremity Angiography;  Surgeon: Algernon Huxley, MD;  Location: [place] [facility];  Service: Cardiovascular;  Laterality: Right;  . PERIPHERAL VASCULAR CATHETERIZATION N/A 02/23/2015   Procedure: A/V Shuntogram/Fistulagram;  Surgeon: Algernon Huxley, MD;  Location: [place] [facility];  Service: Cardiovascular;  Laterality: N/A;  . PERIPHERAL VASCULAR CATHETERIZATION N/A 02/23/2015   Procedure: A/V Shunt Intervention;  Surgeon: Algernon Huxley, MD;  Location: [place] [facility];  Service: Cardiovascular;  Laterality: N/A;  . VISCERAL ANGIOGRAPHY N/A 04/04/2017   Procedure: VISCERAL ANGIOGRAPHY;  Surgeon: Katha Cabal, MD;  Location: [place] [facility];  Service: Cardiovascular;  Laterality: N/A;    Social History Social History   Tobacco Use  . Smoking status: Former Smoker    Packs/day: 0.25    Years: 30.00    Pack years: 7.50    Types: Cigarettes    Last attempt to quit: 11/20/2013    Years since quitting: 4.4  . Smokeless tobacco: Never Used  Substance Use Topics  . Alcohol use: No    Alcohol/week: 0.0 standard drinks  . Drug use: No    Family History Family History  Problem Relation Age of Onset  . CAD Father   . Dementia Mother   . Lupus Sister     No Known Allergies   REVIEW OF SYSTEMS (Negative unless checked)  Constitutional: [] Weight loss  [] Fever  [] Chills Cardiac: [] Chest pain   [] Chest pressure   [] Palpitations   [] Shortness of breath when laying flat   [] Shortness of breath with exertion. Vascular:  [] Pain in legs with walking   [] Pain in legs at rest  [] History of DVT   [] Phlebitis   [] Swelling in legs   [] Varicose veins   [] Non-healing ulcers Pulmonary:   [] Uses home oxygen   [] Productive cough   [] Hemoptysis   [] Wheeze  [] COPD   [] Asthma Neurologic:  [] Dizziness   [] Seizures   [] History of stroke   [] History of TIA  [] Aphasia   [] Vissual changes    [] Weakness or numbness in arm   [x] Weakness or numbness in leg Musculoskeletal:   [] Joint swelling   [] Joint pain   [] Low back pain Hematologic:  [] Easy bruising  [] Easy bleeding   [] Hypercoagulable state   [] Anemic Gastrointestinal:  [] Diarrhea   [] Vomiting  [] Gastroesophageal reflux/heartburn   [] Difficulty swallowing. Genitourinary:  [x] Chronic kidney disease   [] Difficult urination  [] Frequent urination   [] Blood in urine Skin:  [] Rashes   [] Ulcers  Psychological:  [] History of anxiety   []  History of major depression.  Physical Examination  There were no vitals filed for this visit. There is no height or weight on file to calculate BMI. Gen: WD/WN, NAD; seen in whellchair Head: [place]/AT, No temporalis wasting.  Ear/Nose/Throat: Hearing grossly intact, nares w/o erythema or drainage Eyes: PER, EOMI, sclera nonicteric.  Neck: Supple, no large masses.   Pulmonary:  Good air movement, no audible wheezing bilaterally, no use of accessory muscles.  Cardiac: RRR, no JVD Vascular: left arm brachial axillary graft good bruit moderately pulsatile Vessel Right Left  Radial Palpable Palpable  Brachial Palpable Palpable  Carotid Palpable Palpable  Gastrointestinal: Non-distended. No guarding/no peritoneal signs.  Musculoskeletal: M/S 5/5  arms 3/5 both legs.  No deformity or atrophy.  Neurologic: CN 2-12 intact. Symmetrical.  Speech is fluent. Motor exam as listed above. Psychiatric: Judgment intact, Mood & affect appropriate for pt's clinical situation. Dermatologic: No rashes or ulcers noted.  No changes consistent with cellulitis. Lymph : No lichenification or skin changes of chronic lymphedema.  CBC Lab Results  Component Value Date   WBC 5.9 01/20/2017   HGB 8.7 (L) 01/21/2017   HCT 26.1 (L) 01/20/2017   MCV 89.5 01/20/2017   PLT 161 01/20/2017    BMET    Component Value Date/Time   NA 137 01/20/2017 0833   NA 137 11/26/2013 0650   K 4.2 01/20/2017 0833   K 5.2 (H) 11/26/2013  0650   CL 95 (L) 01/20/2017 0833   CL 102 11/26/2013 0650   CO2 29 01/20/2017 0833   CO2 26 11/26/2013 0650   GLUCOSE 82 01/20/2017 0833   GLUCOSE 87 11/26/2013 0650   BUN 40 (H) 01/20/2017 0833   BUN 52 (H) 11/26/2013 0650   CREATININE 6.15 (H) 01/20/2017 0833   CREATININE 9.20 (H) 11/26/2013 0650   CALCIUM 8.2 (L) 01/20/2017 0833   CALCIUM 8.8 11/26/2013 0650   GFRNONAA 6 (L) 01/20/2017 0833   GFRNONAA 4 (L) 11/26/2013 0650   GFRAA 7 (L) 01/20/2017 0833   GFRAA 5 (L) 11/26/2013 0650   CrCl cannot be calculated (Patient's most recent lab result is older than the maximum 21 days allowed.).  COAG Lab Results  Component Value Date   INR 1.25 12/17/2016   INR 1.11 11/16/2016   INR 0.98 05/12/2016    Radiology No results found.   Assessment/Plan 1. Complication from renal dialysis device, sequela Recommend:  The patient is experiencing increasing problems with their dialysis access.  Patient should have a fistulagram with the intention for intervention.  The intention for intervention is to restore appropriate flow and prevent thrombosis and possible loss of the access.  As well as improve the quality of dialysis therapy.  The risks, benefits and alternative therapies were reviewed in detail with the patient.  All questions were answered.  The patient agrees to proceed with angio/intervention.      2. Atherosclerotic peripheral vascular disease ([place])  Recommend:  The patient has evidence of atherosclerosis of the lower extremities with claudication.  The patient does not voice lifestyle limiting changes at this point in time.  Noninvasive studies do not suggest clinically significant change.  No invasive studies, angiography or surgery at this time The patient should continue walking and begin a more formal exercise program.  The patient should continue antiplatelet therapy and aggressive treatment of the lipid abnormalities  No changes in the patient's medications  at this time  The patient should continue wearing graduated compression socks 10-15 mmHg strength to control the mild edema.    3. End stage renal disease on dialysis ([place]) Continue HD without interruption   4. Paroxysmal atrial fibrillation (HCC) Continue antiarrhythmia medications as already ordered, these medications have been reviewed and there are no changes at this time.  Continue anticoagulation as ordered by Cardiology Service   5. Essential hypertension Continue antihypertensive medications as already ordered, these medications have been reviewed and there are no changes at this time.     Hortencia Pilar, MD  04/26/2018 9:11 AM

## 2018-04-27 ENCOUNTER — Telehealth (INDEPENDENT_AMBULATORY_CARE_PROVIDER_SITE_OTHER): Payer: Self-pay

## 2018-04-27 ENCOUNTER — Encounter (INDEPENDENT_AMBULATORY_CARE_PROVIDER_SITE_OTHER): Payer: Self-pay

## 2018-04-27 NOTE — Telephone Encounter (Signed)
Spoke with Joelene Millin at [facility] to schedule the patient for her fistulagram with Dr. Delana Meyer on 05/15/18 with a 11:30 arrival time. Pre-procedure instructions will be faxed to [facility] as well.

## 2018-05-11 ENCOUNTER — Other Ambulatory Visit (INDEPENDENT_AMBULATORY_CARE_PROVIDER_SITE_OTHER): Payer: Self-pay | Admitting: Nurse Practitioner

## 2018-05-14 MED ORDER — CEFAZOLIN SODIUM-DEXTROSE 1-4 GM/50ML-% IV SOLN
1.0000 g | Freq: Once | INTRAVENOUS | Status: AC
  Administered 2018-05-15: 1 g via INTRAVENOUS

## 2018-05-15 ENCOUNTER — Encounter
Admission: RE | Disposition: A | Discharge status: Home / Self Care | Payer: Self-pay | Source: Home / Self Care | Attending: Vascular Surgery

## 2018-05-15 ENCOUNTER — Other Ambulatory Visit: Payer: Self-pay

## 2018-05-15 ENCOUNTER — Ambulatory Visit
Admission: RE | Admit: 2018-05-15 | Discharge: 2018-05-15 | Disposition: A | Discharge status: Home / Self Care | Payer: Medicare Other | Source: Home / Self Care | Attending: Vascular Surgery | Admitting: Vascular Surgery

## 2018-05-15 DIAGNOSIS — I480 Paroxysmal atrial fibrillation: Secondary | ICD-10-CM | POA: Insufficient documentation

## 2018-05-15 DIAGNOSIS — Z992 Dependence on renal dialysis: Secondary | ICD-10-CM | POA: Insufficient documentation

## 2018-05-15 DIAGNOSIS — Z955 Presence of coronary angioplasty implant and graft: Secondary | ICD-10-CM

## 2018-05-15 DIAGNOSIS — I482 Chronic atrial fibrillation, unspecified: Secondary | ICD-10-CM | POA: Diagnosis not present

## 2018-05-15 DIAGNOSIS — N2581 Secondary hyperparathyroidism of renal origin: Secondary | ICD-10-CM

## 2018-05-15 DIAGNOSIS — Z8249 Family history of ischemic heart disease and other diseases of the circulatory system: Secondary | ICD-10-CM

## 2018-05-15 DIAGNOSIS — I5032 Chronic diastolic (congestive) heart failure: Secondary | ICD-10-CM | POA: Insufficient documentation

## 2018-05-15 DIAGNOSIS — I5033 Acute on chronic diastolic (congestive) heart failure: Secondary | ICD-10-CM | POA: Diagnosis not present

## 2018-05-15 DIAGNOSIS — F329 Major depressive disorder, single episode, unspecified: Secondary | ICD-10-CM | POA: Diagnosis not present

## 2018-05-15 DIAGNOSIS — Z87891 Personal history of nicotine dependence: Secondary | ICD-10-CM

## 2018-05-15 DIAGNOSIS — R4781 Slurred speech: Secondary | ICD-10-CM | POA: Diagnosis not present

## 2018-05-15 DIAGNOSIS — Y832 Surgical operation with anastomosis, bypass or graft as the cause of abnormal reaction of the patient, or of later complication, without mention of misadventure at the time of the procedure: Secondary | ICD-10-CM

## 2018-05-15 DIAGNOSIS — N186 End stage renal disease: Secondary | ICD-10-CM

## 2018-05-15 DIAGNOSIS — Z7901 Long term (current) use of anticoagulants: Secondary | ICD-10-CM | POA: Diagnosis not present

## 2018-05-15 DIAGNOSIS — Z832 Family history of diseases of the blood and blood-forming organs and certain disorders involving the immune mechanism: Secondary | ICD-10-CM | POA: Diagnosis not present

## 2018-05-15 DIAGNOSIS — I2510 Atherosclerotic heart disease of native coronary artery without angina pectoris: Secondary | ICD-10-CM | POA: Diagnosis not present

## 2018-05-15 DIAGNOSIS — K639 Disease of intestine, unspecified: Secondary | ICD-10-CM | POA: Diagnosis not present

## 2018-05-15 DIAGNOSIS — R0602 Shortness of breath: Secondary | ICD-10-CM | POA: Diagnosis present

## 2018-05-15 DIAGNOSIS — I272 Pulmonary hypertension, unspecified: Secondary | ICD-10-CM

## 2018-05-15 DIAGNOSIS — Z7902 Long term (current) use of antithrombotics/antiplatelets: Secondary | ICD-10-CM | POA: Diagnosis not present

## 2018-05-15 DIAGNOSIS — I6523 Occlusion and stenosis of bilateral carotid arteries: Secondary | ICD-10-CM

## 2018-05-15 DIAGNOSIS — I4891 Unspecified atrial fibrillation: Secondary | ICD-10-CM | POA: Diagnosis not present

## 2018-05-15 DIAGNOSIS — T82858A Stenosis of vascular prosthetic devices, implants and grafts, initial encounter: Secondary | ICD-10-CM | POA: Insufficient documentation

## 2018-05-15 DIAGNOSIS — I70213 Atherosclerosis of native arteries of extremities with intermittent claudication, bilateral legs: Secondary | ICD-10-CM | POA: Insufficient documentation

## 2018-05-15 DIAGNOSIS — D649 Anemia, unspecified: Secondary | ICD-10-CM | POA: Diagnosis not present

## 2018-05-15 DIAGNOSIS — Z8673 Personal history of transient ischemic attack (TIA), and cerebral infarction without residual deficits: Secondary | ICD-10-CM | POA: Diagnosis not present

## 2018-05-15 DIAGNOSIS — D631 Anemia in chronic kidney disease: Secondary | ICD-10-CM | POA: Diagnosis not present

## 2018-05-15 DIAGNOSIS — I6529 Occlusion and stenosis of unspecified carotid artery: Secondary | ICD-10-CM | POA: Diagnosis not present

## 2018-05-15 DIAGNOSIS — E785 Hyperlipidemia, unspecified: Secondary | ICD-10-CM | POA: Diagnosis not present

## 2018-05-15 DIAGNOSIS — R609 Edema, unspecified: Secondary | ICD-10-CM | POA: Diagnosis not present

## 2018-05-15 DIAGNOSIS — I132 Hypertensive heart and chronic kidney disease with heart failure and with stage 5 chronic kidney disease, or end stage renal disease: Secondary | ICD-10-CM | POA: Insufficient documentation

## 2018-05-15 DIAGNOSIS — J9601 Acute respiratory failure with hypoxia: Secondary | ICD-10-CM | POA: Diagnosis not present

## 2018-05-15 HISTORY — PX: A/V FISTULAGRAM: CATH118298

## 2018-05-15 LAB — POTASSIUM (ARMC VASCULAR LAB ONLY): Potassium (ARMC vascular lab): 4.1 (ref 3.5–5.1)

## 2018-05-15 SURGERY — A/V FISTULAGRAM
Anesthesia: Moderate Sedation | Laterality: Left

## 2018-05-15 MED ORDER — HYDROMORPHONE HCL 1 MG/ML IJ SOLN: 1.0000 mg | Freq: Once | INTRAMUSCULAR | Status: DC | PRN

## 2018-05-15 MED ORDER — CEFAZOLIN SODIUM-DEXTROSE 1-4 GM/50ML-% IV SOLN
INTRAVENOUS | Status: AC
  Administered 2018-05-15: 1 g via INTRAVENOUS
  Filled 2018-05-15: qty 50

## 2018-05-15 MED ORDER — FENTANYL CITRATE (PF) 100 MCG/2ML IJ SOLN
INTRAMUSCULAR | Status: AC
  Filled 2018-05-15: qty 2

## 2018-05-15 MED ORDER — IOPAMIDOL (ISOVUE-300) INJECTION 61%
INTRAVENOUS | Status: DC | PRN
  Administered 2018-05-15: 45 mL via INTRAVENOUS

## 2018-05-15 MED ORDER — FENTANYL CITRATE (PF) 100 MCG/2ML IJ SOLN
INTRAMUSCULAR | Status: DC | PRN
  Administered 2018-05-15: 50 ug via INTRAVENOUS

## 2018-05-15 MED ORDER — ONDANSETRON HCL 4 MG/2ML IJ SOLN: 4.0000 mg | Freq: Four times a day (QID) | INTRAMUSCULAR | Status: DC | PRN

## 2018-05-15 MED ORDER — MIDAZOLAM HCL 5 MG/5ML IJ SOLN
INTRAMUSCULAR | Status: AC
  Filled 2018-05-15: qty 5

## 2018-05-15 MED ORDER — SODIUM CHLORIDE 0.9 % IV SOLN: INTRAVENOUS | Status: DC

## 2018-05-15 MED ORDER — HEPARIN SODIUM (PORCINE) 1000 UNIT/ML IJ SOLN
INTRAMUSCULAR | Status: AC
  Filled 2018-05-15: qty 1

## 2018-05-15 MED ORDER — HEPARIN SODIUM (PORCINE) 1000 UNIT/ML IJ SOLN
INTRAMUSCULAR | Status: DC | PRN
  Administered 2018-05-15: 3000 [IU] via INTRAVENOUS

## 2018-05-15 MED ORDER — MIDAZOLAM HCL 2 MG/2ML IJ SOLN
INTRAMUSCULAR | Status: DC | PRN
  Administered 2018-05-15: 2 mg via INTRAVENOUS

## 2018-05-15 SURGICAL SUPPLY — 17 items
BALLN DORADO 10X40X80 (BALLOONS) ×2
BALLN DORADO 8X60X80 (BALLOONS) ×2
BALLN LUTONIX AV 12X40X75 (BALLOONS) ×2
BALLOON DORADO 10X40X80 (BALLOONS) ×1 IMPLANT
BALLOON DORADO 8X60X80 (BALLOONS) ×1 IMPLANT
BALLOON LUTONIX AV 12X40X75 (BALLOONS) ×1 IMPLANT
CATH BEACON 5 .035 40 KMP TP (CATHETERS) ×1 IMPLANT
CATH BEACON 5 .038 40 KMP TP (CATHETERS) ×1
DEVICE PRESTO INFLATION (MISCELLANEOUS) ×2 IMPLANT
DRAPE BRACHIAL (DRAPES) ×2 IMPLANT
NEEDLE ENTRY 21GA 7CM ECHOTIP (NEEDLE) ×2 IMPLANT
PACK ANGIOGRAPHY (CUSTOM PROCEDURE TRAY) ×2 IMPLANT
SET INTRO CAPELLA COAXIAL (SET/KITS/TRAYS/PACK) ×2 IMPLANT
SHEATH BRITE TIP 6FRX5.5 (SHEATH) ×2 IMPLANT
SHEATH BRITE TIP 7FRX5.5 (SHEATH) ×2 IMPLANT
SUT MNCRL AB 4-0 PS2 18 (SUTURE) ×2 IMPLANT
WIRE MAGIC TORQUE 260C (WIRE) ×2 IMPLANT

## 2018-05-15 NOTE — Op Note (Signed)
OPERATIVE NOTE   PROCEDURE: 1. Contrast injection left brachiocephalic AV access 2. Percutaneous transluminal angioplasty peripheral segment to 8 mm 3. Percutaneous transluminal angioplasty central venous segment to 12 mm  PRE-OPERATIVE DIAGNOSIS: Complication of dialysis access                                                       End Stage Renal Disease  POST-OPERATIVE DIAGNOSIS: same as above   SURGEON: Katha Cabal, M.D.  ANESTHESIA: Conscious sedation was administered under my direct supervision by the interventional radiology RN. IV Versed plus fentanyl were utilized. Continuous ECG, pulse oximetry and blood pressure was monitored throughout the entire procedure.  Conscious sedation was for a total of 36 minutes.  ESTIMATED BLOOD LOSS: minimal  FINDING(S): Stricture of the AV graft within the peripheral segment as well as a stricture within the central venous portion  SPECIMEN(S):  None  CONTRAST: 45 cc  FLUOROSCOPY TIME: 2.6 minutes  INDICATIONS: Alejandrina Raimer Radler is a 72 y.o. female who  presents with malfunctioning left arm AV access.  The patient is scheduled for angiography with possible intervention of the AV access.  The patient is aware the risks include but are not limited to: bleeding, infection, thrombosis of the cannulated access, and possible anaphylactic reaction to the contrast.  The patient acknowledges if the access can not be salvaged a tunneled catheter will be needed and will be placed during this procedure.  The patient is aware of the risks of the procedure and elects to proceed with the angiogram and intervention.  DESCRIPTION: After full informed written consent was obtained, the patient was brought back to the Special Procedure suite and placed supine position.  Appropriate cardiopulmonary monitors were placed.  The left arm was prepped and draped in the standard fashion.  Appropriate timeout is called. The left brachiocephalic fistula was cannulated  with a micropuncture needle.  Cannulation was performed with ultrasound guidance. Ultrasound was placed in a sterile sleeve, the AV access was interrogated and noted to be echolucent and compressible indicating patency. Image was recorded for the permanent record. The puncture is performed under continuous ultrasound visualization.   The microwire was advanced and the needle was exchanged for  a microsheath.  The J-wire was then advanced and a 6 Fr sheath inserted.  Hand injections were completed to image the access from the arterial anastomosis through the entire access.  The central venous structures were also imaged by hand injections.  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  There is some narrowing just above the arterial anastomosis but this does not appear to be hemodynamically significant.  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  There is moderate stenosis at the subclavian cephalic confluence where a previously placed via bond stent is located.  Moderate stenosis being greater than 60%.  There is a greater than 80% stenosis of the innominate vein at the confluence of the jugular and subclavian veins.  The more proximal innominate vein and the superior vena cava are widely patent.  Based on the images,  3000 units of heparin was given and a wire was negotiated through the strictures within the venous  portion of the fistula as well as the central stenosis. The sheath was then upsized to a 7 [place] sheath.  A 10 mm x 60 mm Dorado balloon was used.  Inflation was to 24 atm for 1 minute.  Next a 12 mm x 40 mm Lutonix drug-eluting balloon was advanced across the innominate stenosis.  Inflation was to 14 atm for 1 minute.  Follow-up imaging demonstrated less than 15% residual stenosis.  The detector  was then repositioned over the peripheral portion of the AV access and 8 mm x 60 mm Dorado balloon was used to treat the stricture within the AV access at 2 locations. Inflations were to 20 atm for 1 minutes.  Follow-up imaging demonstrates significant improvement in the peripheral lesions with less than 15% residual stenosis.  There is now rapid flow of contrast through the graft and the central veins.   A 4-0 Monocryl purse-string suture was sewn around the sheath.  The sheath was removed and light pressure was applied.  A sterile bandage was applied to the puncture site.    COMPLICATIONS: None  CONDITION: Carlynn Purl, M.D [facility] Office: [phone]  05/15/2018 1:04 PM

## 2018-05-15 NOTE — H&P (Signed)
[facility] History & Physical Update  The patient was interviewed and re-examined.  The patient's previous History and Physical has been reviewed and is unchanged.  There is no change in the plan of care. We plan to proceed with the scheduled procedure.  Hortencia Pilar, MD  05/15/2018, 12:59 PM

## 2018-05-16 ENCOUNTER — Emergency Department: Payer: Medicare Other

## 2018-05-16 ENCOUNTER — Inpatient Hospital Stay
Admission: EM | Admit: 2018-05-16 | Discharge: 2018-05-17 | DRG: 987 | Disposition: A | Discharge status: Home / Self Care | Payer: Medicare Other | Attending: Internal Medicine | Admitting: Internal Medicine

## 2018-05-16 ENCOUNTER — Encounter: Payer: Self-pay | Admitting: Emergency Medicine

## 2018-05-16 ENCOUNTER — Other Ambulatory Visit: Payer: Self-pay

## 2018-05-16 DIAGNOSIS — N2581 Secondary hyperparathyroidism of renal origin: Secondary | ICD-10-CM | POA: Diagnosis not present

## 2018-05-16 DIAGNOSIS — Z79899 Other long term (current) drug therapy: Secondary | ICD-10-CM

## 2018-05-16 DIAGNOSIS — Z8249 Family history of ischemic heart disease and other diseases of the circulatory system: Secondary | ICD-10-CM

## 2018-05-16 DIAGNOSIS — R05 Cough: Secondary | ICD-10-CM

## 2018-05-16 DIAGNOSIS — Z832 Family history of diseases of the blood and blood-forming organs and certain disorders involving the immune mechanism: Secondary | ICD-10-CM

## 2018-05-16 DIAGNOSIS — I5033 Acute on chronic diastolic (congestive) heart failure: Secondary | ICD-10-CM | POA: Diagnosis not present

## 2018-05-16 DIAGNOSIS — Z87891 Personal history of nicotine dependence: Secondary | ICD-10-CM | POA: Diagnosis not present

## 2018-05-16 DIAGNOSIS — I2510 Atherosclerotic heart disease of native coronary artery without angina pectoris: Secondary | ICD-10-CM | POA: Diagnosis not present

## 2018-05-16 DIAGNOSIS — K639 Disease of intestine, unspecified: Secondary | ICD-10-CM | POA: Diagnosis present

## 2018-05-16 DIAGNOSIS — R609 Edema, unspecified: Secondary | ICD-10-CM | POA: Diagnosis not present

## 2018-05-16 DIAGNOSIS — D649 Anemia, unspecified: Secondary | ICD-10-CM | POA: Diagnosis present

## 2018-05-16 DIAGNOSIS — J9601 Acute respiratory failure with hypoxia: Principal | ICD-10-CM | POA: Diagnosis present

## 2018-05-16 DIAGNOSIS — D631 Anemia in chronic kidney disease: Secondary | ICD-10-CM | POA: Diagnosis not present

## 2018-05-16 DIAGNOSIS — E785 Hyperlipidemia, unspecified: Secondary | ICD-10-CM | POA: Diagnosis not present

## 2018-05-16 DIAGNOSIS — F329 Major depressive disorder, single episode, unspecified: Secondary | ICD-10-CM | POA: Diagnosis not present

## 2018-05-16 DIAGNOSIS — R059 Cough, unspecified: Secondary | ICD-10-CM

## 2018-05-16 DIAGNOSIS — Z992 Dependence on renal dialysis: Secondary | ICD-10-CM

## 2018-05-16 DIAGNOSIS — R4781 Slurred speech: Secondary | ICD-10-CM | POA: Diagnosis present

## 2018-05-16 DIAGNOSIS — N186 End stage renal disease: Secondary | ICD-10-CM | POA: Diagnosis not present

## 2018-05-16 DIAGNOSIS — I482 Chronic atrial fibrillation, unspecified: Secondary | ICD-10-CM | POA: Diagnosis present

## 2018-05-16 DIAGNOSIS — I132 Hypertensive heart and chronic kidney disease with heart failure and with stage 5 chronic kidney disease, or end stage renal disease: Secondary | ICD-10-CM | POA: Diagnosis present

## 2018-05-16 DIAGNOSIS — J810 Acute pulmonary edema: Secondary | ICD-10-CM

## 2018-05-16 DIAGNOSIS — I4891 Unspecified atrial fibrillation: Secondary | ICD-10-CM | POA: Diagnosis present

## 2018-05-16 DIAGNOSIS — I6529 Occlusion and stenosis of unspecified carotid artery: Secondary | ICD-10-CM | POA: Diagnosis not present

## 2018-05-16 DIAGNOSIS — Z7902 Long term (current) use of antithrombotics/antiplatelets: Secondary | ICD-10-CM

## 2018-05-16 DIAGNOSIS — Z8673 Personal history of transient ischemic attack (TIA), and cerebral infarction without residual deficits: Secondary | ICD-10-CM | POA: Diagnosis not present

## 2018-05-16 DIAGNOSIS — I272 Pulmonary hypertension, unspecified: Secondary | ICD-10-CM | POA: Diagnosis not present

## 2018-05-16 DIAGNOSIS — R0602 Shortness of breath: Secondary | ICD-10-CM

## 2018-05-16 DIAGNOSIS — Z7901 Long term (current) use of anticoagulants: Secondary | ICD-10-CM

## 2018-05-16 LAB — COMPREHENSIVE METABOLIC PANEL
ALT: 11 U/L (ref 0–44)
AST: 20 U/L (ref 15–41)
Albumin: 3.6 g/dL (ref 3.5–5.0)
Alkaline Phosphatase: 91 U/L (ref 38–126)
Anion gap: 12 (ref 5–15)
BUN: 34 mg/dL — AB (ref 8–23)
CO2: 27 mmol/L (ref 22–32)
Calcium: 8.6 mg/dL — ABNORMAL LOW (ref 8.9–10.3)
Chloride: 101 mmol/L (ref 98–111)
Creatinine, Ser: 8.63 mg/dL — ABNORMAL HIGH (ref 0.44–1.00)
GFR calc Af Amer: 5 mL/min — ABNORMAL LOW (ref >60)
GFR calc non Af Amer: 4 mL/min — ABNORMAL LOW (ref >60)
Glucose, Bld: 86 mg/dL (ref 70–99)
Potassium: 4.2 mmol/L (ref 3.5–5.1)
Sodium: 140 mmol/L (ref 135–145)
Total Bilirubin: 0.7 mg/dL (ref 0.3–1.2)
Total Protein: 7.3 g/dL (ref 6.5–8.1)

## 2018-05-16 LAB — CBC WITH DIFFERENTIAL/PLATELET
Abs Immature Granulocytes: 0.02 10*3/uL (ref 0.00–0.07)
Basophils Absolute: 0 10*3/uL (ref 0.0–0.1)
Basophils Relative: 1 %
Eosinophils Absolute: 0.2 10*3/uL (ref 0.0–0.5)
Eosinophils Relative: 3 %
HCT: 31.5 % — ABNORMAL LOW (ref 36.0–46.0)
HEMOGLOBIN: 9 g/dL — AB (ref 12.0–15.0)
IMMATURE GRANULOCYTES: 0 %
LYMPHS PCT: 15 %
Lymphs Abs: 0.8 10*3/uL (ref 0.7–4.0)
MCH: 28.8 pg (ref 26.0–34.0)
MCHC: 28.6 g/dL — ABNORMAL LOW (ref 30.0–36.0)
MCV: 100.6 fL — ABNORMAL HIGH (ref 80.0–100.0)
Monocytes Absolute: 0.5 10*3/uL (ref 0.1–1.0)
Monocytes Relative: 10 %
Neutro Abs: 3.8 10*3/uL (ref 1.7–7.7)
Neutrophils Relative %: 71 %
Platelets: 137 10*3/uL — ABNORMAL LOW (ref 150–400)
RBC: 3.13 MIL/uL — ABNORMAL LOW (ref 3.87–5.11)
RDW: 16 % — ABNORMAL HIGH (ref 11.5–15.5)
WBC: 5.3 10*3/uL (ref 4.0–10.5)
nRBC: 0 % (ref 0.0–0.2)

## 2018-05-16 LAB — PROTIME-INR
INR: 1.05
Prothrombin Time: 13.6 seconds (ref 11.4–15.2)

## 2018-05-16 LAB — LIPASE, BLOOD: Lipase: 21 U/L (ref 11–51)

## 2018-05-16 LAB — MRSA PCR SCREENING: MRSA by PCR: POSITIVE — AB

## 2018-05-16 MED ORDER — POLYETHYLENE GLYCOL 3350 17 G PO PACK: 17.0000 g | PACK | Freq: Every day | ORAL | Status: DC | PRN

## 2018-05-16 MED ORDER — PANTOPRAZOLE SODIUM 40 MG PO TBEC
40.0000 mg | DELAYED_RELEASE_TABLET | Freq: Two times a day (BID) | ORAL | Status: DC
  Administered 2018-05-16 – 2018-05-17 (×3): 40 mg via ORAL
  Filled 2018-05-16 (×3): qty 1

## 2018-05-16 MED ORDER — CLOPIDOGREL BISULFATE 75 MG PO TABS
75.0000 mg | ORAL_TABLET | Freq: Every day | ORAL | Status: DC
  Administered 2018-05-16 – 2018-05-17 (×2): 75 mg via ORAL
  Filled 2018-05-16 (×2): qty 1

## 2018-05-16 MED ORDER — SODIUM CHLORIDE 0.9% FLUSH
3.0000 mL | Freq: Two times a day (BID) | INTRAVENOUS | Status: DC
  Administered 2018-05-16 – 2018-05-17 (×2): 3 mL via INTRAVENOUS

## 2018-05-16 MED ORDER — EPOETIN ALFA 10000 UNIT/ML IJ SOLN
10000.0000 [IU] | INTRAMUSCULAR | Status: DC
  Administered 2018-05-16: 10000 [IU] via INTRAVENOUS
  Filled 2018-05-16: qty 1

## 2018-05-16 MED ORDER — ACETAMINOPHEN 325 MG PO TABS: 650.0000 mg | ORAL_TABLET | Freq: Four times a day (QID) | ORAL | Status: DC | PRN

## 2018-05-16 MED ORDER — HYDROCODONE-ACETAMINOPHEN 5-325 MG PO TABS: 1.0000 | ORAL_TABLET | ORAL | Status: DC | PRN

## 2018-05-16 MED ORDER — SODIUM CHLORIDE 0.9 % IV SOLN: 250.0000 mL | INTRAVENOUS | Status: DC | PRN

## 2018-05-16 MED ORDER — MAGNESIUM HYDROXIDE 400 MG/5ML PO SUSP
30.0000 mL | Freq: Every evening | ORAL | Status: DC | PRN
  Filled 2018-05-16: qty 30

## 2018-05-16 MED ORDER — LOPERAMIDE HCL 2 MG PO CAPS: 2.0000 mg | ORAL_CAPSULE | ORAL | Status: DC | PRN

## 2018-05-16 MED ORDER — TRAZODONE HCL 50 MG PO TABS
25.0000 mg | ORAL_TABLET | Freq: Every day | ORAL | Status: DC
  Administered 2018-05-16: 25 mg via ORAL
  Filled 2018-05-16: qty 1

## 2018-05-16 MED ORDER — CALCIUM ACETATE (PHOS BINDER) 667 MG PO CAPS
1334.0000 mg | ORAL_CAPSULE | Freq: Three times a day (TID) | ORAL | Status: DC
  Administered 2018-05-16 – 2018-05-17 (×3): 1334 mg via ORAL
  Filled 2018-05-16 (×5): qty 2

## 2018-05-16 MED ORDER — GUAIFENESIN 100 MG/5ML PO SYRP
200.0000 mg | ORAL_SOLUTION | Freq: Four times a day (QID) | ORAL | Status: DC | PRN
  Administered 2018-05-17: 11:00:00 200 mg via ORAL
  Filled 2018-05-16 (×3): qty 10

## 2018-05-16 MED ORDER — ALBUTEROL SULFATE (2.5 MG/3ML) 0.083% IN NEBU: 2.5000 mg | INHALATION_SOLUTION | RESPIRATORY_TRACT | Status: DC | PRN

## 2018-05-16 MED ORDER — TRIPLE ANTIBIOTIC 3.5-400-5000 EX OINT
1.0000 "application " | TOPICAL_OINTMENT | Freq: Two times a day (BID) | CUTANEOUS | Status: DC | PRN
  Filled 2018-05-16: qty 1

## 2018-05-16 MED ORDER — ACETAMINOPHEN 650 MG RE SUPP: 650.0000 mg | Freq: Four times a day (QID) | RECTAL | Status: DC | PRN

## 2018-05-16 MED ORDER — WARFARIN - PHARMACIST DOSING INPATIENT: Freq: Every day | Status: DC

## 2018-05-16 MED ORDER — GABAPENTIN 300 MG PO CAPS
300.0000 mg | ORAL_CAPSULE | Freq: Every day | ORAL | Status: DC
  Administered 2018-05-16: 300 mg via ORAL
  Filled 2018-05-16: qty 1

## 2018-05-16 MED ORDER — BISACODYL 5 MG PO TBEC: 5.0000 mg | DELAYED_RELEASE_TABLET | Freq: Every day | ORAL | Status: DC | PRN

## 2018-05-16 MED ORDER — ONDANSETRON HCL 4 MG/2ML IJ SOLN: 4.0000 mg | Freq: Four times a day (QID) | INTRAMUSCULAR | Status: DC | PRN

## 2018-05-16 MED ORDER — LORATADINE 10 MG PO TABS
10.0000 mg | ORAL_TABLET | Freq: Every day | ORAL | Status: DC
  Administered 2018-05-16 – 2018-05-17 (×2): 10 mg via ORAL
  Filled 2018-05-16 (×2): qty 1

## 2018-05-16 MED ORDER — ALUM & MAG HYDROXIDE-SIMETH 200-200-20 MG/5ML PO SUSP: 30.0000 mL | Freq: Four times a day (QID) | ORAL | Status: DC | PRN

## 2018-05-16 MED ORDER — CARVEDILOL 3.125 MG PO TABS
6.2500 mg | ORAL_TABLET | Freq: Two times a day (BID) | ORAL | Status: DC
  Filled 2018-05-16 (×2): qty 2

## 2018-05-16 MED ORDER — SENNOSIDES-DOCUSATE SODIUM 8.6-50 MG PO TABS: 1.0000 | ORAL_TABLET | Freq: Every evening | ORAL | Status: DC | PRN

## 2018-05-16 MED ORDER — CALCIUM CARBONATE ANTACID 500 MG PO CHEW: 1.0000 | CHEWABLE_TABLET | Freq: Three times a day (TID) | ORAL | Status: DC | PRN

## 2018-05-16 MED ORDER — SODIUM CHLORIDE 0.9% FLUSH: 3.0000 mL | INTRAVENOUS | Status: DC | PRN

## 2018-05-16 MED ORDER — WARFARIN SODIUM 7.5 MG PO TABS
7.5000 mg | ORAL_TABLET | Freq: Once | ORAL | Status: AC
  Administered 2018-05-16: 17:00:00 7.5 mg via ORAL
  Filled 2018-05-16: qty 1

## 2018-05-16 MED ORDER — ESCITALOPRAM OXALATE 10 MG PO TABS
10.0000 mg | ORAL_TABLET | Freq: Every day | ORAL | Status: DC
  Administered 2018-05-16: 14:00:00 10 mg via ORAL
  Filled 2018-05-16 (×3): qty 1

## 2018-05-16 MED ORDER — SPIRONOLACTONE 25 MG PO TABS
25.0000 mg | ORAL_TABLET | Freq: Every day | ORAL | Status: DC
  Administered 2018-05-16 – 2018-05-17 (×2): 25 mg via ORAL
  Filled 2018-05-16 (×2): qty 1

## 2018-05-16 MED ORDER — ATORVASTATIN CALCIUM 20 MG PO TABS
40.0000 mg | ORAL_TABLET | Freq: Every day | ORAL | Status: DC
  Administered 2018-05-16: 23:00:00 40 mg via ORAL
  Filled 2018-05-16: qty 2

## 2018-05-16 MED ORDER — ONDANSETRON HCL 4 MG PO TABS: 4.0000 mg | ORAL_TABLET | Freq: Four times a day (QID) | ORAL | Status: DC | PRN

## 2018-05-16 NOTE — ED Provider Notes (Signed)
[facility] Emergency Department Provider Note   ____________________________________________    I have reviewed the triage vital signs and the nursing notes.   HISTORY  Chief Complaint Emesis     HPI Toni Carlson is a 73 y.o. female with history of atrial fibrillation on Coumadin, history of stage renal disease, coronary artery disease, CHF who presents with complaints of cough.  Patient reports cough started over the last 2 days but is worsened last night, she coughed so hard that she has vomited.  She denies abdominal pain.  Denies chest pain.  Yesterday had angioplasty of her AV fistula.  No fevers reported.  No chills.  No diaphoresis calf pain or swelling.   Past Medical History:  Diagnosis Date  . A-fib (HCC)    a. on warfarin  . Anemia   . Carotid artery stenosis    a. ultrasound 03/2015: RICA 20-35% stenosis, LICA less than 59% stenosis  . Chronic diastolic CHF (congestive heart failure) ([place])    a. echo 03/2015: EF 60-65%, normal wall motion, diastolic parameters were c/w restrictive physiology, indicative of decreased LV diastolic compliance and/or increased LA pressure, mild AS, mild MR, left atrium was severely dilated, RA was severely dilated, PASP was severely increased at 90 mm Hg  . Coronary artery disease, non-occlusive    a. cardiac cath 2013  . ESRD (end stage renal disease) ([place])    On Tue-Thur-Sat dialysis  . H/O ischemic right MCA stroke    a. 03/2015; b. residual left-sided facial droop and slurred speech  . Hyperparathyroidism, secondary renal ([place])   . Hypertension   . Nicotine dependence   . Pulmonary HTN ([place])    as above  . Symptomatic bradycardia    a. history of symptomatic bradycardia; b. felt to be 2/2 metabolic abnormalities & required temp wire but no PPM, resolved with HD    Patient Active Problem List   Diagnosis Date Noted  . Complication of AV dialysis fistula 04/17/2017  . 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 Hyperlipidemia 03/16/2017  . Complication from renal dialysis device 11/27/2016  . Gastric polyp 08/18/2015  . Anemia 07/15/2015  . GI bleed 07/15/2015  . Polyneuropathy 07/08/2015  . Degenerative disc disease, lumbar 07/08/2015  . Pancytopenia ([place]) 07/08/2015  . Generalized weakness 07/08/2015  . End stage renal disease on dialysis ([place]) 07/08/2015  . Atherosclerotic peripheral vascular disease ([place]) 07/08/2015  . Pain in both feet 07/02/2015  . Gastric ulceration   . Acute GI bleeding   . GIB (gastrointestinal bleeding) 04/12/2015  . Left-sided weakness 04/01/2015  . Essential hypertension 04/01/2015  . Anemia of chronic disease 04/01/2015  . Elevated troponin 04/01/2015  . Pulmonary hypertension ([place]) 04/01/2015  . Atrial fibrillation ([place])   . CVA (cerebral infarction) 03/26/2015    Past Surgical History:  Procedure Laterality Date  . A/V FISTULAGRAM Left 10/25/2016   Procedure: A/V Fistulagram;  Surgeon: Katha Cabal, MD;  Location: [place] CV LAB;  Service: Cardiovascular;  Laterality: Left;  . A/V FISTULAGRAM Left 01/23/2018   Procedure: A/V FISTULAGRAM;  Surgeon: Katha Cabal, MD;  Location: [place] CV LAB;  Service: Cardiovascular;  Laterality: Left;  . COLONOSCOPY WITH PROPOFOL N/A 12/19/2016   Procedure: COLONOSCOPY WITH PROPOFOL;  Surgeon: Jonathon Bellows, MD;  Location: [facility] ENDOSCOPY;  Service: Gastroenterology;  Laterality: N/A;  . ESOPHAGOGASTRODUODENOSCOPY (EGD) WITH PROPOFOL N/A 04/14/2015   Procedure: ESOPHAGOGASTRODUODENOSCOPY (EGD) WITH PROPOFOL;  Surgeon: Lucilla Lame, MD;  Location: [facility] ENDOSCOPY;  Service: Endoscopy;  Laterality: N/A;  . ESOPHAGOGASTRODUODENOSCOPY (EGD) WITH PROPOFOL N/A 07/17/2015   Procedure: ESOPHAGOGASTRODUODENOSCOPY (EGD) WITH PROPOFOL;  Surgeon: Josefine Class, MD;  Location: [facility] ENDOSCOPY;  Service: Endoscopy;  Laterality: N/A;  . ESOPHAGOGASTRODUODENOSCOPY (EGD) WITH PROPOFOL N/A  12/19/2016   Procedure: ESOPHAGOGASTRODUODENOSCOPY (EGD) WITH PROPOFOL;  Surgeon: Jonathon Bellows, MD;  Location: [facility] ENDOSCOPY;  Service: Gastroenterology;  Laterality: N/A;  . EUS N/A 09/24/2015   Procedure: UPPER ENDOSCOPIC ULTRASOUND (EUS) LINEAR;  Surgeon: Jola Schmidt, MD;  Location: [facility] ENDOSCOPY;  Service: Endoscopy;  Laterality: N/A;  . LOWER EXTREMITY ANGIOGRAPHY Right 06/09/2016   Procedure: Lower Extremity Angiography;  Surgeon: Algernon Huxley, MD;  Location: [place] CV LAB;  Service: Cardiovascular;  Laterality: Right;  . PERIPHERAL VASCULAR CATHETERIZATION N/A 02/23/2015   Procedure: A/V Shuntogram/Fistulagram;  Surgeon: Algernon Huxley, MD;  Location: [place] [facility] LAB;  Service: Cardiovascular;  Laterality: N/A;  . PERIPHERAL VASCULAR CATHETERIZATION N/A 02/23/2015   Procedure: A/V Shunt Intervention;  Surgeon: Algernon Huxley, MD;  Location: [place] CV LAB;  Service: Cardiovascular;  Laterality: N/A;  . VISCERAL ANGIOGRAPHY N/A 04/04/2017   Procedure: VISCERAL ANGIOGRAPHY;  Surgeon: Katha Cabal, MD;  Location: [place] CV LAB;  Service: Cardiovascular;  Laterality: N/A;    Prior to Admission medications   Medication Sig Start Date End Date Taking? Authorizing Provider  acetaminophen (TYLENOL) 500 MG tablet Take 500 mg by mouth every 4 (four) hours as needed for mild pain, fever or headache.     [provider]  alum & mag hydroxide-simeth (MINTOX) 200-200-20 MG/5ML suspension Take 30 mLs by mouth 4 (four) times daily as needed for indigestion or heartburn.     [provider]  amLODipine (NORVASC) 10 MG tablet Take 1 tablet (10 mg total) by mouth daily. Patient taking differently: Take 10 mg by mouth every evening.  09/19/15   Carrie Mew, MD  atorvastatin (LIPITOR) 40 MG tablet Take 1 tablet (40 mg total) by mouth daily. Patient taking differently: Take 40 mg by mouth at bedtime.  09/19/15   Carrie Mew, MD  calcium acetate (PHOSLO) 667 MG  capsule Take 1,334 mg by mouth 3 (three) times daily.    [provider]  calcium carbonate (TUMS - DOSED IN MG ELEMENTAL CALCIUM) 500 MG chewable tablet Chew 1 tablet by mouth 3 (three) times daily as needed.     [provider]  carvedilol (COREG) 6.25 MG tablet Take 1 tablet (6.25 mg total) by mouth 2 (two) times daily with a meal. 09/19/15   Carrie Mew, MD  cetirizine (ZYRTEC) 10 MG tablet Take 10 mg by mouth daily as needed.     [provider]  clopidogrel (PLAVIX) 75 MG tablet Take 75 mg daily by mouth.    [provider]  escitalopram (LEXAPRO) 10 MG tablet Take 10 mg by mouth daily.    [provider]  gabapentin (NEURONTIN) 300 MG capsule Take 300 mg by mouth at bedtime.    [provider]  guaifenesin (ROBITUSSIN) 100 MG/5ML syrup Take 200 mg by mouth every 6 (six) hours as needed for cough.    [provider]  loperamide (IMODIUM) 2 MG capsule Take 2 mg by mouth as needed for diarrhea or loose stools.    [provider]  losartan (COZAAR) 100 MG tablet Take 1 tablet (100 mg total) by mouth daily. Patient taking differently: Take 100 mg by mouth every evening.  09/19/15   Carrie Mew, MD  magnesium  hydroxide (MILK OF MAGNESIA) 400 MG/5ML suspension Take 30 mLs by mouth at bedtime as needed for mild constipation.    [provider]  Neomycin-Bacitracin-Polymyxin (TRIPLE ANTIBIOTIC) [phone] OINT Apply 1 application topically 3 times/day as needed-between meals & bedtime (for wound care).     [provider]  ondansetron (ZOFRAN) 4 MG tablet Take 4 mg by mouth every 8 (eight) hours as needed for nausea or vomiting.    [provider]  pantoprazole (PROTONIX) 40 MG tablet Take 1 tablet (40 mg total) by mouth 2 (two) times daily. 09/19/15   Carrie Mew, MD  polyethylene glycol [facility] Floria Raveling) packet Take 17 g by mouth daily as needed for mild constipation. 09/19/15   Carrie Mew, MD  spironolactone (ALDACTONE) 25 MG tablet Take 1 tablet (25 mg total) by mouth daily. 12/20/16   Fritzi Mandes, MD  traZODone (DESYREL) 50 MG tablet Take 25 mg by mouth at bedtime.  05/18/16   [provider]  trolamine salicylate (ASPERCREME) 10 % cream Apply topically 2 (two) times daily as needed for muscle pain. 09/19/15   Carrie Mew, MD     Allergies Patient has no known allergies.  Family History  Problem Relation Age of Onset  . CAD Father   . Dementia Mother   . Lupus Sister     Social History Social History   Tobacco Use  . Smoking status: Former Smoker    Packs/day: 0.25    Years: 30.00    Pack years: 7.50    Types: Cigarettes    Last attempt to quit: 11/20/2013    Years since quitting: 4.4  . Smokeless tobacco: Never Used  Substance Use Topics  . Alcohol use: No    Alcohol/week: 0.0 standard drinks  . Drug use: No    Review of Systems  Constitutional: No fever/chills Eyes: No visual changes.  ENT: No sore throat. Cardiovascular: Denies chest pain. Respiratory: As above Gastrointestinal: As above Genitourinary: Negative for dysuria. Musculoskeletal: Negative for back pain. Skin: Negative for rash. Neurological: Negative for headaches    ____________________________________________   PHYSICAL EXAM:  VITAL SIGNS: ED Triage Vitals  Enc Vitals Group     BP 05/16/18 0648 (!) 100/48     Pulse Rate 05/16/18 0648 (!) 58     Resp 05/16/18 0648 18     Temp 05/16/18 0648 97.7 F (36.5 C)     Temp Source 05/16/18 0648 Oral     SpO2 05/16/18 0648 97 %     Weight 05/16/18 0652 70.8 kg (156 lb)     Height 05/16/18 0652 1.702 m (5\' 7" )     Head Circumference --      Peak Flow --      Pain Score 05/16/18 0652 0     Pain Loc --      Pain Edu? --      Excl. in [place]? --     Constitutional: Alert and oriented.  Eyes: Conjunctivae are normal.   Nose: No congestion/rhinnorhea. Mouth/Throat: Mucous membranes are moist.    Cardiovascular:  Normal rate, regular rhythm. Grossly normal heart sounds.  Good peripheral circulation. Respiratory: Normal respiratory effort.  No retractions, scattered mild wheezes Gastrointestinal: Soft and nontender. No distention.    Musculoskeletal: No lower extremity tenderness nor edema.  Warm and well perfused.  Left upper extremity warm and well-perfused 2+ distal pulses, no erythema. Neurologic:  Normal speech and language. No gross focal neurologic deficits are appreciated.  Skin:  Skin is warm,  dry and intact. No rash noted. Psychiatric: Mood and affect are normal. Speech and behavior are normal.  ____________________________________________   LABS (all labs ordered are listed, but only abnormal results are displayed)  Labs Reviewed  COMPREHENSIVE METABOLIC PANEL - Abnormal; Notable for the following components:      Result Value   BUN 34 (*)    Creatinine, Ser 8.63 (*)    Calcium 8.6 (*)    GFR calc non Af Amer 4 (*)    GFR calc Af Amer 5 (*)    All other components within normal limits  CBC WITH DIFFERENTIAL/PLATELET - Abnormal; Notable for the following components:   RBC 3.13 (*)    Hemoglobin 9.0 (*)    HCT 31.5 (*)    MCV 100.6 (*)    MCHC 28.6 (*)    RDW 16.0 (*)    Platelets 137 (*)    All other components within normal limits  LIPASE, BLOOD  URINALYSIS, COMPLETE (UACMP) WITH MICROSCOPIC   ____________________________________________  EKG  ED ECG REPORT I, Lavonia Drafts, the attending physician, personally viewed and interpreted this ECG.  Date: 05/16/2018  Rhythm: normal sinus rhythm QRS Axis: normal Intervals: normal ST/T Wave abnormalities: T wave abnormality lateral leads   ____________________________________________  RADIOLOGY  Chest x-ray ____________________________________________   PROCEDURES  Procedure(s) performed: No  Procedures   Critical Care performed: No ____________________________________________   INITIAL IMPRESSION /  ASSESSMENT AND PLAN / ED COURSE  Pertinent labs & imaging results that were available during my care of the patient were reviewed by me and considered in my medical decision making (see chart for details).  Patient presents with primary complaint of cough, she reports she has coughed so hard that she has vomited.  She denies abdominal pain.  Denies significant shortness of breath.  Is due for dialysis today.  AV fistula angioplasty performed yesterday  Chest x-ray demonstrates pulmonary edema, patient is requiring O2  Discussed with Dr. Juleen China of nephrology, he will arrange for dialysis  Discussed with Dr. Bridgett Larsson of hospitalist service he will admit the patient    ____________________________________________   FINAL CLINICAL IMPRESSION(S) / ED DIAGNOSES  Final diagnoses:  Acute pulmonary edema ([place])  ESRD (end stage renal disease) ([place])  Shortness of breath        Note:  This document was prepared using Dragon voice recognition software and may include unintentional dictation errors.   Lavonia Drafts, MD 05/16/18 [id number]

## 2018-05-16 NOTE — ED Notes (Signed)
ED TO INPATIENT HANDOFF REPORT  Name/Age/Gender Toni Carlson 73 y.o. female  Code Status Code Status History    Date Active Date Inactive Code Status Order ID Comments User Context   01/19/2017 2358 01/21/2017 1926 Full Code [id number]  Lance Coon, MD Inpatient   12/17/2016 0630 12/20/2016 0017 Full Code [id number]  Saundra Shelling, MD Inpatient   11/16/2016 2051 11/17/2016 1531 Full Code [id number]  Fritzi Mandes, MD Inpatient   06/09/2016 1839 06/10/2016 [place] Full Code [id number]  Algernon Huxley, MD Inpatient   07/15/2015 1310 07/17/2015 1630 Full Code [id number]  Hillary Bow, MD ED   07/02/2015 1723 07/08/2015 2009 Full Code [id number]  Gladstone Lighter, MD Inpatient   04/12/2015 1303 04/15/2015 1800 DNR [id number]  Bettey Costa, MD Inpatient   03/26/2015 1605 04/01/2015 2024 Full Code [id number]  Gladstone Lighter, MD Inpatient   02/23/2015 0939 02/23/2015 1341 Full Code [id number]  Algernon Huxley, MD Inpatient      Home/SNF/Other [place]  Chief Complaint [facility] - Emesis  Level of Care/Admitting Diagnosis ED Disposition    ED Disposition Condition [place]: [place] [100120]  Level of Care: Med-Surg [16]  Diagnosis: Acute respiratory failure with hypoxia [facility]) [[id number]]  Admitting Physician: Demetrios Loll [[id number]]  Attending Physician: Demetrios Loll [phone]  Estimated length of stay: past midnight tomorrow  Certification:: I certify this patient will need inpatient services for at least 2 midnights  PT Class (Do Not Modify): Inpatient [101]  PT Acc Code (Do Not Modify): Private [1]       Medical History Past Medical History:  Diagnosis Date  . A-fib (HCC)    a. on warfarin  . Anemia   . Carotid artery stenosis    a. ultrasound 03/2015: RICA 22-63% stenosis, LICA less than 33% stenosis  . Chronic diastolic CHF (congestive heart failure) ([place])    a. echo 03/2015: EF 60-65%, normal wall motion, diastolic  parameters were c/w restrictive physiology, indicative of decreased LV diastolic compliance and/or increased LA pressure, mild AS, mild MR, left atrium was severely dilated, RA was severely dilated, PASP was severely increased at 90 mm Hg  . Coronary artery disease, non-occlusive    a. cardiac cath 2013  . ESRD (end stage renal disease) ([place])    On Tue-Thur-Sat dialysis  . H/O ischemic right MCA stroke    a. 03/2015; b. residual left-sided facial droop and slurred speech  . Hyperparathyroidism, secondary renal ([place])   . Hypertension   . Nicotine dependence   . Pulmonary HTN ([place])    as above  . Symptomatic bradycardia    a. history of symptomatic bradycardia; b. felt to be 2/2 metabolic abnormalities & required temp wire but no PPM, resolved with HD    Allergies No Known Allergies  IV Location/Drains/Wounds Patient Lines/Drains/Airways Status   Active Line/Drains/Airways    Name:   Placement date:   Placement time:   Site:   Days:   Peripheral IV 05/16/18 Right Wrist   05/16/18    0647    Wrist   less than 1   Fistula / Graft Left Upper arm   -    -    Upper arm      Fistula / Graft Left Upper arm Arteriovenous fistula   -    -    Upper arm      Fistula / Graft Left Upper arm Arteriovenous fistula   07/02/15  1739    Upper arm   1049   Fistula / Graft Left Upper arm   -    -    Upper arm      Fistula / Graft Left Upper arm Arteriovenous vein graft   12/16/16    0600    Upper arm   516   Sheath 05/15/18 Left Venous   05/15/18    1233    Venous   1   Airway   12/19/16    0923     513          Labs/Imaging Results for orders placed or performed during the hospital encounter of 05/16/18 (from the past 48 hour(s))  Comprehensive metabolic panel     Status: Abnormal   Collection Time: 05/16/18  6:56 AM  Result Value Ref Range   Sodium 140 135 - 145 mmol/L   Potassium 4.2 3.5 - 5.1 mmol/L   Chloride 101 98 - 111 mmol/L   CO2 27 22 - 32 mmol/L   Glucose, Bld 86 70 - 99 mg/dL    BUN 34 (H) 8 - 23 mg/dL   Creatinine, Ser 8.63 (H) 0.44 - 1.00 mg/dL   Calcium 8.6 (L) 8.9 - 10.3 mg/dL   Total Protein 7.3 6.5 - 8.1 g/dL   Albumin 3.6 3.5 - 5.0 g/dL   AST 20 15 - 41 U/L   ALT 11 0 - 44 U/L   Alkaline Phosphatase 91 38 - 126 U/L   Total Bilirubin 0.7 0.3 - 1.2 mg/dL   GFR calc non Af Amer 4 (L) >60 mL/min   GFR calc Af Amer 5 (L) >60 mL/min   Anion gap 12 5 - 15    Comment: Performed at [facility], [address] [place], [place] [address]  Lipase, blood     Status: None   Collection Time: 05/16/18  6:56 AM  Result Value Ref Range   Lipase 21 11 - 51 U/L    Comment: Performed at [facility], [address] [place], [place] [address]  CBC with Differential     Status: Abnormal   Collection Time: 05/16/18  6:56 AM  Result Value Ref Range   WBC 5.3 4.0 - 10.5 K/uL   RBC 3.13 (L) 3.87 - 5.11 MIL/uL   Hemoglobin 9.0 (L) 12.0 - 15.0 g/dL   HCT 31.5 (L) 36.0 - 46.0 %   MCV 100.6 (H) 80.0 - 100.0 fL   MCH 28.8 26.0 - 34.0 pg   MCHC 28.6 (L) 30.0 - 36.0 g/dL   RDW 16.0 (H) 11.5 - 15.5 %   Platelets 137 (L) 150 - 400 K/uL   nRBC 0.0 0.0 - 0.2 %   Neutrophils Relative % 71 %   Neutro Abs 3.8 1.7 - 7.7 K/uL   Lymphocytes Relative 15 %   Lymphs Abs 0.8 0.7 - 4.0 K/uL   Monocytes Relative 10 %   Monocytes Absolute 0.5 0.1 - 1.0 K/uL   Eosinophils Relative 3 %   Eosinophils Absolute 0.2 0.0 - 0.5 K/uL   Basophils Relative 1 %   Basophils Absolute 0.0 0.0 - 0.1 K/uL   Immature Granulocytes 0 %   Abs Immature Granulocytes 0.02 0.00 - 0.07 K/uL    Comment: Performed at [facility], [address], [place], [place] [address]   Dg Chest Port 1 View  Result Date: 05/16/2018 CLINICAL DATA:  Patient with nausea and vomiting. EXAM: PORTABLE CHEST 1 VIEW COMPARISON:  Chest  radiograph 12/17/2016 FINDINGS: Monitoring leads overlie the patient. Stable cardiomegaly. Aortic atherosclerosis. Coarse bilateral interstitial pulmonary opacities. No  large area pulmonary consolidation. No pleural effusion or pneumothorax. IMPRESSION: Cardiomegaly. Mild interstitial opacities favored to represent edema. Electronically Signed   By: Lovey Newcomer M.D.   On: 05/16/2018 07:45    Pending Labs Unresulted Labs (From admission, onward)    Start     Ordered   05/16/18 0843  Protime-INR  ONCE - STAT,   STAT     05/16/18 0842   05/16/18 0646  Urinalysis, Complete w Microscopic  ONCE - STAT,   STAT     05/16/18 0645   Signed and Held  HIV antibody (Routine Testing)  Once,   R     Signed and Held   Signed and Held  Basic metabolic panel  Tomorrow morning,   R     Signed and Held   Signed and Held  CBC  Tomorrow morning,   R     Signed and Held   Signed and Held  Basic metabolic panel  Daily,   R     Signed and Held          Vitals/Pain Today's Vitals   05/16/18 0800 05/16/18 0801 05/16/18 0802 05/16/18 0830  BP:    (!) 121/54  Pulse:    (!) 55  Resp:      Temp:      TempSrc:      SpO2: (!) 86% (!) 89% 97% 97%  Weight:      Height:      PainSc:        Isolation Precautions No active isolations  Medications Medications - No data to display  Mobility walks with device - walker

## 2018-05-16 NOTE — Progress Notes (Signed)
HD completed without issue. Patient tolerated well. UF 1.5L as ordered. Sats remain stable on nasal cannula. Down to 3L currently, sats 97%. Report given to primary RN.

## 2018-05-16 NOTE — ED Notes (Signed)
Pt transported to 105.

## 2018-05-16 NOTE — Progress Notes (Signed)
[facility]  ROUNDING NOTE   Subjective:   [name] admitted to [facility] on 05/16/2018 for Shortness of breath [R06.02] Cough [R05] Acute pulmonary edema ([place]) [J81.0] ESRD (end stage renal disease) ([place]) [N18.6]  Placed on hemodialysis. Seen and examined on hemodialysis treatment.     HEMODIALYSIS FLOWSHEET:  Blood Flow Rate (mL/min): 400 mL/min Arterial Pressure (mmHg): -160 mmHg Venous Pressure (mmHg): 190 mmHg Transmembrane Pressure (mmHg): 50 mmHg Ultrafiltration Rate (mL/min): 670 mL/min Dialysate Flow Rate (mL/min): 600 ml/min Conductivity: Machine : 14 Conductivity: Machine : 14 Dialysis Fluid Bolus: Normal Saline Bolus Amount (mL): 250 mL Dialysate Change: (3k 2.5ca)    Objective:  Vital signs in last 24 hours:  Temp:  [97.4 F (36.3 C)-97.7 F (36.5 C)] 97.5 F (36.4 C) (01/01 1253) Pulse Rate:  [35-67] 67 (01/01 1305) Resp:  [12-23] 12 (01/01 1305) BP: (100-150)/(41-68) 108/65 (01/01 1305) SpO2:  [86 %-100 %] 99 % (01/01 1305) Weight:  [70.8 kg-74.8 kg] 72.1 kg (01/01 1253)  Weight change:  Filed Weights   05/16/18 0652 05/16/18 0950 05/16/18 1253  Weight: 70.8 kg 74.8 kg 72.1 kg    Intake/Output: No intake/output data recorded.   Intake/Output this shift:  Total I/O In: -  Out: 1500 [Other:1500]  Physical Exam: General: NAD,   Head: Normocephalic, atraumatic. Moist oral mucosal membranes  Eyes: Anicteric, PERRL  Neck: Supple, trachea midline  Lungs:  Clear to auscultation  Heart: Regular rate and rhythm  Abdomen:  Soft, nontender,   Extremities: no peripheral edema.  Neurologic: Nonfocal, moving all four extremities  Skin: No lesions  Access: Left AVF    Basic Metabolic Panel: Recent Labs  Lab 05/16/18 0656  NA 140  K 4.2  CL 101  CO2 27  GLUCOSE 86  BUN 34*  CREATININE 8.63*  CALCIUM 8.6*    Liver Function Tests: Recent Labs  Lab 05/16/18 0656  AST 20  ALT 11  ALKPHOS 91  BILITOT 0.7  PROT 7.3   ALBUMIN 3.6   Recent Labs  Lab 05/16/18 0656  LIPASE 21   No results for input(s): AMMONIA in the last 168 hours.  CBC: Recent Labs  Lab 05/16/18 0656  WBC 5.3  NEUTROABS 3.8  HGB 9.0*  HCT 31.5*  MCV 100.6*  PLT 137*    Cardiac Enzymes: No results for input(s): CKTOTAL, CKMB, CKMBINDEX, TROPONINI in the last 168 hours.  BNP: Invalid input(s): POCBNP  CBG: No results for input(s): GLUCAP in the last 168 hours.  Microbiology: Results for orders placed or performed during the hospital encounter of 01/19/17  MRSA PCR Screening     Status: None   Collection Time: 01/20/17 12:36 AM  Result Value Ref Range Status   MRSA by PCR NEGATIVE NEGATIVE Final    Comment:        The GeneXpert MRSA Assay (FDA approved for NASAL specimens only), is one component of a comprehensive MRSA colonization surveillance program. It is not intended to diagnose MRSA infection nor to guide or monitor treatment for MRSA infections.     Coagulation Studies: Recent Labs    05/16/18 0917  LABPROT 13.6  INR 1.05    Urinalysis: No results for input(s): COLORURINE, LABSPEC, PHURINE, GLUCOSEU, HGBUR, BILIRUBINUR, KETONESUR, PROTEINUR, UROBILINOGEN, NITRITE, LEUKOCYTESUR in the last 72 hours.  Invalid input(s): APPERANCEUR    Imaging: Dg Chest Port 1 View  Result Date: 05/16/2018 CLINICAL DATA:  Patient with nausea and vomiting. EXAM: PORTABLE CHEST 1 VIEW COMPARISON:  Chest radiograph 12/17/2016  FINDINGS: Monitoring leads overlie the patient. Stable cardiomegaly. Aortic atherosclerosis. Coarse bilateral interstitial pulmonary opacities. No large area pulmonary consolidation. No pleural effusion or pneumothorax. IMPRESSION: Cardiomegaly. Mild interstitial opacities favored to represent edema. Electronically Signed   By: Lovey Newcomer M.D.   On: 05/16/2018 07:45     Medications:   . sodium chloride     . atorvastatin  40 mg Oral QHS  . calcium acetate  1,334 mg Oral TID AC  .  carvedilol  6.25 mg Oral BID WC  . clopidogrel  75 mg Oral Daily  . epoetin (EPOGEN/PROCRIT) injection  10,000 Units Intravenous Q M,W,F-HD  . escitalopram  10 mg Oral Daily  . gabapentin  300 mg Oral QHS  . loratadine  10 mg Oral Daily  . pantoprazole  40 mg Oral BID  . sodium chloride flush  3 mL Intravenous Q12H  . spironolactone  25 mg Oral Daily  . traZODone  25 mg Oral QHS  . warfarin  7.5 mg Oral ONCE-1800  . Warfarin - Pharmacist Dosing Inpatient   Does not apply q1800   sodium chloride, acetaminophen **OR** acetaminophen, albuterol, alum & mag hydroxide-simeth, bisacodyl, calcium carbonate, guaifenesin, HYDROcodone-acetaminophen, loperamide, magnesium hydroxide, ondansetron **OR** ondansetron (ZOFRAN) IV, polyethylene glycol, senna-docusate, sodium chloride flush, TRIPLE ANTIBIOTIC  Assessment/ Plan:  [name] is a 73 y.o. black female with end stage renal disease on hemodialysis, hypertension, coronary artery disease, CVA, pulmonary hypertension, carotid artery stenosis, colon mass, atrial fibrillation, depression, hyperlipidemia,   [facility] [address] Left AVF 74.5kg.   1. End stage renal disease: seen and examined on hemodialysis treatment. UF goal of 1.5 liters.   2. Anemia of chronic kidney disease: hemoglobin 9. Macrocytic - EPO with HD treatment.   3. Secondary Hyperparathyroidism: PTH 533 outpatient.  - calcium acetate with meals.   4. Hypertension: blood pressure is at goal - carvedilol.    LOS: 0 Clarise Chacko 1/1/20201:20 PM

## 2018-05-16 NOTE — Progress Notes (Signed)
Patient to dialysis. Dr. Bridgett Larsson notified of tele.

## 2018-05-16 NOTE — ED Triage Notes (Signed)
Pt arrived to the ED via EMS from [facility] for complaints of nausea and vomiting for the past 2 days. Pt has a cough with congestion and had a shunt for dialysis placed on her left arm yesterday. Pt is AOx4 in no apparent distress.

## 2018-05-16 NOTE — Progress Notes (Signed)
Advanced Care Plan.  Purpose of Encounter: CODE STATUS. Parties in Attendance: The patient and me. Patient's Decisional Capacity: Yes. Medical Story: Toni Carlson  is a 73 y.o. female with a known history of A. Fib, anemia, carotid artery stenosis, chronic diastolic CHF ejection fraction 60 to 65%, ESRD, hemodialysis, CAD, hypertension, pulmonary hypertension, etc. she is admitted for acute respiratory failure with hypoxia due to pulmonary edema secondary to fluid overload and CHF.  I discussed with the patient about her current condition, prognosis and CODE STATUS.  The patient wants to be resuscitated and intubated if she has cardiopulmonary arrest. Plan:  Code Status: Full code. Time spent discussing advance care planning: 18 minutes.

## 2018-05-16 NOTE — Consult Note (Signed)
ANTICOAGULATION CONSULT NOTE  Pharmacy Consult for warfarin dosing Indication: atrial fibrillation  No Known Allergies  Patient Measurements: Height: 5\' 7"  (170.2 cm) Weight: 164 lb 14.4 oz (74.8 kg) IBW/kg (Calculated) : 61.6  Vital Signs: Temp: 97.6 F (36.4 C) (01/01 0950) Temp Source: Oral (01/01 0950) BP: 117/49 (01/01 1030) Pulse Rate: 58 (01/01 1030)  Labs: Recent Labs    05/16/18 0656 05/16/18 0917  HGB 9.0*  --   HCT 31.5*  --   PLT 137*  --   LABPROT  --  13.6  INR  --  1.05  CREATININE 8.63*  --     Estimated Creatinine Clearance: 6.4 mL/min (A) (by C-G formula based on SCr of 8.63 mg/dL (H)).   Medical History: Past Medical History:  Diagnosis Date  . A-fib (HCC)    a. on warfarin  . Anemia   . Carotid artery stenosis    a. ultrasound 03/2015: RICA 36-46% stenosis, LICA less than 80% stenosis  . Chronic diastolic CHF (congestive heart failure) ([place])    a. echo 03/2015: EF 60-65%, normal wall motion, diastolic parameters were c/w restrictive physiology, indicative of decreased LV diastolic compliance and/or increased LA pressure, mild AS, mild MR, left atrium was severely dilated, RA was severely dilated, PASP was severely increased at 90 mm Hg  . Coronary artery disease, non-occlusive    a. cardiac cath 2013  . ESRD (end stage renal disease) ([place])    On Tue-Thur-Sat dialysis  . H/O ischemic right MCA stroke    a. 03/2015; b. residual left-sided facial droop and slurred speech  . Hyperparathyroidism, secondary renal ([place])   . Hypertension   . Nicotine dependence   . Pulmonary HTN ([employer])    as above  . Symptomatic bradycardia    a. history of symptomatic bradycardia; b. felt to be 2/2 metabolic abnormalities & required temp wire but no PPM, resolved with HD   Assessment: 73 y.o. female with a history of A. Fib. She is noted to be on warfarin PTA. However, she comes from [facility] where warfarin does not show up even in the discontinued  medication section. Additionally, her baseline INR is approximately 1 second. In the past she was on 7.5mg  daily but there are no INR records to follow. There are no significant DDIs to note except clopidogrel.  Goal of Therapy:  INR 2-3 Monitor platelets by anticoagulation protocol: Yes   Plan: Based on the [facility] algorithm for warfarin initiation she has 5 points, which indicates a first dose of 7.5mg . Pharmacy will follow INR for additional warfarin doses.  Dallie Piles, PharmD 05/16/2018,10:37 AM

## 2018-05-16 NOTE — H&P (Signed)
[place] [facility] [place] NAME: Toni Carlson    MR#:  [id number]  DATE OF BIRTH:  12/05/1945  DATE OF ADMISSION:  05/16/2018  PRIMARY CARE PHYSICIAN: [facility]   REQUESTING/REFERRING PHYSICIAN: Dr. Corky Downs.  CHIEF COMPLAINT:   Chief Complaint  Patient presents with  . Emesis   Worsening shortness of breath for 2 days. HISTORY OF PRESENT ILLNESS:  Toni Carlson  is a 73 y.o. female with a known history of A. Fib, anemia, carotid artery stenosis, chronic diastolic CHF ejection fraction 60 to 65%, ESRD, hemodialysis, CAD, hypertension, pulmonary hypertension, etc. the patient presented to ED with above chief complaint.  The patient has had worsening shortness of breath and a cough for the past 2 days.  She denies any fever or chills, no chest pain no palpitation or leg edema.  She says she feels thirsty and drink a lot of fluid.  She has completion of dialysis access and got procedure yesterday.  She is found hypoxia and put on oxygen 4 L the ED.  Chest x-ray show pulmonary edema. PAST MEDICAL HISTORY:   Past Medical History:  Diagnosis Date  . A-fib (HCC)    a. on warfarin  . Anemia   . Carotid artery stenosis    a. ultrasound 03/2015: RICA 57-01% stenosis, LICA less than 77% stenosis  . Chronic diastolic CHF (congestive heart failure) ([place])    a. echo 03/2015: EF 60-65%, normal wall motion, diastolic parameters were c/w restrictive physiology, indicative of decreased LV diastolic compliance and/or increased LA pressure, mild AS, mild MR, left atrium was severely dilated, RA was severely dilated, PASP was severely increased at 90 mm Hg  . Coronary artery disease, non-occlusive    a. cardiac cath 2013  . ESRD (end stage renal disease) ([place])    On Tue-Thur-Sat dialysis  . H/O ischemic right MCA stroke    a. 03/2015; b. residual left-sided facial droop and slurred speech  . Hyperparathyroidism, secondary renal ([place])   . Hypertension     . Nicotine dependence   . Pulmonary HTN ([place])    as above  . Symptomatic bradycardia    a. history of symptomatic bradycardia; b. felt to be 2/2 metabolic abnormalities & required temp wire but no PPM, resolved with HD    PAST SURGICAL HISTORY:   Past Surgical History:  Procedure Laterality Date  . A/V FISTULAGRAM Left 10/25/2016   Procedure: A/V Fistulagram;  Surgeon: Katha Cabal, MD;  Location: [place] CV LAB;  Service: Cardiovascular;  Laterality: Left;  . A/V FISTULAGRAM Left 01/23/2018   Procedure: A/V FISTULAGRAM;  Surgeon: Katha Cabal, MD;  Location: [place] [facility];  Service: Cardiovascular;  Laterality: Left;  . COLONOSCOPY WITH PROPOFOL N/A 12/19/2016   Procedure: COLONOSCOPY WITH PROPOFOL;  Surgeon: Jonathon Bellows, MD;  Location: [facility] ENDOSCOPY;  Service: Gastroenterology;  Laterality: N/A;  . ESOPHAGOGASTRODUODENOSCOPY (EGD) WITH PROPOFOL N/A 04/14/2015   Procedure: ESOPHAGOGASTRODUODENOSCOPY (EGD) WITH PROPOFOL;  Surgeon: Lucilla Lame, MD;  Location: [facility] ENDOSCOPY;  Service: Endoscopy;  Laterality: N/A;  . ESOPHAGOGASTRODUODENOSCOPY (EGD) WITH PROPOFOL N/A 07/17/2015   Procedure: ESOPHAGOGASTRODUODENOSCOPY (EGD) WITH PROPOFOL;  Surgeon: Josefine Class, MD;  Location: [facility] ENDOSCOPY;  Service: Endoscopy;  Laterality: N/A;  . ESOPHAGOGASTRODUODENOSCOPY (EGD) WITH PROPOFOL N/A 12/19/2016   Procedure: ESOPHAGOGASTRODUODENOSCOPY (EGD) WITH PROPOFOL;  Surgeon: Jonathon Bellows, MD;  Location: [facility] ENDOSCOPY;  Service: Gastroenterology;  Laterality: N/A;  . EUS N/A 09/24/2015   Procedure: UPPER ENDOSCOPIC ULTRASOUND (EUS)  LINEAR;  Surgeon: Jola Schmidt, MD;  Location: [facility] ENDOSCOPY;  Service: Endoscopy;  Laterality: N/A;  . LOWER EXTREMITY ANGIOGRAPHY Right 06/09/2016   Procedure: Lower Extremity Angiography;  Surgeon: Algernon Huxley, MD;  Location: [place] [facility] LAB;  Service: Cardiovascular;  Laterality: Right;  . PERIPHERAL VASCULAR CATHETERIZATION N/A 02/23/2015    Procedure: A/V Shuntogram/Fistulagram;  Surgeon: Algernon Huxley, MD;  Location: [place] [facility];  Service: Cardiovascular;  Laterality: N/A;  . PERIPHERAL VASCULAR CATHETERIZATION N/A 02/23/2015   Procedure: A/V Shunt Intervention;  Surgeon: Algernon Huxley, MD;  Location: [place] [facility] LAB;  Service: Cardiovascular;  Laterality: N/A;  . VISCERAL ANGIOGRAPHY N/A 04/04/2017   Procedure: VISCERAL ANGIOGRAPHY;  Surgeon: Katha Cabal, MD;  Location: [place] [facility] LAB;  Service: Cardiovascular;  Laterality: N/A;    SOCIAL HISTORY:   Social History   Tobacco Use  . Smoking status: Former Smoker    Packs/day: 0.25    Years: 30.00    Pack years: 7.50    Types: Cigarettes    Last attempt to quit: 11/20/2013    Years since quitting: 4.4  . Smokeless tobacco: Never Used  Substance Use Topics  . Alcohol use: No    Alcohol/week: 0.0 standard drinks    FAMILY HISTORY:   Family History  Problem Relation Age of Onset  . CAD Father   . Dementia Mother   . Lupus Sister     DRUG ALLERGIES:  No Known Allergies  REVIEW OF SYSTEMS:   Review of Systems  Constitutional: Positive for malaise/fatigue. Negative for chills and fever.  HENT: Negative for sore throat.   Eyes: Negative for blurred vision and double vision.  Respiratory: Positive for cough, sputum production and shortness of breath. Negative for hemoptysis, wheezing and stridor.   Cardiovascular: Negative for chest pain, palpitations, orthopnea and leg swelling.  Gastrointestinal: Negative for abdominal pain, blood in stool, diarrhea, melena, nausea and vomiting.  Genitourinary: Negative for dysuria, flank pain and hematuria.  Musculoskeletal: Negative for back pain and joint pain.  Neurological: Negative for dizziness, sensory change, focal weakness, seizures, loss of consciousness, weakness and headaches.  Endo/Heme/Allergies: Negative for polydipsia.  Psychiatric/Behavioral: Negative for depression. The patient is not  nervous/anxious.     MEDICATIONS AT HOME:   Prior to Admission medications   Medication Sig Start Date End Date Taking? Authorizing Provider  acetaminophen (TYLENOL) 500 MG tablet Take 500 mg by mouth every 4 (four) hours as needed for mild pain, fever or headache.     [provider]  alum & mag hydroxide-simeth (MINTOX) 200-200-20 MG/5ML suspension Take 30 mLs by mouth 4 (four) times daily as needed for indigestion or heartburn.     [provider]  amLODipine (NORVASC) 10 MG tablet Take 1 tablet (10 mg total) by mouth daily. Patient taking differently: Take 10 mg by mouth every evening.  09/19/15   Carrie Mew, MD  atorvastatin (LIPITOR) 40 MG tablet Take 1 tablet (40 mg total) by mouth daily. Patient taking differently: Take 40 mg by mouth at bedtime.  09/19/15   Carrie Mew, MD  calcium acetate (PHOSLO) 667 MG capsule Take 1,334 mg by mouth 3 (three) times daily.    [provider]  calcium carbonate (TUMS - DOSED IN MG ELEMENTAL CALCIUM) 500 MG chewable tablet Chew 1 tablet by mouth 3 (three) times daily as needed.     [provider]  carvedilol (COREG) 6.25 MG tablet Take 1 tablet (6.25 mg total) by mouth 2 (  two) times daily with a meal. 09/19/15   Carrie Mew, MD  cetirizine (ZYRTEC) 10 MG tablet Take 10 mg by mouth daily as needed.     [provider]  clopidogrel (PLAVIX) 75 MG tablet Take 75 mg daily by mouth.    [provider]  escitalopram (LEXAPRO) 10 MG tablet Take 10 mg by mouth daily.    [provider]  gabapentin (NEURONTIN) 300 MG capsule Take 300 mg by mouth at bedtime.    [provider]  guaifenesin (ROBITUSSIN) 100 MG/5ML syrup Take 200 mg by mouth every 6 (six) hours as needed for cough.    [provider]  loperamide (IMODIUM) 2 MG capsule Take 2 mg by mouth as needed for diarrhea or loose stools.    [provider]  losartan (COZAAR) 100 MG tablet Take 1 tablet (100  mg total) by mouth daily. Patient taking differently: Take 100 mg by mouth every evening.  09/19/15   Carrie Mew, MD  magnesium hydroxide (MILK OF MAGNESIA) 400 MG/5ML suspension Take 30 mLs by mouth at bedtime as needed for mild constipation.    [provider]  Neomycin-Bacitracin-Polymyxin (TRIPLE ANTIBIOTIC) [phone] OINT Apply 1 application topically 3 times/day as needed-between meals & bedtime (for wound care).     [provider]  ondansetron (ZOFRAN) 4 MG tablet Take 4 mg by mouth every 8 (eight) hours as needed for nausea or vomiting.    [provider]  pantoprazole (PROTONIX) 40 MG tablet Take 1 tablet (40 mg total) by mouth 2 (two) times daily. 09/19/15   Carrie Mew, MD  polyethylene glycol [facility]) packet Take 17 g by mouth daily as needed for mild constipation. 09/19/15   Carrie Mew, MD  spironolactone (ALDACTONE) 25 MG tablet Take 1 tablet (25 mg total) by mouth daily. 12/20/16   Fritzi Mandes, MD  traZODone (DESYREL) 50 MG tablet Take 25 mg by mouth at bedtime.  05/18/16   [provider]  trolamine salicylate (ASPERCREME) 10 % cream Apply topically 2 (two) times daily as needed for muscle pain. 09/19/15   Carrie Mew, MD      VITAL SIGNS:  Blood pressure (!) 103/43, pulse (!) 55, temperature 97.7 F (36.5 C), temperature source Oral, resp. rate 14, height 5\' 7"  (1.702 m), weight 70.8 kg, SpO2 97 %.  PHYSICAL EXAMINATION:  Physical Exam  GENERAL:  73 y.o.-year-old patient lying in the bed with no acute distress.  EYES: Pupils equal, round, reactive to light and accommodation. No scleral icterus. Extraocular muscles intact.  HEENT: Head atraumatic, normocephalic. Oropharynx and nasopharynx clear.  NECK:  Supple, no jugular venous distention. No thyroid enlargement, no tenderness.  LUNGS: Normal breath sounds bilaterally, no wheezing, rales,rhonchi or crepitation. No use of accessory muscles of respiration.    CARDIOVASCULAR: S1, S2 normal. No murmurs, rubs, or gallops.  ABDOMEN: Soft, nontender, nondistended. Bowel sounds present. No organomegaly or mass.  EXTREMITIES: No pedal edema, cyanosis, or clubbing.  NEUROLOGIC: Cranial nerves II through XII are intact. Muscle strength 5/5 in all extremities. Sensation intact. Gait not checked.  PSYCHIATRIC: The patient is alert and oriented x 3.  SKIN: No obvious rash, lesion, or ulcer.   LABORATORY PANEL:   CBC Recent Labs  Lab 05/16/18 0656  WBC 5.3  HGB 9.0*  HCT 31.5*  PLT 137*   ------------------------------------------------------------------------------------------------------------------  Chemistries  Recent Labs  Lab 05/16/18 0656  NA 140  K 4.2  CL 101  CO2 27  GLUCOSE 86  BUN 34*  CREATININE 8.63*  CALCIUM 8.6*  AST 20  ALT 11  ALKPHOS 91  BILITOT 0.7   ------------------------------------------------------------------------------------------------------------------  Cardiac Enzymes No results for input(s): TROPONINI in the last 168 hours. ------------------------------------------------------------------------------------------------------------------  RADIOLOGY:  Dg Chest Port 1 View  Result Date: 05/16/2018 CLINICAL DATA:  Patient with nausea and vomiting. EXAM: PORTABLE CHEST 1 VIEW COMPARISON:  Chest radiograph 12/17/2016 FINDINGS: Monitoring leads overlie the patient. Stable cardiomegaly. Aortic atherosclerosis. Coarse bilateral interstitial pulmonary opacities. No large area pulmonary consolidation. No pleural effusion or pneumothorax. IMPRESSION: Cardiomegaly. Mild interstitial opacities favored to represent edema. Electronically Signed   By: Lovey Newcomer M.D.   On: 05/16/2018 07:45      IMPRESSION AND PLAN:   Acute respiratory failure with hypoxia due to pulmonary edema secondary to fluid overload The patient will be admitted to medical floor. Continue oxygen by nasal cannula, DuoNeb as  needed. Nephrology consult for urgent hemodialysis.  Acute on chronic diastolic CHF. Treatment as above.  Continue spironolactone.  ESRD on hemodialysis. Nephrology consult for dialysis.  History of chronic A. fib.  Rate controlled, continue Coreg, Coumadin pharmacy to dose.  Hypertension.  Continue Coreg, hold losartan, spironolactone and Norvasc due to soft blood pressure.  All the records are reviewed and case discussed with ED provider. Management plans discussed with the patient, family and they are in agreement.  CODE STATUS: Full code  TOTAL TIME TAKING CARE OF THIS PATIENT: 35 minutes.    Demetrios Loll M.D on 05/16/2018 at 8:31 AM  Between 7am to 6pm - Pager - [phone]  After 6pm go to [email or web address] - Proofreader  [facility]  Office  [phone]  CC: Primary care physician; [facility]   Note: This dictation was prepared with Dragon dictation along with smaller phrase technology. Any transcriptional errors that result from this process are unin

## 2018-05-17 ENCOUNTER — Encounter: Payer: Self-pay | Admitting: Vascular Surgery

## 2018-05-17 ENCOUNTER — Inpatient Hospital Stay
Admit: 2018-05-17 | Discharge: 2018-05-17 | Disposition: A | Discharge status: Home / Self Care | Payer: Medicare Other | Attending: Internal Medicine | Admitting: Internal Medicine

## 2018-05-17 DIAGNOSIS — J9601 Acute respiratory failure with hypoxia: Secondary | ICD-10-CM | POA: Diagnosis not present

## 2018-05-17 DIAGNOSIS — R0602 Shortness of breath: Secondary | ICD-10-CM | POA: Diagnosis not present

## 2018-05-17 LAB — CBC
HCT: 29.7 % — ABNORMAL LOW (ref 36.0–46.0)
Hemoglobin: 8.7 g/dL — ABNORMAL LOW (ref 12.0–15.0)
MCH: 29 pg (ref 26.0–34.0)
MCHC: 29.3 g/dL — ABNORMAL LOW (ref 30.0–36.0)
MCV: 99 fL (ref 80.0–100.0)
Platelets: 136 10*3/uL — ABNORMAL LOW (ref 150–400)
RBC: 3 MIL/uL — ABNORMAL LOW (ref 3.87–5.11)
RDW: 15.6 % — ABNORMAL HIGH (ref 11.5–15.5)
WBC: 4.7 10*3/uL (ref 4.0–10.5)
nRBC: 0 % (ref 0.0–0.2)

## 2018-05-17 LAB — BASIC METABOLIC PANEL
Anion gap: 8 (ref 5–15)
BUN: 21 mg/dL (ref 8–23)
CHLORIDE: 99 mmol/L (ref 98–111)
CO2: 28 mmol/L (ref 22–32)
Calcium: 8.6 mg/dL — ABNORMAL LOW (ref 8.9–10.3)
Creatinine, Ser: 5.78 mg/dL — ABNORMAL HIGH (ref 0.44–1.00)
GFR calc Af Amer: 8 mL/min — ABNORMAL LOW (ref >60)
GFR calc non Af Amer: 7 mL/min — ABNORMAL LOW (ref >60)
GLUCOSE: 88 mg/dL (ref 70–99)
Potassium: 4.3 mmol/L (ref 3.5–5.1)
Sodium: 135 mmol/L (ref 135–145)

## 2018-05-17 LAB — ECHOCARDIOGRAM COMPLETE
Height: 67 in
Weight: 2656 oz

## 2018-05-17 LAB — PROTIME-INR
INR: 1.06
Prothrombin Time: 13.7 seconds (ref 11.4–15.2)

## 2018-05-17 MED ORDER — CHLORHEXIDINE GLUCONATE CLOTH 2 % EX PADS
6.0000 | MEDICATED_PAD | Freq: Every day | CUTANEOUS | Status: DC
  Administered 2018-05-17: 06:00:00 6 via TOPICAL

## 2018-05-17 MED ORDER — WARFARIN SODIUM 10 MG PO TABS
10.0000 mg | ORAL_TABLET | Freq: Once | ORAL | Status: DC
  Filled 2018-05-17: qty 1

## 2018-05-17 MED ORDER — MUPIROCIN 2 % EX OINT
1.0000 "application " | TOPICAL_OINTMENT | Freq: Two times a day (BID) | CUTANEOUS | Status: DC
  Administered 2018-05-17: 1 via NASAL
  Filled 2018-05-17: qty 22

## 2018-05-17 NOTE — Progress Notes (Deleted)
Pt's O2 on RA at rest 92%.  Pt ambulated around the nursing station on RA, O2 sats down to 87%,  HR up to 160.  Pt denies chest pain or any distress.  O2 sats up to mid 90's, HR down to 80's quickly after pt back to bed.  Pt remained on RA.  Dr Verdell Carmine made aware.  Per MD pt will stay in hospital today.  No new orders.

## 2018-05-17 NOTE — Care Management Note (Signed)
Case Management Note  Patient Details  Name: Toni Carlson MRN: [id number] Date of Birth: 1946-04-02  Subjective/Objective:  Admitted to [facility] with the diagnosis of acute respiratory failure. [place] term resident of [place]. Last seen medical representative of [facility] calls 05/03/18. Daughter is Tiffany [phone]).  [place] term dialysis patient at [facility] on [address])  Currently on 4 liters oxygen per nasal cannula acutely.                 Action/Plan: [facility] representative updated per secured message. Received referral for Occupational/physical if needed.  Will continue to follow for transition of care.    Expected Discharge Date:                  Expected Discharge Plan:     In-House Referral:   yes  Discharge planning Services   yes  Post Acute Care Choice:    Choice offered to:     DME Arranged:    DME Agency:     HH Arranged:    HH Agency:     Status of Service:     If discussed at [employer] of Stay Meetings, dates discussed:    Additional Comments:  Shelbie Ammons, RN MSN CCM Care Management [phone] 05/17/2018, 9:06 AM

## 2018-05-17 NOTE — Progress Notes (Signed)
[facility]  ROUNDING NOTE   Subjective:   Hemodialysis treatment yesterday. Tolerated treatment well. UF of 1.5 liter.   On room air this morning. Continues to complain of cough but no more shortness of breath.   Objective:  Vital signs in last 24 hours:  Temp:  [97.5 F (36.4 C)-99.1 F (37.3 C)] 98.2 F (36.8 C) (01/02 0800) Pulse Rate:  [35-71] 58 (01/02 0815) Resp:  [12-19] 19 (01/02 0426) BP: (108-140)/(45-65) 124/52 (01/02 0800) SpO2:  [94 %-100 %] 98 % (01/02 0800) Weight:  [72.1 kg-75.3 kg] 75.3 kg (01/02 0426)  Weight change: 4.037 kg Filed Weights   05/16/18 0950 05/16/18 1253 05/17/18 0426  Weight: 74.8 kg 72.1 kg 75.3 kg    Intake/Output: I/O last 3 completed shifts: In: -  Out: 1500 [Other:1500]   Intake/Output this shift:  No intake/output data recorded.  Physical Exam: General: NAD,   Head: Normocephalic, atraumatic. Moist oral mucosal membranes  Eyes: Anicteric, PERRL  Neck: Supple, trachea midline  Lungs:  Clear to auscultation  Heart: Regular rate and rhythm  Abdomen:  Soft, nontender,   Extremities: no peripheral edema.  Neurologic: Nonfocal, moving all four extremities  Skin: No lesions  Access: Left AVF    Basic Metabolic Panel: Recent Labs  Lab 05/16/18 0656 05/17/18 0500  NA 140 135  K 4.2 4.3  CL 101 99  CO2 27 28  GLUCOSE 86 88  BUN 34* 21  CREATININE 8.63* 5.78*  CALCIUM 8.6* 8.6*    Liver Function Tests: Recent Labs  Lab 05/16/18 0656  AST 20  ALT 11  ALKPHOS 91  BILITOT 0.7  PROT 7.3  ALBUMIN 3.6   Recent Labs  Lab 05/16/18 0656  LIPASE 21   No results for input(s): AMMONIA in the last 168 hours.  CBC: Recent Labs  Lab 05/16/18 0656 05/17/18 0500  WBC 5.3 4.7  NEUTROABS 3.8  --   HGB 9.0* 8.7*  HCT 31.5* 29.7*  MCV 100.6* 99.0  PLT 137* 136*    Cardiac Enzymes: No results for input(s): CKTOTAL, CKMB, CKMBINDEX, TROPONINI in the last 168 hours.  BNP: Invalid input(s):  POCBNP  CBG: No results for input(s): GLUCAP in the last 168 hours.  Microbiology: Results for orders placed or performed during the hospital encounter of 05/16/18  MRSA PCR Screening     Status: Abnormal   Collection Time: 05/16/18  2:18 PM  Result Value Ref Range Status   MRSA by PCR POSITIVE (A) NEGATIVE Final    Comment:        The GeneXpert MRSA Assay (FDA approved for NASAL specimens only), is one component of a comprehensive MRSA colonization surveillance program. It is not intended to diagnose MRSA infection nor to guide or monitor treatment for MRSA infections. RESULT CALLED TO, READ BACK BY AND VERIFIED WITH: C/ BRANDY FARRELL AT 1749 05/16/2018 Performed at [facility], [address] [place], [place] [address]     Coagulation Studies: Recent Labs    05/16/18 0917 05/17/18 0500  LABPROT 13.6 13.7  INR 1.05 1.06    Urinalysis: No results for input(s): COLORURINE, LABSPEC, PHURINE, GLUCOSEU, HGBUR, BILIRUBINUR, KETONESUR, PROTEINUR, UROBILINOGEN, NITRITE, LEUKOCYTESUR in the last 72 hours.  Invalid input(s): APPERANCEUR    Imaging: Dg Chest Port 1 View  Result Date: 05/16/2018 CLINICAL DATA:  Patient with nausea and vomiting. EXAM: PORTABLE CHEST 1 VIEW COMPARISON:  Chest radiograph 12/17/2016 FINDINGS: Monitoring leads overlie the patient. Stable cardiomegaly. Aortic atherosclerosis. Coarse bilateral interstitial pulmonary opacities.  No large area pulmonary consolidation. No pleural effusion or pneumothorax. IMPRESSION: Cardiomegaly. Mild interstitial opacities favored to represent edema. Electronically Signed   By: Lovey Newcomer M.D.   On: 05/16/2018 07:45     Medications:   . sodium chloride     . atorvastatin  40 mg Oral QHS  . calcium acetate  1,334 mg Oral TID AC  . carvedilol  6.25 mg Oral BID WC  . Chlorhexidine Gluconate Cloth  6 each Topical Q0600  . clopidogrel  75 mg Oral Daily  . epoetin (EPOGEN/PROCRIT) injection  10,000 Units  Intravenous Q M,W,F-HD  . escitalopram  10 mg Oral Daily  . gabapentin  300 mg Oral QHS  . loratadine  10 mg Oral Daily  . mupirocin ointment  1 application Nasal BID  . pantoprazole  40 mg Oral BID  . sodium chloride flush  3 mL Intravenous Q12H  . spironolactone  25 mg Oral Daily  . traZODone  25 mg Oral QHS  . warfarin  10 mg Oral ONCE-1800  . Warfarin - Pharmacist Dosing Inpatient   Does not apply q1800   sodium chloride, acetaminophen **OR** acetaminophen, albuterol, alum & mag hydroxide-simeth, bisacodyl, calcium carbonate, guaifenesin, HYDROcodone-acetaminophen, loperamide, magnesium hydroxide, ondansetron **OR** ondansetron (ZOFRAN) IV, polyethylene glycol, senna-docusate, sodium chloride flush, TRIPLE ANTIBIOTIC  Assessment/ Plan:  [name] is a 73 y.o. black female with end stage renal disease on hemodialysis, hypertension, coronary artery disease, CVA, pulmonary hypertension, carotid artery stenosis, colon mass, atrial fibrillation, depression, hyperlipidemia,   [facility] MWF [address] Left AVF 74.5kg.   1. End stage renal disease: tolerated hemodialysis treatment yesterday. UF goal of 1.5 liters.  MWF schedule.   2. Anemia of chronic kidney disease: hemoglobin 8.7. MCV 99 - EPO with HD treatment.   3. Secondary Hyperparathyroidism: PTH 533 outpatient.  - calcium acetate with meals.   4. Hypertension: blood pressure is at goal - carvedilol.    LOS: 1 Toni Carlson 1/2/202010:31 AM

## 2018-05-17 NOTE — Clinical Social Work Note (Signed)
Patient is medically ready for discharge today. CSW notified patient of discharge. CSW spoke with Lelon Frohlich at [facility] and notified her of discharge today. Lelon Frohlich states that they can not transport patient today and she would need to return by EMS. RN to call report and call for transport.   [place], [place]

## 2018-05-17 NOTE — Progress Notes (Signed)
*  PRELIMINARY RESULTS* Echocardiogram 2D Echocardiogram has been performed.  Toni Carlson 05/17/2018, 1:15 PM

## 2018-05-17 NOTE — Clinical Social Work Note (Signed)
Clinical Social Work Assessment  Patient Details  Name: Toni Carlson MRN: [id number] Date of Birth: 08/11/1945  Date of referral:  05/17/18               Reason for consult:  Facility Placement                Permission sought to share information with:  Case Manager, Customer service manager, Family Supports Permission granted to share information::  Yes, Verbal Permission Granted  Name::        Agency::     Relationship::     Contact Information:     Housing/Transportation Living arrangements for the past 2 months:  [place] of Information:  Patient Patient Interpreter Needed:  None Criminal Activity/Legal Involvement Pertinent to Current Situation/Hospitalization:  No - Comment as needed Significant Relationships:  Siblings Lives with:  Facility Resident Do you feel safe going back to the place where you live?  Yes Need for family participation in patient care:  No (Coment)  Care giving concerns:  Patient is a [place] term resident at [facility] assessment / plan:  CSW consulted for facility placement. CSW met with patient. Patient reports that she lives at [facility] and wants to return as soon as possible. Patient reports that she has some family members that are supportive but she mostly takes care of herself. CSW will follow for discharge planning.   Employment status:  Retired Forensic scientist:  Medicare PT Recommendations:  Not assessed at this time Information / Referral to community resources:     Patient/Family's Response to care:  Patient thanked CSW for assistance   Patient/Family's Understanding of and Emotional Response to Diagnosis, Current Treatment, and Prognosis:  Patient in agreement with discharge back to facility   Emotional Assessment Appearance:  Appears stated age Attitude/Demeanor/Rapport:    Affect (typically observed):  Accepting, Quiet Orientation:  Oriented to Self, Oriented to Place,  Oriented to  Time Alcohol / Substance use:  Not Applicable Psych involvement (Current and /or in the community):  No (Comment)  Discharge Needs  Concerns to be addressed:  Discharge Planning Concerns Readmission within the last 30 days:  No Current discharge risk:  None Barriers to Discharge:  Continued Medical Work up   [employer], [place] 05/17/2018, 1:49 PM

## 2018-05-17 NOTE — NC FL2 (Signed)
[place] LEVEL OF CARE SCREENING TOOL     IDENTIFICATION  Patient Name: Toni Carlson Birthdate: 11/19/1945 Sex: female Admission Date (Current Location): 05/16/2018  [place] and [place] Number:  Engineering geologist and Address:  [facility], [address] [place], [place] [address]      Provider Number: [id number]  Attending Physician Name and Address:  Demetrios Loll, MD  Relative Name and Phone Number:       Current Level of Care: Hospital Recommended Level of Care: [place] Prior Approval Number:    Date Approved/Denied:   PASRR Number:    Discharge Plan: Domiciliary (Rest home)    Current Diagnoses: Patient Active Problem List   Diagnosis Date Noted  . Acute respiratory failure with hypoxia ([place]) 05/16/2018  . Complication of AV dialysis fistula 04/17/2017  . 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 Hyperlipidemia 03/16/2017  . Complication from renal dialysis device 11/27/2016  . Gastric polyp 08/18/2015  . Anemia 07/15/2015  . GI bleed 07/15/2015  . Polyneuropathy 07/08/2015  . Degenerative disc disease, lumbar 07/08/2015  . Pancytopenia ([place]) 07/08/2015  . Generalized weakness 07/08/2015  . End stage renal disease on dialysis ([place]) 07/08/2015  . Atherosclerotic peripheral vascular disease ([employer]) 07/08/2015  . Pain in both feet 07/02/2015  . Gastric ulceration   . Acute GI bleeding   . GIB (gastrointestinal bleeding) 04/12/2015  . Left-sided weakness 04/01/2015  . Essential hypertension 04/01/2015  . Anemia of chronic disease 04/01/2015  . Elevated troponin 04/01/2015  . Pulmonary hypertension ([place]) 04/01/2015  . Atrial fibrillation ([place])   . CVA (cerebral infarction) 03/26/2015    Orientation RESPIRATION BLADDER Height & Weight     Self, Time, Place  Normal Continent Weight: 166 lb (75.3 kg) Height:  5\' 7"  (170.2 cm)  BEHAVIORAL SYMPTOMS/MOOD NEUROLOGICAL BOWEL NUTRITION STATUS   (none) (none) Continent Diet(Renal diet )  AMBULATORY STATUS COMMUNICATION OF NEEDS Skin   Supervision Verbally Normal                       Personal Care Assistance Level of Assistance  Bathing, Feeding, Dressing Bathing Assistance: Limited assistance Feeding assistance: Independent Dressing Assistance: Independent     Functional Limitations Info  Sight, Hearing, Speech Sight Info: Adequate Hearing Info: Adequate Speech Info: Adequate    SPECIAL CARE FACTORS FREQUENCY                       Contractures Contractures Info: Not present    Additional Factors Info  Code Status, Allergies Code Status Info: Full Code  Allergies Info: NKA           Current Medications (05/17/2018):  This is the current hospital active medication list Current Facility-Administered Medications  Medication Dose Route Frequency Provider Last Rate Last Dose  . 0.9 %  sodium chloride infusion  250 mL Intravenous PRN Demetrios Loll, MD      . acetaminophen (TYLENOL) tablet 650 mg  650 mg Oral Q6H PRN Demetrios Loll, MD       Or  . acetaminophen (TYLENOL) suppository 650 mg  650 mg Rectal Q6H PRN Demetrios Loll, MD      . albuterol (PROVENTIL) (2.5 MG/3ML) 0.083% nebulizer solution 2.5 mg  2.5 mg Nebulization Q2H PRN Demetrios Loll, MD      . alum & mag hydroxide-simeth (MAALOX/MYLANTA) 200-200-20 MG/5ML suspension 30 mL  30 mL Oral QID PRN Demetrios Loll, MD      .  atorvastatin (LIPITOR) tablet 40 mg  40 mg Oral QHS Demetrios Loll, MD   40 mg at 05/16/18 2318  . bisacodyl (DULCOLAX) EC tablet 5 mg  5 mg Oral Daily PRN Demetrios Loll, MD      . calcium acetate (PHOSLO) capsule 1,334 mg  1,334 mg Oral TID Otho Perl, MD   1,334 mg at 05/17/18 1224  . calcium carbonate (TUMS - dosed in mg elemental calcium) chewable tablet 200 mg of elemental calcium  1 tablet Oral TID PRN Demetrios Loll, MD      . carvedilol (COREG) tablet 6.25 mg  6.25 mg Oral BID WC Demetrios Loll, MD      . Chlorhexidine Gluconate Cloth 2 % PADS 6 each   6 each Topical Q0600 Demetrios Loll, MD   6 each at 05/17/18 [phone]  . clopidogrel (PLAVIX) tablet 75 mg  75 mg Oral Daily Demetrios Loll, MD   75 mg at 05/17/18 6962  . epoetin alfa (EPOGEN,PROCRIT) injection 10,000 Units  10,000 Units Intravenous Q M,W,F-HD Lavonia Dana, MD   10,000 Units at 05/16/18 1132  . escitalopram (LEXAPRO) tablet 10 mg  10 mg Oral Daily Demetrios Loll, MD   10 mg at 05/16/18 1416  . gabapentin (NEURONTIN) capsule 300 mg  300 mg Oral QHS Demetrios Loll, MD   300 mg at 05/16/18 2317  . guaifenesin (ROBITUSSIN) 100 MG/5ML syrup 200 mg  200 mg Oral Q6H PRN Demetrios Loll, MD   200 mg at 05/17/18 1048  . HYDROcodone-acetaminophen (NORCO/VICODIN) 5-325 MG per tablet 1-2 tablet  1-2 tablet Oral Q4H PRN Demetrios Loll, MD      . loperamide (IMODIUM) capsule 2 mg  2 mg Oral PRN Demetrios Loll, MD      . loratadine (CLARITIN) tablet 10 mg  10 mg Oral Daily Demetrios Loll, MD   10 mg at 05/17/18 9528  . magnesium hydroxide (MILK OF MAGNESIA) suspension 30 mL  30 mL Oral QHS PRN Demetrios Loll, MD      . mupirocin ointment (BACTROBAN) 2 % 1 application  1 application Nasal BID Demetrios Loll, MD   1 application at 41/32/44 [phone]  . ondansetron (ZOFRAN) tablet 4 mg  4 mg Oral Q6H PRN Demetrios Loll, MD       Or  . ondansetron [facility]) injection 4 mg  4 mg Intravenous Q6H PRN Demetrios Loll, MD      . pantoprazole (PROTONIX) EC tablet 40 mg  40 mg Oral BID Demetrios Loll, MD   40 mg at 05/17/18 7253  . polyethylene glycol (MIRALAX / GLYCOLAX) packet 17 g  17 g Oral Daily PRN Demetrios Loll, MD      . senna-docusate (Senokot-S) tablet 1 tablet  1 tablet Oral QHS PRN Demetrios Loll, MD      . sodium chloride flush (NS) 0.9 % injection 3 mL  3 mL Intravenous Q12H Demetrios Loll, MD   3 mL at 05/17/18 0835  . sodium chloride flush (NS) 0.9 % injection 3 mL  3 mL Intravenous PRN Demetrios Loll, MD      . spironolactone (ALDACTONE) tablet 25 mg  25 mg Oral Daily Demetrios Loll, MD   25 mg at 05/17/18 6644  . traZODone (DESYREL) tablet 25 mg  25 mg Oral QHS  Demetrios Loll, MD   25 mg at 05/16/18 2318  . TRIPLE ANTIBIOTIC [phone] OINT 1 application  1 application Apply externally BID BM & HS PRN Demetrios Loll, MD      . warfarin (COUMADIN)  tablet 10 mg  10 mg Oral ONCE-1800 Cyndee Brightly [place], [place]      . Warfarin - Pharmacist Dosing Inpatient   Does not apply q1800 Dallie Piles, [facility]         Discharge Medications: Please see discharge summary for a list of discharge medications.  Relevant Imaging Results:  Relevant Lab Results:   Additional Information    Charlie Seda  Louretta Shorten, LCSWA

## 2018-05-17 NOTE — Progress Notes (Signed)
Reported to geraldine at [place] for [place]

## 2018-05-17 NOTE — Discharge Summary (Signed)
[place] at [place] NAME: Toni Carlson    MR#:  [id number]  DATE OF BIRTH:  [birth year]  DATE OF ADMISSION:  05/16/2018   ADMITTING PHYSICIAN: Demetrios Loll, MD  DATE OF DISCHARGE: 05/17/2018  PRIMARY CARE PHYSICIAN: [facility]   ADMISSION DIAGNOSIS:  Shortness of breath [R06.02] Cough [R05] Acute pulmonary edema (HCC) [J81.0] ESRD (end stage renal disease) ([place]) [N18.6] DISCHARGE DIAGNOSIS:  Active Problems:   Acute respiratory failure with hypoxia ([place])  SECONDARY DIAGNOSIS:   Past Medical History:  Diagnosis Date  . A-fib (HCC)    a. on warfarin  . Anemia   . Carotid artery stenosis    a. ultrasound 03/2015: RICA 62-26% stenosis, LICA less than 33% stenosis  . Chronic diastolic CHF (congestive heart failure) ([place])    a. echo 03/2015: EF 60-65%, normal wall motion, diastolic parameters were c/w restrictive physiology, indicative of decreased LV diastolic compliance and/or increased LA pressure, mild AS, mild MR, left atrium was severely dilated, RA was severely dilated, PASP was severely increased at 90 mm Hg  . Coronary artery disease, non-occlusive    a. cardiac cath 2013  . ESRD (end stage renal disease) ([place])    On Tue-Thur-Sat dialysis  . H/O ischemic right MCA stroke    a. 03/2015; b. residual left-sided facial droop and slurred speech  . Hyperparathyroidism, secondary renal ([place])   . Hypertension   . Nicotine dependence   . Pulmonary HTN ([place])    as above  . Symptomatic bradycardia    a. history of symptomatic bradycardia; b. felt to be 2/2 metabolic abnormalities & required temp wire but no PPM, resolved with HD   HOSPITAL COURSE:  Acute respiratory failure with hypoxia due to pulmonary edema secondary to fluid overload She was on oxygen by nasal cannula 4 L, is off oxygen without hypoxia today. DuoNeb as needed. Acute respiratory failure improved with hemodialysis.  Acute on chronic diastolic  CHF. Treatment as above.  Continue spironolactone.  ESRD on hemodialysis.  History of chronic A. fib.  Rate controlled, continue Coreg.  Per patient, she is taking Plavix but not Coumadin.  She is off Coumadin. Follow-up with cardiologist as outpatient.  Hypertension.  Continue Coreg and spironolactone, hold losartan and Norvasc due to soft blood pressure.  DISCHARGE CONDITIONS:  Stable, discharge to assisted living today. CONSULTS OBTAINED:   DRUG ALLERGIES:  No Known Allergies DISCHARGE MEDICATIONS:   Allergies as of 05/17/2018   No Known Allergies     Medication List    STOP taking these medications   amLODipine 10 MG tablet Commonly known as:  NORVASC   losartan 100 MG tablet Commonly known as:  COZAAR     TAKE these medications   acetaminophen 500 MG tablet Commonly known as:  TYLENOL Take 500 mg by mouth every 4 (four) hours as needed for mild pain, fever or headache.   atorvastatin 40 MG tablet Commonly known as:  LIPITOR Take 1 tablet (40 mg total) by mouth daily. What changed:  when to take this   calcium acetate 667 MG capsule Commonly known as:  PHOSLO Take 1,334 mg by mouth 3 (three) times daily.   calcium carbonate 500 MG chewable tablet Commonly known as:  TUMS - dosed in mg elemental calcium Chew 1 tablet by mouth 3 (three) times daily as needed.   carvedilol 6.25 MG tablet Commonly known as:  COREG Take 1 tablet (6.25 mg total) by mouth 2 (two) times  daily with a meal.   cetirizine 10 MG tablet Commonly known as:  ZYRTEC Take 10 mg by mouth daily as needed.   clopidogrel 75 MG tablet Commonly known as:  PLAVIX Take 75 mg daily by mouth.   escitalopram 10 MG tablet Commonly known as:  LEXAPRO Take 10 mg by mouth daily.   gabapentin 300 MG capsule Commonly known as:  NEURONTIN Take 300 mg by mouth at bedtime.   guaifenesin 100 MG/5ML syrup Commonly known as:  ROBITUSSIN Take 200 mg by mouth every 6 (six) hours as needed for  cough.   loperamide 2 MG capsule Commonly known as:  IMODIUM Take 2 mg by mouth as needed for diarrhea or loose stools.   magnesium hydroxide 400 MG/5ML suspension Commonly known as:  MILK OF MAGNESIA Take 30 mLs by mouth at bedtime as needed for mild constipation.   [place] 200-200-20 MG/5ML suspension Generic drug:  alum & mag hydroxide-simeth Take 30 mLs by mouth 4 (four) times daily as needed for indigestion or heartburn.   ondansetron 4 MG tablet Commonly known as:  ZOFRAN Take 4 mg by mouth every 8 (eight) hours as needed for nausea or vomiting.   pantoprazole 40 MG tablet Commonly known as:  PROTONIX Take 1 tablet (40 mg total) by mouth 2 (two) times daily.   polyethylene glycol packet Commonly known as:  MIRALAX / GLYCOLAX Take 17 g by mouth daily as needed for mild constipation.   spironolactone 25 MG tablet Commonly known as:  ALDACTONE Take 1 tablet (25 mg total) by mouth daily.   traZODone 50 MG tablet Commonly known as:  DESYREL Take 25 mg by mouth at bedtime.   TRIPLE ANTIBIOTIC [phone] Oint Apply 1 application topically 3 times/day as needed-between meals & bedtime (for wound care).   trolamine salicylate 10 % cream Commonly known as:  ASPERCREME Apply topically 2 (two) times daily as needed for muscle pain.        DISCHARGE INSTRUCTIONS:  See AVS. If you experience worsening of your admission symptoms, develop shortness of breath, life threatening emergency, suicidal or homicidal thoughts you must seek medical attention immediately by calling 911 or calling your MD immediately  if symptoms less severe.  You Must read complete instructions/literature along with all the possible adverse reactions/side effects for all the Medicines you take and that have been prescribed to you. Take any new Medicines after you have completely understood and accpet all the possible adverse reactions/side effects.   Please note  You were cared for by a hospitalist  during your hospital stay. If you have any questions about your discharge medications or the care you received while you were in the hospital after you are discharged, you can call the unit and asked to speak with the hospitalist on call if the hospitalist that took care of you is not available. Once you are discharged, your primary care physician will handle any further medical issues. Please note that NO REFILLS for any discharge medications will be authorized once you are discharged, as it is imperative that you return to your primary care physician (or establish a relationship with a primary care physician if you do not have one) for your aftercare needs so that they can reassess your need for medications and monitor your lab values.    On the day of Discharge:  VITAL SIGNS:  Blood pressure (!) 124/52, pulse (!) 58, temperature 98.2 F (36.8 C), temperature source Oral, resp. rate 20, height 5\' 7"  (1.702 m),  weight 75.3 kg, SpO2 92 %. PHYSICAL EXAMINATION:  GENERAL:  73 y.o.-year-old patient lying in the bed with no acute distress.  EYES: Pupils equal, round, reactive to light and accommodation. No scleral icterus. Extraocular muscles intact.  HEENT: Head atraumatic, normocephalic. Oropharynx and nasopharynx clear.  NECK:  Supple, no jugular venous distention. No thyroid enlargement, no tenderness.  LUNGS: Normal breath sounds bilaterally, no wheezing, rales,rhonchi or crepitation. No use of accessory muscles of respiration.  CARDIOVASCULAR: S1, S2 normal. No murmurs, rubs, or gallops.  ABDOMEN: Soft, non-tender, non-distended. Bowel sounds present. No organomegaly or mass.  EXTREMITIES: No pedal edema, cyanosis, or clubbing.  NEUROLOGIC: Cranial nerves II through XII are intact. Muscle strength 5/5 in all extremities. Sensation intact. Gait not checked.  PSYCHIATRIC: The patient is alert and oriented x 3.  SKIN: No obvious rash, lesion, or ulcer.  DATA REVIEW:   CBC Recent Labs  Lab  05/17/18 0500  WBC 4.7  HGB 8.7*  HCT 29.7*  PLT 136*    Chemistries  Recent Labs  Lab 05/16/18 0656 05/17/18 0500  NA 140 135  K 4.2 4.3  CL 101 99  CO2 27 28  GLUCOSE 86 88  BUN 34* 21  CREATININE 8.63* 5.78*  CALCIUM 8.6* 8.6*  AST 20  --   ALT 11  --   ALKPHOS 91  --   BILITOT 0.7  --      Microbiology Results  Results for orders placed or performed during the hospital encounter of 05/16/18  MRSA PCR Screening     Status: Abnormal   Collection Time: 05/16/18  2:18 PM  Result Value Ref Range Status   MRSA by PCR POSITIVE (A) NEGATIVE Final    Comment:        The GeneXpert MRSA Assay (FDA approved for NASAL specimens only), is one component of a comprehensive MRSA colonization surveillance program. It is not intended to diagnose MRSA infection nor to guide or monitor treatment for MRSA infections. RESULT CALLED TO, READ BACK BY AND VERIFIED WITH: Brett Albino FARRELL AT 4496 05/16/2018 Performed at [facility], [address] [place], [place] [address]     RADIOLOGY:  No results found.   Management plans discussed with the patient, family and they are in agreement.  CODE STATUS: Full Code   TOTAL TIME TAKING CARE OF THIS PATIENT: 35 minutes.    Demetrios Loll M.D on 05/17/2018 at 2:59 PM  Between 7am to 6pm - Pager - [phone]  After 6pm go to [email or web address] - Proofreader  [facility]  Office  [phone]  CC: Primary care physician; [facility]   Note: This dictation was prepared with Dragon dictation along with smaller phrase technology. Any transcriptional errors that result from this process are unintentional.

## 2018-05-17 NOTE — Progress Notes (Signed)
Pt ambulated to the nursing unit and back to room.  Pt ambulated with walker on RA.  O2 sats 90%-92%.

## 2018-05-17 NOTE — Progress Notes (Signed)
Reviewed AVS with pt. Pt verbalized understanding. Client being transported to [facility] via EMS. VSS no signs of distress

## 2018-05-17 NOTE — Consult Note (Signed)
ANTICOAGULATION CONSULT NOTE  Pharmacy Consult for warfarin dosing Indication: atrial fibrillation  No Known Allergies  Patient Measurements: Height: 5\' 7"  (170.2 cm) Weight: 166 lb (75.3 kg) IBW/kg (Calculated) : 61.6  Vital Signs: Temp: 98.4 F (36.9 C) (01/02 0426) BP: 118/58 (01/02 0426) Pulse Rate: 69 (01/02 0426)  Labs: Recent Labs    05/16/18 0656 05/16/18 0917 05/17/18 0500  HGB 9.0*  --  8.7*  HCT 31.5*  --  29.7*  PLT 137*  --  136*  LABPROT  --  13.6 13.7  INR  --  1.05 1.06  CREATININE 8.63*  --  5.78*    Estimated Creatinine Clearance: 9.6 mL/min (A) (by C-G formula based on SCr of 5.78 mg/dL (H)).   Medical History: Past Medical History:  Diagnosis Date  . A-fib (HCC)    a. on warfarin  . Anemia   . Carotid artery stenosis    a. ultrasound 03/2015: RICA 16-01% stenosis, LICA less than 09% stenosis  . Chronic diastolic CHF (congestive heart failure) ([place])    a. echo 03/2015: EF 60-65%, normal wall motion, diastolic parameters were c/w restrictive physiology, indicative of decreased LV diastolic compliance and/or increased LA pressure, mild AS, mild MR, left atrium was severely dilated, RA was severely dilated, PASP was severely increased at 90 mm Hg  . Coronary artery disease, non-occlusive    a. cardiac cath 2013  . ESRD (end stage renal disease) ([place])    On Tue-Thur-Sat dialysis  . H/O ischemic right MCA stroke    a. 03/2015; b. residual left-sided facial droop and slurred speech  . Hyperparathyroidism, secondary renal ([place])   . Hypertension   . Nicotine dependence   . Pulmonary HTN ([place])    as above  . Symptomatic bradycardia    a. history of symptomatic bradycardia; b. felt to be 2/2 metabolic abnormalities & required temp wire but no PPM, resolved with HD   Assessment: 73 y.o. female with a history of A. Fib. She is noted to be on warfarin PTA. However, she comes from [facility] where warfarin does not show up even in the discontinued  medication section. Additionally, her baseline INR is approximately 1 second. In the past she was on 7.5mg  daily but there are no INR records to follow. There are no significant DDIs to note except clopidogrel. CHADsVASC score at least 5.   Goal of Therapy:  INR 2-3 Monitor platelets by anticoagulation protocol: Yes   Plan:  1/2 INR 1.06 Did take 1/1 warfarin dose of 7.5 mg. Will order warfarin 10 mg PO tonight as 7.5 mg dose did not increase INR. Will check INR/CBC with AM labs.   Paticia Stack, PharmD Pharmacy Resident  05/17/2018 8:16 AM

## 2018-05-18 LAB — HIV ANTIBODY (ROUTINE TESTING W REFLEX): HIV Screen 4th Generation wRfx: NONREACTIVE

## 2018-05-28 ENCOUNTER — Inpatient Hospital Stay
Admission: EM | Admit: 2018-05-28 | Discharge: 2018-05-31 | DRG: 193 | Disposition: A | Discharge status: Home Health | Payer: Medicare Other | Attending: Internal Medicine | Admitting: Internal Medicine

## 2018-05-28 ENCOUNTER — Emergency Department: Payer: Medicare Other

## 2018-05-28 ENCOUNTER — Other Ambulatory Visit: Payer: Self-pay

## 2018-05-28 DIAGNOSIS — Z79899 Other long term (current) drug therapy: Secondary | ICD-10-CM | POA: Diagnosis not present

## 2018-05-28 DIAGNOSIS — I4891 Unspecified atrial fibrillation: Secondary | ICD-10-CM | POA: Diagnosis present

## 2018-05-28 DIAGNOSIS — Z87891 Personal history of nicotine dependence: Secondary | ICD-10-CM | POA: Diagnosis not present

## 2018-05-28 DIAGNOSIS — F329 Major depressive disorder, single episode, unspecified: Secondary | ICD-10-CM | POA: Diagnosis present

## 2018-05-28 DIAGNOSIS — Z8249 Family history of ischemic heart disease and other diseases of the circulatory system: Secondary | ICD-10-CM

## 2018-05-28 DIAGNOSIS — I132 Hypertensive heart and chronic kidney disease with heart failure and with stage 5 chronic kidney disease, or end stage renal disease: Secondary | ICD-10-CM | POA: Diagnosis present

## 2018-05-28 DIAGNOSIS — I5032 Chronic diastolic (congestive) heart failure: Secondary | ICD-10-CM | POA: Diagnosis present

## 2018-05-28 DIAGNOSIS — Y95 Nosocomial condition: Secondary | ICD-10-CM | POA: Diagnosis present

## 2018-05-28 DIAGNOSIS — I2510 Atherosclerotic heart disease of native coronary artery without angina pectoris: Secondary | ICD-10-CM | POA: Diagnosis present

## 2018-05-28 DIAGNOSIS — I6529 Occlusion and stenosis of unspecified carotid artery: Secondary | ICD-10-CM | POA: Diagnosis present

## 2018-05-28 DIAGNOSIS — R4781 Slurred speech: Secondary | ICD-10-CM | POA: Diagnosis present

## 2018-05-28 DIAGNOSIS — Z8673 Personal history of transient ischemic attack (TIA), and cerebral infarction without residual deficits: Secondary | ICD-10-CM | POA: Diagnosis not present

## 2018-05-28 DIAGNOSIS — Z7902 Long term (current) use of antithrombotics/antiplatelets: Secondary | ICD-10-CM | POA: Diagnosis not present

## 2018-05-28 DIAGNOSIS — N2581 Secondary hyperparathyroidism of renal origin: Secondary | ICD-10-CM | POA: Diagnosis present

## 2018-05-28 DIAGNOSIS — Z992 Dependence on renal dialysis: Secondary | ICD-10-CM | POA: Diagnosis not present

## 2018-05-28 DIAGNOSIS — E785 Hyperlipidemia, unspecified: Secondary | ICD-10-CM | POA: Diagnosis present

## 2018-05-28 DIAGNOSIS — J159 Unspecified bacterial pneumonia: Principal | ICD-10-CM | POA: Diagnosis present

## 2018-05-28 DIAGNOSIS — R29810 Facial weakness: Secondary | ICD-10-CM | POA: Diagnosis present

## 2018-05-28 DIAGNOSIS — N186 End stage renal disease: Secondary | ICD-10-CM

## 2018-05-28 DIAGNOSIS — D631 Anemia in chronic kidney disease: Secondary | ICD-10-CM | POA: Diagnosis present

## 2018-05-28 DIAGNOSIS — I272 Pulmonary hypertension, unspecified: Secondary | ICD-10-CM | POA: Diagnosis present

## 2018-05-28 DIAGNOSIS — J069 Acute upper respiratory infection, unspecified: Secondary | ICD-10-CM | POA: Diagnosis present

## 2018-05-28 DIAGNOSIS — J181 Lobar pneumonia, unspecified organism: Secondary | ICD-10-CM

## 2018-05-28 DIAGNOSIS — J189 Pneumonia, unspecified organism: Secondary | ICD-10-CM | POA: Diagnosis present

## 2018-05-28 DIAGNOSIS — I509 Heart failure, unspecified: Secondary | ICD-10-CM

## 2018-05-28 DIAGNOSIS — B9789 Other viral agents as the cause of diseases classified elsewhere: Secondary | ICD-10-CM

## 2018-05-28 LAB — CBC
HCT: 29.6 % — ABNORMAL LOW (ref 36.0–46.0)
Hemoglobin: 8.8 g/dL — ABNORMAL LOW (ref 12.0–15.0)
MCH: 29.6 pg (ref 26.0–34.0)
MCHC: 29.7 g/dL — ABNORMAL LOW (ref 30.0–36.0)
MCV: 99.7 fL (ref 80.0–100.0)
NRBC: 0 % (ref 0.0–0.2)
Platelets: 156 10*3/uL (ref 150–400)
RBC: 2.97 MIL/uL — ABNORMAL LOW (ref 3.87–5.11)
RDW: 16.8 % — ABNORMAL HIGH (ref 11.5–15.5)
WBC: 10.5 10*3/uL (ref 4.0–10.5)

## 2018-05-28 LAB — GROUP A STREP BY PCR: Group A Strep by PCR: NOT DETECTED

## 2018-05-28 LAB — COMPREHENSIVE METABOLIC PANEL
ALT: 16 U/L (ref 0–44)
AST: 26 U/L (ref 15–41)
Albumin: 3.9 g/dL (ref 3.5–5.0)
Alkaline Phosphatase: 95 U/L (ref 38–126)
Anion gap: 13 (ref 5–15)
BUN: 36 mg/dL — ABNORMAL HIGH (ref 8–23)
CO2: 28 mmol/L (ref 22–32)
Calcium: 8.5 mg/dL — ABNORMAL LOW (ref 8.9–10.3)
Chloride: 98 mmol/L (ref 98–111)
Creatinine, Ser: 8.81 mg/dL — ABNORMAL HIGH (ref 0.44–1.00)
GFR calc Af Amer: 5 mL/min — ABNORMAL LOW (ref >60)
GFR calc non Af Amer: 4 mL/min — ABNORMAL LOW (ref >60)
GLUCOSE: 105 mg/dL — AB (ref 70–99)
Potassium: 4.1 mmol/L (ref 3.5–5.1)
Sodium: 139 mmol/L (ref 135–145)
Total Bilirubin: 0.8 mg/dL (ref 0.3–1.2)
Total Protein: 7.4 g/dL (ref 6.5–8.1)

## 2018-05-28 LAB — BRAIN NATRIURETIC PEPTIDE: B Natriuretic Peptide: 1652 pg/mL — ABNORMAL HIGH (ref 0.0–100.0)

## 2018-05-28 LAB — PHOSPHORUS: Phosphorus: 1.7 mg/dL — ABNORMAL LOW (ref 2.5–4.6)

## 2018-05-28 LAB — TSH: TSH: 2.238 u[IU]/mL (ref 0.350–4.500)

## 2018-05-28 LAB — LIPASE, BLOOD: Lipase: 18 U/L (ref 11–51)

## 2018-05-28 LAB — PROCALCITONIN: PROCALCITONIN: 0.33 ng/mL

## 2018-05-28 LAB — INFLUENZA PANEL BY PCR (TYPE A & B)
Influenza A By PCR: NEGATIVE
Influenza B By PCR: NEGATIVE

## 2018-05-28 LAB — TROPONIN I: Troponin I: 0.06 ng/mL (ref <0.03)

## 2018-05-28 LAB — LACTIC ACID, PLASMA: Lactic Acid, Venous: 0.6 mmol/L (ref 0.5–1.9)

## 2018-05-28 LAB — PROTIME-INR
INR: 1.1
Prothrombin Time: 14.1 seconds (ref 11.4–15.2)

## 2018-05-28 MED ORDER — GUAIFENESIN 100 MG/5ML PO SOLN
200.0000 mg | Freq: Four times a day (QID) | ORAL | Status: DC | PRN
  Administered 2018-05-28 – 2018-05-30 (×3): 200 mg via ORAL
  Filled 2018-05-28 (×5): qty 10

## 2018-05-28 MED ORDER — HEPARIN SODIUM (PORCINE) 1000 UNIT/ML DIALYSIS: 1000.0000 [IU] | INTRAMUSCULAR | Status: DC | PRN

## 2018-05-28 MED ORDER — LOPERAMIDE HCL 2 MG PO CAPS: 2.0000 mg | ORAL_CAPSULE | ORAL | Status: DC | PRN

## 2018-05-28 MED ORDER — PANTOPRAZOLE SODIUM 40 MG PO TBEC
40.0000 mg | DELAYED_RELEASE_TABLET | Freq: Two times a day (BID) | ORAL | Status: DC
  Administered 2018-05-28 – 2018-05-31 (×7): 40 mg via ORAL
  Filled 2018-05-28 (×7): qty 1

## 2018-05-28 MED ORDER — TRAZODONE HCL 50 MG PO TABS
50.0000 mg | ORAL_TABLET | Freq: Every day | ORAL | Status: DC
  Administered 2018-05-28 – 2018-05-30 (×3): 50 mg via ORAL
  Filled 2018-05-28 (×3): qty 1

## 2018-05-28 MED ORDER — BISACODYL 5 MG PO TBEC: 5.0000 mg | DELAYED_RELEASE_TABLET | Freq: Every day | ORAL | Status: DC | PRN

## 2018-05-28 MED ORDER — CALCIUM ACETATE (PHOS BINDER) 667 MG PO CAPS
1334.0000 mg | ORAL_CAPSULE | Freq: Three times a day (TID) | ORAL | Status: DC
  Administered 2018-05-29 – 2018-05-31 (×6): 1334 mg via ORAL
  Filled 2018-05-28 (×6): qty 2

## 2018-05-28 MED ORDER — HYDROCOD POLST-CPM POLST ER 10-8 MG/5ML PO SUER
5.0000 mL | Freq: Once | ORAL | Status: AC
  Administered 2018-05-28: 5 mL via ORAL
  Filled 2018-05-28: qty 5

## 2018-05-28 MED ORDER — SODIUM CHLORIDE 0.9 % IV SOLN: 100.0000 mL | INTRAVENOUS | Status: DC | PRN

## 2018-05-28 MED ORDER — VANCOMYCIN HCL 10 G IV SOLR
1500.0000 mg | Freq: Once | INTRAVENOUS | Status: AC
  Administered 2018-05-28: 1500 mg via INTRAVENOUS
  Filled 2018-05-28: qty 1500

## 2018-05-28 MED ORDER — SODIUM CHLORIDE 0.9 % IV SOLN
500.0000 mg | INTRAVENOUS | Status: DC
  Administered 2018-05-28: 500 mg via INTRAVENOUS
  Filled 2018-05-28: qty 500

## 2018-05-28 MED ORDER — ALUM & MAG HYDROXIDE-SIMETH 200-200-20 MG/5ML PO SUSP: 30.0000 mL | Freq: Four times a day (QID) | ORAL | Status: DC | PRN

## 2018-05-28 MED ORDER — CLOPIDOGREL BISULFATE 75 MG PO TABS
75.0000 mg | ORAL_TABLET | Freq: Every day | ORAL | Status: DC
  Administered 2018-05-28 – 2018-05-31 (×4): 75 mg via ORAL
  Filled 2018-05-28 (×4): qty 1

## 2018-05-28 MED ORDER — GABAPENTIN 300 MG PO CAPS
300.0000 mg | ORAL_CAPSULE | Freq: Every day | ORAL | Status: DC
  Administered 2018-05-28 – 2018-05-30 (×3): 300 mg via ORAL
  Filled 2018-05-28 (×3): qty 1

## 2018-05-28 MED ORDER — ATORVASTATIN CALCIUM 20 MG PO TABS
40.0000 mg | ORAL_TABLET | Freq: Every day | ORAL | Status: DC
  Administered 2018-05-28 – 2018-05-30 (×3): 40 mg via ORAL
  Filled 2018-05-28 (×3): qty 2

## 2018-05-28 MED ORDER — PENTAFLUOROPROP-TETRAFLUOROETH EX AERO
1.0000 "application " | INHALATION_SPRAY | CUTANEOUS | Status: DC | PRN
  Filled 2018-05-28: qty 30

## 2018-05-28 MED ORDER — ONDANSETRON HCL 4 MG PO TABS: 4.0000 mg | ORAL_TABLET | Freq: Four times a day (QID) | ORAL | Status: DC | PRN

## 2018-05-28 MED ORDER — ONDANSETRON HCL 4 MG/2ML IJ SOLN
4.0000 mg | Freq: Four times a day (QID) | INTRAMUSCULAR | Status: DC | PRN
  Administered 2018-05-29 – 2018-05-30 (×2): 4 mg via INTRAVENOUS
  Filled 2018-05-28 (×2): qty 2

## 2018-05-28 MED ORDER — SODIUM CHLORIDE 0.9 % IV SOLN
1.0000 g | INTRAVENOUS | Status: DC
  Administered 2018-05-28 – 2018-05-29 (×2): 1 g via INTRAVENOUS
  Filled 2018-05-28 (×4): qty 1

## 2018-05-28 MED ORDER — LIDOCAINE HCL (PF) 1 % IJ SOLN
5.0000 mL | INTRAMUSCULAR | Status: DC | PRN
  Filled 2018-05-28: qty 5

## 2018-05-28 MED ORDER — TROLAMINE SALICYLATE 10 % EX CREA
TOPICAL_CREAM | Freq: Two times a day (BID) | CUTANEOUS | Status: DC | PRN
  Filled 2018-05-28: qty 85

## 2018-05-28 MED ORDER — TRIPLE ANTIBIOTIC 3.5-400-5000 EX OINT
1.0000 "application " | TOPICAL_OINTMENT | Freq: Two times a day (BID) | CUTANEOUS | Status: DC | PRN
  Filled 2018-05-28: qty 1

## 2018-05-28 MED ORDER — CALCIUM ACETATE (PHOS BINDER) 667 MG PO CAPS: 1334.0000 mg | ORAL_CAPSULE | Freq: Three times a day (TID) | ORAL | Status: DC

## 2018-05-28 MED ORDER — ESCITALOPRAM OXALATE 10 MG PO TABS
10.0000 mg | ORAL_TABLET | Freq: Every day | ORAL | Status: DC
  Administered 2018-05-28 – 2018-05-31 (×4): 10 mg via ORAL
  Filled 2018-05-28 (×4): qty 1

## 2018-05-28 MED ORDER — MUPIROCIN 2 % EX OINT
1.0000 "application " | TOPICAL_OINTMENT | Freq: Two times a day (BID) | CUTANEOUS | Status: DC
  Administered 2018-05-28 – 2018-05-31 (×6): 1 via NASAL
  Filled 2018-05-28: qty 22

## 2018-05-28 MED ORDER — HEPARIN SODIUM (PORCINE) 5000 UNIT/ML IJ SOLN
5000.0000 [IU] | Freq: Three times a day (TID) | INTRAMUSCULAR | Status: DC
  Administered 2018-05-28 – 2018-05-31 (×8): 5000 [IU] via SUBCUTANEOUS
  Filled 2018-05-28 (×8): qty 1

## 2018-05-28 MED ORDER — SODIUM CHLORIDE 0.9 % IV SOLN
2.0000 g | INTRAVENOUS | Status: DC
  Administered 2018-05-28: 2 g via INTRAVENOUS
  Filled 2018-05-28: qty 20

## 2018-05-28 MED ORDER — ACETAMINOPHEN 650 MG RE SUPP: 650.0000 mg | Freq: Four times a day (QID) | RECTAL | Status: DC | PRN

## 2018-05-28 MED ORDER — SPIRONOLACTONE 25 MG PO TABS
25.0000 mg | ORAL_TABLET | Freq: Every day | ORAL | Status: DC
  Administered 2018-05-29 – 2018-05-30 (×2): 25 mg via ORAL
  Filled 2018-05-28 (×2): qty 1

## 2018-05-28 MED ORDER — DOCUSATE SODIUM 100 MG PO CAPS
100.0000 mg | ORAL_CAPSULE | Freq: Two times a day (BID) | ORAL | Status: DC
  Administered 2018-05-28 – 2018-05-31 (×7): 100 mg via ORAL
  Filled 2018-05-28 (×7): qty 1

## 2018-05-28 MED ORDER — ACETAMINOPHEN 325 MG PO TABS
650.0000 mg | ORAL_TABLET | Freq: Four times a day (QID) | ORAL | Status: DC | PRN
  Administered 2018-05-30: 650 mg via ORAL
  Filled 2018-05-28: qty 2

## 2018-05-28 MED ORDER — CALCIUM CARBONATE ANTACID 500 MG PO CHEW: 1.0000 | CHEWABLE_TABLET | Freq: Three times a day (TID) | ORAL | Status: DC | PRN

## 2018-05-28 MED ORDER — CARVEDILOL 6.25 MG PO TABS
6.2500 mg | ORAL_TABLET | Freq: Two times a day (BID) | ORAL | Status: DC
  Administered 2018-05-29 (×2): 6.25 mg via ORAL
  Filled 2018-05-28 (×2): qty 1

## 2018-05-28 MED ORDER — ALTEPLASE 2 MG IJ SOLR: 2.0000 mg | Freq: Once | INTRAMUSCULAR | Status: DC | PRN

## 2018-05-28 MED ORDER — CHLORHEXIDINE GLUCONATE CLOTH 2 % EX PADS
6.0000 | MEDICATED_PAD | Freq: Every day | CUTANEOUS | Status: DC
  Administered 2018-05-29 – 2018-05-31 (×3): 6 via TOPICAL

## 2018-05-28 MED ORDER — POLYETHYLENE GLYCOL 3350 17 G PO PACK: 17.0000 g | PACK | Freq: Every day | ORAL | Status: DC | PRN

## 2018-05-28 MED ORDER — LIDOCAINE-PRILOCAINE 2.5-2.5 % EX CREA
1.0000 "application " | TOPICAL_CREAM | CUTANEOUS | Status: DC | PRN
  Filled 2018-05-28: qty 5

## 2018-05-28 MED ORDER — VANCOMYCIN HCL IN DEXTROSE 750-5 MG/150ML-% IV SOLN
750.0000 mg | INTRAVENOUS | Status: DC
  Filled 2018-05-28: qty 150

## 2018-05-28 MED ORDER — LORATADINE 10 MG PO TABS
10.0000 mg | ORAL_TABLET | Freq: Every day | ORAL | Status: DC
  Administered 2018-05-28 – 2018-05-31 (×4): 10 mg via ORAL
  Filled 2018-05-28 (×4): qty 1

## 2018-05-28 NOTE — Progress Notes (Signed)
tx ended without problems   05/28/18 1653  Vital Signs  Temp 98.2 F (36.8 C)  Temp Source Oral  Pulse Rate Source Monitor  BP (!) 155/72  BP Location Right Arm  BP Method Other (Comment) (Dialysis machine)  Patient Position (if appropriate) Lying  During Hemodialysis Assessment  Blood Flow Rate (mL/min) 400 mL/min  Arterial Pressure (mmHg) -170 mmHg  Venous Pressure (mmHg) 180 mmHg  Transmembrane Pressure (mmHg) 70 mmHg  Ultrafiltration Rate (mL/min) 300 mL/min  Dialysate Flow Rate (mL/min) 600 ml/min  Conductivity: Machine  13.9  HD Safety Checks Performed Yes  Dialysis Fluid Bolus Normal Saline  Bolus Amount (mL) 250 mL  Intra-Hemodialysis Comments Tx completed;Tolerated well (3010 mls)

## 2018-05-28 NOTE — ED Triage Notes (Signed)
Pt arrives via ems from [facility] with cough and congestion for the past 2 weeks, pt states that her throat is so sore that she can't eat. pt coughing so hard during triage she is gagging and losing her breath, o2 sats on ra 90-91%. Pt receives dialysis every Monday, Wednesday, and friday

## 2018-05-28 NOTE — Progress Notes (Signed)
Pt resting comfortably.   05/28/18 1430  Vital Signs  Pulse Rate 82  Pulse Rate Source Monitor  BP 140/75  BP Location Right Arm  During Hemodialysis Assessment  Blood Flow Rate (mL/min) 400 mL/min  Arterial Pressure (mmHg) -150 mmHg  Venous Pressure (mmHg) 160 mmHg  Transmembrane Pressure (mmHg) 70 mmHg  Ultrafiltration Rate (mL/min) 710 mL/min  Dialysate Flow Rate (mL/min) 600 ml/min  Conductivity: Machine  13.87  HD Safety Checks Performed Yes  Intra-Hemodialysis Comments Progressing as prescribed

## 2018-05-28 NOTE — Progress Notes (Signed)
Post dialysis assessment 

## 2018-05-28 NOTE — ED Notes (Signed)
ED Provider at bedside. 

## 2018-05-28 NOTE — Progress Notes (Signed)
Pt stable hemodynamically, tx started without a problem.   05/28/18 1321  During Hemodialysis Assessment  Blood Flow Rate (mL/min) 400 mL/min  Arterial Pressure (mmHg) -110 mmHg  Venous Pressure (mmHg) 160 mmHg  Transmembrane Pressure (mmHg) 70 mmHg  Ultrafiltration Rate (mL/min) 710 mL/min  Dialysate Flow Rate (mL/min) 600 ml/min  Conductivity: Machine  13.9  HD Safety Checks Performed Yes  Dialysis Fluid Bolus Normal Saline  Bolus Amount (mL) 250 mL  Intra-Hemodialysis Comments Tx initiated

## 2018-05-28 NOTE — Progress Notes (Signed)
[facility] [place] NAME: Toni Carlson    MR#:  [id number]  DATE OF BIRTH:  04-02-1946  SUBJECTIVE:  CHIEF COMPLAINT:   Chief Complaint  Patient presents with  . Cough  . Shortness of Breath  . Sore Throat   No new complaint this morning.  Admitted today with healthcare associated pneumonia.  Still has some cough.  Shortness of breath improving.  REVIEW OF SYSTEMS:  Review of Systems  Constitutional: Negative for chills and fever.  HENT: Negative for hearing loss and tinnitus.   Eyes: Negative for blurred vision and double vision.  Respiratory: Positive for cough and shortness of breath. Negative for hemoptysis and sputum production.   Cardiovascular: Negative for chest pain and palpitations.  Gastrointestinal: Negative for heartburn.  Genitourinary: Negative for dysuria and urgency.  Musculoskeletal: Negative for myalgias and neck pain.  Neurological: Negative for dizziness and headaches.  Psychiatric/Behavioral: Negative for depression and hallucinations.      DRUG ALLERGIES:  No Known Allergies VITALS:  Blood pressure (!) 149/54, pulse 83, temperature 97.7 F (36.5 C), temperature source Oral, resp. rate 18, height 5\' 7"  (1.702 m), weight 68 kg, SpO2 96 %. PHYSICAL EXAMINATION:   Physical Exam  Constitutional: She is oriented to person, place, and time and well-developed, well-nourished, and in no distress.  HENT:  Head: Normocephalic and atraumatic.  Eyes: Pupils are equal, round, and reactive to light. EOM are normal.  Neck: Normal range of motion. Neck supple.  Cardiovascular: Normal rate and regular rhythm.  Pulmonary/Chest: Effort normal. She has rales.  Abdominal: Soft. Bowel sounds are normal. She exhibits no distension.  Neurological: She is alert and oriented to person, place, and time.  Skin: Skin is warm. She is not diaphoretic.  Psychiatric: Affect and judgment normal.   LABORATORY PANEL:  Female CBC Recent  Labs  Lab 05/28/18 0112  WBC 10.5  HGB 8.8*  HCT 29.6*  PLT 156   ------------------------------------------------------------------------------------------------------------------ Chemistries  Recent Labs  Lab 05/28/18 0112  NA 139  K 4.1  CL 98  CO2 28  GLUCOSE 105*  BUN 36*  CREATININE 8.81*  CALCIUM 8.5*  AST 26  ALT 16  ALKPHOS 95  BILITOT 0.8   RADIOLOGY:  Dg Chest 2 View  Result Date: 05/28/2018 CLINICAL DATA:  Cough, congestion, hypoxia EXAM: CHEST - 2 VIEW COMPARISON:  05/16/2018 FINDINGS: Cardiomegaly with increased interstitial markings, favoring mild interstitial edema. No definite pleural effusions. Mild right basilar opacity favors platelike atelectasis, less likely mild infection. No pneumothorax. Thoracic aortic atherosclerosis. Left sided vascular stent. IMPRESSION: Cardiomegaly with increased interstitial markings, favoring mild interstitial edema. Mild right basilar opacity favors atelectasis, less likely mild infection. Electronically Signed   By: Julian Hy M.D.   On: 05/28/2018 02:10   Ct Chest Wo Contrast  Result Date: 05/28/2018 CLINICAL DATA:  Cough and congestion for 2 weeks. Sore throat. Decreased oxygen saturation. Dialysis patient. EXAM: CT CHEST WITHOUT CONTRAST TECHNIQUE: Multidetector CT imaging of the chest was performed following the standard protocol without IV contrast. COMPARISON:  None. FINDINGS: Cardiovascular: Cardiac enlargement. Coronary artery calcification. Mitral and aortic valve calcification. No pericardial effusions. Diffuse calcification of the aorta. Normal caliber thoracic aorta. Mediastinum/Nodes: Esophagus is decompressed. Thyroid gland is unremarkable. Diffusely scattered mediastinal lymph nodes without pathologic enlargement, likely reactive. Lungs/Pleura: Focal consolidation in the superior segment left lower lung likely represents pneumonia. Atelectasis in both lung bases. No pleural effusions. No pneumothorax. Airways  are patent. Upper Abdomen: Kidneys  are atrophic and calcified bilaterally consistent with history of end-stage renal disease. Prominent vascular calcifications in the upper abdomen. 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 IMPRESSION: 1. Focal consolidation in the superior segment left lower lung likely represents pneumonia. Atelectasis in both lung bases. 2. Atrophic and calcified kidneys consistent with history of end-stage renal disease. 3. 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 4. Diffuse cardiac enlargement. Aortic Atherosclerosis (ICD10-I70.0). Electronically Signed   By: Lucienne Capers M.D.   On: 05/28/2018 03:00   ASSESSMENT AND PLAN:    This is a 73 year old female admitted for pneumonia.  1.  Pneumonia: Healthcare associated;  Initially placed on IV Levaquin and vancomycin.  Levofloxacin subsequently discontinued and patient placed on IV cefepime for adequate gram-negative coverage.  Monitor closely and de-escalate in a.m. if patient continues to improve.  Follow-up on cultures  2.  ESRD: On dialysis; the patient is fluid overloaded at this time. Nephrology service consulted for inpatient hemodialysis  3.  Hypertension: Controlled; continue Coreg   4.  CHF: Chronic; diastolic.  Continue spironolactone. 5.  CAD: Stable; continue Plavix 6.  Hyperlipidemia: Continue statin therapy 7.  VT prophylaxis: Heparin   All the records are reviewed and case discussed with Care Management/Social Worker. Management plans discussed with the patient, family and they are in agreement.  CODE STATUS: Full Code  TOTAL TIME TAKING CARE OF THIS PATIENT: 39 minutes.   More than 50% of the time was spent in counseling/coordination of care: YES  POSSIBLE D/C IN 2 DAYS, DEPENDING ON CLINICAL CONDITION.   Jerlyn Pain M.D on 05/28/2018 at 4:22 PM  Between 7am to 6pm - Pager - [phone]  After 6pm go to [email or web address] - Microbiologist  [facility]  Office  [phone]  CC: Primary care physician; [facility]  Note: This dictation was prepared with Dragon dictation along with smaller phrase technology. Any transcriptional errors that result from this process are unintentional.

## 2018-05-28 NOTE — H&P (Signed)
Toni Carlson is an 73 y.o. female.   Chief Complaint: Shortness of breath HPI: The patient with past medical history of end-stage renal disease on dialysis, CHF, A. fib, CAD and hypertension presents to the emergency department complaining of cough and shortness of breath.  The patient states that her cough has started to hurt her throat.  She reports that her throat is so sore that she has difficulty eating.  She has had some chicken broth but not much else in the last week.  In the emergency department the patient was afebrile and was negative for influenza.  BNP was significantly elevated.  Chest x-ray showed some interstitial edema with possible superimposed infiltrate.  The patient was given ceftriaxone and azithromycin prior to the emergency department staff calling the hospitalist service for admission.  Past Medical History:  Diagnosis Date  . A-fib (HCC)    a. on warfarin  . Anemia   . Carotid artery stenosis    a. ultrasound 03/2015: RICA 25-85% stenosis, LICA less than 27% stenosis  . Chronic diastolic CHF (congestive heart failure) ([place])    a. echo 03/2015: EF 60-65%, normal wall motion, diastolic parameters were c/w restrictive physiology, indicative of decreased LV diastolic compliance and/or increased LA pressure, mild AS, mild MR, left atrium was severely dilated, RA was severely dilated, PASP was severely increased at 90 mm Hg  . Coronary artery disease, non-occlusive    a. cardiac cath 2013  . ESRD (end stage renal disease) ([place])    On Tue-Thur-Sat dialysis  . H/O ischemic right MCA stroke    a. 03/2015; b. residual left-sided facial droop and slurred speech  . Hyperparathyroidism, secondary renal ([place])   . Hypertension   . Nicotine dependence   . Pulmonary HTN ([place])    as above  . Symptomatic bradycardia    a. history of symptomatic bradycardia; b. felt to be 2/2 metabolic abnormalities & required temp wire but no PPM, resolved with HD    Past Surgical History:   Procedure Laterality Date  . A/V FISTULAGRAM Left 10/25/2016   Procedure: A/V Fistulagram;  Surgeon: Katha Cabal, MD;  Location: [place] [facility];  Service: Cardiovascular;  Laterality: Left;  . A/V FISTULAGRAM Left 01/23/2018   Procedure: A/V FISTULAGRAM;  Surgeon: Katha Cabal, MD;  Location: [place] [facility];  Service: Cardiovascular;  Laterality: Left;  . A/V FISTULAGRAM Left 05/15/2018   Procedure: A/V FISTULAGRAM;  Surgeon: Katha Cabal, MD;  Location: [place] [facility];  Service: Cardiovascular;  Laterality: Left;  . COLONOSCOPY WITH PROPOFOL N/A 12/19/2016   Procedure: COLONOSCOPY WITH PROPOFOL;  Surgeon: Jonathon Bellows, MD;  Location: [facility] ENDOSCOPY;  Service: Gastroenterology;  Laterality: N/A;  . ESOPHAGOGASTRODUODENOSCOPY (EGD) WITH PROPOFOL N/A 04/14/2015   Procedure: ESOPHAGOGASTRODUODENOSCOPY (EGD) WITH PROPOFOL;  Surgeon: Lucilla Lame, MD;  Location: [facility] ENDOSCOPY;  Service: Endoscopy;  Laterality: N/A;  . ESOPHAGOGASTRODUODENOSCOPY (EGD) WITH PROPOFOL N/A 07/17/2015   Procedure: ESOPHAGOGASTRODUODENOSCOPY (EGD) WITH PROPOFOL;  Surgeon: Josefine Class, MD;  Location: [facility] ENDOSCOPY;  Service: Endoscopy;  Laterality: N/A;  . ESOPHAGOGASTRODUODENOSCOPY (EGD) WITH PROPOFOL N/A 12/19/2016   Procedure: ESOPHAGOGASTRODUODENOSCOPY (EGD) WITH PROPOFOL;  Surgeon: Jonathon Bellows, MD;  Location: [facility] ENDOSCOPY;  Service: Gastroenterology;  Laterality: N/A;  . EUS N/A 09/24/2015   Procedure: UPPER ENDOSCOPIC ULTRASOUND (EUS) LINEAR;  Surgeon: Jola Schmidt, MD;  Location: [facility] ENDOSCOPY;  Service: Endoscopy;  Laterality: N/A;  . LOWER EXTREMITY ANGIOGRAPHY Right 06/09/2016   Procedure: Lower Extremity Angiography;  Surgeon: Algernon Huxley,  MD;  Location: [place] [facility];  Service: Cardiovascular;  Laterality: Right;  . PERIPHERAL VASCULAR CATHETERIZATION N/A 02/23/2015   Procedure: A/V Shuntogram/Fistulagram;  Surgeon: Algernon Huxley, MD;  Location: [place] [facility];   Service: Cardiovascular;  Laterality: N/A;  . PERIPHERAL VASCULAR CATHETERIZATION N/A 02/23/2015   Procedure: A/V Shunt Intervention;  Surgeon: Algernon Huxley, MD;  Location: [place] [facility];  Service: Cardiovascular;  Laterality: N/A;  . VISCERAL ANGIOGRAPHY N/A 04/04/2017   Procedure: VISCERAL ANGIOGRAPHY;  Surgeon: Katha Cabal, MD;  Location: [place] [facility];  Service: Cardiovascular;  Laterality: N/A;    Family History  Problem Relation Age of Onset  . CAD Father   . Dementia Mother   . Lupus Sister    Social History:  reports that she quit smoking about 4 years ago. Her smoking use included cigarettes. She has a 7.50 pack-year smoking history. She has never used smokeless tobacco. She reports that she does not drink alcohol or use drugs.  Allergies: No Known Allergies  Prior to Admission medications   Medication Sig Start Date End Date Taking? Authorizing Provider  acetaminophen (TYLENOL) 500 MG tablet Take 500 mg by mouth every 4 (four) hours as needed for mild pain, fever or headache.    Yes [provider]  alum & mag hydroxide-simeth (MINTOX) 200-200-20 MG/5ML suspension Take 30 mLs by mouth 4 (four) times daily as needed for indigestion or heartburn.    Yes [provider]  atorvastatin (LIPITOR) 40 MG tablet Take 1 tablet (40 mg total) by mouth daily. Patient taking differently: Take 40 mg by mouth at bedtime.  09/19/15  Yes Carrie Mew, MD  bisacodyl (DULCOLAX) 5 MG EC tablet Take 5 mg by mouth daily as needed for mild constipation or moderate constipation.   Yes [provider]  calcium acetate (PHOSLO) 667 MG capsule Take 1,334 mg by mouth 3 (three) times daily.   Yes [provider]  calcium carbonate (TUMS - DOSED IN MG ELEMENTAL CALCIUM) 500 MG chewable tablet Chew 1 tablet by mouth 3 (three) times daily as needed.    Yes [provider]  carvedilol (COREG) 6.25 MG tablet Take 1 tablet (6.25 mg total) by mouth 2  (two) times daily with a meal. 09/19/15  Yes Carrie Mew, MD  cetirizine (ZYRTEC) 10 MG tablet Take 10 mg by mouth daily as needed for allergies.    Yes [provider]  clopidogrel (PLAVIX) 75 MG tablet Take 75 mg daily by mouth.   Yes [provider]  escitalopram (LEXAPRO) 10 MG tablet Take 10 mg by mouth daily.   Yes [provider]  gabapentin (NEURONTIN) 300 MG capsule Take 300 mg by mouth at bedtime.   Yes [provider]  guaifenesin (ROBITUSSIN) 100 MG/5ML syrup Take 200 mg by mouth every 6 (six) hours as needed for cough.   Yes [provider]  loperamide (IMODIUM) 2 MG capsule Take 2 mg by mouth as needed for diarrhea or loose stools.   Yes [provider]  loratadine (CLARITIN) 10 MG tablet Take 10 mg by mouth daily.   Yes [provider]  magnesium hydroxide (MILK OF MAGNESIA) 400 MG/5ML suspension Take 30 mLs by mouth at bedtime as needed for mild constipation.   Yes [provider]  mupirocin ointment (BACTROBAN) 2 % Place 1 application into the nose 2 (two) times daily.   Yes [provider]  Neomycin-Bacitracin-Polymyxin (TRIPLE ANTIBIOTIC) [phone] OINT Apply 1 application topically  3 times/day as needed-between meals & bedtime (for wound care).    Yes [provider]  ondansetron (ZOFRAN) 4 MG tablet Take 4 mg by mouth every 6 (six) hours as needed for nausea or vomiting.    Yes [provider]  pantoprazole (PROTONIX) 40 MG tablet Take 1 tablet (40 mg total) by mouth 2 (two) times daily. 09/19/15  Yes Carrie Mew, MD  polyethylene glycol [facility] / Floria Raveling) packet Take 17 g by mouth daily as needed for mild constipation. 09/19/15  Yes Carrie Mew, MD  senna-docusate (SENOKOT-S) 8.6-50 MG tablet Take 1 tablet by mouth at bedtime as needed for mild constipation.   Yes [provider]  spironolactone (ALDACTONE) 25 MG tablet Take 1 tablet (25 mg total) by mouth  daily. 12/20/16  Yes Fritzi Mandes, MD  traZODone (DESYREL) 50 MG tablet Take 50 mg by mouth at bedtime.  05/18/16  Yes [provider]  trolamine salicylate (ASPERCREME) 10 % cream Apply topically 2 (two) times daily as needed for muscle pain. 09/19/15  Yes Carrie Mew, MD     Results for orders placed or performed during the hospital encounter of 05/28/18 (from the past 48 hour(s))  CBC     Status: Abnormal   Collection Time: 05/28/18  1:12 AM  Result Value Ref Range   WBC 10.5 4.0 - 10.5 K/uL   RBC 2.97 (L) 3.87 - 5.11 MIL/uL   Hemoglobin 8.8 (L) 12.0 - 15.0 g/dL   HCT 29.6 (L) 36.0 - 46.0 %   MCV 99.7 80.0 - 100.0 fL   MCH 29.6 26.0 - 34.0 pg   MCHC 29.7 (L) 30.0 - 36.0 g/dL   RDW 16.8 (H) 11.5 - 15.5 %   Platelets 156 150 - 400 K/uL   nRBC 0.0 0.0 - 0.2 %    Comment: Performed at [facility], [address] [place], [place] [address]  Comprehensive metabolic panel     Status: Abnormal   Collection Time: 05/28/18  1:12 AM  Result Value Ref Range   Sodium 139 135 - 145 mmol/L   Potassium 4.1 3.5 - 5.1 mmol/L   Chloride 98 98 - 111 mmol/L   CO2 28 22 - 32 mmol/L   Glucose, Bld 105 (H) 70 - 99 mg/dL   BUN 36 (H) 8 - 23 mg/dL   Creatinine, Ser 8.81 (H) 0.44 - 1.00 mg/dL   Calcium 8.5 (L) 8.9 - 10.3 mg/dL   Total Protein 7.4 6.5 - 8.1 g/dL   Albumin 3.9 3.5 - 5.0 g/dL   AST 26 15 - 41 U/L   ALT 16 0 - 44 U/L   Alkaline Phosphatase 95 38 - 126 U/L   Total Bilirubin 0.8 0.3 - 1.2 mg/dL   GFR calc non Af Amer 4 (L) >60 mL/min   GFR calc Af Amer 5 (L) >60 mL/min   Anion gap 13 5 - 15    Comment: Performed at [facility], [address] [place], [place] [address]  Influenza panel by PCR (type A & B)     Status: None   Collection Time: 05/28/18  1:12 AM  Result Value Ref Range   Influenza A By PCR NEGATIVE NEGATIVE   Influenza B By PCR NEGATIVE NEGATIVE    Comment: (NOTE) The Xpert Xpress Flu assay is intended as an aid in the diagnosis of   influenza and should not be used as a sole basis for treatment.  This  assay is FDA approved for  nasopharyngeal swab specimens only. Nasal  washings and aspirates are unacceptable for Xpert Xpress Flu testing. Performed at [facility], [address] [place], [place] [address]   Brain natriuretic peptide     Status: Abnormal   Collection Time: 05/28/18  1:12 AM  Result Value Ref Range   B Natriuretic Peptide 1,652.0 (H) 0.0 - 100.0 pg/mL    Comment: Performed at [facility], [address] [place], [place] [address]  Troponin I - Add-On to previous collection     Status: Abnormal   Collection Time: 05/28/18  1:12 AM  Result Value Ref Range   Troponin I 0.06 (HH) <0.03 ng/mL    Comment: CRITICAL RESULT CALLED TO, READ BACK BY AND VERIFIED WITH MICHELE MORTON ON 05/28/18 AT 0155 JAG Performed at [facility], [address] [place], [place] [address]   Lipase, blood     Status: None   Collection Time: 05/28/18  1:12 AM  Result Value Ref Range   Lipase 18 11 - 51 U/L    Comment: Performed at [facility], [address] [place], [place] [address]  Group A Strep by PCR     Status: None   Collection Time: 05/28/18  1:24 AM  Result Value Ref Range   Group A Strep by PCR NOT DETECTED NOT DETECTED    Comment: Performed at [facility], [address] [place], [place] [address]  Lactic acid, plasma     Status: None   Collection Time: 05/28/18  3:46 AM  Result Value Ref Range   Lactic Acid, Venous 0.6 0.5 - 1.9 mmol/L    Comment: Performed at [facility], [address] [place], [place] [address]  Procalcitonin     Status: None   Collection Time: 05/28/18  3:46 AM  Result Value Ref Range   Procalcitonin 0.33 ng/mL    Comment:        Interpretation: PCT (Procalcitonin) <= 0.5 ng/mL: Systemic infection (sepsis) is not likely. Local bacterial infection is possible. (NOTE)       Sepsis PCT Algorithm           Lower  Respiratory Tract                                      Infection PCT Algorithm    ----------------------------     ----------------------------         PCT < 0.25 ng/mL                PCT < 0.10 ng/mL         Strongly encourage             Strongly discourage   discontinuation of antibiotics    initiation of antibiotics    ----------------------------     -----------------------------       PCT 0.25 - 0.50 ng/mL            PCT 0.10 - 0.25 ng/mL               OR       >80% decrease in PCT            Discourage initiation of  antibiotics      Encourage discontinuation           of antibiotics    ----------------------------     -----------------------------         PCT >= 0.50 ng/mL              PCT 0.26 - 0.50 ng/mL               AND        <80% decrease in PCT             Encourage initiation of                                             antibiotics       Encourage continuation           of antibiotics    ----------------------------     -----------------------------        PCT >= 0.50 ng/mL                  PCT > 0.50 ng/mL               AND         increase in PCT                  Strongly encourage                                      initiation of antibiotics    Strongly encourage escalation           of antibiotics                                     -----------------------------                                           PCT <= 0.25 ng/mL                                                 OR                                        > 80% decrease in PCT                                     Discontinue / Do not initiate                                             antibiotics Performed at [facility], [address], [place], [place] [address]   Protime-INR     Status: None   Collection Time: 05/28/18  3:46 AM  Result Value Ref Range   Prothrombin Time 14.1 11.4 - 15.2 seconds   INR 1.10     Comment: Performed at [facility], [address] [place], [place] [address]  Blood Culture (routine x 2)     Status: None (Preliminary result)   Collection Time: 05/28/18  3:46 AM  Result Value Ref Range   Specimen Description BLOOD RIGHT HAND    Special Requests      BOTTLES DRAWN AEROBIC AND ANAEROBIC Blood Culture adequate volume   Culture      NO GROWTH < 12 HOURS Performed at [facility], [address] [place], [place] [address]    Report Status PENDING    Dg Chest 2 View  Result Date: 05/28/2018 CLINICAL DATA:  Cough, congestion, hypoxia EXAM: CHEST - 2 VIEW COMPARISON:  05/16/2018 FINDINGS: Cardiomegaly with increased interstitial markings, favoring mild interstitial edema. No definite pleural effusions. Mild right basilar opacity favors platelike atelectasis, less likely mild infection. No pneumothorax. Thoracic aortic atherosclerosis. Left sided vascular stent. IMPRESSION: Cardiomegaly with increased interstitial markings, favoring mild interstitial edema. Mild right basilar opacity favors atelectasis, less likely mild infection. Electronically Signed   By: Julian Hy M.D.   On: 05/28/2018 02:10   Ct Chest Wo Contrast  Result Date: 05/28/2018 CLINICAL DATA:  Cough and congestion for 2 weeks. Sore throat. Decreased oxygen saturation. Dialysis patient. EXAM: CT CHEST WITHOUT CONTRAST TECHNIQUE: Multidetector CT imaging of the chest was performed following the standard protocol without IV contrast. COMPARISON:  None. FINDINGS: Cardiovascular: Cardiac enlargement. Coronary artery calcification. Mitral and aortic valve calcification. No pericardial effusions. Diffuse calcification of the aorta. Normal caliber thoracic aorta. Mediastinum/Nodes: Esophagus is decompressed. Thyroid gland is unremarkable. Diffusely scattered mediastinal lymph nodes without pathologic enlargement, likely reactive. Lungs/Pleura: Focal consolidation in the superior segment left lower lung likely represents  pneumonia. Atelectasis in both lung bases. No pleural effusions. No pneumothorax. Airways are patent. Upper Abdomen: Kidneys are atrophic and calcified bilaterally consistent with history of end-stage renal disease. Prominent vascular calcifications in the upper abdomen. 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 IMPRESSION: 1. Focal consolidation in the superior segment left lower lung likely represents pneumonia. Atelectasis in both lung bases. 2. Atrophic and calcified kidneys consistent with history of end-stage renal disease. 3. 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 4. Diffuse cardiac enlargement. Aortic Atherosclerosis (ICD10-I70.0). Electronically Signed   By: Lucienne Capers M.D.   On: 05/28/2018 03:00    Review of Systems  Constitutional: Negative for chills and fever.  HENT: Positive for sore throat. Negative for tinnitus.   Eyes: Negative for blurred vision and redness.  Respiratory: Positive for cough and shortness of breath.   Cardiovascular: Negative for chest pain, palpitations, orthopnea and PND.  Gastrointestinal: Negative for abdominal pain, diarrhea, nausea and vomiting.  Genitourinary: Negative for dysuria, frequency and urgency.  Musculoskeletal: Negative for joint pain and myalgias.  Skin: Negative for rash.       No lesions  Neurological: Negative for speech change, focal weakness and weakness.  Endo/Heme/Allergies: Does not bruise/bleed easily.       No temperature intolerance  Psychiatric/Behavioral: Negative for depression and suicidal ideas.    Blood pressure (!) 113/53, pulse (!) 59, temperature 99.1 F (37.3 C), temperature source Oral, resp. rate 16, height 5\' 7"  (1.702 m), weight 68 kg, SpO2 96 %. Physical Exam  Vitals reviewed. Constitutional: She is oriented to person, place, and time. She appears well-developed and well-nourished. No distress.  HENT:  Head: Normocephalic and atraumatic.   Mouth/Throat: Oropharynx is clear and moist.  Eyes: Pupils are equal, round, and reactive to light. Conjunctivae and EOM are normal. No scleral icterus.  Neck: Normal range of motion. Neck supple. No JVD present. No tracheal deviation present. No thyromegaly present.  Cardiovascular: Normal rate, regular rhythm and normal heart sounds. Exam reveals no gallop and no friction rub.  No murmur heard. Respiratory: Effort normal and breath sounds normal.  GI: Soft. Bowel sounds are normal. She exhibits no distension. There is no abdominal tenderness.  Genitourinary:    Genitourinary Comments: Deferred   Musculoskeletal: Normal range of motion.        General: No edema.  Lymphadenopathy:    She has no cervical adenopathy.  Neurological: She is alert and oriented to person, place, and time. No cranial nerve deficit. She exhibits normal muscle tone.  Skin: Skin is warm and dry. No rash noted. No erythema.  Psychiatric: She has a normal mood and affect. Her behavior is normal. Judgment and thought content normal.     Assessment/Plan This is a 72 year old female admitted for pneumonia. 1.  Pneumonia: Healthcare associated; will switch antibiotics to Levaquin and vancomycin. 2.  ESRD: On dialysis; the patient is fluid overloaded at this time.  Consult nephrology for continuation of dialysis.  Continue PhosLo 3.  Hypertension: Controlled; continue Coreg  4.  CHF: Chronic; diastolic.  Continue spironolactone. 5.  CAD: Stable; continue Plavix 6.  Hyperlipidemia: Continue statin therapy 7.  VT prophylaxis: Heparin 8.  GI prophylaxis: Pantoprazole per home regimen The patient is a full code.  Time spent on admission orders and patient care approximately 45 minutes  Harrie Foreman, MD 05/28/2018, 7:07 AM

## 2018-05-28 NOTE — Progress Notes (Signed)
Pharmacy Antibiotic Note  Toni Carlson is a 73 y.o. female admitted on 05/28/2018 with pneumonia.  Pharmacy has been consulted for cefepime and vancomcyin dosing. On dialysis TTS.   Plan: Will give a loading dose of vancomycin 1500 mg x 1 and will give vancomycin 750 mg post HD days. Plan to obtain trough prior to the 4th dose. Goal trough is 15-20 mcg/mL.   Will start cefepime 1g q24H.     Height: 5\' 7"  (170.2 cm) Weight: 150 lb (68 kg) IBW/kg (Calculated) : 61.6  Temp (24hrs), Avg:99.1 F (37.3 C), Min:99.1 F (37.3 C), Max:99.1 F (37.3 C)  Recent Labs  Lab 05/28/18 0112 05/28/18 0346  WBC 10.5  --   CREATININE 8.81*  --   LATICACIDVEN  --  0.6    Estimated Creatinine Clearance: 5.8 mL/min (A) (by C-G formula based on SCr of 8.81 mg/dL (H)).    No Known Allergies  Antimicrobials this admission: 1/13 vancomcyin >>  1/13 cefepime >>  1/13 ceftriaxone + azithro x 1   Dose adjustments this admission: Pt on dialysis TTS  Microbiology results: 1/13 BCx: pending  1/13 MRSA PCR: ordered  Thank you for allowing pharmacy to be a part of this patient's care.  Oswald Hillock, PharmD, BCPS Clinical Pharmacist 05/28/2018 8:32 AM

## 2018-05-28 NOTE — ED Notes (Signed)
Patient transported to CT 

## 2018-05-28 NOTE — ED Notes (Signed)
Date and time results received: 05/28/18 01:56 (use smartphrase ".now" to insert current time)  Test: troponin Critical Value: 0.06  Name of Provider Notified: Dr. Karma Greaser  Orders Received? Or Actions Taken?: acknowledged

## 2018-05-28 NOTE — Progress Notes (Signed)
Pt. Resting comfortably , tx progressing well.   05/28/18 1600  Vital Signs  Pulse Rate 85  Pulse Rate Source Monitor  Resp 15  BP (!) 149/54  BP Location Right Arm  During Hemodialysis Assessment  Blood Flow Rate (mL/min) 400 mL/min  Arterial Pressure (mmHg) -140 mmHg  Venous Pressure (mmHg) 180 mmHg  Transmembrane Pressure (mmHg) 70 mmHg  Ultrafiltration Rate (mL/min) 990 mL/min  Dialysate Flow Rate (mL/min) 600 ml/min  Conductivity: Machine  13.8  HD Safety Checks Performed Yes  Intra-Hemodialysis Comments Progressing as prescribed (2223 mls)

## 2018-05-28 NOTE — ED Provider Notes (Signed)
[facility] Emergency Department Provider Note  ____________________________________________   First MD Initiated Contact with Patient 05/28/18 0125     (approximate)  I have reviewed the triage vital signs and the nursing notes.   HISTORY  Chief Complaint Cough; Shortness of Breath; and Sore Throat    HPI Toni Carlson is a 73 y.o. female with extensive chronic medical history as listed below who presents for evaluation of worsening cough over the last week.  She has associated shortness of breath particular with the cough but she does have some shortness of breath at rest as well.  She says she got sick about a week ago and it is steadily gotten worse.  Initially she had some subjective fever but that has gone away.  She says that she has had no nausea nor vomiting, but she has spit up a few times when she has been coughing.  She has not had much of an appetite recently.  She denies abdominal pain and vomiting as well as diarrhea.  She has been compliant with her dialysis and last had dialysis on Friday (this is late Sunday night, early Monday morning).  Exertion makes her symptoms worse and nothing in particular makes them better.  She describes it as severe.  Past Medical History:  Diagnosis Date  . A-fib (HCC)    a. on warfarin  . Anemia   . Carotid artery stenosis    a. ultrasound 03/2015: RICA 34-28% stenosis, LICA less than 76% stenosis  . Chronic diastolic CHF (congestive heart failure) ([place])    a. echo 03/2015: EF 60-65%, normal wall motion, diastolic parameters were c/w restrictive physiology, indicative of decreased LV diastolic compliance and/or increased LA pressure, mild AS, mild MR, left atrium was severely dilated, RA was severely dilated, PASP was severely increased at 90 mm Hg  . Coronary artery disease, non-occlusive    a. cardiac cath 2013  . ESRD (end stage renal disease) ([place])    On Tue-Thur-Sat dialysis  . H/O ischemic right MCA  stroke    a. 03/2015; b. residual left-sided facial droop and slurred speech  . Hyperparathyroidism, secondary renal ([place])   . Hypertension   . Nicotine dependence   . Pulmonary HTN ([place])    as above  . Symptomatic bradycardia    a. history of symptomatic bradycardia; b. felt to be 2/2 metabolic abnormalities & required temp wire but no PPM, resolved with HD    Patient Active Problem List   Diagnosis Date Noted  . HCAP (healthcare-associated pneumonia) 05/28/2018  . Acute respiratory failure with hypoxia ([place]) 05/16/2018  . Complication of AV dialysis fistula 04/17/2017  . 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 Hyperlipidemia 03/16/2017  . Complication from renal dialysis device 11/27/2016  . Gastric polyp 08/18/2015  . Anemia 07/15/2015  . GI bleed 07/15/2015  . Polyneuropathy 07/08/2015  . Degenerative disc disease, lumbar 07/08/2015  . Pancytopenia ([place]) 07/08/2015  . Generalized weakness 07/08/2015  . End stage renal disease on dialysis ([place]) 07/08/2015  . Atherosclerotic peripheral vascular disease ([place]) 07/08/2015  . Pain in both feet 07/02/2015  . Gastric ulceration   . Acute GI bleeding   . GIB (gastrointestinal bleeding) 04/12/2015  . Left-sided weakness 04/01/2015  . Essential hypertension 04/01/2015  . Anemia of chronic disease 04/01/2015  . Elevated troponin 04/01/2015  . Pulmonary hypertension ([place]) 04/01/2015  . Atrial fibrillation ([place])   . CVA (cerebral infarction) 03/26/2015    Past Surgical History:  Procedure Laterality Date  .  A/V FISTULAGRAM Left 10/25/2016   Procedure: A/V Fistulagram;  Surgeon: Katha Cabal, MD;  Location: [place] [facility];  Service: Cardiovascular;  Laterality: Left;  . A/V FISTULAGRAM Left 01/23/2018   Procedure: A/V FISTULAGRAM;  Surgeon: Katha Cabal, MD;  Location: [place] [facility];  Service: Cardiovascular;  Laterality: Left;  . A/V FISTULAGRAM Left 05/15/2018   Procedure: A/V FISTULAGRAM;   Surgeon: Katha Cabal, MD;  Location: [place] [facility];  Service: Cardiovascular;  Laterality: Left;  . COLONOSCOPY WITH PROPOFOL N/A 12/19/2016   Procedure: COLONOSCOPY WITH PROPOFOL;  Surgeon: Jonathon Bellows, MD;  Location: [facility] ENDOSCOPY;  Service: Gastroenterology;  Laterality: N/A;  . ESOPHAGOGASTRODUODENOSCOPY (EGD) WITH PROPOFOL N/A 04/14/2015   Procedure: ESOPHAGOGASTRODUODENOSCOPY (EGD) WITH PROPOFOL;  Surgeon: Lucilla Lame, MD;  Location: [facility] ENDOSCOPY;  Service: Endoscopy;  Laterality: N/A;  . ESOPHAGOGASTRODUODENOSCOPY (EGD) WITH PROPOFOL N/A 07/17/2015   Procedure: ESOPHAGOGASTRODUODENOSCOPY (EGD) WITH PROPOFOL;  Surgeon: Josefine Class, MD;  Location: [facility] ENDOSCOPY;  Service: Endoscopy;  Laterality: N/A;  . ESOPHAGOGASTRODUODENOSCOPY (EGD) WITH PROPOFOL N/A 12/19/2016   Procedure: ESOPHAGOGASTRODUODENOSCOPY (EGD) WITH PROPOFOL;  Surgeon: Jonathon Bellows, MD;  Location: [facility] ENDOSCOPY;  Service: Gastroenterology;  Laterality: N/A;  . EUS N/A 09/24/2015   Procedure: UPPER ENDOSCOPIC ULTRASOUND (EUS) LINEAR;  Surgeon: Jola Schmidt, MD;  Location: [facility] ENDOSCOPY;  Service: Endoscopy;  Laterality: N/A;  . LOWER EXTREMITY ANGIOGRAPHY Right 06/09/2016   Procedure: Lower Extremity Angiography;  Surgeon: Algernon Huxley, MD;  Location: [place] [facility];  Service: Cardiovascular;  Laterality: Right;  . PERIPHERAL VASCULAR CATHETERIZATION N/A 02/23/2015   Procedure: A/V Shuntogram/Fistulagram;  Surgeon: Algernon Huxley, MD;  Location: [place] [facility];  Service: Cardiovascular;  Laterality: N/A;  . PERIPHERAL VASCULAR CATHETERIZATION N/A 02/23/2015   Procedure: A/V Shunt Intervention;  Surgeon: Algernon Huxley, MD;  Location: [place] [facility];  Service: Cardiovascular;  Laterality: N/A;  . VISCERAL ANGIOGRAPHY N/A 04/04/2017   Procedure: VISCERAL ANGIOGRAPHY;  Surgeon: Katha Cabal, MD;  Location: [place] [facility];  Service: Cardiovascular;  Laterality: N/A;    Prior to Admission  medications   Medication Sig Start Date End Date Taking? Authorizing Provider  acetaminophen (TYLENOL) 500 MG tablet Take 500 mg by mouth every 4 (four) hours as needed for mild pain, fever or headache.    Yes [provider]  alum & mag hydroxide-simeth (MINTOX) 200-200-20 MG/5ML suspension Take 30 mLs by mouth 4 (four) times daily as needed for indigestion or heartburn.    Yes [provider]  atorvastatin (LIPITOR) 40 MG tablet Take 1 tablet (40 mg total) by mouth daily. Patient taking differently: Take 40 mg by mouth at bedtime.  09/19/15  Yes Carrie Mew, MD  bisacodyl (DULCOLAX) 5 MG EC tablet Take 5 mg by mouth daily as needed for mild constipation or moderate constipation.   Yes [provider]  calcium acetate (PHOSLO) 667 MG capsule Take 1,334 mg by mouth 3 (three) times daily.   Yes [provider]  calcium carbonate (TUMS - DOSED IN MG ELEMENTAL CALCIUM) 500 MG chewable tablet Chew 1 tablet by mouth 3 (three) times daily as needed.    Yes [provider]  carvedilol (COREG) 6.25 MG tablet Take 1 tablet (6.25 mg total) by mouth 2 (two) times daily with a meal. 09/19/15  Yes Carrie Mew, MD  cetirizine (ZYRTEC) 10 MG tablet Take 10 mg by mouth daily as needed for allergies.    Yes [provider]  clopidogrel (PLAVIX)  75 MG tablet Take 75 mg daily by mouth.   Yes [provider]  escitalopram (LEXAPRO) 10 MG tablet Take 10 mg by mouth daily.   Yes [provider]  gabapentin (NEURONTIN) 300 MG capsule Take 300 mg by mouth at bedtime.   Yes [provider]  guaifenesin (ROBITUSSIN) 100 MG/5ML syrup Take 200 mg by mouth every 6 (six) hours as needed for cough.   Yes [provider]  loperamide (IMODIUM) 2 MG capsule Take 2 mg by mouth as needed for diarrhea or loose stools.   Yes [provider]  loratadine (CLARITIN) 10 MG tablet Take 10 mg by mouth daily.   Yes [provider]   magnesium hydroxide (MILK OF MAGNESIA) 400 MG/5ML suspension Take 30 mLs by mouth at bedtime as needed for mild constipation.   Yes [provider]  mupirocin ointment (BACTROBAN) 2 % Place 1 application into the nose 2 (two) times daily.   Yes [provider]  Neomycin-Bacitracin-Polymyxin (TRIPLE ANTIBIOTIC) [phone] OINT Apply 1 application topically 3 times/day as needed-between meals & bedtime (for wound care).    Yes [provider]  ondansetron (ZOFRAN) 4 MG tablet Take 4 mg by mouth every 6 (six) hours as needed for nausea or vomiting.    Yes [provider]  pantoprazole (PROTONIX) 40 MG tablet Take 1 tablet (40 mg total) by mouth 2 (two) times daily. 09/19/15  Yes Carrie Mew, MD  polyethylene glycol [facility] / Floria Raveling) packet Take 17 g by mouth daily as needed for mild constipation. 09/19/15  Yes Carrie Mew, MD  senna-docusate (SENOKOT-S) 8.6-50 MG tablet Take 1 tablet by mouth at bedtime as needed for mild constipation.   Yes [provider]  spironolactone (ALDACTONE) 25 MG tablet Take 1 tablet (25 mg total) by mouth daily. 12/20/16  Yes Fritzi Mandes, MD  traZODone (DESYREL) 50 MG tablet Take 50 mg by mouth at bedtime.  05/18/16  Yes [provider]  trolamine salicylate (ASPERCREME) 10 % cream Apply topically 2 (two) times daily as needed for muscle pain. 09/19/15  Yes Carrie Mew, MD    Allergies Patient has no known allergies.  Family History  Problem Relation Age of Onset  . CAD Father   . Dementia Mother   . Lupus Sister     Social History Social History   Tobacco Use  . Smoking status: Former Smoker    Packs/day: 0.25    Years: 30.00    Pack years: 7.50    Types: Cigarettes    Last attempt to quit: 11/20/2013    Years since quitting: 4.5  . Smokeless tobacco: Never Used  Substance Use Topics  . Alcohol use: No    Alcohol/week: 0.0 standard drinks  . Drug use: No    Review of  Systems Constitutional: Fever about a week ago, now resolved.  General malaise. Eyes: No visual changes. ENT: +sore throat. Cardiovascular: Denies chest pain. Respiratory: Severe cough with associated shortness of breath. Gastrointestinal: No abdominal pain.  No nausea, no vomiting.  No diarrhea.  No constipation. Genitourinary: Negative for dysuria. Musculoskeletal: Negative for neck pain.  Negative for back pain. Integumentary: Negative for rash. Neurological: Negative for headaches, focal weakness or numbness.   ____________________________________________   PHYSICAL EXAM:  VITAL SIGNS: ED Triage Vitals  Enc Vitals Group     BP 05/28/18 0101 133/78     Pulse Rate 05/28/18 0101 (!) 10     Resp 05/28/18 0101 (!) 22  Temp 05/28/18 0101 99.1 F (37.3 C)     Temp Source 05/28/18 0101 Oral     SpO2 05/28/18 0101 90 %     Weight 05/28/18 0102 68 kg (150 lb)     Height 05/28/18 0102 1.702 m (5\' 7" )     Head Circumference --      Peak Flow --      Pain Score 05/28/18 0102 7     Pain Loc --      Pain Edu? --      Excl. in [place]? --     Constitutional: Alert and oriented.  Appears chronically ill with acute viral symptoms. Eyes: Conjunctivae are normal.  Head: Atraumatic. Nose: No congestion/rhinnorhea. Mouth/Throat: Mucous membranes are moist. Neck: No stridor.  No meningeal signs.   Cardiovascular: Normal rate, regular rhythm. Good peripheral circulation. Grossly normal heart sounds. Respiratory: Thick and wet sounding cough.  There is some crackles and coarse breath sounds in the bases but no wheezing and generally good air movement. Gastrointestinal: Soft and nontender. No distention.  Musculoskeletal: No lower extremity tenderness nor edema. No gross deformities of extremities. Neurologic:  Normal speech and language. No gross focal neurologic deficits are appreciated.  Skin:  Skin is warm, dry and intact. No rash noted. Psychiatric: Mood and affect are normal. Speech  and behavior are normal.  ____________________________________________   LABS (all labs ordered are listed, but only abnormal results are displayed)  Labs Reviewed  CBC - Abnormal; Notable for the following components:      Result Value   RBC 2.97 (*)    Hemoglobin 8.8 (*)    HCT 29.6 (*)    MCHC 29.7 (*)    RDW 16.8 (*)    All other components within normal limits  COMPREHENSIVE METABOLIC PANEL - Abnormal; Notable for the following components:   Glucose, Bld 105 (*)    BUN 36 (*)    Creatinine, Ser 8.81 (*)    Calcium 8.5 (*)    GFR calc non Af Amer 4 (*)    GFR calc Af Amer 5 (*)    All other components within normal limits  BRAIN NATRIURETIC PEPTIDE - Abnormal; Notable for the following components:   B Natriuretic Peptide 1,652.0 (*)    All other components within normal limits  TROPONIN I - Abnormal; Notable for the following components:   Troponin I 0.06 (*)    All other components within normal limits  GROUP A STREP BY PCR  CULTURE, BLOOD (ROUTINE X 2)  CULTURE, BLOOD (ROUTINE X 2)  URINE CULTURE  INFLUENZA PANEL BY PCR (TYPE A & B)  LIPASE, BLOOD  LACTIC ACID, PLASMA  PROCALCITONIN  PROTIME-INR  LACTIC ACID, PLASMA  URINALYSIS, COMPLETE (UACMP) WITH MICROSCOPIC   ____________________________________________  EKG  ED ECG REPORT #1 I, Hinda Kehr, the attending physician, personally viewed and interpreted this ECG.  Date: 05/28/2018 EKG Time: 1:11 AM Rate: 80 Rhythm: Normal sinus rhythm with PVCs QRS Axis: Left axis deviation Intervals: normal Narrative Interpretation: Heavy artifact is present making interpretation very difficult.  A repeat EKG will be obtained.  ED ECG REPORT #2 I, Hinda Kehr, the attending physician, personally viewed and interpreted this ECG.  Date: 05/28/2018 EKG Time: 3:44 AM Rate: 79 Rhythm: Atrial flutter QRS Axis: normal Intervals: normal ST/T Wave abnormalities: Non-specific ST segment / T-wave changes, but no clear  evidence of acute ischemia. Narrative Interpretation: no definitive evidence of acute ischemia; does not meet STEMI criteria.   ____________________________________________  RADIOLOGY  I, Hinda Kehr, personally viewed and evaluated these images (plain radiographs) as part of my medical decision making, as well as reviewing the written report by the radiologist.  ED MD interpretation: Questionable focal consolidation that could be atelectasis or infiltrate on the chest x-ray.  CT scan of the chest reveals pneumonia in the left lower lobe.  Official radiology report(s): Dg Chest 2 View  Result Date: 05/28/2018 CLINICAL DATA:  Cough, congestion, hypoxia EXAM: CHEST - 2 VIEW COMPARISON:  05/16/2018 FINDINGS: Cardiomegaly with increased interstitial markings, favoring mild interstitial edema. No definite pleural effusions. Mild right basilar opacity favors platelike atelectasis, less likely mild infection. No pneumothorax. Thoracic aortic atherosclerosis. Left sided vascular stent. IMPRESSION: Cardiomegaly with increased interstitial markings, favoring mild interstitial edema. Mild right basilar opacity favors atelectasis, less likely mild infection. Electronically Signed   By: Julian Hy M.D.   On: 05/28/2018 02:10   Ct Chest Wo Contrast  Result Date: 05/28/2018 CLINICAL DATA:  Cough and congestion for 2 weeks. Sore throat. Decreased oxygen saturation. Dialysis patient. EXAM: CT CHEST WITHOUT CONTRAST TECHNIQUE: Multidetector CT imaging of the chest was performed following the standard protocol without IV contrast. COMPARISON:  None. FINDINGS: Cardiovascular: Cardiac enlargement. Coronary artery calcification. Mitral and aortic valve calcification. No pericardial effusions. Diffuse calcification of the aorta. Normal caliber thoracic aorta. Mediastinum/Nodes: Esophagus is decompressed. Thyroid gland is unremarkable. Diffusely scattered mediastinal lymph nodes without pathologic enlargement,  likely reactive. Lungs/Pleura: Focal consolidation in the superior segment left lower lung likely represents pneumonia. Atelectasis in both lung bases. No pleural effusions. No pneumothorax. Airways are patent. Upper Abdomen: Kidneys are atrophic and calcified bilaterally consistent with history of end-stage renal disease. Prominent vascular calcifications in the upper abdomen. 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 IMPRESSION: 1. Focal consolidation in the superior segment left lower lung likely represents pneumonia. Atelectasis in both lung bases. 2. Atrophic and calcified kidneys consistent with history of end-stage renal disease. 3. 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 4. Diffuse cardiac enlargement. Aortic Atherosclerosis (ICD10-I70.0). Electronically Signed   By: Lucienne Capers M.D.   On: 05/28/2018 03:00    ____________________________________________   PROCEDURES  Critical Care performed: No   Procedure(s) performed:   Procedures   ____________________________________________   INITIAL IMPRESSION / ASSESSMENT AND PLAN / ED COURSE  As part of my medical decision making, I reviewed the following data within the [facility] notes reviewed and incorporated, Labs reviewed , EKG interpreted , Old chart reviewed, Radiograph reviewed , Discussed with admitting physician  and Notes from prior ED visits    Differential diagnosis includes, but is not limited to, community-acquired pneumonia, viral illness including influenza, CHF exacerbation, COPD exacerbation, ACS, PE, pneumothorax.  The patient's signs and symptoms are most consistent with a viral respiratory illness that has developed into a community-acquired pneumonia.  The chest x-ray was questionable so I obtained a CT scan of the chest without contrast for further evaluation and it did reveal an area in the left lower lobe consistent with  pneumonia.  She technically does not meet sepsis criteria but I have performed the standard sepsis work-up.  I am holding off on fluids because she is due for dialysis in a few hours and she appears to be having a mild CHF exacerbation in addition to the community-acquired pneumonia and I do not want to give her extra fluids.  I am giving ceftriaxone 2 g IV and azithromycin 500 mg IV.  I am also giving her a  dose of cough medicine by mouth.  I discussed the case with Dr. Marcille Blanco with the hospitalist service at 3:45am and he will admit and contact Nephrology for dialysis later this morning.     ____________________________________________  FINAL CLINICAL IMPRESSION(S) / ED DIAGNOSES  Final diagnoses:  Viral URI with cough  Community acquired pneumonia of left lower lobe of lung ([place])  ESRD on hemodialysis ([place])  Acute on chronic congestive heart failure, unspecified heart failure type ([place])     MEDICATIONS GIVEN DURING THIS VISIT:  Medications  cefTRIAXone (ROCEPHIN) 2 g in sodium chloride 0.9 % 100 mL IVPB (0 g Intravenous Stopped 05/28/18 0433)  azithromycin (ZITHROMAX) 500 mg in sodium chloride 0.9 % 250 mL IVPB (500 mg Intravenous New Bag/Given 05/28/18 0435)  chlorpheniramine-HYDROcodone (TUSSIONEX) 10-8 MG/5ML suspension 5 mL (5 mLs Oral Given 05/28/18 0348)     ED Discharge Orders    None       Note:  This document was prepared using Dragon voice recognition software and may include unintentional dictation errors.    Hinda Kehr, MD 05/28/18 [phone]

## 2018-05-28 NOTE — Progress Notes (Signed)
Vancomycin running, pt denies any c/o, goal adjusted based on vancomycin volume (increaased by 548mls).   05/28/18 1500  Vital Signs  Pulse Rate Source Monitor  BP 135/79  BP Location Right Arm  During Hemodialysis Assessment  Blood Flow Rate (mL/min) 400 mL/min  Arterial Pressure (mmHg) -130 mmHg  Venous Pressure (mmHg) 180 mmHg  Transmembrane Pressure (mmHg) 70 mmHg  Ultrafiltration Rate (mL/min) 990 mL/min  Dialysate Flow Rate (mL/min) 600 ml/min  Conductivity: Machine  13.9  HD Safety Checks Performed Yes  Intra-Hemodialysis Comments Progressing as prescribed (1521 mls)

## 2018-05-28 NOTE — Progress Notes (Signed)
[facility]  ROUNDING NOTE   Subjective:  Patient well known to [place]. Comes in with shortness of breath.   Did have dialysis on Monday. Due for HD again today.  BP was a bit low in ED. Has been having nausea and vomiting at home as well.    Objective:  Vital signs in last 24 hours:  Temp:  [97.7 F (36.5 C)-99.1 F (37.3 C)] 97.7 F (36.5 C) (01/13 1315) Pulse Rate:  [10-92] 92 (01/13 1445) Resp:  [13-24] 21 (01/13 1445) BP: (94-152)/(53-114) 132/62 (01/13 1445) SpO2:  [90 %-100 %] 96 % (01/13 1315) Weight:  [68 kg] 68 kg (01/13 0102)  Weight change:  Filed Weights   05/28/18 0102  Weight: 68 kg    Intake/Output: I/O last 3 completed shifts: In: 350 [IV Piggyback:350] Out: -    Intake/Output this shift:  No intake/output data recorded.  Physical Exam: General: No acute distress  Head: Normocephalic, atraumatic. Moist oral mucosal membranes  Eyes: Anicteric  Neck: Supple, trachea midline  Lungs:  Left basilar rales  Heart: S1S2 no rubs  Abdomen:  Soft, nontender, bowel sounds present  Extremities: trace peripheral edema.  Neurologic: Awake, alert, following commands  Skin: No lesions  Access: LUE AVF    Basic Metabolic Panel: Recent Labs  Lab 05/28/18 0112  NA 139  K 4.1  CL 98  CO2 28  GLUCOSE 105*  BUN 36*  CREATININE 8.81*  CALCIUM 8.5*    Liver Function Tests: Recent Labs  Lab 05/28/18 0112  AST 26  ALT 16  ALKPHOS 95  BILITOT 0.8  PROT 7.4  ALBUMIN 3.9   Recent Labs  Lab 05/28/18 0112  LIPASE 18   No results for input(s): AMMONIA in the last 168 hours.  CBC: Recent Labs  Lab 05/28/18 0112  WBC 10.5  HGB 8.8*  HCT 29.6*  MCV 99.7  PLT 156    Cardiac Enzymes: Recent Labs  Lab 05/28/18 0112  TROPONINI 0.06*    BNP: Invalid input(s): POCBNP  CBG: No results for input(s): GLUCAP in the last 168 hours.  Microbiology: Results for orders placed or performed during the hospital encounter of 05/28/18   Group A Strep by PCR     Status: None   Collection Time: 05/28/18  1:24 AM  Result Value Ref Range Status   Group A Strep by PCR NOT DETECTED NOT DETECTED Final    Comment: Performed at [facility], [address] [place], [place] [address]  Blood Culture (routine x 2)     Status: None (Preliminary result)   Collection Time: 05/28/18  3:46 AM  Result Value Ref Range Status   Specimen Description BLOOD RIGHT HAND  Final   Special Requests   Final    BOTTLES DRAWN AEROBIC AND ANAEROBIC Blood Culture adequate volume   Culture   Final    NO GROWTH < 12 HOURS Performed at [facility], [address], [place], [place] [address]    Report Status PENDING  Incomplete    Coagulation Studies: Recent Labs    05/28/18 0346  LABPROT 14.1  INR 1.10    Urinalysis: No results for input(s): COLORURINE, LABSPEC, PHURINE, GLUCOSEU, HGBUR, BILIRUBINUR, KETONESUR, PROTEINUR, UROBILINOGEN, NITRITE, LEUKOCYTESUR in the last 72 hours.  Invalid input(s): APPERANCEUR    Imaging: Dg Chest 2 View  Result Date: 05/28/2018 CLINICAL DATA:  Cough, congestion, hypoxia EXAM: CHEST - 2 VIEW COMPARISON:  05/16/2018 FINDINGS: Cardiomegaly with increased interstitial markings, favoring mild interstitial edema.  No definite pleural effusions. Mild right basilar opacity favors platelike atelectasis, less likely mild infection. No pneumothorax. Thoracic aortic atherosclerosis. Left sided vascular stent. IMPRESSION: Cardiomegaly with increased interstitial markings, favoring mild interstitial edema. Mild right basilar opacity favors atelectasis, less likely mild infection. Electronically Signed   By: Julian Hy M.D.   On: 05/28/2018 02:10   Ct Chest Wo Contrast  Result Date: 05/28/2018 CLINICAL DATA:  Cough and congestion for 2 weeks. Sore throat. Decreased oxygen saturation. Dialysis patient. EXAM: CT CHEST WITHOUT CONTRAST TECHNIQUE: Multidetector CT imaging of the chest was performed  following the standard protocol without IV contrast. COMPARISON:  None. FINDINGS: Cardiovascular: Cardiac enlargement. Coronary artery calcification. Mitral and aortic valve calcification. No pericardial effusions. Diffuse calcification of the aorta. Normal caliber thoracic aorta. Mediastinum/Nodes: Esophagus is decompressed. Thyroid gland is unremarkable. Diffusely scattered mediastinal lymph nodes without pathologic enlargement, likely reactive. Lungs/Pleura: Focal consolidation in the superior segment left lower lung likely represents pneumonia. Atelectasis in both lung bases. No pleural effusions. No pneumothorax. Airways are patent. Upper Abdomen: Kidneys are atrophic and calcified bilaterally consistent with history of end-stage renal disease. Prominent vascular calcifications in the upper abdomen. 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 IMPRESSION: 1. Focal consolidation in the superior segment left lower lung likely represents pneumonia. Atelectasis in both lung bases. 2. Atrophic and calcified kidneys consistent with history of end-stage renal disease. 3. 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 4. Diffuse cardiac enlargement. Aortic Atherosclerosis (ICD10-I70.0). Electronically Signed   By: Lucienne Capers M.D.   On: 05/28/2018 03:00     Medications:   . sodium chloride    . sodium chloride    . ceFEPime (MAXIPIME) IV 1 g (05/28/18 1016)  . vancomycin 1,500 mg (05/28/18 1507)  . [START ON 05/29/2018] vancomycin     . atorvastatin  40 mg Oral QHS  . calcium acetate  1,334 mg Oral TID  . carvedilol  6.25 mg Oral BID WC  . Chlorhexidine Gluconate Cloth  6 each Topical Q0600  . clopidogrel  75 mg Oral Daily  . docusate sodium  100 mg Oral BID  . escitalopram  10 mg Oral Daily  . gabapentin  300 mg Oral QHS  . heparin  5,000 Units Subcutaneous Q8H  . loratadine  10 mg Oral Daily  . mupirocin ointment  1 application Nasal BID   . pantoprazole  40 mg Oral BID  . spironolactone  25 mg Oral Daily  . traZODone  50 mg Oral QHS   sodium chloride, sodium chloride, acetaminophen **OR** acetaminophen, alteplase, alum & mag hydroxide-simeth, bisacodyl, calcium carbonate, guaifenesin, heparin, lidocaine (PF), lidocaine-prilocaine, loperamide, ondansetron **OR** ondansetron (ZOFRAN) IV, pentafluoroprop-tetrafluoroeth, polyethylene glycol, TRIPLE ANTIBIOTIC, trolamine salicylate  Assessment/ Plan:  73 y.o. female with end stage renal disease on hemodialysis, hypertension, coronary artery disease, CVA, pulmonary hypertension, carotid artery stenosis, colon mass, atrial fibrillation, depression, hyperlipidemia  [facility] [address] Left AVF 74.5kg.   1. End stage renal disease: patient due for hemodialysis today.  Ultrafiltration 22 kg.  2. Anemia of chronic kidney disease: hhemoglobin down to 8.8.  Start the patient on Epogen 10,000 units IV with dialysis.  3. Secondary Hyperparathyroidism: check PTH and phosphorus with dialysis.  Otherwise maintain the patient on calcium acetate.  4. Hypertension:   Maintain the patient on carvedilol.  5.  Bacterial pneumonia.  Patient started on vancomycin.   LOS: 0 Gilda Abboud 1/13/20203:11 PM

## 2018-05-28 NOTE — Progress Notes (Signed)
   05/28/18 1300  Neurological  Level of Consciousness Alert  Orientation Level Oriented X4  Respiratory  Respiratory Pattern Regular;Unlabored  Chest Assessment Chest expansion symmetrical  R Upper  Breath Sounds Rhonchi  L Upper Breath Sounds Rhonchi  R Lower Breath Sounds Diminished  L Lower Breath Sounds Diminished  Cardiac  Pulse Irregular  Heart Sounds S1, S2  Cardiac Rhythm Atrial fibrillation  Vascular  R Radial Pulse +2  L Radial Pulse +2  R Dorsalis Pedis Pulse +1  L Dorsalis Pedis Pulse +1  Psychosocial  Psychosocial (WDL) WDL   Pre dialysis assessment

## 2018-05-29 LAB — CBC
HCT: 27.4 % — ABNORMAL LOW (ref 36.0–46.0)
Hemoglobin: 8.2 g/dL — ABNORMAL LOW (ref 12.0–15.0)
MCH: 29.7 pg (ref 26.0–34.0)
MCHC: 29.9 g/dL — ABNORMAL LOW (ref 30.0–36.0)
MCV: 99.3 fL (ref 80.0–100.0)
PLATELETS: 135 10*3/uL — AB (ref 150–400)
RBC: 2.76 MIL/uL — AB (ref 3.87–5.11)
RDW: 16.5 % — ABNORMAL HIGH (ref 11.5–15.5)
WBC: 8.8 10*3/uL (ref 4.0–10.5)
nRBC: 0 % (ref 0.0–0.2)

## 2018-05-29 LAB — HEPATITIS B SURFACE ANTIGEN: Hepatitis B Surface Ag: NEGATIVE

## 2018-05-29 LAB — HEPATITIS B SURFACE ANTIBODY, QUANTITATIVE

## 2018-05-29 LAB — BASIC METABOLIC PANEL
Anion gap: 11 (ref 5–15)
BUN: 23 mg/dL (ref 8–23)
CALCIUM: 8.3 mg/dL — AB (ref 8.9–10.3)
CO2: 28 mmol/L (ref 22–32)
Chloride: 98 mmol/L (ref 98–111)
Creatinine, Ser: 5.52 mg/dL — ABNORMAL HIGH (ref 0.44–1.00)
GFR calc Af Amer: 8 mL/min — ABNORMAL LOW (ref >60)
GFR, EST NON AFRICAN AMERICAN: 7 mL/min — AB (ref >60)
Glucose, Bld: 95 mg/dL (ref 70–99)
Potassium: 3.6 mmol/L (ref 3.5–5.1)
Sodium: 137 mmol/L (ref 135–145)

## 2018-05-29 LAB — PHOSPHORUS: Phosphorus: 3 mg/dL (ref 2.5–4.6)

## 2018-05-29 LAB — MAGNESIUM: Magnesium: 1.5 mg/dL — ABNORMAL LOW (ref 1.7–2.4)

## 2018-05-29 LAB — PARATHYROID HORMONE, INTACT (NO CA): PTH: 358 pg/mL — ABNORMAL HIGH (ref 15–65)

## 2018-05-29 MED ORDER — HYDROCOD POLST-CPM POLST ER 10-8 MG/5ML PO SUER
5.0000 mL | Freq: Two times a day (BID) | ORAL | Status: DC | PRN
  Administered 2018-05-29 – 2018-05-30 (×2): 5 mL via ORAL
  Filled 2018-05-29 (×2): qty 5

## 2018-05-29 MED ORDER — BENZONATATE 100 MG PO CAPS
100.0000 mg | ORAL_CAPSULE | Freq: Three times a day (TID) | ORAL | Status: DC | PRN
  Administered 2018-05-29: 100 mg via ORAL
  Filled 2018-05-29: qty 1

## 2018-05-29 MED ORDER — VANCOMYCIN HCL IN DEXTROSE 750-5 MG/150ML-% IV SOLN
750.0000 mg | INTRAVENOUS | Status: DC
  Administered 2018-05-30: 750 mg via INTRAVENOUS
  Filled 2018-05-29: qty 150

## 2018-05-29 MED ORDER — MAGNESIUM OXIDE 400 (241.3 MG) MG PO TABS
800.0000 mg | ORAL_TABLET | Freq: Once | ORAL | Status: AC
  Administered 2018-05-29: 800 mg via ORAL
  Filled 2018-05-29: qty 2

## 2018-05-29 MED ORDER — EPOETIN ALFA 10000 UNIT/ML IJ SOLN: 10000.0000 [IU] | INTRAMUSCULAR | Status: DC

## 2018-05-29 NOTE — Plan of Care (Signed)
  Problem: Clinical Measurements: Goal: Ability to maintain clinical measurements within normal limits will improve Outcome: Progressing Goal: Will remain free from infection Outcome: Progressing Goal: Diagnostic test results will improve Outcome: Progressing Goal: Respiratory complications will improve Outcome: Progressing Goal: Cardiovascular complication will be avoided Outcome: Progressing   Problem: Elimination: Goal: Will not experience complications related to bowel motility Outcome: Progressing Goal: Will not experience complications related to urinary retention Outcome: Progressing   Problem: Pain Managment: Goal: General experience of comfort will improve Outcome: Progressing   Problem: Fluid Volume: Goal: Compliance with measures to maintain balanced fluid volume will improve Outcome: Progressing  Patient compliant with fluid restrictions

## 2018-05-29 NOTE — Clinical Social Work Note (Signed)
Clinical Social Work Assessment  Patient Details  Name: Toni Carlson MRN: [id number] Date of Birth: 1945-06-19  Date of referral:  05/29/18               Reason for consult:  Discharge Planning                Permission sought to share information with:  Facility Art therapist granted to share information::  Yes, Verbal Permission Granted  Name::        Agency::     Relationship::     Contact Information:     Housing/Transportation Living arrangements for the past 2 months:  [place] of Information:  Patient Patient Interpreter Needed:  None Criminal Activity/Legal Involvement Pertinent to Current Situation/Hospitalization:  No - Comment as needed Significant Relationships:  Adult Children Lives with:  Facility Resident Do you feel safe going back to the place where you live?  Yes Need for family participation in patient care:  No (Coment)  Care giving concerns:  Patient resides at [place].   Social Worker assessment / plan:  CSW met with patient this afternoon and she confirmed she resides at [place]. She states she has been there for about 5/6 months. She states she is adjusting well. Patient wishes to return to [facility] if possible.   CSW attempted to call [facility] but was put on hold for about 10 minutes and no one picked up. CSW will attempt again tomorrow.  Employment status:    Insurance information:    PT Recommendations:    Information / Referral to community resources:     Patient/Family's Response to care:  Patient expressed appreciation for CSW assistance.  Patient/Family's Understanding of and Emotional Response to Diagnosis, Current Treatment, and Prognosis:  Patient is hopeful she will feel better soon.  Emotional Assessment Appearance:  Appears stated age Attitude/Demeanor/Rapport:  (pleasant and cooperative) Affect (typically observed):  Pleasant, Calm,  Appropriate Orientation:  Oriented to Self, Oriented to Place, Oriented to Situation Alcohol / Substance use:  Not Applicable Psych involvement (Current and /or in the community):  No (Comment)  Discharge Needs  Concerns to be addressed:  Care Coordination Readmission within the last 30 days:  No Current discharge risk:  None Barriers to Discharge:  No Barriers Identified   Shela Leff, LCSW 05/29/2018, 6:43 PM

## 2018-05-29 NOTE — Progress Notes (Signed)
[facility]  ROUNDING NOTE   Subjective:  Patient seen at bedside. Had hemodialysis yesterday. Still has a bit of a cough.   Objective:  Vital signs in last 24 hours:  Temp:  [98.1 F (36.7 C)-99.1 F (37.3 C)] 98.1 F (36.7 C) (01/14 1148) Pulse Rate:  [70-111] 84 (01/14 1148) Resp:  [15-23] 20 (01/14 0325) BP: (98-155)/(45-79) 98/59 (01/14 1148) SpO2:  [94 %-97 %] 94 % (01/14 1148)  Weight change:  Filed Weights   05/28/18 0102  Weight: 68 kg    Intake/Output: I/O last 3 completed shifts: In: 25 [P.O.:240; IV Piggyback:350] Out: 0    Intake/Output this shift:  No intake/output data recorded.  Physical Exam: General: No acute distress  Head: Normocephalic, atraumatic. Moist oral mucosal membranes  Eyes: Anicteric  Neck: Supple, trachea midline  Lungs:  Left basilar rales  Heart: S1S2 no rubs  Abdomen:  Soft, nontender, bowel sounds present  Extremities: trace peripheral edema.  Neurologic: Awake, alert, following commands  Skin: No lesions  Access: LUE AVF    Basic Metabolic Panel: Recent Labs  Lab 05/28/18 0112 05/28/18 1633 05/29/18 0615  NA 139  --  137  K 4.1  --  3.6  CL 98  --  98  CO2 28  --  28  GLUCOSE 105*  --  95  BUN 36*  --  23  CREATININE 8.81*  --  5.52*  CALCIUM 8.5*  --  8.3*  MG  --   --  1.5*  PHOS  --  1.7* 3.0    Liver Function Tests: Recent Labs  Lab 05/28/18 0112  AST 26  ALT 16  ALKPHOS 95  BILITOT 0.8  PROT 7.4  ALBUMIN 3.9   Recent Labs  Lab 05/28/18 0112  LIPASE 18   No results for input(s): AMMONIA in the last 168 hours.  CBC: Recent Labs  Lab 05/28/18 0112 05/29/18 0615  WBC 10.5 8.8  HGB 8.8* 8.2*  HCT 29.6* 27.4*  MCV 99.7 99.3  PLT 156 135*    Cardiac Enzymes: Recent Labs  Lab 05/28/18 0112  TROPONINI 0.06*    BNP: Invalid input(s): POCBNP  CBG: No results for input(s): GLUCAP in the last 168 hours.  Microbiology: Results for orders placed or performed during  the hospital encounter of 05/28/18  Group A Strep by PCR     Status: None   Collection Time: 05/28/18  1:24 AM  Result Value Ref Range Status   Group A Strep by PCR NOT DETECTED NOT DETECTED Final    Comment: Performed at [facility], [address] [place], [place] [address]  Blood Culture (routine x 2)     Status: None (Preliminary result)   Collection Time: 05/28/18  3:46 AM  Result Value Ref Range Status   Specimen Description BLOOD RIGHT HAND  Final   Special Requests   Final    BOTTLES DRAWN AEROBIC AND ANAEROBIC Blood Culture adequate volume   Culture   Final    NO GROWTH 1 DAY Performed at [facility], [address], [place],  [address]    Report Status PENDING  Incomplete  Blood Culture (routine x 2)     Status: None (Preliminary result)   Collection Time: 05/28/18  3:47 AM  Result Value Ref Range Status   Specimen Description BLOOD RIGHT ARM  Final   Special Requests   Final    BOTTLES DRAWN AEROBIC AND ANAEROBIC Blood Culture results may not be optimal  due to an excessive volume of blood received in culture bottles   Culture   Final    NO GROWTH < 24 HOURS Performed at [facility], [address] [place], [place] [address]    Report Status PENDING  Incomplete    Coagulation Studies: Recent Labs    05/28/18 0346  LABPROT 14.1  INR 1.10    Urinalysis: No results for input(s): COLORURINE, LABSPEC, PHURINE, GLUCOSEU, HGBUR, BILIRUBINUR, KETONESUR, PROTEINUR, UROBILINOGEN, NITRITE, LEUKOCYTESUR in the last 72 hours.  Invalid input(s): APPERANCEUR    Imaging: Dg Chest 2 View  Result Date: 05/28/2018 CLINICAL DATA:  Cough, congestion, hypoxia EXAM: CHEST - 2 VIEW COMPARISON:  05/16/2018 FINDINGS: Cardiomegaly with increased interstitial markings, favoring mild interstitial edema. No definite pleural effusions. Mild right basilar opacity favors platelike atelectasis, less likely mild infection. No pneumothorax. Thoracic  aortic atherosclerosis. Left sided vascular stent. IMPRESSION: Cardiomegaly with increased interstitial markings, favoring mild interstitial edema. Mild right basilar opacity favors atelectasis, less likely mild infection. Electronically Signed   By: Julian Hy M.D.   On: 05/28/2018 02:10   Ct Chest Wo Contrast  Result Date: 05/28/2018 CLINICAL DATA:  Cough and congestion for 2 weeks. Sore throat. Decreased oxygen saturation. Dialysis patient. EXAM: CT CHEST WITHOUT CONTRAST TECHNIQUE: Multidetector CT imaging of the chest was performed following the standard protocol without IV contrast. COMPARISON:  None. FINDINGS: Cardiovascular: Cardiac enlargement. Coronary artery calcification. Mitral and aortic valve calcification. No pericardial effusions. Diffuse calcification of the aorta. Normal caliber thoracic aorta. Mediastinum/Nodes: Esophagus is decompressed. Thyroid gland is unremarkable. Diffusely scattered mediastinal lymph nodes without pathologic enlargement, likely reactive. Lungs/Pleura: Focal consolidation in the superior segment left lower lung likely represents pneumonia. Atelectasis in both lung bases. No pleural effusions. No pneumothorax. Airways are patent. Upper Abdomen: Kidneys are atrophic and calcified bilaterally consistent with history of end-stage renal disease. Prominent vascular calcifications in the upper abdomen. 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 IMPRESSION: 1. Focal consolidation in the superior segment left lower lung likely represents pneumonia. Atelectasis in both lung bases. 2. Atrophic and calcified kidneys consistent with history of end-stage renal disease. 3. 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 4. Diffuse cardiac enlargement. Aortic Atherosclerosis (ICD10-I70.0). Electronically Signed   By: Lucienne Capers M.D.   On: 05/28/2018 03:00     Medications:   . ceFEPime (MAXIPIME) IV 1 g (05/29/18  1240)  . vancomycin     . atorvastatin  40 mg Oral QHS  . calcium acetate  1,334 mg Oral TID WC  . carvedilol  6.25 mg Oral BID WC  . Chlorhexidine Gluconate Cloth  6 each Topical Q0600  . clopidogrel  75 mg Oral Daily  . docusate sodium  100 mg Oral BID  . escitalopram  10 mg Oral Daily  . gabapentin  300 mg Oral QHS  . heparin  5,000 Units Subcutaneous Q8H  . loratadine  10 mg Oral Daily  . mupirocin ointment  1 application Nasal BID  . pantoprazole  40 mg Oral BID  . spironolactone  25 mg Oral Daily  . traZODone  50 mg Oral QHS   acetaminophen **OR** acetaminophen, alum & mag hydroxide-simeth, bisacodyl, calcium carbonate, chlorpheniramine-HYDROcodone, guaiFENesin, loperamide, ondansetron **OR** ondansetron (ZOFRAN) IV, polyethylene glycol, TRIPLE ANTIBIOTIC, trolamine salicylate  Assessment/ Plan:  73 y.o. female with end stage renal disease on hemodialysis, hypertension, coronary artery disease, CVA, pulmonary hypertension, carotid artery stenosis, colon mass, atrial fibrillation, depression, hyperlipidemia  [facility] [address] Left AVF 74.5kg.  1. End stage renal disease: patient completed hemodialysis yesterday.  No acute indication for dialysis today.  We will plan for dialysis again tomorrow.  2. Anemia of chronic kidney disease: Epogen 10000 units IV with HD.   3. Secondary Hyperparathyroidism: phosphorus 3.0 and at target.  Continue the patient on calcium acetate.  4. Hypertension:   Maintain the patient on carvedilol.  5.  Bacterial pneumonia.  Continue cefepime and vancomycin.  LOS: 1 Toni Carlson 1/14/20202:15 PM

## 2018-05-29 NOTE — Progress Notes (Signed)
[place] [facility] [place] NAME: Toni Carlson    MR#:  [id number]  DATE OF BIRTH:  [birth year]  SUBJECTIVE:  CHIEF COMPLAINT:   Chief Complaint  Patient presents with  . Cough  . Shortness of Breath  . Sore Throat   Patient complaining of persistent cough despite being on Robitussin as well as Tessalon.  Change to Tussionex which patient claims has helped her better in the past.  No fevers.  Had hemodialysis yesterday.  REVIEW OF SYSTEMS:  Review of Systems  Constitutional: Negative for chills and fever.  HENT: Negative for hearing loss and tinnitus.   Eyes: Negative for blurred vision and double vision.  Respiratory: Positive for cough and shortness of breath. Negative for hemoptysis and sputum production.   Cardiovascular: Negative for chest pain and palpitations.  Gastrointestinal: Negative for heartburn.  Genitourinary: Negative for dysuria and urgency.  Musculoskeletal: Negative for myalgias and neck pain.  Neurological: Negative for dizziness and headaches.  Psychiatric/Behavioral: Negative for depression and hallucinations.      DRUG ALLERGIES:  No Known Allergies VITALS:  Blood pressure (!) 98/59, pulse 84, temperature 98.1 F (36.7 C), resp. rate 20, height 5\' 7"  (1.702 m), weight 68 kg, SpO2 94 %. PHYSICAL EXAMINATION:   Physical Exam  Constitutional: She is oriented to person, place, and time and well-developed, well-nourished, and in no distress.  HENT:  Head: Normocephalic and atraumatic.  Eyes: Pupils are equal, round, and reactive to light. EOM are normal.  Neck: Normal range of motion. Neck supple.  Cardiovascular: Normal rate and regular rhythm.  Pulmonary/Chest: Effort normal. She has rales.  Abdominal: Soft. Bowel sounds are normal. She exhibits no distension.  Neurological: She is alert and oriented to person, place, and time.  Skin: Skin is warm. She is not diaphoretic.  Psychiatric: Affect and judgment normal.     LABORATORY PANEL:  Female CBC Recent Labs  Lab 05/29/18 0615  WBC 8.8  HGB 8.2*  HCT 27.4*  PLT 135*   ------------------------------------------------------------------------------------------------------------------ Chemistries  Recent Labs  Lab 05/28/18 0112 05/29/18 0615  NA 139 137  K 4.1 3.6  CL 98 98  CO2 28 28  GLUCOSE 105* 95  BUN 36* 23  CREATININE 8.81* 5.52*  CALCIUM 8.5* 8.3*  MG  --  1.5*  AST 26  --   ALT 16  --   ALKPHOS 95  --   BILITOT 0.8  --    RADIOLOGY:  No results found. ASSESSMENT AND PLAN:    This is a 73 year old female admitted for pneumonia.  1.  Pneumonia: Healthcare associated;  Initially placed on IV Levaquin and vancomycin.  Levofloxacin subsequently discontinued and patient placed on IV cefepime for adequate gram-negative coverage.  Patient still symptomatic with significant cough.  Continue IV antibiotics for now.  Reevaluate and make a decision on de-escalation in a.m.  Follow-up on cultures. Influenza test was negative  2.  ESRD: On dialysis; the patient is fluid overloaded at this time. Nephrology service consulted for inpatient hemodialysis  3.  Hypertension: Controlled; continue Coreg   4.  CHF: Chronic; diastolic.  Continue spironolactone.  5.  CAD: Stable; continue Plavix  6.  Hyperlipidemia: Continue statin therapy 7.  VT prophylaxis: Heparin   All the records are reviewed and case discussed with Care Management/Social Worker. Management plans discussed with the patient, family and they are in agreement.  CODE STATUS: Full Code  TOTAL TIME TAKING CARE OF THIS PATIENT: 39 minutes.  More than 50% of the time was spent in counseling/coordination of care: YES  POSSIBLE D/C IN 1- 2 DAYS, DEPENDING ON CLINICAL CONDITION.   Toni Carlson M.D on 05/29/2018 at 1:38 PM  Between 7am to 6pm - Pager - [phone]  After 6pm go to [email or web address] - Proofreader  [facility]  Office   [phone]  CC: Primary care physician; [facility]  Note: This dictation was prepared with Dragon dictation along with smaller phrase technology. Any transcriptional errors that result from this process are unintentional.

## 2018-05-30 LAB — CBC
HCT: 28.3 % — ABNORMAL LOW (ref 36.0–46.0)
Hemoglobin: 8.5 g/dL — ABNORMAL LOW (ref 12.0–15.0)
MCH: 29.3 pg (ref 26.0–34.0)
MCHC: 30 g/dL (ref 30.0–36.0)
MCV: 97.6 fL (ref 80.0–100.0)
Platelets: 127 10*3/uL — ABNORMAL LOW (ref 150–400)
RBC: 2.9 MIL/uL — ABNORMAL LOW (ref 3.87–5.11)
RDW: 16.2 % — ABNORMAL HIGH (ref 11.5–15.5)
WBC: 5.9 10*3/uL (ref 4.0–10.5)
nRBC: 0 % (ref 0.0–0.2)

## 2018-05-30 LAB — BASIC METABOLIC PANEL
Anion gap: 12 (ref 5–15)
BUN: 38 mg/dL — ABNORMAL HIGH (ref 8–23)
CO2: 28 mmol/L (ref 22–32)
Calcium: 8.2 mg/dL — ABNORMAL LOW (ref 8.9–10.3)
Chloride: 97 mmol/L — ABNORMAL LOW (ref 98–111)
Creatinine, Ser: 7.36 mg/dL — ABNORMAL HIGH (ref 0.44–1.00)
GFR calc Af Amer: 6 mL/min — ABNORMAL LOW (ref >60)
GFR calc non Af Amer: 5 mL/min — ABNORMAL LOW (ref >60)
GLUCOSE: 85 mg/dL (ref 70–99)
POTASSIUM: 3.7 mmol/L (ref 3.5–5.1)
Sodium: 137 mmol/L (ref 135–145)

## 2018-05-30 LAB — PHOSPHORUS
Phosphorus: 4.6 mg/dL (ref 2.5–4.6)
Phosphorus: 4.6 mg/dL (ref 2.5–4.6)

## 2018-05-30 LAB — MAGNESIUM: Magnesium: 1.9 mg/dL (ref 1.7–2.4)

## 2018-05-30 MED ORDER — SODIUM CHLORIDE 0.9 % IV SOLN
INTRAVENOUS | Status: DC | PRN
  Administered 2018-05-31: 250 mL via INTRAVENOUS

## 2018-05-30 NOTE — Progress Notes (Signed)
HD Tx completed    05/30/18 1200  Vital Signs  Pulse Rate 89  Pulse Rate Source Monitor  Resp 16  BP (!) 80/43  BP Location Right Arm  BP Method Automatic  Patient Position (if appropriate) Lying  Oxygen Therapy  SpO2 93 %  Pulse Oximetry Type Continuous  During Hemodialysis Assessment  Blood Flow Rate (mL/min) 400 mL/min  Arterial Pressure (mmHg) -180 mmHg  Venous Pressure (mmHg) 170 mmHg  Transmembrane Pressure (mmHg) 60 mmHg  Ultrafiltration Rate (mL/min) 700 mL/min  Dialysate Flow Rate (mL/min) 800 ml/min  Conductivity: Machine  14.3  HD Safety Checks Performed Yes  KECN 67.3 KECN  Dialysis Fluid Bolus Normal Saline  Bolus Amount (mL) 250 mL  Intra-Hemodialysis Comments Tx completed;Tolerated well (MD ok to end tx, pt request)

## 2018-05-30 NOTE — Progress Notes (Signed)
[place] [facility] [place] NAME: Toni Carlson    MR#:  [id number]  DATE OF BIRTH:  May 13, 1946  SUBJECTIVE:  CHIEF COMPLAINT:   Chief Complaint  Patient presents with  . Cough  . Shortness of Breath  . Sore Throat   Patient reports some improvement in cough.  Slept a little better last night.  No fevers overnight.  Having hemodialysis today.   REVIEW OF SYSTEMS:  Review of Systems  Constitutional: Negative for chills and fever.  HENT: Negative for hearing loss and tinnitus.   Eyes: Negative for blurred vision and double vision.  Respiratory: Positive for cough and shortness of breath. Negative for hemoptysis and sputum production.   Cardiovascular: Negative for chest pain and palpitations.  Gastrointestinal: Negative for heartburn.  Genitourinary: Negative for dysuria and urgency.  Musculoskeletal: Negative for myalgias and neck pain.  Neurological: Negative for dizziness and headaches.  Psychiatric/Behavioral: Negative for depression and hallucinations.      DRUG ALLERGIES:  No Known Allergies VITALS:  Blood pressure 101/74, pulse 89, temperature 98.4 F (36.9 C), temperature source Oral, resp. rate 16, height 5\' 7"  (1.702 m), weight 77.5 kg, SpO2 91 %. PHYSICAL EXAMINATION:   Physical Exam  Constitutional: She is oriented to person, place, and time and well-developed, well-nourished, and in no distress.  HENT:  Head: Normocephalic and atraumatic.  Eyes: Pupils are equal, round, and reactive to light. EOM are normal.  Neck: Normal range of motion. Neck supple.  Cardiovascular: Normal rate and regular rhythm.  Pulmonary/Chest: Effort normal. She has rales.  Abdominal: Soft. Bowel sounds are normal. She exhibits no distension.  Neurological: She is alert and oriented to person, place, and time.  Skin: Skin is warm. She is not diaphoretic.  Psychiatric: Affect and judgment normal.   LABORATORY PANEL:  Female CBC Recent Labs  Lab  05/30/18 0404  WBC 5.9  HGB 8.5*  HCT 28.3*  PLT 127*   ------------------------------------------------------------------------------------------------------------------ Chemistries  Recent Labs  Lab 05/28/18 0112  05/30/18 0404  NA 139   < > 137  K 4.1   < > 3.7  CL 98   < > 97*  CO2 28   < > 28  GLUCOSE 105*   < > 85  BUN 36*   < > 38*  CREATININE 8.81*   < > 7.36*  CALCIUM 8.5*   < > 8.2*  MG  --    < > 1.9  AST 26  --   --   ALT 16  --   --   ALKPHOS 95  --   --   BILITOT 0.8  --   --    < > = values in this interval not displayed.   RADIOLOGY:  No results found. ASSESSMENT AND PLAN:    This is a 73 year old female admitted for pneumonia.  1.  Pneumonia: Healthcare associated;  Initially placed on IV Levaquin and vancomycin.  Levofloxacin subsequently discontinued and patient placed on IV cefepime for adequate gram-negative coverage.  Patient still symptomatic with significant cough but gradually improving on current IV antibiotics. Plans to de-escalate antibiotics in a.m.  Pharmacist following.  Influenza test was negative  2.  ESRD: On dialysis; the patient is fluid overloaded at this time. Nephrology service consulted for inpatient hemodialysis  3.  Hypertension: Controlled; continue Coreg   4.  CHF: Chronic; diastolic.  Continue spironolactone.  5.  CAD: Stable; continue Plavix  6.  Hyperlipidemia: Continue statin therapy 7.  VT prophylaxis: Heparin   All the records are reviewed and case discussed with Care Management/Social Worker. Management plans discussed with the patient, family and they are in agreement.  CODE STATUS: Full Code  TOTAL TIME TAKING CARE OF THIS PATIENT: 35 minutes.   More than 50% of the time was spent in counseling/coordination of care: YES  POSSIBLE D/C IN 1- 2 DAYS, DEPENDING ON CLINICAL CONDITION.   Bilal Manzer M.D on 05/30/2018 at 2:00 PM  Between 7am to 6pm - Pager - [phone]  After 6pm go to [email or web address] -  [employer] [facility]  Office  [phone]  CC: Primary care physician; No primary care provider on file.  Note: This dictation was prepared with Dragon dictation along with smaller phrase technology. Any transcriptional errors that result from this process are unintentional.

## 2018-05-30 NOTE — Progress Notes (Signed)
HD Tx started w/o complication   63/84/66 5993  Hand-Off documentation  Report given to (Full Name) Beatris Ship, RN   Report received from (Full Name) Delbert Harness, RN   Vital Signs  Temp 98.2 F (36.8 C)  Temp Source Oral  Pulse Rate 85  Pulse Rate Source Monitor  Resp 19  BP (!) 127/52  BP Location Right Arm  BP Method Automatic  Patient Position (if appropriate) Lying  Oxygen Therapy  SpO2 90 %  O2 Device Room Air  Pulse Oximetry Type Continuous  Pain Assessment  Pain Scale 0-10  Pain Score 0  Dialysis Weight  Weight 78.2 kg  Type of Weight Pre-Dialysis  Time-Out for Hemodialysis  What Procedure? HD  Pt Identifiers(min of two) First/Last Name;MRN/Account#  Correct Site? Yes  Correct Side? Yes  Correct Procedure? Yes  Consents Verified? Yes  Rad Studies Available? N/A  Safety Precautions Reviewed? Yes  Engineer, civil (consulting) Number 2  Station Number 1  UF/Alarm Test Passed  Conductivity: Meter 14  Conductivity: Machine  14  pH 7.4  Reverse Osmosis Main  Normal Saline Lot Number [id number]  Dialyzer Lot Number [id number]  Disposable Set Lot Number [id number]  Machine Temperature 98.6 F (37 C)  Musician and Audible Yes  Blood Lines Intact and Secured Yes  Pre Treatment Patient Checks  Vascular access used during treatment Fistula  Hepatitis B Surface Antigen Results Negative  Date Hepatitis B Surface Antigen Drawn 05/28/18  Hepatitis B Surface Antibody  (>10)  Date Hepatitis B Surface Antibody Drawn 07/05/17  Hemodialysis Consent Verified Yes  Hemodialysis Standing Orders Initiated Yes  ECG (Telemetry) Monitor On Yes  Prime Ordered Normal Saline  Length of  DialysisTreatment -hour(s) 3.5 Hour(s)  Dialysis Treatment Comments Na 140  Dialyzer Elisio 17H NR  Dialysate 2K, 2.5 Ca  Dialysis Anticoagulant None  Dialysate Flow Ordered 800  Blood Flow Rate Ordered 400 mL/min  Ultrafiltration Goal 2 Liters  Pre Treatment Labs Phosphorus  Dialysis  Blood Pressure Support Ordered Normal Saline  During Hemodialysis Assessment  Blood Flow Rate (mL/min) 400 mL/min  Arterial Pressure (mmHg) -160 mmHg  Venous Pressure (mmHg) 170 mmHg  Transmembrane Pressure (mmHg) 60 mmHg  Ultrafiltration Rate (mL/min) 700 mL/min  Dialysate Flow Rate (mL/min) 800 ml/min  Conductivity: Machine  14  HD Safety Checks Performed Yes  Dialysis Fluid Bolus Normal Saline  Bolus Amount (mL) 250 mL  Intra-Hemodialysis Comments Tx initiated  Education / Care Plan  Dialysis Education Provided Yes  Documented Education in Care Plan Yes  Fistula / Graft Left Upper arm  No Placement Date or Time found.   Placed prior to admission: Yes  Orientation: Left  Access Location: Upper arm  Site Condition No complications  Fistula / Graft Assessment Present;Thrill;Bruit  Status Accessed  Needle Size 15g  Drainage Description None

## 2018-05-30 NOTE — Progress Notes (Signed)
[facility]  ROUNDING NOTE   Subjective:  Patient seen and evaluated during hemodialysis.  Tolerating well. Cough improved with medication.   Objective:  Vital signs in last 24 hours:  Temp:  [97.4 F (36.3 C)-98.2 F (36.8 C)] 98.2 F (36.8 C) (01/15 0842) Pulse Rate:  [80-117] 113 (01/15 1115) Resp:  [17-22] 22 (01/15 1115) BP: (92-142)/(28-83) 114/68 (01/15 1115) SpO2:  [83 %-95 %] 88 % (01/15 0945) Weight:  [77 kg-78.2 kg] 78.2 kg (01/15 0842)  Weight change:  Filed Weights   05/28/18 0102 05/30/18 0554 05/30/18 0842  Weight: 68 kg 77 kg 78.2 kg    Intake/Output: I/O last 3 completed shifts: In: 581.7 [P.O.:480; IV Piggyback:101.7] Out: 0    Intake/Output this shift:  No intake/output data recorded.  Physical Exam: General: No acute distress  Head: Normocephalic, atraumatic. Moist oral mucosal membranes  Eyes: Anicteric  Neck: Supple, trachea midline  Lungs:  Left basilar rales  Heart: S1S2 no rubs  Abdomen:  Soft, nontender, bowel sounds present  Extremities: trace peripheral edema.  Neurologic: Awake, alert, following commands  Skin: No lesions  Access: LUE AVF    Basic Metabolic Panel: Recent Labs  Lab 05/28/18 0112 05/28/18 1633 05/29/18 0615 05/30/18 0404 05/30/18 1036  NA 139  --  137 137  --   K 4.1  --  3.6 3.7  --   CL 98  --  98 97*  --   CO2 28  --  28 28  --   GLUCOSE 105*  --  95 85  --   BUN 36*  --  23 38*  --   CREATININE 8.81*  --  5.52* 7.36*  --   CALCIUM 8.5*  --  8.3* 8.2*  --   MG  --   --  1.5* 1.9  --   PHOS  --  1.7* 3.0 4.6 4.6    Liver Function Tests: Recent Labs  Lab 05/28/18 0112  AST 26  ALT 16  ALKPHOS 95  BILITOT 0.8  PROT 7.4  ALBUMIN 3.9   Recent Labs  Lab 05/28/18 0112  LIPASE 18   No results for input(s): AMMONIA in the last 168 hours.  CBC: Recent Labs  Lab 05/28/18 0112 05/29/18 0615 05/30/18 0404  WBC 10.5 8.8 5.9  HGB 8.8* 8.2* 8.5*  HCT 29.6* 27.4* 28.3*  MCV 99.7  99.3 97.6  PLT 156 135* 127*    Cardiac Enzymes: Recent Labs  Lab 05/28/18 0112  TROPONINI 0.06*    BNP: Invalid input(s): POCBNP  CBG: No results for input(s): GLUCAP in the last 168 hours.  Microbiology: Results for orders placed or performed during the hospital encounter of 05/28/18  Group A Strep by PCR     Status: None   Collection Time: 05/28/18  1:24 AM  Result Value Ref Range Status   Group A Strep by PCR NOT DETECTED NOT DETECTED Final    Comment: Performed at [facility], [address] [place], [place] [address]  Blood Culture (routine x 2)     Status: None (Preliminary result)   Collection Time: 05/28/18  3:46 AM  Result Value Ref Range Status   Specimen Description BLOOD RIGHT HAND  Final   Special Requests   Final    BOTTLES DRAWN AEROBIC AND ANAEROBIC Blood Culture adequate volume   Culture   Final    NO GROWTH 2 DAYS Performed at [facility], [address] [place], [place] [address]  Report Status PENDING  Incomplete  Blood Culture (routine x 2)     Status: None (Preliminary result)   Collection Time: 05/28/18  3:47 AM  Result Value Ref Range Status   Specimen Description BLOOD RIGHT ARM  Final   Special Requests   Final    BOTTLES DRAWN AEROBIC AND ANAEROBIC Blood Culture results may not be optimal due to an excessive volume of blood received in culture bottles   Culture   Final    NO GROWTH 2 DAYS Performed at [facility], [address] [place], [place] [address]    Report Status PENDING  Incomplete    Coagulation Studies: Recent Labs    05/28/18 0346  LABPROT 14.1  INR 1.10    Urinalysis: No results for input(s): COLORURINE, LABSPEC, PHURINE, GLUCOSEU, HGBUR, BILIRUBINUR, KETONESUR, PROTEINUR, UROBILINOGEN, NITRITE, LEUKOCYTESUR in the last 72 hours.  Invalid input(s): APPERANCEUR    Imaging: No results found.   Medications:   . ceFEPime (MAXIPIME) IV Stopped (05/29/18 1321)  . vancomycin      . atorvastatin  40 mg Oral QHS  . calcium acetate  1,334 mg Oral TID WC  . carvedilol  6.25 mg Oral BID WC  . Chlorhexidine Gluconate Cloth  6 each Topical Q0600  . clopidogrel  75 mg Oral Daily  . docusate sodium  100 mg Oral BID  . epoetin (EPOGEN/PROCRIT) injection  10,000 Units Intravenous Q M,W,F-HD  . escitalopram  10 mg Oral Daily  . gabapentin  300 mg Oral QHS  . heparin  5,000 Units Subcutaneous Q8H  . loratadine  10 mg Oral Daily  . mupirocin ointment  1 application Nasal BID  . pantoprazole  40 mg Oral BID  . spironolactone  25 mg Oral Daily  . traZODone  50 mg Oral QHS   acetaminophen **OR** acetaminophen, alum & mag hydroxide-simeth, bisacodyl, calcium carbonate, chlorpheniramine-HYDROcodone, guaiFENesin, loperamide, ondansetron **OR** ondansetron (ZOFRAN) IV, polyethylene glycol, TRIPLE ANTIBIOTIC, trolamine salicylate  Assessment/ Plan:  73 y.o. female with end stage renal disease on hemodialysis, hypertension, coronary artery disease, CVA, pulmonary hypertension, carotid artery stenosis, colon mass, atrial fibrillation, depression, hyperlipidemia  [facility] [address] Left AVF 74.5kg.   1. End stage renal disease: patient completed hemodialysis yesterday.  Patient seen and evaluated during hemodialysis.  Tolerating well.  Complete dialysis treatment today.  2. Anemia of chronic kidney disease: Continue Epogen 10,000 units IV with dialysis treatment.  3. Secondary Hyperparathyroidism: Maintain the patient on calcium acetate as prescribed.  4. Hypertension: Continue carvedilol 6.25 mg p.o. twice daily.  5.  Bacterial pneumonia.  Continue cefepime and vancomycin.  LOS: 2 Osei Anger 1/15/202012:20 PM

## 2018-05-30 NOTE — Progress Notes (Signed)
Post HD Assessment , MD ok to end tx per pt request.    05/30/18 1220  Neurological  Level of Consciousness Alert  Orientation Level Oriented X4  Respiratory  Respiratory Pattern Regular  Chest Assessment Chest expansion symmetrical  Bilateral Breath Sounds Clear;Diminished  Cough Productive  Cardiac  Pulse Irregular  ECG Monitor Yes  Cardiac Rhythm Atrial fibrillation  Vascular  R Radial Pulse +2  L Radial Pulse +2  Edema Generalized  Psychosocial  Psychosocial (WDL) WDL

## 2018-05-30 NOTE — Progress Notes (Signed)
Pre HD Assessment    05/30/18 0835  Neurological  Level of Consciousness Alert  Orientation Level Oriented X4  Respiratory  Respiratory Pattern Regular  Chest Assessment Chest expansion symmetrical  Bilateral Breath Sounds Diminished  Cough None  Cardiac  Pulse Irregular  ECG Monitor Yes  Cardiac Rhythm Atrial fibrillation  Vascular  R Radial Pulse +2  L Radial Pulse +2  Edema Generalized  Psychosocial  Psychosocial (WDL) WDL

## 2018-05-30 NOTE — Progress Notes (Signed)
Post HD Tx    05/30/18 1207  Vital Signs  Temp 98.1 F (36.7 C)  Temp Source Oral  Pulse Rate 88  Pulse Rate Source Monitor  Resp 20  BP 96/65  BP Location Right Arm  BP Method Automatic  Patient Position (if appropriate) Lying  Oxygen Therapy  SpO2 95 %  O2 Device Room Air  Pulse Oximetry Type Continuous  Pain Assessment  Pain Scale 0-10  Pain Score 0  Dialysis Weight  Weight 77.5 kg  Type of Weight Post-Dialysis  Post-Hemodialysis Assessment  Rinseback Volume (mL) 250 mL  KECN 67.3 V  Dialyzer Clearance Lightly streaked  Duration of HD Treatment -hour(s) 3.25 hour(s)  Hemodialysis Intake (mL) 500 mL  UF Total -Machine (mL) 2182 mL  Net UF (mL) 1682 mL  Tolerated HD Treatment Yes  AVG/AVF Arterial Site Held (minutes) 10 minutes  AVG/AVF Venous Site Held (minutes) 10 minutes  Fistula / Graft Left Upper arm  No Placement Date or Time found.   Placed prior to admission: Yes  Orientation: Left  Access Location: Upper arm  Site Condition No complications  Fistula / Graft Assessment Present;Thrill;Bruit  Status Deaccessed

## 2018-05-31 ENCOUNTER — Encounter (INDEPENDENT_AMBULATORY_CARE_PROVIDER_SITE_OTHER): Payer: Medicare Other

## 2018-05-31 ENCOUNTER — Ambulatory Visit (INDEPENDENT_AMBULATORY_CARE_PROVIDER_SITE_OTHER): Payer: Medicare Other | Admitting: Nurse Practitioner

## 2018-05-31 LAB — BASIC METABOLIC PANEL
Anion gap: 14 (ref 5–15)
BUN: 24 mg/dL — ABNORMAL HIGH (ref 8–23)
CHLORIDE: 95 mmol/L — AB (ref 98–111)
CO2: 29 mmol/L (ref 22–32)
CREATININE: 5.27 mg/dL — AB (ref 0.44–1.00)
Calcium: 8.2 mg/dL — ABNORMAL LOW (ref 8.9–10.3)
GFR calc Af Amer: 9 mL/min — ABNORMAL LOW (ref >60)
GFR calc non Af Amer: 8 mL/min — ABNORMAL LOW (ref >60)
Glucose, Bld: 115 mg/dL — ABNORMAL HIGH (ref 70–99)
Potassium: 3.6 mmol/L (ref 3.5–5.1)
Sodium: 138 mmol/L (ref 135–145)

## 2018-05-31 LAB — MAGNESIUM: Magnesium: 1.8 mg/dL (ref 1.7–2.4)

## 2018-05-31 LAB — PHOSPHORUS: Phosphorus: 4.2 mg/dL (ref 2.5–4.6)

## 2018-05-31 MED ORDER — AZITHROMYCIN 250 MG PO TABS: 250.0000 mg | ORAL_TABLET | Freq: Every day | ORAL | 0 refills | Status: DC

## 2018-05-31 MED ORDER — AZITHROMYCIN 250 MG PO TABS
500.0000 mg | ORAL_TABLET | Freq: Every day | ORAL | Status: AC
  Administered 2018-05-31: 500 mg via ORAL
  Filled 2018-05-31: qty 2

## 2018-05-31 MED ORDER — SODIUM CHLORIDE 0.9 % IV SOLN
1.0000 g | INTRAVENOUS | Status: DC
  Administered 2018-05-31: 1 g via INTRAVENOUS
  Filled 2018-05-31: qty 1
  Filled 2018-05-31: qty 10

## 2018-05-31 MED ORDER — CARVEDILOL 3.125 MG PO TABS: 3.1250 mg | ORAL_TABLET | Freq: Two times a day (BID) | ORAL | 0 refills | Status: AC

## 2018-05-31 MED ORDER — HYDROCOD POLST-CPM POLST ER 10-8 MG/5ML PO SUER: 5.0000 mL | Freq: Two times a day (BID) | ORAL | 0 refills | Status: AC | PRN

## 2018-05-31 MED ORDER — AZITHROMYCIN 250 MG PO TABS: 250.0000 mg | ORAL_TABLET | Freq: Every day | ORAL | Status: DC

## 2018-05-31 NOTE — NC FL2 (Signed)
[place] LEVEL OF CARE SCREENING TOOL     IDENTIFICATION  Patient Name: Toni Carlson Birthdate: Dec 20, 1945 Sex: female Admission Date (Current Location): 05/28/2018  [place] and [place] Number:  Engineering geologist and Address:  [facility], [address] [place], [place] [address]      Provider Number: [id number]  Attending Physician Name and Address:  Otila Back, MD  Relative Name and Phone Number:       Current Level of Care: Hospital Recommended Level of Care: [place] Prior Approval Number:    Date Approved/Denied:   PASRR Number:    Discharge Plan: (ALF)    Current Diagnoses: Patient Active Problem List   Diagnosis Date Noted  . HCAP (healthcare-associated pneumonia) 05/28/2018  . Acute respiratory failure with hypoxia ([place]) 05/16/2018  . Complication of AV dialysis fistula 04/17/2017  . 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 Hyperlipidemia 03/16/2017  . Complication from renal dialysis device 11/27/2016  . Gastric polyp 08/18/2015  . Anemia 07/15/2015  . GI bleed 07/15/2015  . Polyneuropathy 07/08/2015  . Degenerative disc disease, lumbar 07/08/2015  . Pancytopenia ([place]) 07/08/2015  . Generalized weakness 07/08/2015  . End stage renal disease on dialysis ([place]) 07/08/2015  . Atherosclerotic peripheral vascular disease ([place]) 07/08/2015  . Pain in both feet 07/02/2015  . Gastric ulceration   . Acute GI bleeding   . GIB (gastrointestinal bleeding) 04/12/2015  . Left-sided weakness 04/01/2015  . Essential hypertension 04/01/2015  . Anemia of chronic disease 04/01/2015  . Elevated troponin 04/01/2015  . Pulmonary hypertension ([place]) 04/01/2015  . Atrial fibrillation ([place])   . CVA (cerebral infarction) 03/26/2015    Orientation RESPIRATION BLADDER Height & Weight     Self, Time, Situation, Place  Normal Continent Weight: 170 lb 13.7 oz (77.5 kg) Height:  5\' 7"  (170.2 cm)  BEHAVIORAL  SYMPTOMS/MOOD NEUROLOGICAL BOWEL NUTRITION STATUS  (none) (none) Incontinent Diet(renal with fluid restriction )  AMBULATORY STATUS COMMUNICATION OF NEEDS Skin   Limited Assist Verbally Normal                       Personal Care Assistance Level of Assistance  Bathing, Dressing Bathing Assistance: Limited assistance Feeding assistance: Limited assistance Dressing Assistance: Limited assistance     Functional Limitations Info  (none )          SPECIAL CARE FACTORS FREQUENCY  (outpatient dialysis)                    Contractures      Additional Factors Info  Code Status Code Status Info: full             Allergies as of 05/31/2018   No Known Allergies        Medication List    TAKE these medications   acetaminophen 500 MG tablet Commonly known as:  TYLENOL Take 500 mg by mouth every 4 (four) hours as needed for mild pain, fever or headache.   atorvastatin 40 MG tablet Commonly known as:  LIPITOR Take 1 tablet (40 mg total) by mouth daily. What changed:  when to take this   azithromycin 250 MG tablet Commonly known as:  ZITHROMAX Take 1 tablet (250 mg total) by mouth daily.   bisacodyl 5 MG EC tablet Commonly known as:  DULCOLAX Take 5 mg by mouth daily as needed for mild constipation or moderate constipation.   calcium acetate 667 MG capsule Commonly known as:  PHOSLO Take 1,334 mg by mouth 3 (three) times daily.   calcium carbonate 500 MG chewable tablet Commonly known as:  TUMS - dosed in mg elemental calcium Chew 1 tablet by mouth 3 (three) times daily as needed.   carvedilol 3.125 MG tablet Commonly known as:  COREG Take 1 tablet (3.125 mg total) by mouth 2 (two) times daily with a meal for 30 days. What changed:    medication strength  how much to take   cetirizine 10 MG tablet Commonly known as:  ZYRTEC Take 10 mg by mouth daily as needed for allergies.   chlorpheniramine-HYDROcodone 10-8 MG/5ML [place] Commonly  known as:  TUSSIONEX Take 5 mLs by mouth every 12 (twelve) hours as needed for cough.   clopidogrel 75 MG tablet Commonly known as:  PLAVIX Take 75 mg daily by mouth.   escitalopram 10 MG tablet Commonly known as:  LEXAPRO Take 10 mg by mouth daily.   gabapentin 300 MG capsule Commonly known as:  NEURONTIN Take 300 mg by mouth at bedtime.   guaifenesin 100 MG/5ML syrup Commonly known as:  ROBITUSSIN Take 200 mg by mouth every 6 (six) hours as needed for cough.   loperamide 2 MG capsule Commonly known as:  IMODIUM Take 2 mg by mouth as needed for diarrhea or loose stools.   loratadine 10 MG tablet Commonly known as:  CLARITIN Take 10 mg by mouth daily.   magnesium hydroxide 400 MG/5ML suspension Commonly known as:  MILK OF MAGNESIA Take 30 mLs by mouth at bedtime as needed for mild constipation.   [place] 200-200-20 MG/5ML suspension Generic drug:  alum & mag hydroxide-simeth Take 30 mLs by mouth 4 (four) times daily as needed for indigestion or heartburn.   mupirocin ointment 2 % Commonly known as:  BACTROBAN Place 1 application into the nose 2 (two) times daily.   ondansetron 4 MG tablet Commonly known as:  ZOFRAN Take 4 mg by mouth every 6 (six) hours as needed for nausea or vomiting.   pantoprazole 40 MG tablet Commonly known as:  PROTONIX Take 1 tablet (40 mg total) by mouth 2 (two) times daily.   polyethylene glycol packet Commonly known as:  MIRALAX / GLYCOLAX Take 17 g by mouth daily as needed for mild constipation.   senna-docusate 8.6-50 MG tablet Commonly known as:  Senokot-S Take 1 tablet by mouth at bedtime as needed for mild constipation.   spironolactone 25 MG tablet Commonly known as:  ALDACTONE Take 1 tablet (25 mg total) by mouth daily.   traZODone 50 MG tablet Commonly known as:  DESYREL Take 50 mg by mouth at bedtime.   TRIPLE ANTIBIOTIC [id number] Oint Apply 1 application topically 3 times/day as needed-between meals  & bedtime (for wound care).   trolamine salicylate 10 % cream Commonly known as:  ASPERCREME Apply topically 2 (two) times daily as needed for muscle pain.        Additional Information    Shela Leff, LCSW

## 2018-05-31 NOTE — Clinical Social Work Note (Signed)
Patient discharging today to [facility]. Discharge information sent to [facility]. It was difficult reaching anyone at [facility] to notify that patient was returning. Finally after 4 phone calls and not being able to be connected to anyone, Maudie Mercury came on the line and I told her patient was returning and she stated patient would have to come via EMS. Shela Leff MSW,LCSW [phone]

## 2018-05-31 NOTE — Care Management Note (Signed)
Case Management Note  Patient Details  Name: Toni Carlson MRN: [id number] Date of Birth: 1945/12/08   Patient to return to [facility]. Home health Resumption orders are in place. Corene Cornea with [facility] notified of discharge   Subjective/Objective:                    Action/Plan:   Expected Discharge Date:  05/31/18               Expected Discharge Plan:  Assisted Living / Rest Home(with home health)  In-House Referral:     Discharge planning Services     Post Acute Care Choice:  Resumption of Svcs/PTA Provider Choice offered to:     DME Arranged:    DME Agency:     HH Arranged:  RN, PT, Nurse's Aide [place] Agency:  [place]  Status of Service:  Completed, signed off  If discussed at [place] of Stay Meetings, dates discussed:    Additional Comments:  Beverly Sessions, RN 05/31/2018, 11:50 AM

## 2018-05-31 NOTE — Progress Notes (Signed)
Discharge instructions reviewed with patient including followup visits and new medications.  Understanding was verbalized and all questions were answered.  IV removed without complication; patient tolerated well.  Patient discharged back to [facility] via EMS in stable condition.

## 2018-05-31 NOTE — Discharge Summary (Signed)
[facility] NAME: Toni Carlson    MR#:  [id number]  DATE OF BIRTH:  12-12-1945  DATE OF ADMISSION:  05/28/2018   ADMITTING PHYSICIAN: Harrie Foreman, MD  DATE OF DISCHARGE: 05/31/2018  PRIMARY CARE PHYSICIAN: No primary care provider on file.   ADMISSION DIAGNOSIS:  ESRD on hemodialysis ([place]) [N18.6, Z99.2] Viral URI with cough [J06.9, B97.89] Community acquired pneumonia of left lower lobe of lung ([place]) [J18.1] Acute on chronic congestive heart failure, unspecified heart failure type ([place]) [I50.9] DISCHARGE DIAGNOSIS:  Active Problems:   HCAP (healthcare-associated pneumonia)  SECONDARY DIAGNOSIS:   Past Medical History:  Diagnosis Date  . A-fib (HCC)    a. on warfarin  . Anemia   . Carotid artery stenosis    a. ultrasound 03/2015: RICA 64-40% stenosis, LICA less than 34% stenosis  . Chronic diastolic CHF (congestive heart failure) ([place])    a. echo 03/2015: EF 60-65%, normal wall motion, diastolic parameters were c/w restrictive physiology, indicative of decreased LV diastolic compliance and/or increased LA pressure, mild AS, mild MR, left atrium was severely dilated, RA was severely dilated, PASP was severely increased at 90 mm Hg  . Coronary artery disease, non-occlusive    a. cardiac cath 2013  . ESRD (end stage renal disease) ([place])    On Tue-Thur-Sat dialysis  . H/O ischemic right MCA stroke    a. 03/2015; b. residual left-sided facial droop and slurred speech  . Hyperparathyroidism, secondary renal ([place])   . Hypertension   . Nicotine dependence   . Pulmonary HTN ([place])    as above  . Symptomatic bradycardia    a. history of symptomatic bradycardia; b. felt to be 2/2 metabolic abnormalities & required temp wire but no PPM, resolved with HD   HOSPITAL COURSE:  Chief complaint; cough and shortness of breath   Chief Complaint: Shortness of breath HPI: The patient with past medical history of end-stage renal  disease on dialysis, CHF, A. fib, CAD and hypertension presented to the emergency department complaining of cough and shortness of breath.  The patient states that her cough has started to hurt her throat.  She reported that her throat was so sore that she has difficulty eating.  She has had some chicken broth but not much else in the last week.  In the emergency department the patient was afebrile and was negative for influenza.  BNP was significantly elevated.  Chest x-ray showed some interstitial edema with possible superimposed infiltrate. CT scan of the chest done without contrast revealed superior segment left lower lung consolidation likely representing pneumonia.  The patient was given ceftriaxone and azithromycin prior to the emergency department staff calling the hospitalist service for admission.  Please refer to the H&P dictated for further details.   HOSPITAL COURSE; 1. Pneumonia: Healthcare associated;  Initially placed on IV Levaquin and vancomycin.  Levofloxacin subsequently discontinued and patient placed on IV cefepime for adequate gram-negative coverage in addition to vancomycin.  Patient significantly improved clinically.  Remains afebrile.  No leukocytosis.  Influenza test was negative.  Patient clinically and hemodynamically stable.  Coughing significantly improved with as needed Tussionex.  Being discharged on p.o. antibiotics with azithromycin for a few more days.  Follow-up with regular doctor post discharge from the hospital.  Patient stated she follows up with her nephrologist as her primary care physician.  Appointment made for follow-up.  2. ESRD: On dialysis; the patient was fluid overloaded which resolved with hemodialysis.  Seen by nephrology service for inpatient hemodialysis during this admission.   3. Hypertension: Controlled; continue Coreg   4. CHF: Chronic; diastolic. Continue spironolactone.  5. CAD: Stable; continue Plavix  6. Hyperlipidemia:  Continue statin therapy   DISCHARGE CONDITIONS:  Stable CONSULTS OBTAINED:   DRUG ALLERGIES:  No Known Allergies DISCHARGE MEDICATIONS:   Allergies as of 05/31/2018   No Known Allergies     Medication List    TAKE these medications   acetaminophen 500 MG tablet Commonly known as:  TYLENOL Take 500 mg by mouth every 4 (four) hours as needed for mild pain, fever or headache.   atorvastatin 40 MG tablet Commonly known as:  LIPITOR Take 1 tablet (40 mg total) by mouth daily. What changed:  when to take this   azithromycin 250 MG tablet Commonly known as:  ZITHROMAX Take 1 tablet (250 mg total) by mouth daily.   bisacodyl 5 MG EC tablet Commonly known as:  DULCOLAX Take 5 mg by mouth daily as needed for mild constipation or moderate constipation.   calcium acetate 667 MG capsule Commonly known as:  PHOSLO Take 1,334 mg by mouth 3 (three) times daily.   calcium carbonate 500 MG chewable tablet Commonly known as:  TUMS - dosed in mg elemental calcium Chew 1 tablet by mouth 3 (three) times daily as needed.   carvedilol 3.125 MG tablet Commonly known as:  COREG Take 1 tablet (3.125 mg total) by mouth 2 (two) times daily with a meal for 30 days. What changed:    medication strength  how much to take   cetirizine 10 MG tablet Commonly known as:  ZYRTEC Take 10 mg by mouth daily as needed for allergies.   chlorpheniramine-HYDROcodone 10-8 MG/5ML Suer Commonly known as:  TUSSIONEX Take 5 mLs by mouth every 12 (twelve) hours as needed for cough.   clopidogrel 75 MG tablet Commonly known as:  PLAVIX Take 75 mg daily by mouth.   escitalopram 10 MG tablet Commonly known as:  LEXAPRO Take 10 mg by mouth daily.   gabapentin 300 MG capsule Commonly known as:  NEURONTIN Take 300 mg by mouth at bedtime.   guaifenesin 100 MG/5ML syrup Commonly known as:  ROBITUSSIN Take 200 mg by mouth every 6 (six) hours as needed for cough.   loperamide 2 MG capsule Commonly  known as:  IMODIUM Take 2 mg by mouth as needed for diarrhea or loose stools.   loratadine 10 MG tablet Commonly known as:  CLARITIN Take 10 mg by mouth daily.   magnesium hydroxide 400 MG/5ML suspension Commonly known as:  MILK OF MAGNESIA Take 30 mLs by mouth at bedtime as needed for mild constipation.   [place] 200-200-20 MG/5ML suspension Generic drug:  alum & mag hydroxide-simeth Take 30 mLs by mouth 4 (four) times daily as needed for indigestion or heartburn.   mupirocin ointment 2 % Commonly known as:  BACTROBAN Place 1 application into the nose 2 (two) times daily.   ondansetron 4 MG tablet Commonly known as:  ZOFRAN Take 4 mg by mouth every 6 (six) hours as needed for nausea or vomiting.   pantoprazole 40 MG tablet Commonly known as:  PROTONIX Take 1 tablet (40 mg total) by mouth 2 (two) times daily.   polyethylene glycol packet Commonly known as:  MIRALAX / GLYCOLAX Take 17 g by mouth daily as needed for mild constipation.   senna-docusate 8.6-50 MG tablet Commonly known as:  Senokot-S Take 1 tablet by mouth at bedtime as  needed for mild constipation.   spironolactone 25 MG tablet Commonly known as:  ALDACTONE Take 1 tablet (25 mg total) by mouth daily.   traZODone 50 MG tablet Commonly known as:  DESYREL Take 50 mg by mouth at bedtime.   TRIPLE ANTIBIOTIC [phone] Oint Apply 1 application topically 3 times/day as needed-between meals & bedtime (for wound care).   trolamine salicylate 10 % cream Commonly known as:  ASPERCREME Apply topically 2 (two) times daily as needed for muscle pain.        DISCHARGE INSTRUCTIONS:   DIET:  Renal hemodialysis diet  DISCHARGE CONDITION:  Stable ACTIVITY:  Activity as tolerated OXYGEN:  Home Oxygen: Yes.    Oxygen Delivery: Patient claims to be on as needed oxygen at nursing facility.  Continue the same on discharge.  DISCHARGE LOCATION:  Assisted living facility  If you experience worsening of your  admission symptoms, develop shortness of breath, life threatening emergency, suicidal or homicidal thoughts you must seek medical attention immediately by calling 911 or calling your MD immediately  if symptoms less severe.  You Must read complete instructions/literature along with all the possible adverse reactions/side effects for all the Medicines you take and that have been prescribed to you. Take any new Medicines after you have completely understood and accpet all the possible adverse reactions/side effects.   Please note  You were cared for by a hospitalist during your hospital stay. If you have any questions about your discharge medications or the care you received while you were in the hospital after you are discharged, you can call the unit and asked to speak with the hospitalist on call if the hospitalist that took care of you is not available. Once you are discharged, your primary care physician will handle any further medical issues. Please note that NO REFILLS for any discharge medications will be authorized once you are discharged, as it is imperative that you return to your primary care physician (or establish a relationship with a primary care physician if you do not have one) for your aftercare needs so that they can reassess your need for medications and monitor your lab values.    On the day of Discharge:  VITAL SIGNS:  Blood pressure (!) 95/53, pulse 85, temperature 98 F (36.7 C), temperature source Oral, resp. rate 16, height 5\' 7"  (1.702 m), weight 77.5 kg, SpO2 93 %. PHYSICAL EXAMINATION:  GENERAL:  73 y.o.-year-old patient lying in the bed with no acute distress.  EYES: Pupils equal, round, reactive to light and accommodation. No scleral icterus. Extraocular muscles intact.  HEENT: Head atraumatic, normocephalic. Oropharynx and nasopharynx clear.  NECK:  Supple, no jugular venous distention. No thyroid enlargement, no tenderness.  LUNGS: Normal breath sounds bilaterally,  no wheezing, rales,rhonchi or crepitation. No use of accessory muscles of respiration.  CARDIOVASCULAR: S1, S2 normal. No murmurs, rubs, or gallops.  ABDOMEN: Soft, non-tender, non-distended. Bowel sounds present. No organomegaly or mass.  EXTREMITIES: No pedal edema, cyanosis, or clubbing.  NEUROLOGIC: Cranial nerves II through XII are intact. Muscle strength 5/5 in all extremities. Sensation intact. Gait not checked.  PSYCHIATRIC: The patient is alert and oriented x 3.  SKIN: No obvious rash, lesion, or ulcer.  DATA REVIEW:   CBC Recent Labs  Lab 05/30/18 0404  WBC 5.9  HGB 8.5*  HCT 28.3*  PLT 127*    Chemistries  Recent Labs  Lab 05/28/18 0112  05/31/18 0622  NA 139   < > 138  K 4.1   < >  3.6  CL 98   < > 95*  CO2 28   < > 29  GLUCOSE 105*   < > 115*  BUN 36*   < > 24*  CREATININE 8.81*   < > 5.27*  CALCIUM 8.5*   < > 8.2*  MG  --    < > 1.8  AST 26  --   --   ALT 16  --   --   ALKPHOS 95  --   --   BILITOT 0.8  --   --    < > = values in this interval not displayed.     Microbiology Results  Results for orders placed or performed during the hospital encounter of 05/28/18  Group A Strep by PCR     Status: None   Collection Time: 05/28/18  1:24 AM  Result Value Ref Range Status   Group A Strep by PCR NOT DETECTED NOT DETECTED Final    Comment: Performed at [facility], [address] [place], [place] [address]  Blood Culture (routine x 2)     Status: None (Preliminary result)   Collection Time: 05/28/18  3:46 AM  Result Value Ref Range Status   Specimen Description BLOOD RIGHT HAND  Final   Special Requests   Final    BOTTLES DRAWN AEROBIC AND ANAEROBIC Blood Culture adequate volume   Culture   Final    NO GROWTH 3 DAYS Performed at [facility], [address] [place], [place] [address]    Report Status PENDING  Incomplete  Blood Culture (routine x 2)     Status: None (Preliminary result)   Collection Time: 05/28/18  3:47 AM    Result Value Ref Range Status   Specimen Description BLOOD RIGHT ARM  Final   Special Requests   Final    BOTTLES DRAWN AEROBIC AND ANAEROBIC Blood Culture results may not be optimal due to an excessive volume of blood received in culture bottles   Culture   Final    NO GROWTH 3 DAYS Performed at [facility], [address], [place], [place] [address]    Report Status PENDING  Incomplete    RADIOLOGY:  No results found.   Management plans discussed with the patient, family and they are in agreement.  CODE STATUS: Full Code   TOTAL TIME TAKING CARE OF THIS PATIENT: 36 minutes.    Darcia Lampi M.D on 05/31/2018 at 9:58 AM  Between 7am to 6pm - Pager - [phone]  After 6pm go to [email or web address] - [employer] [facility]  Office  [phone]  CC: Primary care physician; No primary care provider on file.   Note: This dictation was prepared with Dragon dictation along with smaller phrase technology. Any transcriptional errors that result from this process are unintentional.

## 2018-05-31 NOTE — Progress Notes (Signed)
[facility]  ROUNDING NOTE   Subjective:  Patient seen at bedside. Cough improved. Due for dialysis again tomorrow.   Objective:  Vital signs in last 24 hours:  Temp:  [97.5 F (36.4 C)-98.3 F (36.8 C)] 98 F (36.7 C) (01/16 1249) Pulse Rate:  [75-99] 78 (01/16 1249) Resp:  [16-20] 20 (01/16 1249) BP: (84-128)/(43-71) 99/62 (01/16 1249) SpO2:  [91 %-99 %] 99 % (01/16 1249)  Weight change: 1.179 kg Filed Weights   05/30/18 0554 05/30/18 0842 05/30/18 1207  Weight: 77 kg 78.2 kg 77.5 kg    Intake/Output: I/O last 3 completed shifts: In: 280 [P.O.:280] Out: 1682 [Other:1682]   Intake/Output this shift:  Total I/O In: 200 [P.O.:200] Out: -   Physical Exam: General: No acute distress  Head: Normocephalic, atraumatic. Moist oral mucosal membranes  Eyes: Anicteric  Neck: Supple, trachea midline  Lungs:  Left basilar rales  Heart: S1S2 no rubs  Abdomen:  Soft, nontender, bowel sounds present  Extremities: trace peripheral edema.  Neurologic: Awake, alert, following commands  Skin: No lesions  Access: LUE AVF    Basic Metabolic Panel: Recent Labs  Lab 05/28/18 0112 05/28/18 1633 05/29/18 0615 05/30/18 0404 05/30/18 1036 05/31/18 0622  NA 139  --  137 137  --  138  K 4.1  --  3.6 3.7  --  3.6  CL 98  --  98 97*  --  95*  CO2 28  --  28 28  --  29  GLUCOSE 105*  --  95 85  --  115*  BUN 36*  --  23 38*  --  24*  CREATININE 8.81*  --  5.52* 7.36*  --  5.27*  CALCIUM 8.5*  --  8.3* 8.2*  --  8.2*  MG  --   --  1.5* 1.9  --  1.8  PHOS  --  1.7* 3.0 4.6 4.6 4.2    Liver Function Tests: Recent Labs  Lab 05/28/18 0112  AST 26  ALT 16  ALKPHOS 95  BILITOT 0.8  PROT 7.4  ALBUMIN 3.9   Recent Labs  Lab 05/28/18 0112  LIPASE 18   No results for input(s): AMMONIA in the last 168 hours.  CBC: Recent Labs  Lab 05/28/18 0112 05/29/18 0615 05/30/18 0404  WBC 10.5 8.8 5.9  HGB 8.8* 8.2* 8.5*  HCT 29.6* 27.4* 28.3*  MCV 99.7 99.3  97.6  PLT 156 135* 127*    Cardiac Enzymes: Recent Labs  Lab 05/28/18 0112  TROPONINI 0.06*    BNP: Invalid input(s): POCBNP  CBG: No results for input(s): GLUCAP in the last 168 hours.  Microbiology: Results for orders placed or performed during the hospital encounter of 05/28/18  Group A Strep by PCR     Status: None   Collection Time: 05/28/18  1:24 AM  Result Value Ref Range Status   Group A Strep by PCR NOT DETECTED NOT DETECTED Final    Comment: Performed at [facility], [address] [place], [place] [address]  Blood Culture (routine x 2)     Status: None (Preliminary result)   Collection Time: 05/28/18  3:46 AM  Result Value Ref Range Status   Specimen Description BLOOD RIGHT HAND  Final   Special Requests   Final    BOTTLES DRAWN AEROBIC AND ANAEROBIC Blood Culture adequate volume   Culture   Final    NO GROWTH 3 DAYS Performed at [facility], [place],  [place] [address]    Report Status PENDING  Incomplete  Blood Culture (routine x 2)     Status: None (Preliminary result)   Collection Time: 05/28/18  3:47 AM  Result Value Ref Range Status   Specimen Description BLOOD RIGHT ARM  Final   Special Requests   Final    BOTTLES DRAWN AEROBIC AND ANAEROBIC Blood Culture results may not be optimal due to an excessive volume of blood received in culture bottles   Culture   Final    NO GROWTH 3 DAYS Performed at [facility], [address], [place], [place] [address]    Report Status PENDING  Incomplete    Coagulation Studies: No results for input(s): LABPROT, INR in the last 72 hours.  Urinalysis: No results for input(s): COLORURINE, LABSPEC, PHURINE, GLUCOSEU, HGBUR, BILIRUBINUR, KETONESUR, PROTEINUR, UROBILINOGEN, NITRITE, LEUKOCYTESUR in the last 72 hours.  Invalid input(s): APPERANCEUR    Imaging: No results found.   Medications:   . sodium chloride 250 mL (05/31/18 0935)  . cefTRIAXone (ROCEPHIN)   IV 1 g (05/31/18 0936)   . atorvastatin  40 mg Oral QHS  . [START ON 06/01/2018] azithromycin  250 mg Oral Daily  . calcium acetate  1,334 mg Oral TID WC  . carvedilol  6.25 mg Oral BID WC  . Chlorhexidine Gluconate Cloth  6 each Topical Q0600  . clopidogrel  75 mg Oral Daily  . docusate sodium  100 mg Oral BID  . epoetin (EPOGEN/PROCRIT) injection  10,000 Units Intravenous Q M,W,F-HD  . escitalopram  10 mg Oral Daily  . gabapentin  300 mg Oral QHS  . heparin  5,000 Units Subcutaneous Q8H  . loratadine  10 mg Oral Daily  . mupirocin ointment  1 application Nasal BID  . pantoprazole  40 mg Oral BID  . spironolactone  25 mg Oral Daily  . traZODone  50 mg Oral QHS   sodium chloride, acetaminophen **OR** acetaminophen, alum & mag hydroxide-simeth, bisacodyl, calcium carbonate, chlorpheniramine-HYDROcodone, guaiFENesin, loperamide, ondansetron **OR** ondansetron (ZOFRAN) IV, polyethylene glycol, TRIPLE ANTIBIOTIC, trolamine salicylate  Assessment/ Plan:  73 y.o. female with end stage renal disease on hemodialysis, hypertension, coronary artery disease, CVA, pulmonary hypertension, carotid artery stenosis, colon mass, atrial fibrillation, depression, hyperlipidemia  [facility] [address] Left AVF 74.5kg.   1. End stage renal disease:patient completed dialysis yesterday.  No acute indication for dialysis today.  We will plan for dialysis again tomorrow if the patient is still here.  2. Anemia of chronic kidney disease: hemoglobin 8.5 at last check.  Maintain the patient on Epogen 10,000 units IV with dialysis.  3. Secondary Hyperparathyroidism: phosphorus at target at 4.2.  Maintain the patient on calcium acetate.  4. Hypertension: Continue carvedilol 6.25 mg p.o. twice daily.  5.  Bacterial pneumonia.  Patient now transitioned to ceftriaxone and azithromycin.  LOS: 3 Demarkis Gheen 1/16/20201:42 PM

## 2018-06-02 LAB — CULTURE, BLOOD (ROUTINE X 2)
Culture: NO GROWTH
Culture: NO GROWTH
Special Requests: ADEQUATE

## 2018-06-03 ENCOUNTER — Emergency Department: Payer: Medicare Other

## 2018-06-03 ENCOUNTER — Inpatient Hospital Stay
Admission: EM | Admit: 2018-06-03 | Discharge: 2018-06-07 | DRG: 312 | Disposition: A | Discharge status: Home Health | Payer: Medicare Other | Attending: Internal Medicine | Admitting: Internal Medicine

## 2018-06-03 ENCOUNTER — Other Ambulatory Visit: Payer: Self-pay

## 2018-06-03 DIAGNOSIS — T500X5A Adverse effect of mineralocorticoids and their antagonists, initial encounter: Secondary | ICD-10-CM | POA: Diagnosis present

## 2018-06-03 DIAGNOSIS — R7989 Other specified abnormal findings of blood chemistry: Secondary | ICD-10-CM

## 2018-06-03 DIAGNOSIS — I69392 Facial weakness following cerebral infarction: Secondary | ICD-10-CM

## 2018-06-03 DIAGNOSIS — N186 End stage renal disease: Secondary | ICD-10-CM | POA: Diagnosis present

## 2018-06-03 DIAGNOSIS — R531 Weakness: Secondary | ICD-10-CM | POA: Diagnosis present

## 2018-06-03 DIAGNOSIS — I739 Peripheral vascular disease, unspecified: Secondary | ICD-10-CM | POA: Diagnosis present

## 2018-06-03 DIAGNOSIS — R9431 Abnormal electrocardiogram [ECG] [EKG]: Secondary | ICD-10-CM

## 2018-06-03 DIAGNOSIS — Z9181 History of falling: Secondary | ICD-10-CM

## 2018-06-03 DIAGNOSIS — I132 Hypertensive heart and chronic kidney disease with heart failure and with stage 5 chronic kidney disease, or end stage renal disease: Secondary | ICD-10-CM | POA: Diagnosis present

## 2018-06-03 DIAGNOSIS — I69328 Other speech and language deficits following cerebral infarction: Secondary | ICD-10-CM

## 2018-06-03 DIAGNOSIS — S8001XA Contusion of right knee, initial encounter: Secondary | ICD-10-CM

## 2018-06-03 DIAGNOSIS — K639 Disease of intestine, unspecified: Secondary | ICD-10-CM | POA: Diagnosis present

## 2018-06-03 DIAGNOSIS — G629 Polyneuropathy, unspecified: Secondary | ICD-10-CM | POA: Diagnosis present

## 2018-06-03 DIAGNOSIS — Z992 Dependence on renal dialysis: Secondary | ICD-10-CM | POA: Diagnosis not present

## 2018-06-03 DIAGNOSIS — E8770 Fluid overload, unspecified: Secondary | ICD-10-CM

## 2018-06-03 DIAGNOSIS — I272 Pulmonary hypertension, unspecified: Secondary | ICD-10-CM | POA: Diagnosis present

## 2018-06-03 DIAGNOSIS — T447X5A Adverse effect of beta-adrenoreceptor antagonists, initial encounter: Secondary | ICD-10-CM | POA: Diagnosis present

## 2018-06-03 DIAGNOSIS — D631 Anemia in chronic kidney disease: Secondary | ICD-10-CM | POA: Diagnosis present

## 2018-06-03 DIAGNOSIS — Z79899 Other long term (current) drug therapy: Secondary | ICD-10-CM

## 2018-06-03 DIAGNOSIS — I248 Other forms of acute ischemic heart disease: Secondary | ICD-10-CM

## 2018-06-03 DIAGNOSIS — Z8269 Family history of other diseases of the musculoskeletal system and connective tissue: Secondary | ICD-10-CM

## 2018-06-03 DIAGNOSIS — M79661 Pain in right lower leg: Secondary | ICD-10-CM | POA: Diagnosis present

## 2018-06-03 DIAGNOSIS — Z7902 Long term (current) use of antithrombotics/antiplatelets: Secondary | ICD-10-CM

## 2018-06-03 DIAGNOSIS — I2510 Atherosclerotic heart disease of native coronary artery without angina pectoris: Secondary | ICD-10-CM | POA: Diagnosis present

## 2018-06-03 DIAGNOSIS — Z87891 Personal history of nicotine dependence: Secondary | ICD-10-CM

## 2018-06-03 DIAGNOSIS — R778 Other specified abnormalities of plasma proteins: Secondary | ICD-10-CM

## 2018-06-03 DIAGNOSIS — E785 Hyperlipidemia, unspecified: Secondary | ICD-10-CM | POA: Diagnosis present

## 2018-06-03 DIAGNOSIS — I952 Hypotension due to drugs: Secondary | ICD-10-CM | POA: Diagnosis present

## 2018-06-03 DIAGNOSIS — F329 Major depressive disorder, single episode, unspecified: Secondary | ICD-10-CM | POA: Diagnosis present

## 2018-06-03 DIAGNOSIS — I5032 Chronic diastolic (congestive) heart failure: Secondary | ICD-10-CM | POA: Diagnosis present

## 2018-06-03 DIAGNOSIS — N2581 Secondary hyperparathyroidism of renal origin: Secondary | ICD-10-CM | POA: Diagnosis present

## 2018-06-03 DIAGNOSIS — I4891 Unspecified atrial fibrillation: Secondary | ICD-10-CM | POA: Diagnosis present

## 2018-06-03 DIAGNOSIS — R296 Repeated falls: Secondary | ICD-10-CM | POA: Diagnosis present

## 2018-06-03 DIAGNOSIS — Z8701 Personal history of pneumonia (recurrent): Secondary | ICD-10-CM | POA: Diagnosis not present

## 2018-06-03 DIAGNOSIS — I959 Hypotension, unspecified: Secondary | ICD-10-CM | POA: Diagnosis present

## 2018-06-03 DIAGNOSIS — Z8249 Family history of ischemic heart disease and other diseases of the circulatory system: Secondary | ICD-10-CM

## 2018-06-03 DIAGNOSIS — Z818 Family history of other mental and behavioral disorders: Secondary | ICD-10-CM

## 2018-06-03 LAB — BASIC METABOLIC PANEL
ANION GAP: 12 (ref 5–15)
BUN: 30 mg/dL — ABNORMAL HIGH (ref 8–23)
CO2: 28 mmol/L (ref 22–32)
Calcium: 8.1 mg/dL — ABNORMAL LOW (ref 8.9–10.3)
Chloride: 94 mmol/L — ABNORMAL LOW (ref 98–111)
Creatinine, Ser: 7.16 mg/dL — ABNORMAL HIGH (ref 0.44–1.00)
GFR calc Af Amer: 6 mL/min — ABNORMAL LOW (ref >60)
GFR calc non Af Amer: 5 mL/min — ABNORMAL LOW (ref >60)
Glucose, Bld: 118 mg/dL — ABNORMAL HIGH (ref 70–99)
Potassium: 3.9 mmol/L (ref 3.5–5.1)
Sodium: 134 mmol/L — ABNORMAL LOW (ref 135–145)

## 2018-06-03 LAB — CBC
HEMATOCRIT: 28.9 % — AB (ref 36.0–46.0)
HEMOGLOBIN: 8.6 g/dL — AB (ref 12.0–15.0)
MCH: 29.6 pg (ref 26.0–34.0)
MCHC: 29.8 g/dL — ABNORMAL LOW (ref 30.0–36.0)
MCV: 99.3 fL (ref 80.0–100.0)
Platelets: 156 10*3/uL (ref 150–400)
RBC: 2.91 MIL/uL — AB (ref 3.87–5.11)
RDW: 17.2 % — ABNORMAL HIGH (ref 11.5–15.5)
WBC: 10.9 10*3/uL — ABNORMAL HIGH (ref 4.0–10.5)
nRBC: 1.2 % — ABNORMAL HIGH (ref 0.0–0.2)

## 2018-06-03 LAB — PROTIME-INR
INR: 1.56
Prothrombin Time: 18.5 seconds — ABNORMAL HIGH (ref 11.4–15.2)

## 2018-06-03 LAB — TROPONIN I
Troponin I: 0.3 ng/mL (ref <0.03)
Troponin I: 0.37 ng/mL (ref <0.03)
Troponin I: 0.33 ng/mL (ref <0.03)
Troponin I: 0.33 ng/mL (ref <0.03)

## 2018-06-03 LAB — BRAIN NATRIURETIC PEPTIDE: B NATRIURETIC PEPTIDE 5: 1746 pg/mL — AB (ref 0.0–100.0)

## 2018-06-03 LAB — CORTISOL: Cortisol, Plasma: 18.9 ug/dL

## 2018-06-03 LAB — TSH: TSH: 2.195 u[IU]/mL (ref 0.350–4.500)

## 2018-06-03 MED ORDER — ALUM & MAG HYDROXIDE-SIMETH 200-200-20 MG/5ML PO SUSP: 30.0000 mL | Freq: Four times a day (QID) | ORAL | Status: DC | PRN

## 2018-06-03 MED ORDER — SODIUM CHLORIDE 0.9% FLUSH: 3.0000 mL | INTRAVENOUS | Status: DC | PRN

## 2018-06-03 MED ORDER — ESCITALOPRAM OXALATE 10 MG PO TABS
10.0000 mg | ORAL_TABLET | Freq: Every day | ORAL | Status: DC
  Administered 2018-06-03 – 2018-06-07 (×5): 10 mg via ORAL
  Filled 2018-06-03 (×5): qty 1

## 2018-06-03 MED ORDER — ACETAMINOPHEN 650 MG RE SUPP: 650.0000 mg | Freq: Four times a day (QID) | RECTAL | Status: DC | PRN

## 2018-06-03 MED ORDER — CARVEDILOL 3.125 MG PO TABS
3.1250 mg | ORAL_TABLET | Freq: Two times a day (BID) | ORAL | Status: DC
  Administered 2018-06-03 – 2018-06-04 (×2): 3.125 mg via ORAL
  Filled 2018-06-03 (×2): qty 1

## 2018-06-03 MED ORDER — TRIPLE ANTIBIOTIC 3.5-400-5000 EX OINT
1.0000 "application " | TOPICAL_OINTMENT | Freq: Two times a day (BID) | CUTANEOUS | Status: DC | PRN
  Filled 2018-06-03: qty 1

## 2018-06-03 MED ORDER — LORATADINE 10 MG PO TABS: 10.0000 mg | ORAL_TABLET | Freq: Every day | ORAL | Status: DC

## 2018-06-03 MED ORDER — SENNOSIDES-DOCUSATE SODIUM 8.6-50 MG PO TABS: 1.0000 | ORAL_TABLET | Freq: Every evening | ORAL | Status: DC | PRN

## 2018-06-03 MED ORDER — GUAIFENESIN 100 MG/5ML PO SYRP
200.0000 mg | ORAL_SOLUTION | Freq: Four times a day (QID) | ORAL | Status: DC | PRN
  Filled 2018-06-03 (×2): qty 10

## 2018-06-03 MED ORDER — ONDANSETRON HCL 4 MG/2ML IJ SOLN: 4.0000 mg | Freq: Four times a day (QID) | INTRAMUSCULAR | Status: DC | PRN

## 2018-06-03 MED ORDER — CLOPIDOGREL BISULFATE 75 MG PO TABS
75.0000 mg | ORAL_TABLET | Freq: Every day | ORAL | Status: DC
  Administered 2018-06-03 – 2018-06-07 (×5): 75 mg via ORAL
  Filled 2018-06-03 (×5): qty 1

## 2018-06-03 MED ORDER — LORATADINE 10 MG PO TABS
10.0000 mg | ORAL_TABLET | Freq: Every day | ORAL | Status: DC
  Administered 2018-06-03 – 2018-06-07 (×5): 10 mg via ORAL
  Filled 2018-06-03 (×5): qty 1

## 2018-06-03 MED ORDER — SODIUM CHLORIDE 0.9% FLUSH: 3.0000 mL | Freq: Once | INTRAVENOUS | Status: DC

## 2018-06-03 MED ORDER — HEPARIN SODIUM (PORCINE) 5000 UNIT/ML IJ SOLN
5000.0000 [IU] | Freq: Three times a day (TID) | INTRAMUSCULAR | Status: DC
  Administered 2018-06-03 – 2018-06-04 (×3): 5000 [IU] via SUBCUTANEOUS
  Filled 2018-06-03 (×5): qty 1

## 2018-06-03 MED ORDER — TRAZODONE HCL 50 MG PO TABS
50.0000 mg | ORAL_TABLET | Freq: Every day | ORAL | Status: DC
  Administered 2018-06-03 – 2018-06-06 (×4): 50 mg via ORAL
  Filled 2018-06-03 (×4): qty 1

## 2018-06-03 MED ORDER — CALCIUM ACETATE (PHOS BINDER) 667 MG PO CAPS
1334.0000 mg | ORAL_CAPSULE | Freq: Three times a day (TID) | ORAL | Status: DC
  Administered 2018-06-03 – 2018-06-07 (×12): 1334 mg via ORAL
  Filled 2018-06-03 (×12): qty 2

## 2018-06-03 MED ORDER — ACETAMINOPHEN 325 MG PO TABS: 650.0000 mg | ORAL_TABLET | Freq: Four times a day (QID) | ORAL | Status: DC | PRN

## 2018-06-03 MED ORDER — MAGNESIUM HYDROXIDE 400 MG/5ML PO SUSP: 30.0000 mL | Freq: Every evening | ORAL | Status: DC | PRN

## 2018-06-03 MED ORDER — PANTOPRAZOLE SODIUM 40 MG PO TBEC
40.0000 mg | DELAYED_RELEASE_TABLET | Freq: Two times a day (BID) | ORAL | Status: DC
  Administered 2018-06-03 – 2018-06-07 (×8): 40 mg via ORAL
  Filled 2018-06-03 (×10): qty 1

## 2018-06-03 MED ORDER — SODIUM CHLORIDE 0.9 % IV SOLN: 250.0000 mL | INTRAVENOUS | Status: DC | PRN

## 2018-06-03 MED ORDER — TROLAMINE SALICYLATE 10 % EX CREA
TOPICAL_CREAM | Freq: Two times a day (BID) | CUTANEOUS | Status: DC | PRN
  Filled 2018-06-03 (×2): qty 85

## 2018-06-03 MED ORDER — HYDROCOD POLST-CPM POLST ER 10-8 MG/5ML PO SUER: 5.0000 mL | Freq: Two times a day (BID) | ORAL | Status: DC | PRN

## 2018-06-03 MED ORDER — GUAIFENESIN ER 600 MG PO TB12
600.0000 mg | ORAL_TABLET | Freq: Two times a day (BID) | ORAL | Status: DC
  Administered 2018-06-03 – 2018-06-07 (×9): 600 mg via ORAL
  Filled 2018-06-03 (×9): qty 1

## 2018-06-03 MED ORDER — POLYETHYLENE GLYCOL 3350 17 G PO PACK: 17.0000 g | PACK | Freq: Every day | ORAL | Status: DC | PRN

## 2018-06-03 MED ORDER — ACETAMINOPHEN 500 MG PO TABS: 500.0000 mg | ORAL_TABLET | ORAL | Status: DC | PRN

## 2018-06-03 MED ORDER — MUPIROCIN 2 % EX OINT
1.0000 "application " | TOPICAL_OINTMENT | Freq: Two times a day (BID) | CUTANEOUS | Status: DC
  Administered 2018-06-03 – 2018-06-07 (×8): 1 via NASAL
  Filled 2018-06-03: qty 22

## 2018-06-03 MED ORDER — BISACODYL 5 MG PO TBEC: 5.0000 mg | DELAYED_RELEASE_TABLET | Freq: Every day | ORAL | Status: DC | PRN

## 2018-06-03 MED ORDER — HYDROCODONE-ACETAMINOPHEN 5-325 MG PO TABS
1.0000 | ORAL_TABLET | ORAL | Status: DC | PRN
  Administered 2018-06-03 (×2): 2 via ORAL
  Administered 2018-06-05: 1 via ORAL
  Filled 2018-06-03: qty 1
  Filled 2018-06-03 (×2): qty 2

## 2018-06-03 MED ORDER — GABAPENTIN 300 MG PO CAPS
300.0000 mg | ORAL_CAPSULE | Freq: Every day | ORAL | Status: DC
  Administered 2018-06-03 – 2018-06-06 (×4): 300 mg via ORAL
  Filled 2018-06-03 (×4): qty 1

## 2018-06-03 MED ORDER — CALCIUM CARBONATE ANTACID 500 MG PO CHEW
1.0000 | CHEWABLE_TABLET | Freq: Three times a day (TID) | ORAL | Status: DC
  Administered 2018-06-03 – 2018-06-07 (×11): 200 mg via ORAL
  Filled 2018-06-03 (×11): qty 1

## 2018-06-03 MED ORDER — SODIUM CHLORIDE 0.9% FLUSH
3.0000 mL | Freq: Two times a day (BID) | INTRAVENOUS | Status: DC
  Administered 2018-06-03 – 2018-06-07 (×8): 3 mL via INTRAVENOUS

## 2018-06-03 MED ORDER — ONDANSETRON HCL 4 MG PO TABS: 4.0000 mg | ORAL_TABLET | Freq: Four times a day (QID) | ORAL | Status: DC | PRN

## 2018-06-03 MED ORDER — MIDODRINE HCL 5 MG PO TABS
5.0000 mg | ORAL_TABLET | Freq: Three times a day (TID) | ORAL | Status: DC
  Administered 2018-06-03 – 2018-06-07 (×11): 5 mg via ORAL
  Filled 2018-06-03 (×11): qty 1

## 2018-06-03 MED ORDER — ATORVASTATIN CALCIUM 20 MG PO TABS
40.0000 mg | ORAL_TABLET | Freq: Every day | ORAL | Status: DC
  Administered 2018-06-03 – 2018-06-06 (×4): 40 mg via ORAL
  Filled 2018-06-03 (×4): qty 2

## 2018-06-03 MED ORDER — LOPERAMIDE HCL 2 MG PO CAPS: 2.0000 mg | ORAL_CAPSULE | ORAL | Status: DC | PRN

## 2018-06-03 NOTE — ED Provider Notes (Signed)
[facility] Emergency Department Provider Note   ____________________________________________   First MD Initiated Contact with Patient 06/03/18 [phone]     (approximate)  I have reviewed the triage vital signs and the nursing notes.   HISTORY  Chief Complaint Dizziness and Fall    HPI Toni Carlson is a 73 y.o. female history of congestive heart failure, coronary artery disease end-stage renal disease  Patient reports she had 2 falls yesterday, both of which occurred out of bed.  Did not strike her head, reports she fell onto her right leg during the second fall.  Reports she was reaching for things at the nightstand when she fell each time.  She is been feeling a little more fatigued recently, continues to have a cough.  At her last dialysis session on Friday.  She is continued to feel weak and tired, reports that her facility did not start her on antibiotic until yesterday evening as they had to wait a couple of days to get her prescriptions for her recent pneumonia.  No headache or neck pain.  No right shin feels "sore" but denies any pain or bleeding.  Thought she saw a little bit of blood in her stool earlier, but was small in amount  No fevers or chills, some slight shortness of breath when she is been awake the last day nonproductive cough which she reports is probably from her "pneumonia"  Past Medical History:  Diagnosis Date  . A-fib (HCC)    a. on warfarin  . Anemia   . Carotid artery stenosis    a. ultrasound 03/2015: RICA 54-56% stenosis, LICA less than 25% stenosis  . Chronic diastolic CHF (congestive heart failure) ([place])    a. echo 03/2015: EF 60-65%, normal wall motion, diastolic parameters were c/w restrictive physiology, indicative of decreased LV diastolic compliance and/or increased LA pressure, mild AS, mild MR, left atrium was severely dilated, RA was severely dilated, PASP was severely increased at 90 mm Hg  . Coronary artery  disease, non-occlusive    a. cardiac cath 2013  . ESRD (end stage renal disease) ([place])    On Tue-Thur-Sat dialysis  . H/O ischemic right MCA stroke    a. 03/2015; b. residual left-sided facial droop and slurred speech  . Hyperparathyroidism, secondary renal ([place])   . Hypertension   . Nicotine dependence   . Pulmonary HTN ([place])    as above  . Symptomatic bradycardia    a. history of symptomatic bradycardia; b. felt to be 2/2 metabolic abnormalities & required temp wire but no PPM, resolved with HD    Patient Active Problem List   Diagnosis Date Noted  . HCAP (healthcare-associated pneumonia) 05/28/2018  . Acute respiratory failure with hypoxia ([place]) 05/16/2018  . Complication of AV dialysis fistula 04/17/2017  . 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 Hyperlipidemia 03/16/2017  . Complication from renal dialysis device 11/27/2016  . Gastric polyp 08/18/2015  . Anemia 07/15/2015  . GI bleed 07/15/2015  . Polyneuropathy 07/08/2015  . Degenerative disc disease, lumbar 07/08/2015  . Pancytopenia ([place]) 07/08/2015  . Generalized weakness 07/08/2015  . End stage renal disease on dialysis ([place]) 07/08/2015  . Atherosclerotic peripheral vascular disease ([place]) 07/08/2015  . Pain in both feet 07/02/2015  . Gastric ulceration   . Acute GI bleeding   . GIB (gastrointestinal bleeding) 04/12/2015  . Left-sided weakness 04/01/2015  . Essential hypertension 04/01/2015  . Anemia of chronic disease 04/01/2015  . Elevated troponin 04/01/2015  . Pulmonary  hypertension ([place]) 04/01/2015  . Atrial fibrillation ([place])   . CVA (cerebral infarction) 03/26/2015    Past Surgical History:  Procedure Laterality Date  . A/V FISTULAGRAM Left 10/25/2016   Procedure: A/V Fistulagram;  Surgeon: Katha Cabal, MD;  Location: [place] [facility];  Service: Cardiovascular;  Laterality: Left;  . A/V FISTULAGRAM Left 01/23/2018   Procedure: A/V FISTULAGRAM;  Surgeon: Katha Cabal, MD;   Location: [place] [facility];  Service: Cardiovascular;  Laterality: Left;  . A/V FISTULAGRAM Left 05/15/2018   Procedure: A/V FISTULAGRAM;  Surgeon: Katha Cabal, MD;  Location: [place] [facility];  Service: Cardiovascular;  Laterality: Left;  . COLONOSCOPY WITH PROPOFOL N/A 12/19/2016   Procedure: COLONOSCOPY WITH PROPOFOL;  Surgeon: Jonathon Bellows, MD;  Location: [facility] ENDOSCOPY;  Service: Gastroenterology;  Laterality: N/A;  . ESOPHAGOGASTRODUODENOSCOPY (EGD) WITH PROPOFOL N/A 04/14/2015   Procedure: ESOPHAGOGASTRODUODENOSCOPY (EGD) WITH PROPOFOL;  Surgeon: Lucilla Lame, MD;  Location: [facility] ENDOSCOPY;  Service: Endoscopy;  Laterality: N/A;  . ESOPHAGOGASTRODUODENOSCOPY (EGD) WITH PROPOFOL N/A 07/17/2015   Procedure: ESOPHAGOGASTRODUODENOSCOPY (EGD) WITH PROPOFOL;  Surgeon: Josefine Class, MD;  Location: [facility] ENDOSCOPY;  Service: Endoscopy;  Laterality: N/A;  . ESOPHAGOGASTRODUODENOSCOPY (EGD) WITH PROPOFOL N/A 12/19/2016   Procedure: ESOPHAGOGASTRODUODENOSCOPY (EGD) WITH PROPOFOL;  Surgeon: Jonathon Bellows, MD;  Location: [facility] ENDOSCOPY;  Service: Gastroenterology;  Laterality: N/A;  . EUS N/A 09/24/2015   Procedure: UPPER ENDOSCOPIC ULTRASOUND (EUS) LINEAR;  Surgeon: Jola Schmidt, MD;  Location: [facility] ENDOSCOPY;  Service: Endoscopy;  Laterality: N/A;  . LOWER EXTREMITY ANGIOGRAPHY Right 06/09/2016   Procedure: Lower Extremity Angiography;  Surgeon: Algernon Huxley, MD;  Location: [place] [facility];  Service: Cardiovascular;  Laterality: Right;  . PERIPHERAL VASCULAR CATHETERIZATION N/A 02/23/2015   Procedure: A/V Shuntogram/Fistulagram;  Surgeon: Algernon Huxley, MD;  Location: [place] [facility];  Service: Cardiovascular;  Laterality: N/A;  . PERIPHERAL VASCULAR CATHETERIZATION N/A 02/23/2015   Procedure: A/V Shunt Intervention;  Surgeon: Algernon Huxley, MD;  Location: [place] [facility];  Service: Cardiovascular;  Laterality: N/A;  . VISCERAL ANGIOGRAPHY N/A 04/04/2017   Procedure: VISCERAL  ANGIOGRAPHY;  Surgeon: Katha Cabal, MD;  Location: [place] [facility];  Service: Cardiovascular;  Laterality: N/A;    Prior to Admission medications   Medication Sig Start Date End Date Taking? Authorizing Provider  acetaminophen (TYLENOL) 500 MG tablet Take 500 mg by mouth every 4 (four) hours as needed for mild pain, fever or headache.     [provider]  alum & mag hydroxide-simeth (MINTOX) 200-200-20 MG/5ML suspension Take 30 mLs by mouth 4 (four) times daily as needed for indigestion or heartburn.     [provider]  atorvastatin (LIPITOR) 40 MG tablet Take 1 tablet (40 mg total) by mouth daily. Patient taking differently: Take 40 mg by mouth at bedtime.  09/19/15   Carrie Mew, MD  azithromycin (ZITHROMAX) 250 MG tablet Take 1 tablet (250 mg total) by mouth daily. 05/31/18   Stark Jock Jude, MD  bisacodyl (DULCOLAX) 5 MG EC tablet Take 5 mg by mouth daily as needed for mild constipation or moderate constipation.    [provider]  calcium acetate (PHOSLO) 667 MG capsule Take 1,334 mg by mouth 3 (three) times daily.    [provider]  calcium carbonate (TUMS - DOSED IN MG ELEMENTAL CALCIUM) 500 MG chewable tablet Chew 1 tablet by mouth 3 (three) times daily as needed.     [provider]  carvedilol (COREG) 3.125 MG  tablet Take 1 tablet (3.125 mg total) by mouth 2 (two) times daily with a meal for 30 days. 05/31/18 06/30/18  Stark Jock Jude, MD  cetirizine (ZYRTEC) 10 MG tablet Take 10 mg by mouth daily as needed for allergies.     [provider]  chlorpheniramine-HYDROcodone (TUSSIONEX) 10-8 MG/5ML SUER Take 5 mLs by mouth every 12 (twelve) hours as needed for cough. 05/31/18   Stark Jock Jude, MD  clopidogrel (PLAVIX) 75 MG tablet Take 75 mg daily by mouth.    [provider]  escitalopram (LEXAPRO) 10 MG tablet Take 10 mg by mouth daily.    [provider]  gabapentin (NEURONTIN) 300 MG capsule Take 300 mg by mouth at  bedtime.    [provider]  guaifenesin (ROBITUSSIN) 100 MG/5ML syrup Take 200 mg by mouth every 6 (six) hours as needed for cough.    [provider]  loperamide (IMODIUM) 2 MG capsule Take 2 mg by mouth as needed for diarrhea or loose stools.    [provider]  loratadine (CLARITIN) 10 MG tablet Take 10 mg by mouth daily.    [provider]  magnesium hydroxide (MILK OF MAGNESIA) 400 MG/5ML suspension Take 30 mLs by mouth at bedtime as needed for mild constipation.    [provider]  mupirocin ointment (BACTROBAN) 2 % Place 1 application into the nose 2 (two) times daily.    [provider]  Neomycin-Bacitracin-Polymyxin (TRIPLE ANTIBIOTIC) [phone] [id number] OINT Apply 1 application topically 3 times/day as needed-between meals & bedtime (for wound care).     [provider]  ondansetron (ZOFRAN) 4 MG tablet Take 4 mg by mouth every 6 (six) hours as needed for nausea or vomiting.     [provider]  pantoprazole (PROTONIX) 40 MG tablet Take 1 tablet (40 mg total) by mouth 2 (two) times daily. 09/19/15   Carrie Mew, MD  polyethylene glycol [facility] / Floria Raveling) packet Take 17 g by mouth daily as needed for mild constipation. 09/19/15   Carrie Mew, MD  senna-docusate (SENOKOT-S) 8.6-50 MG tablet Take 1 tablet by mouth at bedtime as needed for mild constipation.    [provider]  spironolactone (ALDACTONE) 25 MG tablet Take 1 tablet (25 mg total) by mouth daily. 12/20/16   Fritzi Mandes, MD  traZODone (DESYREL) 50 MG tablet Take 50 mg by mouth at bedtime.  05/18/16   [provider]  trolamine salicylate (ASPERCREME) 10 % cream Apply topically 2 (two) times daily as needed for muscle pain. 09/19/15   Carrie Mew, MD    Allergies Patient has no known allergies.  Family History  Problem Relation Age of Onset  . CAD Father   . Dementia Mother   . Lupus Sister     Social History Social History    Tobacco Use  . Smoking status: Former Smoker    Packs/day: 0.25    Years: 30.00    Pack years: 7.50    Types: Cigarettes    Last attempt to quit: 11/20/2013    Years since quitting: 4.5  . Smokeless tobacco: Never Used  Substance Use Topics  . Alcohol use: No    Alcohol/week: 0.0 standard drinks  . Drug use: No    Review of Systems Constitutional: No fever/chills and feels generally fatigue Eyes: No visual changes. ENT: No sore throat. Cardiovascular: Denies chest pain. Respiratory: Denies shortness of breath. Gastrointestinal: No abdominal pain.   Genitourinary: Negative for dysuria. Musculoskeletal: Negative for back pain.  Some discomfort and  pain across the shin of the right leg.  No other injury.  Denies no bruising or weakness.  Denies hip pain. Skin: Negative for rash. Neurological: Negative for headaches, areas of focal weakness or numbness.    ____________________________________________   PHYSICAL EXAM:  VITAL SIGNS: ED Triage Vitals  Enc Vitals Group     BP 06/03/18 0645 97/63     Pulse Rate 06/03/18 0645 62     Resp 06/03/18 0645 17     Temp 06/03/18 0645 97.7 F (36.5 C)     Temp Source 06/03/18 0645 Oral     SpO2 06/03/18 0645 97 %     Weight 06/03/18 0647 171 lb 15.3 oz (78 kg)     Height --      Head Circumference --      Peak Flow --      Pain Score 06/03/18 0647 8     Pain Loc --      Pain Edu? --      Excl. in [place]? --     Constitutional: Alert and oriented.  Ill-appearing, chronically ill-appearing but in no acute distress. Eyes: Conjunctivae are normal. Head: Atraumatic. Nose: No congestion/rhinnorhea. Mouth/Throat: Mucous membranes are moist. Neck: No stridor.  No neck pain.  No cervical tenderness. Cardiovascular: Occasionally irregular with normal rate rate. Grossly normal heart sounds.  Good peripheral circulation. Respiratory: Normal respiratory effort.  No retractions. Lungs CTAB. Gastrointestinal: Soft and nontender. No  distention. Musculoskeletal: No lower extremity tenderness nor edema.  Right lower extremity slight tenderness over the anterior right shin, no deformity.  Strong dorsalis pedis pulse.  Able to wiggle her toes well.  No pain over the femur or hip or pelvis. Neurologic:  Normal speech and language. No gross focal neurologic deficits are appreciated.  Skin:  Skin is warm, dry and intact. No rash noted. Psychiatric: Mood and affect are normal. Speech and behavior are normal.  ____________________________________________   LABS (all labs ordered are listed, but only abnormal results are displayed)  Labs Reviewed  BASIC METABOLIC PANEL - Abnormal; Notable for the following components:      Result Value   Sodium 134 (*)    Chloride 94 (*)    Glucose, Bld 118 (*)    BUN 30 (*)    Creatinine, Ser 7.16 (*)    Calcium 8.1 (*)    GFR calc non Af Amer 5 (*)    GFR calc Af Amer 6 (*)    All other components within normal limits  CBC - Abnormal; Notable for the following components:   WBC 10.9 (*)    RBC 2.91 (*)    Hemoglobin 8.6 (*)    HCT 28.9 (*)    MCHC 29.8 (*)    RDW 17.2 (*)    nRBC 1.2 (*)    All other components within normal limits  TROPONIN I - Abnormal; Notable for the following components:   Troponin I 0.30 (*)    All other components within normal limits  PROTIME-INR - Abnormal; Notable for the following components:   Prothrombin Time 18.5 (*)    All other components within normal limits  BRAIN NATRIURETIC PEPTIDE   ____________________________________________  EKG  Reviewed interval me at 810 Heart rate 80 QRS 120 QTC about 530 Atrial fibrillation, sinus rhythm.  New T wave inversions are noted in 1 2 and aVL, also persistent T wave abnormality in V5 and V6 compared to previous from May 16, 2018.  Of note the patient is not  complaining of chest pain. ____________________________________________  RADIOLOGY  Dg Chest 2 View  Result Date: 06/03/2018 CLINICAL  DATA:  New atrial fibrillation EXAM: CHEST - 2 VIEW COMPARISON:  05/28/2018 chest radiograph. FINDINGS: Vascular stent overlies the left axillary region. Stable cardiomediastinal silhouette with moderate cardiomegaly. No pneumothorax. No pleural effusion. Mild pulmonary edema. Mild left basilar atelectasis. IMPRESSION: 1. Mild congestive heart failure. 2. Mild left basilar atelectasis. Electronically Signed   By: Ilona Sorrel M.D.   On: 06/03/2018 07:27   Dg Tibia/fibula Right  Result Date: 06/03/2018 CLINICAL DATA:  New atrial fibrillation, fall, right lower extremity pain EXAM: RIGHT TIBIA AND FIBULA - 2 VIEW COMPARISON:  None. FINDINGS: No fracture. No suspicious focal osseous lesions. Vascular stent is seen in right popliteal region. IMPRESSION: No fracture. Electronically Signed   By: Ilona Sorrel M.D.   On: 06/03/2018 07:28    ____________________________________________   PROCEDURES  Procedure(s) performed: None  Procedures  Critical Care performed: No  ____________________________________________   INITIAL IMPRESSION / ASSESSMENT AND PLAN / ED COURSE  Pertinent labs & imaging results that were available during my care of the patient were reviewed by me and considered in my medical decision making (see chart for details).  Patient presents after 2 falls.  Denies head injury.  No evidence of major trauma by exam.  Some soreness the right leg.  However she reports she has not started antibiotics until last night after being discharged with pneumonia, and also appears to have evidence of some volume overload.  Her EKG demonstrates new T wave abnormality, I suspect likely related to demand and a slightly elevated troponin but no chest pain.  Does report ongoing cough.  Differential diagnosis certainly includes volume overload, ongoing or worsening pneumonia, seems unlikely an acute coronary syndrome but demand ischemia, exclude ACS, patient does report being anticoagulated     ----------------------------------------- 8:30 AM on 06/03/2018 -----------------------------------------  Have paged her nephrologist, not yet received return page about 20 minutes.    ----------------------------------------- 9:24 AM on 06/03/2018 -----------------------------------------  Discussed with Dr. Zollie Scale.  Will see as inpatient.  Patient resting comfortably at this time, still mild hypotension with systolic blood pressure about 100.  She is alert and oriented, reports he feels okay.  I suspect she may be volume overloaded but also has associated hypotension.  Will defer to nephrology for consultation, also admit for further observation and trending of troponins, possible demand ischemia though patient not complaining of chest pain at this time.  ____________________________________________   FINAL CLINICAL IMPRESSION(S) / ED DIAGNOSES  Final diagnoses:  Hypervolemia, unspecified hypervolemia type  ESRD (end stage renal disease) ([place])  EKG abnormalities  Elevated troponin  Demand ischemia (HCC)  Contusion of right knee, initial encounter        Note:  This document was prepared using Dragon voice recognition software and may include unintentional dictation errors       Delman Kitten, MD 06/03/18 [phone]

## 2018-06-03 NOTE — ED Triage Notes (Signed)
Patient coming [facility] from [facility] for fall/dizziness. Patient reports 2 falls this evening. Patient reports after the first fall patient had bleeding from rectum. Patient c/o right lower/anterior leg pain post second fall.   Per EMS patient EKG showed AFib with LBBB, with no prior hx.

## 2018-06-03 NOTE — Clinical Social Work Note (Signed)
Clinical Social Work Assessment  Patient Details  Name: Toni Carlson MRN: [id number] Date of Birth: [birth year]  Date of referral:  06/03/18               Reason for consult:  Other (Comment Required)(From [facility])                Permission sought to share information with:  Facility Art therapist granted to share information::  Yes, Verbal Permission Granted  Name::        Agency::     Relationship::     Contact Information:     Housing/Transportation Living arrangements for the past 2 months:  [place] of Information:  Patient Patient Interpreter Needed:  None Criminal Activity/Legal Involvement Pertinent to Current Situation/Hospitalization:  No - Comment as needed Significant Relationships:  Adult Children Lives with:  Facility Resident Do you feel safe going back to the place where you live?  Yes Need for family participation in patient care:  No (Coment)  Care giving concerns: TBD- no family present  Facilities manager / plan: LCSW introduced myself to this patient who is 73 year old female oriented x4 and who is a LTC resident at [facility] ( been there for 4 years) Patient is on dialysis ( HEMO) from [place] 3 x week Mon-Wed Fridays. Patient has had recent falls and uses a walker and wheel chair depending on how she feels. She requires assistance with bathing and dressing. Patient is able to return once discharged to [facility]. She reports she does use 02 at her facility thinks its 2 litres.  Employment status:  Retired Forensic scientist:  Medicare PT Recommendations:  Not assessed at this time Information / Referral to community resources:   None requested  Patient/Family's Response to care: Good understanding  Patient/Family's Understanding of and Emotional Response to Diagnosis, Current Treatment, and Prognosis:  Patient has a good understanding of her health needs  Emotional  Assessment Appearance:    Attitude/Demeanor/Rapport:  Charismatic, Engaged Affect (typically observed):  Accepting, Adaptable, Pleasant Orientation:  Oriented to Self, Oriented to  Time, Oriented to Place, Oriented to Situation Alcohol / Substance use:  Not Applicable Psych involvement (Current and /or in the community):  No (Comment)  Discharge Needs  Concerns to be addressed:  No discharge needs identified Readmission within the last 30 days:  No Current discharge risk:  None Barriers to Discharge:  No Barriers Identified, Continued Medical Work up   [place], LCSW 06/03/2018, 10:12 AM

## 2018-06-03 NOTE — ED Notes (Signed)
Patient transported to X-ray 

## 2018-06-03 NOTE — ED Notes (Signed)
Caryl Pina, RN called to say pt may being going to another floor and to wait to bring pt.

## 2018-06-03 NOTE — ED Notes (Signed)
Pt given coffee OK per Dr. Jacqualine Code. Pt resting and reports no pain at this time. Waiting on admitting MD.

## 2018-06-03 NOTE — ED Notes (Signed)
ED TO INPATIENT HANDOFF REPORT  Name/Age/Gender Toni Carlson 73 y.o. female  Code Status Code Status History    Date Active Date Inactive Code Status Order ID Comments User Context   05/28/2018 0858 05/31/2018 1847 Full Code [id number]  Harrie Foreman, MD ED   05/16/2018 0908 05/17/2018 2004 Full Code [id number]  Demetrios Loll, MD Inpatient   01/19/2017 2358 01/21/2017 1926 Full Code [id number]  Lance Coon, MD Inpatient   12/17/2016 0630 12/20/2016 0017 Full Code [id number]  Saundra Shelling, MD Inpatient   11/16/2016 2051 11/17/2016 1531 Full Code [id number]  Fritzi Mandes, MD Inpatient   06/09/2016 1839 06/10/2016 Burt Full Code [id number]  Algernon Huxley, MD Inpatient   07/15/2015 1310 07/17/2015 1630 Full Code [id number]  Hillary Bow, MD ED   07/02/2015 1723 07/08/2015 2009 Full Code [id number]  Gladstone Lighter, MD Inpatient   04/12/2015 1303 04/15/2015 1800 DNR [id number]  Bettey Costa, MD Inpatient   03/26/2015 1605 04/01/2015 2024 Full Code [id number]  Gladstone Lighter, MD Inpatient   02/23/2015 0939 02/23/2015 1341 Full Code [id number]  Algernon Huxley, MD Inpatient      Home/SNF/Other Nursing home  Chief Complaint Fall  Level of Care/Admitting Diagnosis ED Disposition    ED Disposition Condition [place]: [place] [100120]  Level of Care: Med-Surg [16]  Diagnosis: Hypotension [254270]  Admitting Physician: Dustin Flock [[id number]]  Attending Physician: Dustin Flock [[id number]]  Estimated length of stay: past midnight tomorrow  Certification:: I certify this patient will need inpatient services for at least 2 midnights  PT Class (Do Not Modify): Inpatient [101]  PT Acc Code (Do Not Modify): Private [1]       Medical History Past Medical History:  Diagnosis Date  . A-fib (HCC)    a. on warfarin  . Anemia   . Carotid artery stenosis    a. ultrasound 03/2015: RICA 61-60% stenosis, LICA less than 73% stenosis  . Chronic diastolic CHF (congestive  heart failure) ([place])    a. echo 03/2015: EF 60-65%, normal wall motion, diastolic parameters were c/w restrictive physiology, indicative of decreased LV diastolic compliance and/or increased LA pressure, mild AS, mild MR, left atrium was severely dilated, RA was severely dilated, PASP was severely increased at 90 mm Hg  . Coronary artery disease, non-occlusive    a. cardiac cath 2013  . ESRD (end stage renal disease) ([place])    On Tue-Thur-Sat dialysis  . H/O ischemic right MCA stroke    a. 03/2015; b. residual left-sided facial droop and slurred speech  . Hyperparathyroidism, secondary renal ([place])   . Hypertension   . Nicotine dependence   . Pulmonary HTN ([place])    as above  . Symptomatic bradycardia    a. history of symptomatic bradycardia; b. felt to be 2/2 metabolic abnormalities & required temp wire but no PPM, resolved with HD    Allergies No Known Allergies  IV Location/Drains/Wounds Patient Lines/Drains/Airways Status   Active Line/Drains/Airways    Name:   Placement date:   Placement time:   Site:   Days:   Peripheral IV 06/03/18 Right Hand   06/03/18    0620    Hand   less than 1   Fistula / Graft Left Upper arm   -    -    Upper arm             Labs/Imaging Results for orders placed or performed during the hospital encounter of 06/03/18 (from the  past 48 hour(s))  Basic metabolic panel     Status: Abnormal   Collection Time: 06/03/18  6:50 AM  Result Value Ref Range   Sodium 134 (L) 135 - 145 mmol/L   Potassium 3.9 3.5 - 5.1 mmol/L   Chloride 94 (L) 98 - 111 mmol/L   CO2 28 22 - 32 mmol/L   Glucose, Bld 118 (H) 70 - 99 mg/dL   BUN 30 (H) 8 - 23 mg/dL   Creatinine, Ser 7.16 (H) 0.44 - 1.00 mg/dL   Calcium 8.1 (L) 8.9 - 10.3 mg/dL   GFR calc non Af Amer 5 (L) >60 mL/min   GFR calc Af Amer 6 (L) >60 mL/min   Anion gap 12 5 - 15    Comment: Performed at [facility], [address] [place], [place] [address]  CBC     Status: Abnormal   Collection Time:  06/03/18  6:50 AM  Result Value Ref Range   WBC 10.9 (H) 4.0 - 10.5 K/uL   RBC 2.91 (L) 3.87 - 5.11 MIL/uL   Hemoglobin 8.6 (L) 12.0 - 15.0 g/dL   HCT 28.9 (L) 36.0 - 46.0 %   MCV 99.3 80.0 - 100.0 fL   MCH 29.6 26.0 - 34.0 pg   MCHC 29.8 (L) 30.0 - 36.0 g/dL   RDW 17.2 (H) 11.5 - 15.5 %   Platelets 156 150 - 400 K/uL   nRBC 1.2 (H) 0.0 - 0.2 %    Comment: Performed at [facility], [address] [place], [place] [address]  Troponin I - ONCE - STAT     Status: Abnormal   Collection Time: 06/03/18  6:50 AM  Result Value Ref Range   Troponin I 0.30 (HH) <0.03 ng/mL    Comment: CRITICAL RESULT CALLED TO, READ BACK BY AND VERIFIED WITH JESSICA Colbey Wirtanen AT 0803 06/03/2018 DAS Performed at [facility], [address] [place], [place] [address]   Protime-INR     Status: Abnormal   Collection Time: 06/03/18  6:50 AM  Result Value Ref Range   Prothrombin Time 18.5 (H) 11.4 - 15.2 seconds   INR 1.56     Comment: Performed at [facility], [address] [place], [place] [address]  Brain natriuretic peptide     Status: Abnormal   Collection Time: 06/03/18  6:50 AM  Result Value Ref Range   B Natriuretic Peptide 1,746.0 (H) 0.0 - 100.0 pg/mL    Comment: Performed at [facility], [address] [place], [place] [address]   Dg Chest 2 View  Result Date: 06/03/2018 CLINICAL DATA:  New atrial fibrillation EXAM: CHEST - 2 VIEW COMPARISON:  05/28/2018 chest radiograph. FINDINGS: Vascular stent overlies the left axillary region. Stable cardiomediastinal silhouette with moderate cardiomegaly. No pneumothorax. No pleural effusion. Mild pulmonary edema. Mild left basilar atelectasis. IMPRESSION: 1. Mild congestive heart failure. 2. Mild left basilar atelectasis. Electronically Signed   By: Ilona Sorrel M.D.   On: 06/03/2018 07:27   Dg Tibia/fibula Right  Result Date: 06/03/2018 CLINICAL DATA:  New atrial fibrillation, fall, right lower extremity pain  EXAM: RIGHT TIBIA AND FIBULA - 2 VIEW COMPARISON:  None. FINDINGS: No fracture. No suspicious focal osseous lesions. Vascular stent is seen in right popliteal region. IMPRESSION: No fracture. Electronically Signed   By: Ilona Sorrel M.D.   On: 06/03/2018 07:28    Pending Labs [employer] (From admission, onward)    Start     Ordered  06/03/18 0938  Occult blood card to lab, stool RN will collect  Once,   STAT    Question:  Specimen to be collected by?  Answer:  RN will collect   06/03/18 0937   06/03/18 0937  Cortisol  Once,   STAT     06/03/18 0936   Signed and Held  CBC  (heparin)  Once,   R    Comments:  Baseline for heparin therapy IF NOT ALREADY DRAWN.  Notify MD if PLT < 100 K.    Signed and Held   Signed and Held  Creatinine, serum  (heparin)  Once,   R    Comments:  Baseline for heparin therapy IF NOT ALREADY DRAWN.    Signed and Held   Signed and Held  TSH  Once,   R     Signed and Held   Signed and Held  Troponin I - Now Then Q6H  Now then every 6 hours,   R     Signed and Held   Signed and Held  CBC  Tomorrow morning,   R     Signed and Held   Signed and Held  Basic metabolic panel  Tomorrow morning,   R     Signed and Held          Vitals/Pain Today's Vitals   06/03/18 0730 06/03/18 0800 06/03/18 0900 06/03/18 0930  BP: 99/61 105/80 (!) 96/58 95/61  Pulse: 79 (!) 105 85 93  Resp: 16 19 16 18   Temp:      TempSrc:      SpO2: 100% 100% 100% 97%  Weight:      PainSc:        Isolation Precautions No active isolations  Medications Medications  sodium chloride flush (NS) 0.9 % injection 3 mL (has no administration in time range)  midodrine (PROAMATINE) tablet 5 mg (has no administration in time range)    Mobility Up with assistance

## 2018-06-03 NOTE — ED Notes (Signed)
Nursing home updated on patient.

## 2018-06-03 NOTE — Progress Notes (Signed)
[facility]  ROUNDING NOTE   Subjective:  Patient comes back in after experiencing 2 falls at the nursing home. She was just discharged from the hospital for treatment of pneumonia. Patient will be due for dialysis again tomorrow.   Objective:  Vital signs in last 24 hours:  Temp:  [97.7 F (36.5 C)-98.6 F (37 C)] 98.6 F (37 C) (01/19 1112) Pulse Rate:  [62-105] 83 (01/19 1112) Resp:  [16-19] 18 (01/19 1112) BP: (95-105)/(58-80) 102/69 (01/19 1112) SpO2:  [97 %-100 %] 100 % (01/19 1112) Weight:  [74.8 kg-78 kg] 74.8 kg (01/19 1112)  Weight change:  Filed Weights   06/03/18 0647 06/03/18 1112  Weight: 78 kg 74.8 kg    Intake/Output: No intake/output data recorded.   Intake/Output this shift:  No intake/output data recorded.  Physical Exam: General: No acute distress  Head: Normocephalic, atraumatic. Moist oral mucosal membranes  Eyes: Anicteric  Neck: Supple, trachea midline  Lungs:  Left basilar rales, normal effort  Heart: S1S2 no rubs  Abdomen:  Soft, nontender, bowel sounds present  Extremities: trace peripheral edema.  Neurologic: Awake, alert, following commands  Skin: No lesions  Access: LUE AVF    Basic Metabolic Panel: Recent Labs  Lab 05/28/18 0112 05/28/18 1633 05/29/18 0615 05/30/18 0404 05/30/18 1036 05/31/18 0622 06/03/18 0650  NA 139  --  137 137  --  138 134*  K 4.1  --  3.6 3.7  --  3.6 3.9  CL 98  --  98 97*  --  95* 94*  CO2 28  --  28 28  --  29 28  GLUCOSE 105*  --  95 85  --  115* 118*  BUN 36*  --  23 38*  --  24* 30*  CREATININE 8.81*  --  5.52* 7.36*  --  5.27* 7.16*  CALCIUM 8.5*  --  8.3* 8.2*  --  8.2* 8.1*  MG  --   --  1.5* 1.9  --  1.8  --   PHOS  --  1.7* 3.0 4.6 4.6 4.2  --     Liver Function Tests: Recent Labs  Lab 05/28/18 0112  AST 26  ALT 16  ALKPHOS 95  BILITOT 0.8  PROT 7.4  ALBUMIN 3.9   Recent Labs  Lab 05/28/18 0112  LIPASE 18   No results for input(s): AMMONIA in the last 168  hours.  CBC: Recent Labs  Lab 05/28/18 0112 05/29/18 0615 05/30/18 0404 06/03/18 0650  WBC 10.5 8.8 5.9 10.9*  HGB 8.8* 8.2* 8.5* 8.6*  HCT 29.6* 27.4* 28.3* 28.9*  MCV 99.7 99.3 97.6 99.3  PLT 156 135* 127* 156    Cardiac Enzymes: Recent Labs  Lab 05/28/18 0112 06/03/18 0650 06/03/18 1122  TROPONINI 0.06* 0.30* 0.37*    BNP: Invalid input(s): POCBNP  CBG: No results for input(s): GLUCAP in the last 168 hours.  Microbiology: Results for orders placed or performed during the hospital encounter of 05/28/18  Group A Strep by PCR     Status: None   Collection Time: 05/28/18  1:24 AM  Result Value Ref Range Status   Group A Strep by PCR NOT DETECTED NOT DETECTED Final    Comment: Performed at [facility], [address] [place], [place] [address]  Blood Culture (routine x 2)     Status: None   Collection Time: 05/28/18  3:46 AM  Result Value Ref Range Status   Specimen Description BLOOD RIGHT HAND  Final  Special Requests   Final    BOTTLES DRAWN AEROBIC AND ANAEROBIC Blood Culture adequate volume   Culture   Final    NO GROWTH 5 DAYS Performed at [facility], [address] [place], [place] [address]    Report Status 06/02/2018 FINAL  Final  Blood Culture (routine x 2)     Status: None   Collection Time: 05/28/18  3:47 AM  Result Value Ref Range Status   Specimen Description BLOOD RIGHT ARM  Final   Special Requests   Final    BOTTLES DRAWN AEROBIC AND ANAEROBIC Blood Culture results may not be optimal due to an excessive volume of blood received in culture bottles   Culture   Final    NO GROWTH 5 DAYS Performed at [facility], [address] [place], [place] [address]    Report Status 06/02/2018 FINAL  Final    Coagulation Studies: Recent Labs    06/03/18 0650  LABPROT 18.5*  INR 1.56    Urinalysis: No results for input(s): COLORURINE, LABSPEC, PHURINE, GLUCOSEU, HGBUR, BILIRUBINUR, KETONESUR, PROTEINUR,  UROBILINOGEN, NITRITE, LEUKOCYTESUR in the last 72 hours.  Invalid input(s): APPERANCEUR    Imaging: Dg Chest 2 View  Result Date: 06/03/2018 CLINICAL DATA:  New atrial fibrillation EXAM: CHEST - 2 VIEW COMPARISON:  05/28/2018 chest radiograph. FINDINGS: Vascular stent overlies the left axillary region. Stable cardiomediastinal silhouette with moderate cardiomegaly. No pneumothorax. No pleural effusion. Mild pulmonary edema. Mild left basilar atelectasis. IMPRESSION: 1. Mild congestive heart failure. 2. Mild left basilar atelectasis. Electronically Signed   By: Ilona Sorrel M.D.   On: 06/03/2018 07:27   Dg Tibia/fibula Right  Result Date: 06/03/2018 CLINICAL DATA:  New atrial fibrillation, fall, right lower extremity pain EXAM: RIGHT TIBIA AND FIBULA - 2 VIEW COMPARISON:  None. FINDINGS: No fracture. No suspicious focal osseous lesions. Vascular stent is seen in right popliteal region. IMPRESSION: No fracture. Electronically Signed   By: Ilona Sorrel M.D.   On: 06/03/2018 07:28     Medications:   . sodium chloride     . atorvastatin  40 mg Oral QHS  . calcium acetate  1,334 mg Oral TID WC  . calcium carbonate  1 tablet Oral TID WC  . carvedilol  3.125 mg Oral BID WC  . clopidogrel  75 mg Oral Daily  . escitalopram  10 mg Oral Daily  . gabapentin  300 mg Oral QHS  . guaiFENesin  600 mg Oral BID  . heparin  5,000 Units Subcutaneous Q8H  . loratadine  10 mg Oral Daily  . midodrine  5 mg Oral TID WC  . mupirocin ointment  1 application Nasal BID  . pantoprazole  40 mg Oral BID  . sodium chloride flush  3 mL Intravenous Once  . sodium chloride flush  3 mL Intravenous Q12H  . traZODone  50 mg Oral QHS   sodium chloride, acetaminophen **OR** acetaminophen, alum & mag hydroxide-simeth, bisacodyl, chlorpheniramine-HYDROcodone, guaifenesin, HYDROcodone-acetaminophen, loperamide, ondansetron **OR** ondansetron (ZOFRAN) IV, ondansetron, polyethylene glycol, senna-docusate, sodium chloride  flush, TRIPLE ANTIBIOTIC, trolamine salicylate  Assessment/ Plan:  73 y.o. female with end stage renal disease on hemodialysis, hypertension, coronary artery disease, CVA, pulmonary hypertension, carotid artery stenosis, colon mass, atrial fibrillation, depression, hyperlipidemia  [facility] [address] Left AVF 74.5kg.   1. End stage renal disease: Patient due for dialysis again tomorrow.  Orders will be prepared.  2. Anemia of chronic kidney disease: Hemoglobin relatively stable at 8.6.  Maintain the  patient on Epogen 10,000 units IV with dialysis.  3. Secondary Hyperparathyroidism: Recheck serum phosphorus with dialysis tomorrow.  Otherwise continue calcium acetate.  4. Hypertension: Continue carvedilol 3.125 mg p.o. twice daily.  5.  Falls.  Work-up as per hospitalist.  LOS: 0 Mahalia Dykes 1/19/202012:43 PM

## 2018-06-03 NOTE — NC FL2 (Signed)
[place] LEVEL OF CARE SCREENING TOOL     IDENTIFICATION  Patient Name: Toni Carlson Birthdate: 07/08/1945 Sex: female Admission Date (Current Location): 06/03/2018  [facility] and [place] Number:  Engineering geologist and Address:  [facility], [address] [place], [place] [address]      Provider Number: [id number]  Attending Physician Name and Address:  Dustin Flock, MD  Relative Name and Phone Number:       Current Level of Care: Hospital Recommended Level of Care: [place] Prior Approval Number:    Date Approved/Denied:   PASRR Number:    Discharge Plan: ([facility])    Current Diagnoses: Patient Active Problem List   Diagnosis Date Noted  . Hypotension 06/03/2018  . HCAP (healthcare-associated pneumonia) 05/28/2018  . Acute respiratory failure with hypoxia ([place]) 05/16/2018  . Complication of AV dialysis fistula 04/17/2017  . 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 Hyperlipidemia 03/16/2017  . Complication from renal dialysis device 11/27/2016  . Gastric polyp 08/18/2015  . Anemia 07/15/2015  . GI bleed 07/15/2015  . Polyneuropathy 07/08/2015  . Degenerative disc disease, lumbar 07/08/2015  . Pancytopenia ([place]) 07/08/2015  . Generalized weakness 07/08/2015  . End stage renal disease on dialysis ([place]) 07/08/2015  . Atherosclerotic peripheral vascular disease ([place]) 07/08/2015  . Pain in both feet 07/02/2015  . Gastric ulceration   . Acute GI bleeding   . GIB (gastrointestinal bleeding) 04/12/2015  . Left-sided weakness 04/01/2015  . Essential hypertension 04/01/2015  . Anemia of chronic disease 04/01/2015  . Elevated troponin 04/01/2015  . Pulmonary hypertension ([place]) 04/01/2015  . Atrial fibrillation ([place])   . CVA (cerebral infarction) 03/26/2015    Orientation RESPIRATION BLADDER Height & Weight     Self, Time, Situation, Place  O2(2 litres) Continent Weight: 171 lb 15.3  oz (78 kg) Height:     BEHAVIORAL SYMPTOMS/MOOD NEUROLOGICAL BOWEL NUTRITION STATUS  (none) (none) Continent Diet(Renal with fluid restrictions)  AMBULATORY STATUS COMMUNICATION OF NEEDS Skin   Limited Assist Verbally Normal                       Personal Care Assistance Level of Assistance  Bathing, Dressing, Feeding, Total care Bathing Assistance: Limited assistance Feeding assistance: Independent Dressing Assistance: Limited assistance     Functional Limitations Info    Sight Info: Adequate Hearing Info: Adequate Speech Info: Adequate    SPECIAL CARE FACTORS FREQUENCY                       Contractures Contractures Info: Not present    Additional Factors Info  Code Status Code Status Info: Full Code Allergies Info: None           Current Medications (06/03/2018):  This is the current hospital active medication list Current Facility-Administered Medications  Medication Dose Route Frequency Provider Last Rate Last Dose  . midodrine (PROAMATINE) tablet 5 mg  5 mg Oral TID WC Dustin Flock, MD      . sodium chloride flush (NS) 0.9 % injection 3 mL  3 mL Intravenous Once Delman Kitten, MD       Current Outpatient Medications  Medication Sig Dispense Refill  . acetaminophen (TYLENOL) 500 MG tablet Take 500 mg by mouth every 4 (four) hours as needed for mild pain, fever or headache.     Marland Kitchen alum & mag hydroxide-simeth (MINTOX) 824-235-36 MG/5ML suspension Take 30 mLs by mouth 4 (four)  times daily as needed for indigestion or heartburn.     Marland Kitchen atorvastatin (LIPITOR) 40 MG tablet Take 1 tablet (40 mg total) by mouth daily. (Patient taking differently: Take 40 mg by mouth at bedtime. ) 30 tablet 0  . azithromycin (ZITHROMAX) 250 MG tablet Take 1 tablet (250 mg total) by mouth daily. 5 each 0  . bisacodyl (DULCOLAX) 5 MG EC tablet Take 5 mg by mouth daily as needed for mild constipation or moderate constipation.    . calcium acetate (PHOSLO) 667 MG capsule Take 1,334  mg by mouth 3 (three) times daily.    . carvedilol (COREG) 3.125 MG tablet Take 1 tablet (3.125 mg total) by mouth 2 (two) times daily with a meal for 30 days. 60 tablet 0  . cetirizine (ZYRTEC) 10 MG tablet Take 10 mg by mouth daily.     . chlorpheniramine-HYDROcodone (TUSSIONEX) 10-8 MG/5ML SUER Take 5 mLs by mouth every 12 (twelve) hours as needed for cough. 140 mL 0  . clopidogrel (PLAVIX) 75 MG tablet Take 75 mg daily by mouth.    . escitalopram (LEXAPRO) 10 MG tablet Take 10 mg by mouth daily.    Marland Kitchen gabapentin (NEURONTIN) 300 MG capsule Take 300 mg by mouth at bedtime.    Marland Kitchen guaifenesin (ROBITUSSIN) 100 MG/5ML syrup Take 200 mg by mouth every 6 (six) hours as needed for cough.    . loperamide (IMODIUM) 2 MG capsule Take 2 mg by mouth as needed for diarrhea or loose stools.    Marland Kitchen loratadine (CLARITIN) 10 MG tablet Take 10 mg by mouth daily.    . magnesium hydroxide (MILK OF MAGNESIA) 400 MG/5ML suspension Take 30 mLs by mouth at bedtime as needed for mild constipation.    . mupirocin ointment (BACTROBAN) 2 % Place 1 application into the nose 2 (two) times daily.    Marland Kitchen Neomycin-Bacitracin-Polymyxin (TRIPLE ANTIBIOTIC) [phone] OINT Apply 1 application topically 3 times/day as needed-between meals & bedtime (for wound care).     . pantoprazole (PROTONIX) 40 MG tablet Take 1 tablet (40 mg total) by mouth 2 (two) times daily. 60 tablet 0  . polyethylene glycol (MIRALAX / GLYCOLAX) packet Take 17 g by mouth daily as needed for mild constipation. 14 each 0  . senna-docusate (SENOKOT-S) 8.6-50 MG tablet Take 1 tablet by mouth at bedtime as needed for mild constipation.    Marland Kitchen spironolactone (ALDACTONE) 25 MG tablet Take 1 tablet (25 mg total) by mouth daily. 30 tablet 1  . traZODone (DESYREL) 50 MG tablet Take 50 mg by mouth at bedtime.     . calcium carbonate (TUMS - DOSED IN MG ELEMENTAL CALCIUM) 500 MG chewable tablet Chew 1 tablet by mouth 3 (three) times daily as needed.     . ondansetron  (ZOFRAN) 4 MG tablet Take 4 mg by mouth every 6 (six) hours as needed for nausea or vomiting.     . trolamine salicylate (ASPERCREME) 10 % cream Apply topically 2 (two) times daily as needed for muscle pain. (Patient not taking: Reported on 06/03/2018) 85 g 0     Discharge Medications: Please see discharge summary for a list of discharge medications.  Relevant Imaging Results:  Relevant Lab Results:   Additional Information [phone]  [name], [place]

## 2018-06-03 NOTE — Progress Notes (Signed)
Advanced care plan.  Purpose of the Encounter: CODE STATUS  Parties in Attendance: Patient herself  Patient's Decision Capacity: Intact Subjective/Patient's story: Toni Carlson  is a 73 y.o. female with a known history of atrial fibrillation, anemia, carotid artery disease, chronic diastolic CHF, end-stage renal disease, hypertension who was recently hospitalized with pneumonia.  Patient's presenting with 2 episodes of falling yesterday   Objective/Medical story I discussed with the patient regarding her desires for cardiac and pulmonary resuscitation and further goals of care   Goals of care determination:  Patient patient states that she wants to be a full code and would like to have aggressive treatment   CODE STATUS: Full code   Time spent discussing advanced care planning: 16 minutes

## 2018-06-03 NOTE — H&P (Signed)
[facility] NAME: Toni Carlson    MR#:  [id number]  DATE OF BIRTH:  [birth year]  DATE OF ADMISSION:  06/03/2018  PRIMARY CARE PHYSICIAN: Patient, No Pcp Per   REQUESTING/REFERRING PHYSICIAN: Delman Kitten, MD  CHIEF COMPLAINT:   Chief Complaint  Patient presents with  . Dizziness  . Fall    HISTORY OF PRESENT ILLNESS: Toni Carlson  is a 73 y.o. female with a known history of atrial fibrillation, anemia, carotid artery disease, chronic diastolic CHF, end-stage renal disease, hypertension who was recently hospitalized with pneumonia.  Patient's presenting with 2 episodes of falling yesterday.  She describes it as feeling dizzy and then falling.  Patient's blood pressure has been running low.  She states that even in dialysis her blood pressure was lower than normal.  Here she is noted to have blood pressure in the 90s.  She denies any chest pain or palpitations.  She does have cough from recent pneumonia.  Denies any fevers or chills.  Complains of pain in the right shin after a fall yesterday. PAST MEDICAL HISTORY:   Past Medical History:  Diagnosis Date  . A-fib (HCC)    a. on warfarin  . Anemia   . Carotid artery stenosis    a. ultrasound 03/2015: RICA 75-64% stenosis, LICA less than 33% stenosis  . Chronic diastolic CHF (congestive heart failure) ([place])    a. echo 03/2015: EF 60-65%, normal wall motion, diastolic parameters were c/w restrictive physiology, indicative of decreased LV diastolic compliance and/or increased LA pressure, mild AS, mild MR, left atrium was severely dilated, RA was severely dilated, PASP was severely increased at 90 mm Hg  . Coronary artery disease, non-occlusive    a. cardiac cath 2013  . ESRD (end stage renal disease) ([place])    On Tue-Thur-Sat dialysis  . H/O ischemic right MCA stroke    a. 03/2015; b. residual left-sided facial droop and slurred speech  . Hyperparathyroidism, secondary renal ([place])   .  Hypertension   . Nicotine dependence   . Pulmonary HTN ([place])    as above  . Symptomatic bradycardia    a. history of symptomatic bradycardia; b. felt to be 2/2 metabolic abnormalities & required temp wire but no PPM, resolved with HD    PAST SURGICAL HISTORY:  Past Surgical History:  Procedure Laterality Date  . A/V FISTULAGRAM Left 10/25/2016   Procedure: A/V Fistulagram;  Surgeon: Katha Cabal, MD;  Location: [place] [facility];  Service: Cardiovascular;  Laterality: Left;  . A/V FISTULAGRAM Left 01/23/2018   Procedure: A/V FISTULAGRAM;  Surgeon: Katha Cabal, MD;  Location: [place] [facility];  Service: Cardiovascular;  Laterality: Left;  . A/V FISTULAGRAM Left 05/15/2018   Procedure: A/V FISTULAGRAM;  Surgeon: Katha Cabal, MD;  Location: [place] [facility];  Service: Cardiovascular;  Laterality: Left;  . COLONOSCOPY WITH PROPOFOL N/A 12/19/2016   Procedure: COLONOSCOPY WITH PROPOFOL;  Surgeon: Jonathon Bellows, MD;  Location: [facility] ENDOSCOPY;  Service: Gastroenterology;  Laterality: N/A;  . ESOPHAGOGASTRODUODENOSCOPY (EGD) WITH PROPOFOL N/A 04/14/2015   Procedure: ESOPHAGOGASTRODUODENOSCOPY (EGD) WITH PROPOFOL;  Surgeon: Lucilla Lame, MD;  Location: [facility] ENDOSCOPY;  Service: Endoscopy;  Laterality: N/A;  . ESOPHAGOGASTRODUODENOSCOPY (EGD) WITH PROPOFOL N/A 07/17/2015   Procedure: ESOPHAGOGASTRODUODENOSCOPY (EGD) WITH PROPOFOL;  Surgeon: Josefine Class, MD;  Location: [facility] ENDOSCOPY;  Service: Endoscopy;  Laterality: N/A;  . ESOPHAGOGASTRODUODENOSCOPY (EGD) WITH PROPOFOL N/A 12/19/2016   Procedure: ESOPHAGOGASTRODUODENOSCOPY (EGD) WITH PROPOFOL;  Surgeon: Jonathon Bellows, MD;  Location: [facility] ENDOSCOPY;  Service: Gastroenterology;  Laterality: N/A;  . EUS N/A 09/24/2015   Procedure: UPPER ENDOSCOPIC ULTRASOUND (EUS) LINEAR;  Surgeon: Jola Schmidt, MD;  Location: [facility] ENDOSCOPY;  Service: Endoscopy;  Laterality: N/A;  . LOWER EXTREMITY ANGIOGRAPHY Right 06/09/2016   Procedure:  Lower Extremity Angiography;  Surgeon: Algernon Huxley, MD;  Location: [place] [facility];  Service: Cardiovascular;  Laterality: Right;  . PERIPHERAL VASCULAR CATHETERIZATION N/A 02/23/2015   Procedure: A/V Shuntogram/Fistulagram;  Surgeon: Algernon Huxley, MD;  Location: [place] [facility];  Service: Cardiovascular;  Laterality: N/A;  . PERIPHERAL VASCULAR CATHETERIZATION N/A 02/23/2015   Procedure: A/V Shunt Intervention;  Surgeon: Algernon Huxley, MD;  Location: [place] [facility];  Service: Cardiovascular;  Laterality: N/A;  . VISCERAL ANGIOGRAPHY N/A 04/04/2017   Procedure: VISCERAL ANGIOGRAPHY;  Surgeon: Katha Cabal, MD;  Location: [place] [facility];  Service: Cardiovascular;  Laterality: N/A;    SOCIAL HISTORY:  Social History   Tobacco Use  . Smoking status: Former Smoker    Packs/day: 0.25    Years: 30.00    Pack years: 7.50    Types: Cigarettes    Last attempt to quit: 11/20/2013    Years since quitting: 4.5  . Smokeless tobacco: Never Used  Substance Use Topics  . Alcohol use: No    Alcohol/week: 0.0 standard drinks    FAMILY HISTORY:  Family History  Problem Relation Age of Onset  . CAD Father   . Dementia Mother   . Lupus Sister     DRUG ALLERGIES: No Known Allergies  REVIEW OF SYSTEMS:   CONSTITUTIONAL: No fever, positive fatigue or positive weakness.  EYES: No blurred or double vision.  EARS, NOSE, AND THROAT: No tinnitus or ear pain.  RESPIRATORY: No cough, shortness of breath, wheezing or hemoptysis.  CARDIOVASCULAR: No chest pain, orthopnea, edema.  GASTROINTESTINAL: No nausea, vomiting, diarrhea or abdominal pain.  GENITOURINARY: No dysuria, hematuria.  ENDOCRINE: No polyuria, nocturia,  HEMATOLOGY: No anemia, easy bruising or bleeding SKIN: No rash or lesion. MUSCULOSKELETAL: No joint pain or arthritis.   NEUROLOGIC: No tingling, numbness, weakness.  PSYCHIATRY: No anxiety or depression.   MEDICATIONS AT HOME:  Prior to Admission medications    Medication Sig Start Date End Date Taking? Authorizing Provider  acetaminophen (TYLENOL) 500 MG tablet Take 500 mg by mouth every 4 (four) hours as needed for mild pain, fever or headache.     [provider]  alum & mag hydroxide-simeth (MINTOX) 200-200-20 MG/5ML suspension Take 30 mLs by mouth 4 (four) times daily as needed for indigestion or heartburn.     [provider]  atorvastatin (LIPITOR) 40 MG tablet Take 1 tablet (40 mg total) by mouth daily. Patient taking differently: Take 40 mg by mouth at bedtime.  09/19/15   Carrie Mew, MD  azithromycin (ZITHROMAX) 250 MG tablet Take 1 tablet (250 mg total) by mouth daily. 05/31/18   Stark Jock Jude, MD  bisacodyl (DULCOLAX) 5 MG EC tablet Take 5 mg by mouth daily as needed for mild constipation or moderate constipation.    [provider]  calcium acetate (PHOSLO) 667 MG capsule Take 1,334 mg by mouth 3 (three) times daily.    [provider]  calcium carbonate (TUMS - DOSED IN MG ELEMENTAL CALCIUM) 500 MG chewable tablet Chew 1 tablet by mouth 3 (three) times daily as needed.     [provider]  carvedilol (COREG) 3.125 MG tablet Take  1 tablet (3.125 mg total) by mouth 2 (two) times daily with a meal for 30 days. 05/31/18 06/30/18  Stark Jock Jude, MD  cetirizine (ZYRTEC) 10 MG tablet Take 10 mg by mouth daily as needed for allergies.     [provider]  chlorpheniramine-HYDROcodone (TUSSIONEX) 10-8 MG/5ML SUER Take 5 mLs by mouth every 12 (twelve) hours as needed for cough. 05/31/18   Stark Jock Jude, MD  clopidogrel (PLAVIX) 75 MG tablet Take 75 mg daily by mouth.    [provider]  escitalopram (LEXAPRO) 10 MG tablet Take 10 mg by mouth daily.    [provider]  gabapentin (NEURONTIN) 300 MG capsule Take 300 mg by mouth at bedtime.    [provider]  guaifenesin (ROBITUSSIN) 100 MG/5ML syrup Take 200 mg by mouth every 6 (six) hours as needed for cough.    [provider]  loperamide (IMODIUM) 2 MG capsule Take 2 mg by mouth as needed for diarrhea or loose stools.    [provider]  loratadine (CLARITIN) 10 MG tablet Take 10 mg by mouth daily.    [provider]  magnesium hydroxide (MILK OF MAGNESIA) 400 MG/5ML suspension Take 30 mLs by mouth at bedtime as needed for mild constipation.    [provider]  mupirocin ointment (BACTROBAN) 2 % Place 1 application into the nose 2 (two) times daily.    [provider]  Neomycin-Bacitracin-Polymyxin (TRIPLE ANTIBIOTIC) [phone] OINT Apply 1 application topically 3 times/day as needed-between meals & bedtime (for wound care).     [provider]  ondansetron (ZOFRAN) 4 MG tablet Take 4 mg by mouth every 6 (six) hours as needed for nausea or vomiting.     [provider]  pantoprazole (PROTONIX) 40 MG tablet Take 1 tablet (40 mg total) by mouth 2 (two) times daily. 09/19/15   Carrie Mew, MD  polyethylene glycol [facility] / Floria Raveling) packet Take 17 g by mouth daily as needed for mild constipation. 09/19/15   Carrie Mew, MD  senna-docusate (SENOKOT-S) 8.6-50 MG tablet Take 1 tablet by mouth at bedtime as needed for mild constipation.    [provider]  spironolactone (ALDACTONE) 25 MG tablet Take 1 tablet (25 mg total) by mouth daily. 12/20/16   Fritzi Mandes, MD  traZODone (DESYREL) 50 MG tablet Take 50 mg by mouth at bedtime.  05/18/16   [provider]  trolamine salicylate (ASPERCREME) 10 % cream Apply topically 2 (two) times daily as needed for muscle pain. 09/19/15   Carrie Mew, MD      PHYSICAL EXAMINATION:   VITAL SIGNS: Blood pressure (!) 96/58, pulse 85, temperature 97.7 F (36.5 C), temperature source Oral, resp. rate 16, weight 78 kg, SpO2 100 %.  GENERAL:  74 y.o.-year-old patient lying in the bed with no acute distress.  EYES: Pupils equal, round, reactive to light and accommodation. No scleral icterus.  Extraocular muscles intact.  HEENT: Head atraumatic, normocephalic. Oropharynx and nasopharynx clear.  NECK:  Supple, no jugular venous distention. No thyroid enlargement, no tenderness.  LUNGS: Normal breath sounds bilaterally, no wheezing, rales,rhonchi or crepitation. No use of accessory muscles of respiration.  CARDIOVASCULAR: S1, S2 normal. No murmurs, rubs, or gallops.  ABDOMEN: Soft, nontender, nondistended. Bowel sounds present. No organomegaly or mass.  EXTREMITIES: No pedal edema, cyanosis, or clubbing.  NEUROLOGIC: Cranial nerves II through XII are intact. Muscle strength 5/5 in all extremities. Sensation intact. Gait not checked.  PSYCHIATRIC: The patient is alert and oriented x 3.  SKIN: No obvious rash, lesion, or ulcer.   LABORATORY PANEL:   CBC Recent Labs  Lab 05/28/18 0112 05/29/18 0615 05/30/18 0404 06/03/18 0650  WBC 10.5 8.8 5.9 10.9*  HGB 8.8* 8.2* 8.5* 8.6*  HCT 29.6* 27.4* 28.3* 28.9*  PLT 156 135* 127* 156  MCV 99.7 99.3 97.6 99.3  MCH 29.6 29.7 29.3 29.6  MCHC 29.7* 29.9* 30.0 29.8*  RDW 16.8* 16.5* 16.2* 17.2*   ------------------------------------------------------------------------------------------------------------------  Chemistries  Recent Labs  Lab 05/28/18 0112 05/29/18 0615 05/30/18 0404 05/31/18 0622 06/03/18 0650  NA 139 137 137 138 134*  K 4.1 3.6 3.7 3.6 3.9  CL 98 98 97* 95* 94*  CO2 28 28 28 29 28   GLUCOSE 105* 95 85 115* 118*  BUN 36* 23 38* 24* 30*  CREATININE 8.81* 5.52* 7.36* 5.27* 7.16*  CALCIUM 8.5* 8.3* 8.2* 8.2* 8.1*  MG  --  1.5* 1.9 1.8  --   AST 26  --   --   --   --   ALT 16  --   --   --   --   ALKPHOS 95  --   --   --   --   BILITOT 0.8  --   --   --   --    ------------------------------------------------------------------------------------------------------------------ estimated creatinine clearance is 7.9 mL/min (A) (by C-G formula based on SCr of 7.16 mg/dL  (H)). ------------------------------------------------------------------------------------------------------------------ No results for input(s): TSH, T4TOTAL, T3FREE, THYROIDAB in the last 72 hours.  Invalid input(s): FREET3   Coagulation profile Recent Labs  Lab 05/28/18 0346 06/03/18 0650  INR 1.10 1.56   ------------------------------------------------------------------------------------------------------------------- No results for input(s): DDIMER in the last 72 hours. -------------------------------------------------------------------------------------------------------------------  Cardiac Enzymes Recent Labs  Lab 05/28/18 0112 06/03/18 0650  TROPONINI 0.06* 0.30*   ------------------------------------------------------------------------------------------------------------------ Invalid input(s): POCBNP  ---------------------------------------------------------------------------------------------------------------  Urinalysis    Component Value Date/Time   COLORURINE YELLOW (A) 09/19/2015 1105   APPEARANCEUR CLEAR (A) 09/19/2015 1105   LABSPEC 1.011 09/19/2015 1105   PHURINE 8.0 09/19/2015 1105   GLUCOSEU >500 (A) 09/19/2015 1105   HGBUR NEGATIVE 09/19/2015 1105   [place] 09/19/2015 1105   KETONESUR NEGATIVE 09/19/2015 1105   PROTEINUR >500 (A) 09/19/2015 1105   NITRITE NEGATIVE 09/19/2015 1105   LEUKOCYTESUR NEGATIVE 09/19/2015 1105     RADIOLOGY: Dg Chest 2 View  Result Date: 06/03/2018 CLINICAL DATA:  New atrial fibrillation EXAM: CHEST - 2 VIEW COMPARISON:  05/28/2018 chest radiograph. FINDINGS: Vascular stent overlies the left axillary region. Stable cardiomediastinal silhouette with moderate cardiomegaly. No pneumothorax. No pleural effusion. Mild pulmonary edema. Mild left basilar atelectasis. IMPRESSION: 1. Mild congestive heart failure. 2. Mild left basilar atelectasis. Electronically Signed   By: Ilona Sorrel M.D.   On: 06/03/2018 07:27    Dg Tibia/fibula Right  Result Date: 06/03/2018 CLINICAL DATA:  New atrial fibrillation, fall, right lower extremity pain EXAM: RIGHT TIBIA AND FIBULA - 2 VIEW COMPARISON:  None. FINDINGS: No fracture. No suspicious focal osseous lesions. Vascular stent is seen in right popliteal region. IMPRESSION: No fracture. Electronically Signed   By: Ilona Sorrel M.D.   On: 06/03/2018 07:28    EKG: Orders placed or performed during the hospital encounter of 06/03/18  . ED EKG  . ED EKG  . EKG 12-Lead  . EKG 12-Lead  . EKG 12-Lead  . EKG 12-Lead    IMPRESSION AND PLAN: Patient is a 73 year old African-American female with end-stage renal disease presenting with generalized weakness and hypotension  1.  Hypotension  etiology unclear I will discontinue her spironolactone Check cortisol level Check echo Start patient on midodrine  2.  Abnormal troponin with some EKG changes in the inferior leads we will cycle cardiac enzymes patient currently has no chest pain  3.  Right shin pain after fall x-rays negative supportive care   4.  End-stage renal disease nephrology has been notified  5.  Chronic diastolic CHF with some findings on chest x-ray consistent with CHF exasperation fluid removal per nephrology  6.  Atrial fibrillation patient was on previously Coumadin currently not on Coumadin will continue Coreg low-dose   7.  Miscellaneous heparin for DVT prophylaxis   All the records are reviewed and case discussed with ED provider. Management plans discussed with the patient, family and they are in agreement.  CODE STATUS: Code Status History    Date Active Date Inactive Code Status Order ID Comments User Context   05/28/2018 0858 05/31/2018 1847 Full Code [id number]  Harrie Foreman, MD ED   05/16/2018 0908 05/17/2018 2004 Full Code [id number]  Demetrios Loll, MD Inpatient   01/19/2017 2358 01/21/2017 1926 Full Code [id number]  Lance Coon, MD Inpatient   12/17/2016 0630 12/20/2016 0017 Full Code  [id number]  Saundra Shelling, MD Inpatient   11/16/2016 2051 11/17/2016 1531 Full Code [id number]  Fritzi Mandes, MD Inpatient   06/09/2016 1839 06/10/2016 1835 Full Code [id number]  Algernon Huxley, MD Inpatient   07/15/2015 1310 07/17/2015 1630 Full Code [id number]  Hillary Bow, MD ED   07/02/2015 1723 07/08/2015 2009 Full Code [id number]  Gladstone Lighter, MD Inpatient   04/12/2015 1303 04/15/2015 1800 DNR [id number]  Bettey Costa, MD Inpatient   03/26/2015 1605 04/01/2015 2024 Full Code [id number]  Gladstone Lighter, MD Inpatient   02/23/2015 0939 02/23/2015 1341 Full Code [id number]  Algernon Huxley, MD Inpatient       TOTAL TIME TAKING CARE OF THIS PATIENT: 55 minutes.    Dustin Flock M.D on 06/03/2018 at 9:16 AM  Between 7am to 6pm - Pager - [phone]  After 6pm go to [email or web address] - password [email or web address] [facility]  [employer] Office  [phone]  CC: Primary care physician; Patient, No Pcp Per

## 2018-06-03 NOTE — ED Notes (Signed)
Pink sleeve placed on patient's left arm. 

## 2018-06-03 NOTE — ED Notes (Signed)
Admitting MD at bedside.

## 2018-06-03 NOTE — ED Notes (Signed)
Report call attempted; Caryl Pina, RN to call this nurse back.

## 2018-06-03 NOTE — ED Notes (Addendum)
Dr. Jacqualine Code notified at this time of troponin 0.30

## 2018-06-03 NOTE — Progress Notes (Signed)
Pt unable to stand [place] enough for standing part of orthstatic 2/2 to complaints of pain in leg.

## 2018-06-03 NOTE — ED Notes (Signed)
Social work at bedside.  

## 2018-06-04 LAB — BASIC METABOLIC PANEL
Anion gap: 11 (ref 5–15)
BUN: 39 mg/dL — ABNORMAL HIGH (ref 8–23)
CALCIUM: 8.3 mg/dL — AB (ref 8.9–10.3)
CO2: 29 mmol/L (ref 22–32)
Chloride: 95 mmol/L — ABNORMAL LOW (ref 98–111)
Creatinine, Ser: 8.54 mg/dL — ABNORMAL HIGH (ref 0.44–1.00)
GFR calc Af Amer: 5 mL/min — ABNORMAL LOW (ref >60)
GFR, EST NON AFRICAN AMERICAN: 4 mL/min — AB (ref >60)
Glucose, Bld: 113 mg/dL — ABNORMAL HIGH (ref 70–99)
Potassium: 3.7 mmol/L (ref 3.5–5.1)
Sodium: 135 mmol/L (ref 135–145)

## 2018-06-04 LAB — CBC
HCT: 28.4 % — ABNORMAL LOW (ref 36.0–46.0)
Hemoglobin: 8.4 g/dL — ABNORMAL LOW (ref 12.0–15.0)
MCH: 29.4 pg (ref 26.0–34.0)
MCHC: 29.6 g/dL — ABNORMAL LOW (ref 30.0–36.0)
MCV: 99.3 fL (ref 80.0–100.0)
Platelets: 134 10*3/uL — ABNORMAL LOW (ref 150–400)
RBC: 2.86 MIL/uL — ABNORMAL LOW (ref 3.87–5.11)
RDW: 17.4 % — ABNORMAL HIGH (ref 11.5–15.5)
WBC: 7 10*3/uL (ref 4.0–10.5)
nRBC: 2.1 % — ABNORMAL HIGH (ref 0.0–0.2)

## 2018-06-04 LAB — PHOSPHORUS: PHOSPHORUS: 4.5 mg/dL (ref 2.5–4.6)

## 2018-06-04 MED ORDER — PENTAFLUOROPROP-TETRAFLUOROETH EX AERO
1.0000 "application " | INHALATION_SPRAY | CUTANEOUS | Status: DC | PRN
  Filled 2018-06-04: qty 30

## 2018-06-04 MED ORDER — LIDOCAINE HCL (PF) 1 % IJ SOLN
5.0000 mL | INTRAMUSCULAR | Status: DC | PRN
  Filled 2018-06-04: qty 5

## 2018-06-04 MED ORDER — SODIUM CHLORIDE 0.9 % IV SOLN: 100.0000 mL | INTRAVENOUS | Status: DC | PRN

## 2018-06-04 MED ORDER — EPOETIN ALFA 10000 UNIT/ML IJ SOLN
10000.0000 [IU] | INTRAMUSCULAR | Status: DC
  Administered 2018-06-04 – 2018-06-06 (×2): 10000 [IU] via INTRAVENOUS

## 2018-06-04 MED ORDER — ALTEPLASE 2 MG IJ SOLR: 2.0000 mg | Freq: Once | INTRAMUSCULAR | Status: DC | PRN

## 2018-06-04 MED ORDER — CHLORHEXIDINE GLUCONATE CLOTH 2 % EX PADS
6.0000 | MEDICATED_PAD | Freq: Every day | CUTANEOUS | Status: DC
  Administered 2018-06-04 – 2018-06-07 (×3): 6 via TOPICAL

## 2018-06-04 MED ORDER — LIDOCAINE-PRILOCAINE 2.5-2.5 % EX CREA
1.0000 "application " | TOPICAL_CREAM | CUTANEOUS | Status: DC | PRN
  Filled 2018-06-04: qty 5

## 2018-06-04 MED ORDER — HEPARIN SODIUM (PORCINE) 1000 UNIT/ML DIALYSIS
1000.0000 [IU] | INTRAMUSCULAR | Status: DC | PRN
  Filled 2018-06-04: qty 1

## 2018-06-04 NOTE — Progress Notes (Signed)
[facility] [place] NAME: Toni Carlson    MR#:  [id number]  DATE OF BIRTH:  May 06, 1946  SUBJECTIVE:  CHIEF COMPLAINT:   Chief Complaint  Patient presents with  . Dizziness  . Fall   No new complaint this morning.  No fevers.  No chest pain.  Scheduled for hemodialysis this morning.  REVIEW OF SYSTEMS:  Review of Systems  Constitutional: Negative for chills, fever and weight loss.  HENT: Negative for hearing loss and tinnitus.   Eyes: Negative for blurred vision, double vision and photophobia.  Respiratory: Negative for cough, hemoptysis and sputum production.   Cardiovascular: Negative for chest pain and palpitations.  Gastrointestinal: Negative for heartburn, nausea and vomiting.  Genitourinary: Negative for dysuria, frequency and urgency.  Musculoskeletal: Negative for myalgias and neck pain.       Mechanical fall at assisted living facility prior to admission  Skin: Negative for itching and rash.  Neurological: Negative for dizziness and headaches. Seizures:    Psychiatric/Behavioral: Negative for depression and hallucinations.    DRUG ALLERGIES:  No Known Allergies VITALS:  Blood pressure (!) 93/56, pulse 70, temperature (!) 97.5 F (36.4 C), temperature source Oral, resp. rate 20, height 5\' 7"  (1.702 m), weight 76.7 kg, SpO2 98 %. PHYSICAL EXAMINATION:  Physical Exam  Constitutional: She is oriented to person, place, and time and well-developed, well-nourished, and in no distress.  HENT:  Head: Normocephalic.  Right Ear: External ear normal.  Eyes: Pupils are equal, round, and reactive to light. Conjunctivae and EOM are normal.  Neck: Normal range of motion. Neck supple. No tracheal deviation present. No thyromegaly present.  Cardiovascular: Normal rate, regular rhythm and normal heart sounds.  Pulmonary/Chest: Effort normal and breath sounds normal. No respiratory distress. She has no wheezes.  Abdominal: Soft. Bowel  sounds are normal. She exhibits no distension. There is no abdominal tenderness.  Musculoskeletal: Normal range of motion.        General: No tenderness or edema.  Neurological: She is oriented to person, place, and time. No cranial nerve deficit.  Skin: Skin is warm. She is not diaphoretic. No erythema.  Psychiatric: Affect and judgment normal.   LABORATORY PANEL:  Female CBC Recent Labs  Lab 06/04/18 0412  WBC 7.0  HGB 8.4*  HCT 28.4*  PLT 134*   ------------------------------------------------------------------------------------------------------------------ Chemistries  Recent Labs  Lab 05/31/18 0622  06/04/18 0412  NA 138   < > 135  K 3.6   < > 3.7  CL 95*   < > 95*  CO2 29   < > 29  GLUCOSE 115*   < > 113*  BUN 24*   < > 39*  CREATININE 5.27*   < > 8.54*  CALCIUM 8.2*   < > 8.3*  MG 1.8  --   --    < > = values in this interval not displayed.   RADIOLOGY:  No results found. ASSESSMENT AND PLAN:   Patient is a 73 year old African-American female with end-stage renal disease presenting with generalized weakness and hypotension  1.  Hypotension; Likely related to blood pressure meds.  Discontinued CoregSpironolactone previously discontinued C.  ortisol level was normal at 18.9. Follow-up on recent 2D echocardiogram report when read. Clinically no evidence of infectious process. Patient already started on midodrine.  2.  Abnormal troponin with some EKG changes in the inferior leads we will cycle cardiac enzymes patient currently has no chest pain Likely due to underlying renal disease.  Patient remains completely asymptomatic  3.    Mechanical fall with no loss of consciousness.   Patient did not hit her head.  Right shin pain after fall x-rays negative  Physical therapy consulted to evaluate patient with recommendations.  Patient currently from an assisted living facility.  4.  End-stage renal disease Nephrology following.  Patient taken for hemodialysis  today.  5.  Chronic diastolic CHF  Clinically patient does not appear to be in exacerbation.  Stable.    6.  Atrial fibrillation patient was on previously Coumadin currently not on Coumadin  Coreg placed on hold today due to blood pressure being borderline.  Heart rate remains controlled  DVT prophylaxis; heparin  All the records are reviewed and case discussed with Care Management/Social Worker. Management plans discussed with the patient, and she is in agreement.  CODE STATUS: Full Code  TOTAL TIME TAKING CARE OF THIS PATIENT: 34 minutes.   More than 50% of the time was spent in counseling/coordination of care: YES  POSSIBLE D/C IN 1 DAYS, DEPENDING ON CLINICAL CONDITION and further recommendation from physical therapist.   Lysle Pearl.D on 06/04/2018 at 10:56 AM  Between 7am to 6pm - Pager - [phone]  After 6pm go to [email or web address] - Proofreader  [facility]  Office  [phone]  CC: Primary care physician; Patient, No Pcp Per  Note: This dictation was prepared with Dragon dictation along with smaller phrase technology. Any transcriptional errors that result from this process are unintentional.

## 2018-06-04 NOTE — Care Management (Signed)
Patient from [facility] and resented after having 2 falls at [facility].  Systolic blood pressure reported to have been in the 70's.  ESRD with chron HD at [facility]  Elvera Bicker aware.  Notified [facility] of admission and the need to confirm RN and PT. Patient has chronic oxygen at the facility with portable tanks through [facility]

## 2018-06-04 NOTE — Progress Notes (Signed)
PT Cancellation Note  Patient Details Name: Toni Carlson MRN: [id number] DOB: Oct 11, 1945   Cancelled Treatment:    Reason Eval/Treat Not Completed: Patient at procedure or test/unavailable.  Pt currently off floor at dialysis.  Will re-attempt PT evaluation at a later date/time.  Leitha Bleak, PT 06/04/18, 10:36 AM [phone]

## 2018-06-04 NOTE — Progress Notes (Signed)
HD tx end    06/04/18 1342  Vital Signs  Pulse Rate 70  Pulse Rate Source Monitor  Resp 16  BP 101/66  BP Location Right Arm  BP Method Automatic  Patient Position (if appropriate) Lying  Oxygen Therapy  SpO2 96 %  O2 Device Nasal Cannula  O2 Flow Rate (L/min) 2 L/min  During Hemodialysis Assessment  Dialysis Fluid Bolus Normal Saline  Bolus Amount (mL) 250 mL  Intra-Hemodialysis Comments Tx completed

## 2018-06-04 NOTE — Progress Notes (Signed)
Post HD assessment. Pt tolerated tx well without c/o or complications. Net UF 1012, goal met.    06/04/18 1346  Vital Signs  Temp (!) 97.5 F (36.4 C)  Temp Source Oral  Pulse Rate 76  Pulse Rate Source Monitor  Resp 15  BP (!) 121/59  BP Location Right Arm  BP Method Automatic  Patient Position (if appropriate) Lying  Oxygen Therapy  SpO2 98 %  O2 Device Nasal Cannula  O2 Flow Rate (L/min) 2 L/min  Dialysis Weight  Weight 75.2 kg  Type of Weight Post-Dialysis  Post-Hemodialysis Assessment  Rinseback Volume (mL) 250 mL  KECN 65.6 V  Dialyzer Clearance Lightly streaked  Duration of HD Treatment -hour(s) 3 hour(s)  Hemodialysis Intake (mL) 500 mL  UF Total -Machine (mL) 1512 mL  Net UF (mL) 1012 mL  Tolerated HD Treatment Yes  AVG/AVF Arterial Site Held (minutes) 10 minutes  AVG/AVF Venous Site Held (minutes) 10 minutes  Education / Care Plan  Dialysis Education Provided Yes  Documented Education in Care Plan Yes  Fistula / Graft Left Upper arm  No Placement Date or Time found.   Placed prior to admission: Yes  Orientation: Left  Access Location: Upper arm  Site Condition No complications  Fistula / Graft Assessment Present;Thrill;Bruit  Status Deaccessed  Drainage Description None

## 2018-06-04 NOTE — Progress Notes (Signed)
Pre HD assessment    06/04/18 1028  Vital Signs  Temp (!) 97.5 F (36.4 C)  Temp Source Oral  Pulse Rate 80  Pulse Rate Source Monitor  Resp 18  BP (!) 89/54  BP Location Right Arm  BP Method Automatic  Patient Position (if appropriate) Lying  Oxygen Therapy  SpO2 100 %  O2 Device Nasal Cannula  O2 Flow Rate (L/min) 2 L/min  Pain Assessment  Pain Scale 0-10  Pain Score 0  Dialysis Weight  Weight 76.7 kg  Type of Weight Pre-Dialysis  Time-Out for Hemodialysis  What Procedure? HD  Pt Identifiers(min of two) First/Last Name;MRN/Account#  Correct Site? Yes  Correct Side? Yes  Correct Procedure? Yes  Consents Verified? Yes  Rad Studies Available? N/A  Safety Precautions Reviewed? Yes  Engineer, civil (consulting) Number  (1A)  Station Number 4  UF/Alarm Test Passed  Conductivity: Meter 14.2  Conductivity: Machine  14.1  pH 7.6  Reverse Osmosis main  Normal Saline Lot Number [id number]  Dialyzer Lot Number [id number]  Disposable Set Lot Number [id number]  Machine Temperature 98.6 F (37 C)  Musician and Audible Yes  Blood Lines Intact and Secured Yes  Pre Treatment Patient Checks  Vascular access used during treatment Fistula  Hepatitis B Surface Antigen Results Negative  Date Hepatitis B Surface Antigen Drawn 05/28/18  Hepatitis B Surface Antibody  (>10)  Date Hepatitis B Surface Antibody Drawn 07/05/17  Hemodialysis Consent Verified Yes  Hemodialysis Standing Orders Initiated Yes  ECG (Telemetry) Monitor On Yes  Prime Ordered Normal Saline  Length of  DialysisTreatment -hour(s) 3 Hour(s)  Dialyzer Elisio 17H NR  Dialysate 2K, 2.5 Ca  Dialysis Anticoagulant None  Dialysate Flow Ordered 800  Blood Flow Rate Ordered 400 mL/min  Ultrafiltration Goal 1 Liters  Pre Treatment Labs Phosphorus;Other (Comment) (PTH)  Dialysis Blood Pressure Support Ordered Normal Saline  Education / Care Plan  Dialysis Education Provided Yes  Documented Education in Care Plan Yes   Fistula / Graft Left Upper arm  No Placement Date or Time found.   Placed prior to admission: Yes  Orientation: Left  Access Location: Upper arm  Site Condition No complications  Fistula / Graft Assessment Present;Thrill;Bruit  Drainage Description None

## 2018-06-04 NOTE — Progress Notes (Signed)
Post HD assessment    06/04/18 1345  Neurological  Level of Consciousness Alert  Orientation Level Oriented X4  Respiratory  Respiratory Pattern Regular;Unlabored  Chest Assessment Chest expansion symmetrical  Cardiac  Pulse Irregular  ECG Monitor Yes  Cardiac Rhythm Atrial fibrillation  Vascular  R Radial Pulse +2  L Radial Pulse +2  Edema Generalized  Integumentary  Integumentary (WDL) X  Skin Color Appropriate for ethnicity  Musculoskeletal  Musculoskeletal (WDL) X  Generalized Weakness Yes  Assistive Device None  GU Assessment  Genitourinary (WDL) X  Genitourinary Symptoms  (HD)  Psychosocial  Psychosocial (WDL) WDL

## 2018-06-04 NOTE — Progress Notes (Signed)
[facility]  ROUNDING NOTE   Subjective:   Seen and examined on hemodialysis. Tolerating treatment well. UF of 1 liter.     HEMODIALYSIS FLOWSHEET:  Blood Flow Rate (mL/min): 400 mL/min Arterial Pressure (mmHg): -190 mmHg Venous Pressure (mmHg): 170 mmHg Transmembrane Pressure (mmHg): 60 mmHg Ultrafiltration Rate (mL/min): 450 mL/min Dialysate Flow Rate (mL/min): 800 ml/min Conductivity: Machine : 14 Conductivity: Machine : 14 Dialysis Fluid Bolus: Normal Saline Bolus Amount (mL): 250 mL     Objective:  Vital signs in last 24 hours:  Temp:  [97.4 F (36.3 C)-97.7 F (36.5 C)] 97.5 F (36.4 C) (01/20 1028) Pulse Rate:  [61-95] 63 (01/20 1200) Resp:  [14-21] 14 (01/20 1200) BP: (89-122)/(50-74) 105/60 (01/20 1200) SpO2:  [95 %-100 %] 98 % (01/20 1200) Weight:  [74.7 kg-76.7 kg] 76.7 kg (01/20 1028)  Weight change: -3.247 kg Filed Weights   06/03/18 1112 06/04/18 0551 06/04/18 1028  Weight: 74.8 kg 74.7 kg 76.7 kg    Intake/Output: I/O last 3 completed shifts: In: 243 [P.O.:240; I.V.:3] Out: 0    Intake/Output this shift:  No intake/output data recorded.  Physical Exam: General: No acute distress  Head: Normocephalic, atraumatic. Moist oral mucosal membranes  Eyes: Anicteric  Neck: Supple, trachea midline  Lungs:  clear  Heart: S1S2 no rubs  Abdomen:  Soft, nontender, bowel sounds present  Extremities: No peripheral edema.  Neurologic: Awake, alert, following commands  Skin: No lesions  Access: LUE AVF    Basic Metabolic Panel: Recent Labs  Lab 05/29/18 0615 05/30/18 0404 05/30/18 1036 05/31/18 0622 06/03/18 0650 06/04/18 0412  NA 137 137  --  138 134* 135  K 3.6 3.7  --  3.6 3.9 3.7  CL 98 97*  --  95* 94* 95*  CO2 28 28  --  29 28 29   GLUCOSE 95 85  --  115* 118* 113*  BUN 23 38*  --  24* 30* 39*  CREATININE 5.52* 7.36*  --  5.27* 7.16* 8.54*  CALCIUM 8.3* 8.2*  --  8.2* 8.1* 8.3*  MG 1.5* 1.9  --  1.8  --   --   PHOS 3.0  4.6 4.6 4.2  --  4.5    Liver Function Tests: No results for input(s): AST, ALT, ALKPHOS, BILITOT, PROT, ALBUMIN in the last 168 hours. No results for input(s): LIPASE, AMYLASE in the last 168 hours. No results for input(s): AMMONIA in the last 168 hours.  CBC: Recent Labs  Lab 05/29/18 0615 05/30/18 0404 06/03/18 0650 06/04/18 0412  WBC 8.8 5.9 10.9* 7.0  HGB 8.2* 8.5* 8.6* 8.4*  HCT 27.4* 28.3* 28.9* 28.4*  MCV 99.3 97.6 99.3 99.3  PLT 135* 127* 156 134*    Cardiac Enzymes: Recent Labs  Lab 06/03/18 0650 06/03/18 1122 06/03/18 1640 06/03/18 2245  TROPONINI 0.30* 0.37* 0.33* 0.33*    BNP: Invalid input(s): POCBNP  CBG: No results for input(s): GLUCAP in the last 168 hours.  Microbiology: Results for orders placed or performed during the hospital encounter of 05/28/18  Group A Strep by PCR     Status: None   Collection Time: 05/28/18  1:24 AM  Result Value Ref Range Status   Group A Strep by PCR NOT DETECTED NOT DETECTED Final    Comment: Performed at [facility], [address] [place], [place] [address]  Blood Culture (routine x 2)     Status: None   Collection Time: 05/28/18  3:46 AM  Result Value Ref Range  Status   Specimen Description BLOOD RIGHT HAND  Final   Special Requests   Final    BOTTLES DRAWN AEROBIC AND ANAEROBIC Blood Culture adequate volume   Culture   Final    NO GROWTH 5 DAYS Performed at [facility], [address] [place], [place] [address]    Report Status 06/02/2018 FINAL  Final  Blood Culture (routine x 2)     Status: None   Collection Time: 05/28/18  3:47 AM  Result Value Ref Range Status   Specimen Description BLOOD RIGHT ARM  Final   Special Requests   Final    BOTTLES DRAWN AEROBIC AND ANAEROBIC Blood Culture results may not be optimal due to an excessive volume of blood received in culture bottles   Culture   Final    NO GROWTH 5 DAYS Performed at [facility], [address],  [place], [place] [address]    Report Status 06/02/2018 FINAL  Final    Coagulation Studies: Recent Labs    06/03/18 0650  LABPROT 18.5*  INR 1.56    Urinalysis: No results for input(s): COLORURINE, LABSPEC, PHURINE, GLUCOSEU, HGBUR, BILIRUBINUR, KETONESUR, PROTEINUR, UROBILINOGEN, NITRITE, LEUKOCYTESUR in the last 72 hours.  Invalid input(s): APPERANCEUR    Imaging: Dg Chest 2 View  Result Date: 06/03/2018 CLINICAL DATA:  New atrial fibrillation EXAM: CHEST - 2 VIEW COMPARISON:  05/28/2018 chest radiograph. FINDINGS: Vascular stent overlies the left axillary region. Stable cardiomediastinal silhouette with moderate cardiomegaly. No pneumothorax. No pleural effusion. Mild pulmonary edema. Mild left basilar atelectasis. IMPRESSION: 1. Mild congestive heart failure. 2. Mild left basilar atelectasis. Electronically Signed   By: Ilona Sorrel M.D.   On: 06/03/2018 07:27   Dg Tibia/fibula Right  Result Date: 06/03/2018 CLINICAL DATA:  New atrial fibrillation, fall, right lower extremity pain EXAM: RIGHT TIBIA AND FIBULA - 2 VIEW COMPARISON:  None. FINDINGS: No fracture. No suspicious focal osseous lesions. Vascular stent is seen in right popliteal region. IMPRESSION: No fracture. Electronically Signed   By: Ilona Sorrel M.D.   On: 06/03/2018 07:28     Medications:   . sodium chloride    . sodium chloride    . sodium chloride     . atorvastatin  40 mg Oral QHS  . calcium acetate  1,334 mg Oral TID WC  . calcium carbonate  1 tablet Oral TID WC  . Chlorhexidine Gluconate Cloth  6 each Topical Q0600  . clopidogrel  75 mg Oral Daily  . escitalopram  10 mg Oral Daily  . gabapentin  300 mg Oral QHS  . guaiFENesin  600 mg Oral BID  . heparin  5,000 Units Subcutaneous Q8H  . loratadine  10 mg Oral Daily  . midodrine  5 mg Oral TID WC  . mupirocin ointment  1 application Nasal BID  . pantoprazole  40 mg Oral BID  . sodium chloride flush  3 mL Intravenous Once  . sodium chloride flush  3 mL  Intravenous Q12H  . traZODone  50 mg Oral QHS   sodium chloride, sodium chloride, sodium chloride, acetaminophen **OR** acetaminophen, alteplase, alum & mag hydroxide-simeth, bisacodyl, chlorpheniramine-HYDROcodone, guaifenesin, heparin, HYDROcodone-acetaminophen, lidocaine (PF), lidocaine-prilocaine, loperamide, ondansetron **OR** ondansetron (ZOFRAN) IV, ondansetron, pentafluoroprop-tetrafluoroeth, polyethylene glycol, senna-docusate, sodium chloride flush, TRIPLE ANTIBIOTIC, trolamine salicylate  Assessment/ Plan:  73 y.o. female with end stage renal disease on hemodialysis, hypertension, coronary artery disease, CVA, pulmonary hypertension, carotid artery stenosis, colon mass, atrial fibrillation, depression, hyperlipidemia  [facility] MWF [address] Left  AVF 74.5kg. 133min  1. End stage renal disease: Seen and examined on hemodialysis. 2K bath.  Tolerating treatment well. Continue MWF schedule.   2. Anemia of chronic kidney disease: Hemoglobin 8.4 - EPO with HD treatment.     3. Secondary Hyperparathyroidism:  -  calcium acetate.  4. Hypertension:  Hypotensive -  Hold carvedilol.    LOS: 1 Konner Warrior 1/20/202012:11 PM

## 2018-06-04 NOTE — Progress Notes (Signed)
HD tx start    06/04/18 1036  Vital Signs  Pulse Rate 79  Pulse Rate Source Monitor  Resp (!) 21  BP 97/73  BP Location Right Arm  BP Method Automatic  Patient Position (if appropriate) Lying  Oxygen Therapy  SpO2 98 %  O2 Device Nasal Cannula  O2 Flow Rate (L/min) 2 L/min  During Hemodialysis Assessment  Blood Flow Rate (mL/min) 400 mL/min  Arterial Pressure (mmHg) -160 mmHg  Venous Pressure (mmHg) 170 mmHg  Transmembrane Pressure (mmHg) 60 mmHg  Ultrafiltration Rate (mL/min) 60 mL/min  Dialysate Flow Rate (mL/min) 800 ml/min  Conductivity: Machine  14.1  HD Safety Checks Performed Yes  Dialysis Fluid Bolus Normal Saline  Bolus Amount (mL) 250 mL  Intra-Hemodialysis Comments Tx initiated  Fistula / Graft Left Upper arm  No Placement Date or Time found.   Placed prior to admission: Yes  Orientation: Left  Access Location: Upper arm  Status Accessed  Needle Size 15

## 2018-06-04 NOTE — Progress Notes (Addendum)
Patient refusing heparin, believing it to be the culprit in her hypotension. Also refusing Aspercreme because she does not want to put it on her neck.  This RN attempted to educate patient in both cases, but the patient refused to believe me, even after explaining the doctor ordered these for her. 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 This RN had to discard entire heparin dose as it was already drawn up.  Patient also complaining of the noise outside and said we are all acting crazy; patient was referring to the tube station.  This RN attempted to explain the tube station, but patient did not listen.  This RN chose not to engage patient further as everything became an argument to her.  Patient sitting on bed in no distress.

## 2018-06-04 NOTE — Progress Notes (Signed)
Pre HD assessment    06/04/18 1029  Neurological  Level of Consciousness Alert  Orientation Level Oriented X4  Respiratory  Respiratory Pattern Regular;Unlabored  Chest Assessment Chest expansion symmetrical  Cardiac  Pulse Irregular  ECG Monitor Yes  Cardiac Rhythm Atrial fibrillation  Vascular  R Radial Pulse +2  L Radial Pulse +2  Edema Generalized  Integumentary  Integumentary (WDL) X  Skin Color Appropriate for ethnicity  Musculoskeletal  Musculoskeletal (WDL) X  Generalized Weakness Yes  Assistive Device None  GU Assessment  Genitourinary (WDL) X  Genitourinary Symptoms  (HD)  Psychosocial  Psychosocial (WDL) WDL

## 2018-06-04 NOTE — Plan of Care (Signed)
  Problem: Health Behavior/Discharge Planning: Goal: Ability to manage health-related needs will improve Outcome: Progressing Note:  Patient had her normally scheduled HD session today (M/W/F). They removed 1 L of fluid. Patient tolerated well. Will continue to monitor renal function. Toni Carlson [facility]

## 2018-06-05 LAB — BASIC METABOLIC PANEL
Anion gap: 9 (ref 5–15)
BUN: 24 mg/dL — ABNORMAL HIGH (ref 8–23)
CO2: 31 mmol/L (ref 22–32)
Calcium: 8.7 mg/dL — ABNORMAL LOW (ref 8.9–10.3)
Chloride: 96 mmol/L — ABNORMAL LOW (ref 98–111)
Creatinine, Ser: 5.83 mg/dL — ABNORMAL HIGH (ref 0.44–1.00)
GFR calc Af Amer: 8 mL/min — ABNORMAL LOW (ref >60)
GFR calc non Af Amer: 7 mL/min — ABNORMAL LOW (ref >60)
Glucose, Bld: 114 mg/dL — ABNORMAL HIGH (ref 70–99)
Potassium: 3.6 mmol/L (ref 3.5–5.1)
Sodium: 136 mmol/L (ref 135–145)

## 2018-06-05 LAB — CBC
HEMATOCRIT: 31.5 % — AB (ref 36.0–46.0)
Hemoglobin: 9.2 g/dL — ABNORMAL LOW (ref 12.0–15.0)
MCH: 29.9 pg (ref 26.0–34.0)
MCHC: 29.2 g/dL — ABNORMAL LOW (ref 30.0–36.0)
MCV: 102.3 fL — ABNORMAL HIGH (ref 80.0–100.0)
Platelets: 165 10*3/uL (ref 150–400)
RBC: 3.08 MIL/uL — ABNORMAL LOW (ref 3.87–5.11)
RDW: 18.1 % — ABNORMAL HIGH (ref 11.5–15.5)
WBC: 8.5 10*3/uL (ref 4.0–10.5)
nRBC: 2.7 % — ABNORMAL HIGH (ref 0.0–0.2)

## 2018-06-05 LAB — PHOSPHORUS: Phosphorus: 3 mg/dL (ref 2.5–4.6)

## 2018-06-05 LAB — MAGNESIUM: Magnesium: 1.8 mg/dL (ref 1.7–2.4)

## 2018-06-05 LAB — PARATHYROID HORMONE, INTACT (NO CA): PTH: 566 pg/mL — ABNORMAL HIGH (ref 15–65)

## 2018-06-05 NOTE — Progress Notes (Signed)
[facility]  ROUNDING NOTE   Subjective:   Hemodialysis treatment yesterday. Tolerated treatment well. UF of 1 liter.   Objective:  Vital signs in last 24 hours:  Temp:  [97.5 F (36.4 C)-98.5 F (36.9 C)] 98.5 F (36.9 C) (01/21 0735) Pulse Rate:  [66-91] 91 (01/21 0735) Resp:  [14-19] 19 (01/21 0735) BP: (77-126)/(37-97) 84/48 (01/21 0735) SpO2:  [90 %-100 %] 96 % (01/21 0805) Weight:  [74.6 kg-75.2 kg] 74.6 kg (01/21 0339)  Weight change: 1.947 kg Filed Weights   06/04/18 1028 06/04/18 1346 06/05/18 0339  Weight: 76.7 kg 75.2 kg 74.6 kg    Intake/Output: I/O last 3 completed shifts: In: 3 [I.V.:3] Out: 1012 [Other:1012]   Intake/Output this shift:  No intake/output data recorded.  Physical Exam: General: No acute distress  Head: Normocephalic, atraumatic. Moist oral mucosal membranes  Eyes: Anicteric  Neck: Supple, trachea midline  Lungs:  clear  Heart: S1S2 no rubs  Abdomen:  Soft, nontender, bowel sounds present  Extremities: No peripheral edema.  Neurologic: Awake, alert, following commands  Skin: No lesions  Access: LUE AVF    Basic Metabolic Panel: Recent Labs  Lab 05/30/18 0404 05/30/18 1036 05/31/18 0622 06/03/18 0650 06/04/18 0412 06/05/18 0431  NA 137  --  138 134* 135 136  K 3.7  --  3.6 3.9 3.7 3.6  CL 97*  --  95* 94* 95* 96*  CO2 28  --  29 28 29 31   GLUCOSE 85  --  115* 118* 113* 114*  BUN 38*  --  24* 30* 39* 24*  CREATININE 7.36*  --  5.27* 7.16* 8.54* 5.83*  CALCIUM 8.2*  --  8.2* 8.1* 8.3* 8.7*  MG 1.9  --  1.8  --   --  1.8  PHOS 4.6 4.6 4.2  --  4.5 3.0    Liver Function Tests: No results for input(s): AST, ALT, ALKPHOS, BILITOT, PROT, ALBUMIN in the last 168 hours. No results for input(s): LIPASE, AMYLASE in the last 168 hours. No results for input(s): AMMONIA in the last 168 hours.  CBC: Recent Labs  Lab 05/30/18 0404 06/03/18 0650 06/04/18 0412 06/05/18 0431  WBC 5.9 10.9* 7.0 8.5  HGB 8.5* 8.6*  8.4* 9.2*  HCT 28.3* 28.9* 28.4* 31.5*  MCV 97.6 99.3 99.3 102.3*  PLT 127* 156 134* 165    Cardiac Enzymes: Recent Labs  Lab 06/03/18 0650 06/03/18 1122 06/03/18 1640 06/03/18 2245  TROPONINI 0.30* 0.37* 0.33* 0.33*    BNP: Invalid input(s): POCBNP  CBG: No results for input(s): GLUCAP in the last 168 hours.  Microbiology: Results for orders placed or performed during the hospital encounter of 05/28/18  Group A Strep by PCR     Status: None   Collection Time: 05/28/18  1:24 AM  Result Value Ref Range Status   Group A Strep by PCR NOT DETECTED NOT DETECTED Final    Comment: Performed at [facility], [address] [place], [place] [address]  Blood Culture (routine x 2)     Status: None   Collection Time: 05/28/18  3:46 AM  Result Value Ref Range Status   Specimen Description BLOOD RIGHT HAND  Final   Special Requests   Final    BOTTLES DRAWN AEROBIC AND ANAEROBIC Blood Culture adequate volume   Culture   Final    NO GROWTH 5 DAYS Performed at [facility], [address] [place], [place] [address]    Report Status 06/02/2018 FINAL  Final  Blood Culture (routine x 2)     Status: None   Collection Time: 05/28/18  3:47 AM  Result Value Ref Range Status   Specimen Description BLOOD RIGHT ARM  Final   Special Requests   Final    BOTTLES DRAWN AEROBIC AND ANAEROBIC Blood Culture results may not be optimal due to an excessive volume of blood received in culture bottles   Culture   Final    NO GROWTH 5 DAYS Performed at [facility], [address], [place], [place] [address]    Report Status 06/02/2018 FINAL  Final    Coagulation Studies: Recent Labs    06/03/18 0650  LABPROT 18.5*  INR 1.56    Urinalysis: No results for input(s): COLORURINE, LABSPEC, PHURINE, GLUCOSEU, HGBUR, BILIRUBINUR, KETONESUR, PROTEINUR, UROBILINOGEN, NITRITE, LEUKOCYTESUR in the last 72 hours.  Invalid input(s): APPERANCEUR    Imaging: No results  found.   Medications:   . sodium chloride     . atorvastatin  40 mg Oral QHS  . calcium acetate  1,334 mg Oral TID WC  . calcium carbonate  1 tablet Oral TID WC  . Chlorhexidine Gluconate Cloth  6 each Topical Q0600  . clopidogrel  75 mg Oral Daily  . [START ON 06/06/2018] epoetin (EPOGEN/PROCRIT) injection  10,000 Units Intravenous Q M,W,F-HD  . escitalopram  10 mg Oral Daily  . gabapentin  300 mg Oral QHS  . guaiFENesin  600 mg Oral BID  . loratadine  10 mg Oral Daily  . midodrine  5 mg Oral TID WC  . mupirocin ointment  1 application Nasal BID  . pantoprazole  40 mg Oral BID  . sodium chloride flush  3 mL Intravenous Once  . sodium chloride flush  3 mL Intravenous Q12H  . traZODone  50 mg Oral QHS   sodium chloride, acetaminophen **OR** acetaminophen, alum & mag hydroxide-simeth, bisacodyl, chlorpheniramine-HYDROcodone, guaifenesin, HYDROcodone-acetaminophen, loperamide, ondansetron **OR** ondansetron (ZOFRAN) IV, ondansetron, polyethylene glycol, senna-docusate, sodium chloride flush, TRIPLE ANTIBIOTIC, trolamine salicylate  Assessment/ Plan:  73 y.o. female with end stage renal disease on hemodialysis, hypertension, coronary artery disease, CVA, pulmonary hypertension, carotid artery stenosis, colon mass, atrial fibrillation, depression, hyperlipidemia  [facility] MWF [address] Left AVF 74.5kg. 147min  1. End stage renal disease: 2K bath.  Tolerated treatment well. Continue MWF schedule.   2. Anemia of chronic kidney disease: Hemoglobin 9.2 - EPO with HD treatment.     3. Secondary Hyperparathyroidism:  -  calcium acetate.  4. Hypertension:  Hypotensive -  Hold carvedilol and spironolactone. Started midodrine. Asymptomatic.  Consider Echocardiogram     LOS: 2 Toni Carlson 1/21/202012:37 PM

## 2018-06-05 NOTE — Progress Notes (Signed)
[facility] NAME: Pietrina Jagodzinski    MR#:  [id number]  DATE OF BIRTH:  1945-12-14  SUBJECTIVE:  CHIEF COMPLAINT:   Chief Complaint  Patient presents with  . Dizziness  . Fall   No new complaint this morning.  Patient reports some intermittent pain on her leg where she fell.  X-rays were negative for any fractures.  No chest pain.  Patient refusing heparin injections.  Patient understands the risk of not being on heparin for DVT prophylaxis.  Claims it makes her blood level drop and causes her blood pressure to be low.  Agreeable to SCDs which were ordered. Physical therapy to evaluate patient with recommendations later today.   REVIEW OF SYSTEMS:  Review of Systems  Constitutional: Negative for chills, fever and weight loss.  HENT: Negative for hearing loss and tinnitus.   Eyes: Negative for blurred vision, double vision and photophobia.  Respiratory: Negative for cough, hemoptysis and sputum production.   Cardiovascular: Negative for chest pain and palpitations.  Gastrointestinal: Negative for heartburn, nausea and vomiting.  Genitourinary: Negative for dysuria, frequency and urgency.  Musculoskeletal: Negative for myalgias and neck pain.       Mechanical fall at assisted living facility prior to admission  Skin: Negative for itching and rash.  Neurological: Negative for dizziness and headaches. Seizures:    Psychiatric/Behavioral: Negative for depression and hallucinations.    DRUG ALLERGIES:  No Known Allergies VITALS:  Blood pressure (!) 84/48, pulse 91, temperature 98.5 F (36.9 C), temperature source Oral, resp. rate 19, height 5\' 7"  (1.702 m), weight 74.6 kg, SpO2 96 %. PHYSICAL EXAMINATION:  Physical Exam  Constitutional: She is oriented to person, place, and time and well-developed, well-nourished, and in no distress.  HENT:  Head: Normocephalic.  Right Ear: External ear normal.  Eyes: Pupils are equal, round, and  reactive to light. Conjunctivae and EOM are normal.  Neck: Normal range of motion. Neck supple. No tracheal deviation present. No thyromegaly present.  Cardiovascular: Normal rate, regular rhythm and normal heart sounds.  Pulmonary/Chest: Effort normal and breath sounds normal. No respiratory distress. She has no wheezes.  Abdominal: Soft. Bowel sounds are normal. She exhibits no distension. There is no abdominal tenderness.  Musculoskeletal: Normal range of motion.        General: No tenderness or edema.  Neurological: She is oriented to person, place, and time. No cranial nerve deficit.  Skin: Skin is warm. She is not diaphoretic. No erythema.  Psychiatric: Affect and judgment normal.   LABORATORY PANEL:  Female CBC Recent Labs  Lab 06/05/18 0431  WBC 8.5  HGB 9.2*  HCT 31.5*  PLT 165   ------------------------------------------------------------------------------------------------------------------ Chemistries  Recent Labs  Lab 06/05/18 0431  NA 136  K 3.6  CL 96*  CO2 31  GLUCOSE 114*  BUN 24*  CREATININE 5.83*  CALCIUM 8.7*  MG 1.8   RADIOLOGY:  No results found. ASSESSMENT AND PLAN:   Patient is a 73 year old African-American female with end-stage renal disease presenting with generalized weakness and hypotension  1.  Hypotension; Likely related to blood pressure meds.  Coreg and spironolactone previously discontinued Cortisol level was normal at 18.9. Follow-up on recent 2D echocardiogram report when read. Clinically no evidence of infectious process. Patient already started on midodrine.  Blood pressure noted to still be borderline low this morning at 84/48.  Patient currently asymptomatic.  Will place on gentle IV fluid hydration if patient becomes symptomatic.Marland Kitchen  2.  Abnormal troponin with some EKG changes in the inferior leads we will cycle cardiac enzymes patient currently has no chest pain Likely due to underlying renal disease.  Patient remains  completely asymptomatic  3.    Mechanical fall with no loss of consciousness.   Patient did not hit her head.  Right shin pain after fall x-rays negative  Physical therapy consulted to evaluate patient with recommendations.  Patient currently from an assisted living facility. Follow-up on physical therapy recommendations after evaluation  4.  End-stage renal disease Nephrology following.  Continue inpatient hemodialysis  5.  Chronic diastolic CHF  Clinically patient does not appear to be in exacerbation.  Stable.    6.  Atrial fibrillation patient was on previously Coumadin currently not on Coumadin  Coreg placed on hold today due to blood pressure being borderline.  Heart rate remains controlled  DVT prophylaxis; heparin  All the records are reviewed and case discussed with Care Management/Social Worker. Management plans discussed with the patient, and she is in agreement.  CODE STATUS: Full Code  TOTAL TIME TAKING CARE OF THIS PATIENT: 33 minutes.   More than 50% of the time was spent in counseling/coordination of care: YES  POSSIBLE D/C IN 1 DAYS, DEPENDING ON CLINICAL CONDITION and further recommendation from physical therapist.   Lysle Pearl.D on 06/05/2018 at 11:50 AM  Between 7am to 6pm - Pager - [phone]  After 6pm go to [email or web address] - Proofreader  [facility]  Office  [phone]  CC: Primary care physician; Patient, No Pcp Per  Note: This dictation was prepared with Dragon dictation along with smaller phrase technology. Any transcriptional errors that result from this process are unintentional.

## 2018-06-05 NOTE — Progress Notes (Signed)
PT Cancellation Note  Patient Details Name: Toni Carlson MRN: [id number] DOB: [birth year]   Cancelled Treatment:    Reason Eval/Treat Not Completed: (Patient declined, states she did not sleep at all last night, would like to wait until later. ) Will re-attempt PT eval this afternoon.    Herby Amick 06/05/2018, 10:57 AM

## 2018-06-05 NOTE — Evaluation (Signed)
Physical Therapy Evaluation Patient Details Name: Toni Carlson MRN: [id number] DOB: [birth year] Today's Date: 06/05/2018   History of Present Illness  Patient is a 73 year old female admitted with hypotension. BP this morning 84/48. PMH to include ESRD, on hemodialysis,   Clinical Impression  Patient sleeping upon arrival, very groggy upon awakening, difficulty with speech, agrees to try to work with PT. Patient able to perform supine >< with min guard assist and verbal cues for safety. Patient reporting "I don't know what's wrong with me." Reports not feeling well, no appetite. Patient will benefit from skilled PT follow up to assess transfers and gait to determine discharge disposition. Patient lives at [facility].      Follow Up Recommendations SNF    Equipment Recommendations  None recommended by PT    Recommendations for Other Services       Precautions / Restrictions Precautions Precautions: Fall Restrictions Weight Bearing Restrictions: No      Mobility  Bed Mobility Overal bed mobility: Modified Independent;Needs Assistance Bed Mobility: Supine to Sit;Sit to Supine     Supine to sit: Min guard Sit to supine: Min guard   General bed mobility comments: requires use of rail and increased time  Transfers                 General transfer comment: not attempted  Ambulation/Gait             General Gait Details: not attempted(patient keeps saying I dont know what's wrong with me. Seems very lethargic. Has been sleeping all morning. )  Stairs            Wheelchair Mobility    Modified Rankin (Stroke Patients Only)       Balance Overall balance assessment: Needs assistance Sitting-balance support: Single extremity supported Sitting balance-Leahy Scale: Fair         Standing balance comment: not assessed                             Pertinent Vitals/Pain Pain Assessment: No/denies pain    Home Living  Family/patient expects to be discharged to:: Assisted living               Home Equipment: Walker - 2 wheels      Prior Function           Comments: Patient reports she uses walker for mobility      Hand Dominance        Extremity/Trunk Assessment   Upper Extremity Assessment Upper Extremity Assessment: Overall WFL for tasks assessed;Generalized weakness    Lower Extremity Assessment Lower Extremity Assessment: Generalized weakness;Overall [facility] for tasks assessed       Communication   Communication: Expressive difficulties;Other (comment)(garbled speech)  Cognition Arousal/Alertness: Lethargic Behavior During Therapy: WFL for tasks assessed/performed Overall Cognitive Status: No family/caregiver present to determine baseline cognitive functioning                                        General Comments      Exercises     Assessment/Plan    PT Assessment Patient needs continued PT services  PT Problem List Decreased strength;Decreased mobility;Decreased activity tolerance;Decreased balance       PT Treatment Interventions Gait training;Therapeutic activities;Therapeutic exercise;Neuromuscular re-education;Balance training;Patient/family education;Functional mobility training    PT Goals (Current goals can  be found in the Care Plan section)  Acute Rehab PT Goals Patient Stated Goal: to return home PT Goal Formulation: With patient Time For Goal Achievement: 06/19/18 Potential to Achieve Goals: Fair    Frequency Min 2X/week   Barriers to discharge        Co-evaluation               AM-PAC PT "6 Clicks" Mobility  Outcome Measure Help needed turning from your back to your side while in a flat bed without using bedrails?: A Little Help needed moving from lying on your back to sitting on the side of a flat bed without using bedrails?: A Little Help needed moving to and from a bed to a chair (including a wheelchair)?: A  Lot Help needed standing up from a chair using your arms (e.g., wheelchair or bedside chair)?: A Lot Help needed to walk in hospital room?: A Lot Help needed climbing 3-5 steps with a railing? : A Lot 6 Click Score: 14    End of Session   Activity Tolerance: Patient limited by fatigue;Patient limited by lethargy Patient left: in bed;with bed alarm set Nurse Communication: Mobility status PT Visit Diagnosis: Muscle weakness (generalized) (M62.81);Other abnormalities of gait and mobility (R26.89)    Time: 1300-1315 PT Time Calculation (min) (ACUTE ONLY): 15 min   Charges:   PT Evaluation $PT Eval Low Complexity: 1 Low          Jahzeel Poythress, PT, GCS 06/05/18,2:08 PM

## 2018-06-06 LAB — BASIC METABOLIC PANEL
Anion gap: 11 (ref 5–15)
BUN: 36 mg/dL — ABNORMAL HIGH (ref 8–23)
CO2: 30 mmol/L (ref 22–32)
Calcium: 8.7 mg/dL — ABNORMAL LOW (ref 8.9–10.3)
Chloride: 95 mmol/L — ABNORMAL LOW (ref 98–111)
Creatinine, Ser: 7.57 mg/dL — ABNORMAL HIGH (ref 0.44–1.00)
GFR calc Af Amer: 6 mL/min — ABNORMAL LOW (ref >60)
GFR calc non Af Amer: 5 mL/min — ABNORMAL LOW (ref >60)
Glucose, Bld: 86 mg/dL (ref 70–99)
Potassium: 3.8 mmol/L (ref 3.5–5.1)
Sodium: 136 mmol/L (ref 135–145)

## 2018-06-06 LAB — MAGNESIUM: Magnesium: 1.8 mg/dL (ref 1.7–2.4)

## 2018-06-06 LAB — PHOSPHORUS: Phosphorus: 4 mg/dL (ref 2.5–4.6)

## 2018-06-06 MED ORDER — NEPRO/CARBSTEADY PO LIQD
237.0000 mL | Freq: Two times a day (BID) | ORAL | Status: DC
  Administered 2018-06-06 – 2018-06-07 (×3): 237 mL via ORAL

## 2018-06-06 MED ORDER — RENA-VITE PO TABS
1.0000 | ORAL_TABLET | Freq: Every day | ORAL | Status: DC
  Administered 2018-06-06: 1 via ORAL
  Filled 2018-06-06 (×2): qty 1

## 2018-06-06 NOTE — Progress Notes (Signed)
PT Cancellation Note  Patient Details Name: Delecia Vastine MRN: [id number] DOB: 1945-12-01   Cancelled Treatment:    Reason Eval/Treat Not Completed: Patient at procedure or test/unavailable.  Pt currently at hemodialysis.  Will re-attempt later when pt is available.  Roxanne Gates, PT, DPT  Roxanne Gates 06/06/2018, 11:53 AM

## 2018-06-06 NOTE — Progress Notes (Signed)
Pre HD Assessment    06/06/18 0940  Neurological  Level of Consciousness Alert  Orientation Level Oriented X4  Respiratory  Respiratory Pattern Regular  Chest Assessment Chest expansion symmetrical  Bilateral Breath Sounds Clear;Diminished  Cough None  Cardiac  Pulse Irregular  ECG Monitor Yes  Cardiac Rhythm Atrial fibrillation  Vascular  R Radial Pulse +2  L Radial Pulse +2  Edema Generalized  Psychosocial  Psychosocial (WDL) WDL

## 2018-06-06 NOTE — Care Management Note (Addendum)
Case Management Note  Patient Details  Name: Toni Carlson MRN: [id number] Date of Birth: January 08, 1946  Subjective/Objective:      Patient currently in HD.  RNCM  To bedside to ask about SNF recommendations.  PAtient is alert and oriented.   She is agreeable to short term rehab and would then like to return to [facility].  She states she does not have a preference on home health agencies when she returns to [place].  Per [facility] they have attempted to open her in the past several times as recent as December-January.  Have been unable to see her at [facility].  Received an Higher education careers adviser from [place] with [facility] that [facility] uses [facility] and that they will see her for home health services once she returns to [facility].  Asked patient if she has a preference for home health services; she does not.  Also asked patient if she had a preference for SNF.  She states she has been to SNF before but cannot remember the name.  She would like to return there.  Randall Hiss, CSW has been made aware.    Has a walker and a wheelchair at home.  HD at [facility] MWF.       Action/Plan:   Expected Discharge Date:                  Expected Discharge Plan:  Skilled Nursing Facility(Assisted Living)  In-House Referral:     Discharge planning Services  CM Consult  Post Acute Care Choice:    Choice offered to:     DME Arranged:    DME Agency:     HH Arranged:    HH Agency:     Status of Service:  In process, will continue to follow  If discussed at [place] Length of Stay Meetings, dates discussed:    Additional Comments:  Elza Rafter, RN 06/06/2018, 10:01 AM

## 2018-06-06 NOTE — Clinical Social Work Note (Signed)
CSW was informed that patient would like to go to SNF for short term rehab, CSW was given permission to begin bed search in [place].  CSW faxed required information to SNFs, awaiting for bed offers.  Jones Broom. [place], MSW, [place]  06/06/2018 10:47 AM

## 2018-06-06 NOTE — NC FL2 (Addendum)
[place] LEVEL OF CARE SCREENING TOOL     IDENTIFICATION  Patient Name: Toni Carlson Birthdate: [birth year] Sex: female Admission Date (Current Location): 06/03/2018  [place] and [place] Number:  [name] [phone] [place] and Address:  [facility], [address] [place],  [address]      Provider Number: [id number]  Attending Physician Name and Address:  Otila Back, MD  Relative Name and Phone Number:  Dorthy Cooler Daughter [phone]  [phone]     Current Level of Care: Hospital Recommended Level of Care: ALF Prior Approval Number:    Date Approved/Denied:   PASRR Number: [id number]  Discharge Plan: ALF    Current Diagnoses: Patient Active Problem List   Diagnosis Date Noted  . Hypotension 06/03/2018  . HCAP (healthcare-associated pneumonia) 05/28/2018  . Acute respiratory failure with hypoxia ([place]) 05/16/2018  . Complication of AV dialysis fistula 04/17/2017  . 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 Hyperlipidemia 03/16/2017  . Complication from renal dialysis device 11/27/2016  . Gastric polyp 08/18/2015  . Anemia 07/15/2015  . GI bleed 07/15/2015  . Polyneuropathy 07/08/2015  . Degenerative disc disease, lumbar 07/08/2015  . Pancytopenia ([place]) 07/08/2015  . Generalized weakness 07/08/2015  . End stage renal disease on dialysis ([place]) 07/08/2015  . Atherosclerotic peripheral vascular disease ([place]) 07/08/2015  . Pain in both feet 07/02/2015  . Gastric ulceration   . Acute GI bleeding   . GIB (gastrointestinal bleeding) 04/12/2015  . Left-sided weakness 04/01/2015  . Essential hypertension 04/01/2015  . Anemia of chronic disease 04/01/2015  . Elevated troponin 04/01/2015  . Pulmonary hypertension ([place]) 04/01/2015  . Atrial fibrillation ([place])   . CVA (cerebral infarction) 03/26/2015    Orientation RESPIRATION BLADDER Height & Weight     Self, Time, Situation, Place  O2(2L)  Continent Weight: 164 lb 6.4 oz (74.6 kg) Height:  5\' 7"  (170.2 cm)  BEHAVIORAL SYMPTOMS/MOOD NEUROLOGICAL BOWEL NUTRITION STATUS  (none) (none) Continent Diet(Renal with Fluid Restrictions)  AMBULATORY STATUS COMMUNICATION OF NEEDS Skin   Limited Assist Verbally Normal                       Personal Care Assistance Level of Assistance  Bathing, Dressing, Feeding, Total care Bathing Assistance: Limited assistance Feeding assistance: Limited assistance Dressing Assistance: Limited assistance Total Care Assistance: Limited assistance   Functional Limitations Info  Sight, Hearing, Speech Sight Info: Adequate Hearing Info: Adequate Speech Info: Adequate    SPECIAL CARE FACTORS FREQUENCY  PT (By licensed PT), OT (By licensed OT)     PT Frequency: Minimum 2x a week home health OT Frequency: Minimum 2x a week home health            Contractures Contractures Info: Not present    Additional Factors Info  Code Status, Allergies, Psychotropic Code Status Info: Full Code Allergies Info: NKA Psychotropic Info: escitalopram (LEXAPRO) tablet 10 mg          Current Medications (06/06/2018):  This is the current hospital active medication list Current Facility-Administered Medications  Medication Dose Route Frequency Provider Last Rate Last Dose  . 0.9 %  sodium chloride infusion  250 mL Intravenous PRN Dustin Flock, MD      . acetaminophen (TYLENOL) tablet 650 mg  650 mg Oral Q6H PRN Dustin Flock, MD       Or  . acetaminophen (TYLENOL) suppository 650 mg  650 mg Rectal Q6H PRN Dustin Flock, MD      . alum &  mag hydroxide-simeth (MAALOX/MYLANTA) 200-200-20 MG/5ML suspension 30 mL  30 mL Oral QID PRN Dustin Flock, MD      . atorvastatin (LIPITOR) tablet 40 mg  40 mg Oral QHS Dustin Flock, MD   40 mg at 06/05/18 2108  . bisacodyl (DULCOLAX) EC tablet 5 mg  5 mg Oral Daily PRN Dustin Flock, MD      . calcium acetate (PHOSLO) capsule 1,334 mg  1,334 mg Oral TID  WC Dustin Flock, MD   1,334 mg at 06/06/18 0932  . calcium carbonate (TUMS - dosed in mg elemental calcium) chewable tablet 200 mg of elemental calcium  1 tablet Oral TID WC Dustin Flock, MD   200 mg of elemental calcium at 06/06/18 0933  . Chlorhexidine Gluconate Cloth 2 % PADS 6 each  6 each Topical Q0600 Holley Raring, [place], MD   6 each at 06/05/18 0600  . chlorpheniramine-HYDROcodone (TUSSIONEX) 10-8 MG/5ML suspension 5 mL  5 mL Oral Q12H PRN Dustin Flock, MD      . clopidogrel (PLAVIX) tablet 75 mg  75 mg Oral Daily Dustin Flock, MD   75 mg at 06/06/18 0933  . epoetin alfa (EPOGEN,PROCRIT) injection 10,000 Units  10,000 Units Intravenous Q M,W,F-HD Lavonia Dana, MD   10,000 Units at 06/04/18 1220  . escitalopram (LEXAPRO) tablet 10 mg  10 mg Oral Daily Dustin Flock, MD   10 mg at 06/06/18 0933  . feeding supplement (NEPRO CARB STEADY) liquid 237 mL  237 mL Oral BID BM Ojie, Jude, MD   237 mL at 06/06/18 0936  . gabapentin (NEURONTIN) capsule 300 mg  300 mg Oral QHS Dustin Flock, MD   300 mg at 06/05/18 2109  . guaiFENesin (MUCINEX) 12 hr tablet 600 mg  600 mg Oral BID Dustin Flock, MD   600 mg at 06/06/18 0933  . guaifenesin (ROBITUSSIN) 100 MG/5ML syrup 200 mg  200 mg Oral Q6H PRN Dustin Flock, MD      . HYDROcodone-acetaminophen (NORCO/VICODIN) 5-325 MG per tablet 1-2 tablet  1-2 tablet Oral Q4H PRN Dustin Flock, MD   1 tablet at 06/05/18 [phone]  . loperamide (IMODIUM) capsule 2 mg  2 mg Oral PRN Dustin Flock, MD      . loratadine (CLARITIN) tablet 10 mg  10 mg Oral Daily Dustin Flock, MD   10 mg at 06/06/18 0933  . midodrine (PROAMATINE) tablet 5 mg  5 mg Oral TID WC Dustin Flock, MD   5 mg at 06/06/18 0933  . multivitamin (RENA-VIT) tablet 1 tablet  1 tablet Oral QHS Ojie, Jude, MD      . mupirocin ointment (BACTROBAN) 2 % 1 application  1 application Nasal BID Dustin Flock, MD   1 application at 99/24/26 0932  . ondansetron (ZOFRAN) tablet 4 mg  4 mg  Oral Q6H PRN Dustin Flock, MD       Or  . ondansetron (ZOFRAN) injection 4 mg  4 mg Intravenous Q6H PRN Dustin Flock, MD      . ondansetron (ZOFRAN) tablet 4 mg  4 mg Oral Q6H PRN Dustin Flock, MD      . pantoprazole (PROTONIX) EC tablet 40 mg  40 mg Oral BID Dustin Flock, MD   40 mg at 06/06/18 0933  . polyethylene glycol (MIRALAX / GLYCOLAX) packet 17 g  17 g Oral Daily PRN Dustin Flock, MD      . senna-docusate (Senokot-S) tablet 1 tablet  1 tablet Oral QHS PRN Dustin Flock, MD      .  sodium chloride flush (NS) 0.9 % injection 3 mL  3 mL Intravenous Once Delman Kitten, MD      . sodium chloride flush (NS) 0.9 % injection 3 mL  3 mL Intravenous Q12H Dustin Flock, MD   3 mL at 06/06/18 0935  . sodium chloride flush (NS) 0.9 % injection 3 mL  3 mL Intravenous PRN Dustin Flock, MD      . traZODone (DESYREL) tablet 50 mg  50 mg Oral QHS Dustin Flock, MD   50 mg at 06/05/18 2108  . TRIPLE ANTIBIOTIC [phone] OINT 1 application  1 application Apply externally BID BM & HS PRN Dustin Flock, MD      . trolamine salicylate (ASPERCREME) 10 % cream   Topical BID PRN Dustin Flock, MD         Discharge Medications: STOP taking these medications   azithromycin 250 MG tablet Commonly known as:  ZITHROMAX   spironolactone 25 MG tablet Commonly known as:  ALDACTONE     TAKE these medications   acetaminophen 500 MG tablet Commonly known as:  TYLENOL Take 500 mg by mouth every 4 (four) hours as needed for mild pain, fever or headache.   atorvastatin 40 MG tablet Commonly known as:  LIPITOR Take 1 tablet (40 mg total) by mouth daily. What changed:  when to take this   bisacodyl 5 MG EC tablet Commonly known as:  DULCOLAX Take 5 mg by mouth daily as needed for mild constipation or moderate constipation.   calcium acetate 667 MG capsule Commonly known as:  PHOSLO Take 1,334 mg by mouth 3 (three) times daily.   calcium carbonate 500 MG chewable  tablet Commonly known as:  TUMS - dosed in mg elemental calcium Chew 1 tablet by mouth 3 (three) times daily as needed.   carvedilol 3.125 MG tablet Commonly known as:  COREG Take 1 tablet (3.125 mg total) by mouth 2 (two) times daily with a meal for 30 days.   cetirizine 10 MG tablet Commonly known as:  ZYRTEC Take 10 mg by mouth daily.   chlorpheniramine-HYDROcodone 10-8 MG/5ML Suer Commonly known as:  TUSSIONEX Take 5 mLs by mouth every 12 (twelve) hours as needed for cough.   clopidogrel 75 MG tablet Commonly known as:  PLAVIX Take 75 mg daily by mouth.   escitalopram 10 MG tablet Commonly known as:  LEXAPRO Take 10 mg by mouth daily.   gabapentin 300 MG capsule Commonly known as:  NEURONTIN Take 300 mg by mouth at bedtime.   guaifenesin 100 MG/5ML syrup Commonly known as:  ROBITUSSIN Take 200 mg by mouth every 6 (six) hours as needed for cough.   loperamide 2 MG capsule Commonly known as:  IMODIUM Take 2 mg by mouth as needed for diarrhea or loose stools.   loratadine 10 MG tablet Commonly known as:  CLARITIN Take 10 mg by mouth daily.   magnesium hydroxide 400 MG/5ML suspension Commonly known as:  MILK OF MAGNESIA Take 30 mLs by mouth at bedtime as needed for mild constipation.   midodrine 5 MG tablet Commonly known as:  PROAMATINE Take 1 tablet (5 mg total) by mouth 3 (three) times daily with meals.   [place] 200-200-20 MG/5ML suspension Generic drug:  alum & mag hydroxide-simeth Take 30 mLs by mouth 4 (four) times daily as needed for indigestion or heartburn.   mupirocin ointment 2 % Commonly known as:  BACTROBAN Place 1 application into the nose 2 (two) times daily.   ondansetron 4 MG  tablet Commonly known as:  ZOFRAN Take 4 mg by mouth every 6 (six) hours as needed for nausea or vomiting.   pantoprazole 40 MG tablet Commonly known as:  PROTONIX Take 1 tablet (40 mg total) by mouth 2 (two) times daily.   polyethylene glycol  packet Commonly known as:  MIRALAX / GLYCOLAX Take 17 g by mouth daily as needed for mild constipation.   senna-docusate 8.6-50 MG tablet Commonly known as:  Senokot-S Take 1 tablet by mouth at bedtime as needed for mild constipation.   traZODone 50 MG tablet Commonly known as:  DESYREL Take 50 mg by mouth at bedtime.   TRIPLE ANTIBIOTIC [phone] Oint Apply 1 application topically 3 times/day as needed-between meals & bedtime (for wound care).   trolamine salicylate 10 % cream Commonly known as:  ASPERCREME Apply topically 2 (two) times daily as needed for muscle pain.     Relevant Imaging Results:  Relevant Lab Results:   Additional Information SS# [id number]  Anell Barr

## 2018-06-06 NOTE — Progress Notes (Signed)
[facility]  ROUNDING NOTE   Subjective:   Seen and examined on hemodialysis treatment. UF goal of 1 liter. Tolerating treatment well.     HEMODIALYSIS FLOWSHEET:  Blood Flow Rate (mL/min): 400 mL/min Arterial Pressure (mmHg): -170 mmHg Venous Pressure (mmHg): 160 mmHg Transmembrane Pressure (mmHg): 60 mmHg Ultrafiltration Rate (mL/min): 480 mL/min Dialysate Flow Rate (mL/min): 600 ml/min Conductivity: Machine : 13.7 Conductivity: Machine : 13.7 Dialysis Fluid Bolus: Normal Saline Bolus Amount (mL): 250 mL    Objective:  Vital signs in last 24 hours:  Temp:  [97.5 F (36.4 C)-98.1 F (36.7 C)] 98.1 F (36.7 C) (01/22 0945) Pulse Rate:  [65-111] 80 (01/22 1145) Resp:  [14-20] 17 (01/22 1145) BP: (89-122)/(49-78) 122/78 (01/22 1145) SpO2:  [78 %-100 %] 100 % (01/22 1145) Weight:  [75.2 kg] 75.2 kg (01/22 0945)  Weight change:  Filed Weights   06/04/18 1346 06/05/18 0339 06/06/18 0945  Weight: 75.2 kg 74.6 kg 75.2 kg    Intake/Output: No intake/output data recorded.   Intake/Output this shift:  No intake/output data recorded.  Physical Exam: General: No acute distress  Head: Normocephalic, atraumatic. Moist oral mucosal membranes  Eyes: Anicteric  Neck: Supple, trachea midline  Lungs:  clear  Heart: S1S2 no rubs  Abdomen:  Soft, nontender, bowel sounds present  Extremities: No peripheral edema.  Neurologic: Awake, alert, following commands  Skin: No lesions  Access: LUE AVF    Basic Metabolic Panel: Recent Labs  Lab 05/31/18 0622 06/03/18 0650 06/04/18 0412 06/05/18 0431 06/06/18 0400  NA 138 134* 135 136 136  K 3.6 3.9 3.7 3.6 3.8  CL 95* 94* 95* 96* 95*  CO2 29 28 29 31 30   GLUCOSE 115* 118* 113* 114* 86  BUN 24* 30* 39* 24* 36*  CREATININE 5.27* 7.16* 8.54* 5.83* 7.57*  CALCIUM 8.2* 8.1* 8.3* 8.7* 8.7*  MG 1.8  --   --  1.8 1.8  PHOS 4.2  --  4.5 3.0 4.0    Liver Function Tests: No results for input(s): AST, ALT, ALKPHOS,  BILITOT, PROT, ALBUMIN in the last 168 hours. No results for input(s): LIPASE, AMYLASE in the last 168 hours. No results for input(s): AMMONIA in the last 168 hours.  CBC: Recent Labs  Lab 06/03/18 0650 06/04/18 0412 06/05/18 0431  WBC 10.9* 7.0 8.5  HGB 8.6* 8.4* 9.2*  HCT 28.9* 28.4* 31.5*  MCV 99.3 99.3 102.3*  PLT 156 134* 165    Cardiac Enzymes: Recent Labs  Lab 06/03/18 0650 06/03/18 1122 06/03/18 1640 06/03/18 2245  TROPONINI 0.30* 0.37* 0.33* 0.33*    BNP: Invalid input(s): POCBNP  CBG: No results for input(s): GLUCAP in the last 168 hours.  Microbiology: Results for orders placed or performed during the hospital encounter of 05/28/18  Group A Strep by PCR     Status: None   Collection Time: 05/28/18  1:24 AM  Result Value Ref Range Status   Group A Strep by PCR NOT DETECTED NOT DETECTED Final    Comment: Performed at [facility], [address] [place], [place] [address]  Blood Culture (routine x 2)     Status: None   Collection Time: 05/28/18  3:46 AM  Result Value Ref Range Status   Specimen Description BLOOD RIGHT HAND  Final   Special Requests   Final    BOTTLES DRAWN AEROBIC AND ANAEROBIC Blood Culture adequate volume   Culture   Final    NO GROWTH 5 DAYS Performed at [facility], [address] [place], [place] [address]    Report Status 06/02/2018 FINAL  Final  Blood Culture (routine x 2)     Status: None   Collection Time: 05/28/18  3:47 AM  Result Value Ref Range Status   Specimen Description BLOOD RIGHT ARM  Final   Special Requests   Final    BOTTLES DRAWN AEROBIC AND ANAEROBIC Blood Culture results may not be optimal due to an excessive volume of blood received in culture bottles   Culture   Final    NO GROWTH 5 DAYS Performed at [facility], [address] [place],  [address]    Report Status 06/02/2018 FINAL  Final    Coagulation Studies: No results for input(s): LABPROT, INR in the last  72 hours.  Urinalysis: No results for input(s): COLORURINE, LABSPEC, PHURINE, GLUCOSEU, HGBUR, BILIRUBINUR, KETONESUR, PROTEINUR, UROBILINOGEN, NITRITE, LEUKOCYTESUR in the last 72 hours.  Invalid input(s): APPERANCEUR    Imaging: No results found.   Medications:   . sodium chloride     . atorvastatin  40 mg Oral QHS  . calcium acetate  1,334 mg Oral TID WC  . calcium carbonate  1 tablet Oral TID WC  . Chlorhexidine Gluconate Cloth  6 each Topical Q0600  . clopidogrel  75 mg Oral Daily  . epoetin (EPOGEN/PROCRIT) injection  10,000 Units Intravenous Q M,W,F-HD  . escitalopram  10 mg Oral Daily  . feeding supplement (NEPRO CARB STEADY)  237 mL Oral BID BM  . gabapentin  300 mg Oral QHS  . guaiFENesin  600 mg Oral BID  . loratadine  10 mg Oral Daily  . midodrine  5 mg Oral TID WC  . multivitamin  1 tablet Oral QHS  . mupirocin ointment  1 application Nasal BID  . pantoprazole  40 mg Oral BID  . sodium chloride flush  3 mL Intravenous Once  . sodium chloride flush  3 mL Intravenous Q12H  . traZODone  50 mg Oral QHS   sodium chloride, acetaminophen **OR** acetaminophen, alum & mag hydroxide-simeth, bisacodyl, chlorpheniramine-HYDROcodone, guaifenesin, HYDROcodone-acetaminophen, loperamide, ondansetron **OR** ondansetron (ZOFRAN) IV, ondansetron, polyethylene glycol, senna-docusate, sodium chloride flush, TRIPLE ANTIBIOTIC, trolamine salicylate  Assessment/ Plan:  73 y.o. female with end stage renal disease on hemodialysis, hypertension, coronary artery disease, CVA, pulmonary hypertension, carotid artery stenosis, colon mass, atrial fibrillation, depression, hyperlipidemia  [facility] [address] Left AVF 74.5kg. 133min  1. End stage renal disease: 2K bath.  Tolerating treatment well.   2. Anemia of chronic kidney disease:   - EPO with HD treatment.     3. Secondary Hyperparathyroidism:  -  calcium acetate.  4. Hypertension:  Hypotensive -  Hold carvedilol and  spironolactone.  Started midodrine.    LOS: 3 Menno Vanbergen 1/22/20201:01 PM

## 2018-06-06 NOTE — Progress Notes (Signed)
HD TX started w/o complication, pt is calm, cooperative. MD gave verbal order for 3hr tx time. Pt b/p low, asymptomatic. Expected to improve, monitoring.   06/06/18 0945  Vital Signs  Temp 98.1 F (36.7 C)  Temp Source Oral  Pulse Rate (!) 111  Pulse Rate Source Monitor  Resp 17  BP (!) 89/49  BP Location Right Arm  BP Method Automatic  Patient Position (if appropriate) Lying  Oxygen Therapy  SpO2 (!) 78 %  O2 Device Nasal Cannula  O2 Flow Rate (L/min) 4 L/min  Pain Assessment  Pain Scale 0-10  Pain Score 0  Dialysis Weight  Weight 75.2 kg  Type of Weight Pre-Dialysis  Time-Out for Hemodialysis  What Procedure? HD  Pt Identifiers(min of two) First/Last Name;MRN/Account#  Correct Site? Yes  Correct Side? Yes  Correct Procedure? Yes  Consents Verified? Yes  Rad Studies Available? N/A  Safety Precautions Reviewed? Yes  Engineer, civil (consulting) Number 4  Station Number 2  UF/Alarm Test Passed  Conductivity: Meter 13.6  Conductivity: Machine  13.7  pH 7.4  Reverse Osmosis Main  Normal Saline Lot Number [id number]  Dialyzer Lot Number [id number]  Disposable Set Lot Number [id number]  Machine Temperature 98.6 F (37 C)  Musician and Audible Yes  Blood Lines Intact and Secured Yes  Pre Treatment Patient Checks  Vascular access used during treatment Fistula  Hepatitis B Surface Antigen Results Negative  Date Hepatitis B Surface Antigen Drawn 07/05/17  Hepatitis B Surface Antibody  (>10)  Date Hepatitis B Surface Antibody Drawn 07/06/11  Hemodialysis Consent Verified Yes  Hemodialysis Standing Orders Initiated Yes  ECG (Telemetry) Monitor On Yes  Prime Ordered Normal Saline  Length of  DialysisTreatment -hour(s) 3 Hour(s)  Dialysis Treatment Comments Na 140  Dialyzer Elisio 17H NR  Dialysate 2K, 2.5 Ca  Dialysis Anticoagulant None  Dialysate Flow Ordered 600  Blood Flow Rate Ordered 400 mL/min  Ultrafiltration Goal 1 Liters  Dialysis Blood Pressure Support  Ordered Normal Saline  During Hemodialysis Assessment  Blood Flow Rate (mL/min) 400 mL/min  Arterial Pressure (mmHg) -170 mmHg  Venous Pressure (mmHg) 160 mmHg  Transmembrane Pressure (mmHg) 50 mmHg  Ultrafiltration Rate (mL/min) 480 mL/min  Dialysate Flow Rate (mL/min) 600 ml/min  Conductivity: Machine  15.2  HD Safety Checks Performed Yes  Dialysis Fluid Bolus Normal Saline  Bolus Amount (mL) 250 mL  Intra-Hemodialysis Comments Tx initiated  Fistula / Graft Left Upper arm  No Placement Date or Time found.   Placed prior to admission: Yes  Orientation: Left  Access Location: Upper arm  Site Condition No complications  Fistula / Graft Assessment Present;Thrill;Bruit  Status Accessed;Flushed;Patent  Needle Size 15 g

## 2018-06-06 NOTE — Progress Notes (Signed)
Post HD    06/06/18 1330  Hand-Off documentation  Report given to (Full Name) Georgeanna Lea, RN   Report received from (Full Name) Beatris Ship, RN   Vital Signs  Temp 98.1 F (36.7 C)  Temp Source Oral  Pulse Rate 63  Pulse Rate Source Monitor  Resp (!) 23  BP 95/64  Oxygen Therapy  SpO2 96 %  O2 Device Nasal Cannula  O2 Flow Rate (L/min) 4 L/min  Pulse Oximetry Type Continuous  Dialysis Weight  Weight 74.9 kg  Type of Weight Post-Dialysis  Post-Hemodialysis Assessment  Rinseback Volume (mL) 250 mL  KECN 68.2 V  Dialyzer Clearance Lightly streaked  Duration of HD Treatment -hour(s) 3 hour(s)  Hemodialysis Intake (mL) 500 mL  UF Total -Machine (mL) 1501 mL  Net UF (mL) 1001 mL  Tolerated HD Treatment Yes  AVG/AVF Arterial Site Held (minutes) 10 minutes  AVG/AVF Venous Site Held (minutes) 10 minutes  Fistula / Graft Left Upper arm  No Placement Date or Time found.   Placed prior to admission: Yes  Orientation: Left  Access Location: Upper arm  Site Condition No complications  Fistula / Graft Assessment Present;Thrill;Bruit  Status Deaccessed

## 2018-06-06 NOTE — Progress Notes (Addendum)
[place] [facility] [place] NAME: Mali Eppard    MR#:  [id number]  DATE OF BIRTH:  [birth year]  SUBJECTIVE:  CHIEF COMPLAINT:   Chief Complaint  Patient presents with  . Dizziness  . Fall   No new complaint this morning.  Patient evaluated by physical therapist and skilled nursing facility placement recommended.  Discussed with patient and patient agreeable.  Case manager assisting with placement plans.  Patient scheduled for hemodialysis today.  REVIEW OF SYSTEMS:  Review of Systems  Constitutional: Negative for chills, fever and weight loss.  HENT: Negative for hearing loss and tinnitus.   Eyes: Negative for blurred vision, double vision and photophobia.  Respiratory: Negative for cough, hemoptysis and sputum production.   Cardiovascular: Negative for chest pain and palpitations.  Gastrointestinal: Negative for heartburn, nausea and vomiting.  Genitourinary: Negative for dysuria, frequency and urgency.  Musculoskeletal: Negative for myalgias and neck pain.       Mechanical fall at assisted living facility prior to admission  Skin: Negative for itching and rash.  Neurological: Negative for dizziness and headaches. Seizures:    Psychiatric/Behavioral: Negative for depression and hallucinations.    DRUG ALLERGIES:  No Known Allergies VITALS:  Blood pressure 122/78, pulse 80, temperature 98.1 F (36.7 C), temperature source Oral, resp. rate 17, height 5\' 7"  (1.702 m), weight 75.2 kg, SpO2 100 %. PHYSICAL EXAMINATION:  Physical Exam  Constitutional: She is oriented to person, place, and time and well-developed, well-nourished, and in no distress.  HENT:  Head: Normocephalic.  Right Ear: External ear normal.  Eyes: Pupils are equal, round, and reactive to light. Conjunctivae and EOM are normal.  Neck: Normal range of motion. Neck supple. No tracheal deviation present. No thyromegaly present.  Cardiovascular: Normal rate, regular rhythm and  normal heart sounds.  Pulmonary/Chest: Effort normal and breath sounds normal. No respiratory distress. She has no wheezes.  Abdominal: Soft. Bowel sounds are normal. She exhibits no distension. There is no abdominal tenderness.  Musculoskeletal: Normal range of motion.        General: No tenderness or edema.  Neurological: She is oriented to person, place, and time. No cranial nerve deficit.  Skin: Skin is warm. She is not diaphoretic. No erythema.  Psychiatric: Affect and judgment normal.   LABORATORY PANEL:  Female CBC Recent Labs  Lab 06/05/18 0431  WBC 8.5  HGB 9.2*  HCT 31.5*  PLT 165   ------------------------------------------------------------------------------------------------------------------ Chemistries  Recent Labs  Lab 06/06/18 0400  NA 136  K 3.8  CL 95*  CO2 30  GLUCOSE 86  BUN 36*  CREATININE 7.57*  CALCIUM 8.7*  MG 1.8   RADIOLOGY:  No results found. ASSESSMENT AND PLAN:   Patient is a 73 year old African-American female with end-stage renal disease presenting with generalized weakness and hypotension  1.  Hypotension; resolved Likely related to blood pressure meds.  Coreg and spironolactone previously discontinued Cortisol level was normal at 18.9. Recent blood pressure of 122/78 this morning.  Patient had recent 2D echocardiogram done on May 17, 2018 which was read by cardiologist.  I reviewed the report printed out from the echo department which revealed ejection fraction of 55 to 60%.  There was no regional wall motion abnormalities.Clinically no evidence of infectious process. Patient already started on midodrine.   2.  Abnormal troponin with some EKG changes in the inferior leads we will cycle cardiac enzymes patient currently has no chest pain Likely due to underlying renal disease.  Patient remains completely asymptomatic.  Reviewed recent 2D echocardiogram with normal ejection fraction.  No regional wall motion abnormalities.  3.     Mechanical fall with no loss of consciousness.   Patient did not hit her head.  Right shin pain after fall x-rays negative  Evaluated by physical therapist.  Recommendation is for skilled nursing facility placement on discharge  4.  End-stage renal disease Nephrology following.  Continue inpatient hemodialysis  5.  Chronic diastolic CHF  Clinically patient does not appear to be in exacerbation.  Stable.    6.  Atrial fibrillation patient was on previously Coumadin currently not on Coumadin  Coreg placed on hold today due to blood pressure being borderline.  Heart rate remains controlled  DVT prophylaxis; patient refuses heparin.  Understands the risk including DVT and potentially death. SCDs ordered previously  Disposition; anticipate discharge and transfer to skilled nursing facility tomorrow if bed available  all the records are reviewed and case discussed with Care Management/Social Worker. Management plans discussed with the patient, and she is in agreement.  CODE STATUS: Full Code  TOTAL TIME TAKING CARE OF THIS PATIENT: 34 minutes.   More than 50% of the time was spent in counseling/coordination of care: YES   Welma Mccombs M.D on 06/06/2018 at 12:21 PM  Between 7am to 6pm - Pager - [phone]  After 6pm go to [email or web address] - Proofreader  [facility]  Office  [phone]  CC: Primary care physician; Patient, No Pcp Per  Note: This dictation was prepared with Dragon dictation along with smaller phrase technology. Any transcriptional errors that result from this process are unintentional.

## 2018-06-06 NOTE — Progress Notes (Signed)
Post HD Assessment    06/06/18 1330  Neurological  Level of Consciousness Alert  Orientation Level Oriented X4  Respiratory  Respiratory Pattern Regular  Chest Assessment Chest expansion symmetrical  Bilateral Breath Sounds Clear;Diminished  Cough None  Cardiac  Pulse Irregular  ECG Monitor Yes  Cardiac Rhythm Atrial fibrillation  Vascular  R Radial Pulse +2  L Radial Pulse +2  Edema Generalized  Psychosocial  Psychosocial (WDL) WDL

## 2018-06-06 NOTE — Progress Notes (Signed)
HD Tx completed    06/06/18 1315  Vital Signs  Pulse Rate (!) 51  Resp 19  BP (!) 91/57  Oxygen Therapy  SpO2 (!) 41 %  O2 Device Nasal Cannula  O2 Flow Rate (L/min) 4 L/min  Pulse Oximetry Type Continuous  During Hemodialysis Assessment  HD Safety Checks Performed Yes  KECN 68.2 KECN  Dialysis Fluid Bolus Normal Saline  Bolus Amount (mL) 250 mL  Intra-Hemodialysis Comments Tx completed;Tolerated well

## 2018-06-07 LAB — BASIC METABOLIC PANEL
Anion gap: 10 (ref 5–15)
BUN: 20 mg/dL (ref 8–23)
CALCIUM: 8.7 mg/dL — AB (ref 8.9–10.3)
CO2: 30 mmol/L (ref 22–32)
Chloride: 97 mmol/L — ABNORMAL LOW (ref 98–111)
Creatinine, Ser: 5.11 mg/dL — ABNORMAL HIGH (ref 0.44–1.00)
GFR calc Af Amer: 9 mL/min — ABNORMAL LOW (ref >60)
GFR calc non Af Amer: 8 mL/min — ABNORMAL LOW (ref >60)
Glucose, Bld: 117 mg/dL — ABNORMAL HIGH (ref 70–99)
Potassium: 3.2 mmol/L — ABNORMAL LOW (ref 3.5–5.1)
Sodium: 137 mmol/L (ref 135–145)

## 2018-06-07 LAB — CBC
HCT: 29.8 % — ABNORMAL LOW (ref 36.0–46.0)
Hemoglobin: 8.7 g/dL — ABNORMAL LOW (ref 12.0–15.0)
MCH: 30 pg (ref 26.0–34.0)
MCHC: 29.2 g/dL — ABNORMAL LOW (ref 30.0–36.0)
MCV: 102.8 fL — AB (ref 80.0–100.0)
Platelets: 144 10*3/uL — ABNORMAL LOW (ref 150–400)
RBC: 2.9 MIL/uL — ABNORMAL LOW (ref 3.87–5.11)
RDW: 19.2 % — ABNORMAL HIGH (ref 11.5–15.5)
WBC: 7.2 10*3/uL (ref 4.0–10.5)
nRBC: 2.2 % — ABNORMAL HIGH (ref 0.0–0.2)

## 2018-06-07 MED ORDER — MIDODRINE HCL 5 MG PO TABS: 5.0000 mg | ORAL_TABLET | Freq: Three times a day (TID) | ORAL | 0 refills | Status: AC

## 2018-06-07 MED ORDER — POTASSIUM CHLORIDE CRYS ER 20 MEQ PO TBCR
20.0000 meq | EXTENDED_RELEASE_TABLET | Freq: Once | ORAL | Status: AC
  Administered 2018-06-07: 20 meq via ORAL
  Filled 2018-06-07: qty 1

## 2018-06-07 NOTE — Clinical Social Work Note (Addendum)
CSW spoke to [facility], patient can return today.  PT worked with her and recommended home health PT, OT, and nursing.  Case manager made aware, CSW notified ALF, they are requesting EMS transport back to facility.  Patient to be d/c'ed today to [facility].  Patient and family agreeable to plans will transport via ems RN to call report to [phone].  CSW left message on daughter Tiffany's voice mail [phone] to inform her that patient is discharging back to ALF with home health.   Jones Broom. McIntosh, MSW, [place]  06/07/2018 3:20 PM

## 2018-06-07 NOTE — Care Management Note (Signed)
Case Management Note  Patient Details  Name: Jhaniya Briski MRN: [id number] Date of Birth: 02-Apr-1946  Subjective/Objective:       PT recommendation has changed to home with home health.  Patient will return to [place].     Action/Plan: Does not have a preference of home health agencies.  [facility] typically uses [facility].  Notified Helene Kelp with [facility] that patient is discharging home today and she has accepted.                Expected Discharge Date:  06/07/18               Expected Discharge Plan:  [facility]isted Living)  In-House Referral:     Discharge planning Services  CM Consult  Post Acute Care Choice:  Home Health Choice offered to:  Patient  DME Arranged:    DME Agency:     HH Arranged:  PT, Nurse's Aide Ina Agency:  [facility]  Status of Service:  In process, will continue to follow  If discussed at [place] Length of Stay Meetings, dates discussed:    Additional Comments:  Elza Rafter, RN 06/07/2018, 11:56 AM

## 2018-06-07 NOTE — Discharge Summary (Signed)
[facility] NAME: Toni Carlson    MR#:  [id number]  DATE OF BIRTH:  1945/08/20  DATE OF ADMISSION:  06/03/2018   ADMITTING PHYSICIAN: Dustin Flock, MD  DATE OF DISCHARGE: 06/07/2018  PRIMARY CARE PHYSICIAN: Patient, No Pcp Per   ADMISSION DIAGNOSIS:  EKG abnormalities [R94.31] ESRD (end stage renal disease) ([place]) [N18.6] Demand ischemia ([place]) [I24.8] Elevated troponin [R79.89] Contusion of right knee, initial encounter [S80.01XA] Hypervolemia, unspecified hypervolemia type [E87.70] DISCHARGE DIAGNOSIS:  Active Problems:   Hypotension  SECONDARY DIAGNOSIS:   Past Medical History:  Diagnosis Date  . A-fib (HCC)    a. on warfarin  . Anemia   . Carotid artery stenosis    a. ultrasound 03/2015: RICA 93-81% stenosis, LICA less than 01% stenosis  . Chronic diastolic CHF (congestive heart failure) ([place])    a. echo 03/2015: EF 60-65%, normal wall motion, diastolic parameters were c/w restrictive physiology, indicative of decreased LV diastolic compliance and/or increased LA pressure, mild AS, mild MR, left atrium was severely dilated, RA was severely dilated, PASP was severely increased at 90 mm Hg  . Coronary artery disease, non-occlusive    a. cardiac cath 2013  . ESRD (end stage renal disease) ([place])    On Tue-Thur-Sat dialysis  . H/O ischemic right MCA stroke    a. 03/2015; b. residual left-sided facial droop and slurred speech  . Hyperparathyroidism, secondary renal ([place])   . Hypertension   . Nicotine dependence   . Pulmonary HTN ([place])    as above  . Symptomatic bradycardia    a. history of symptomatic bradycardia; b. felt to be 2/2 metabolic abnormalities & required temp wire but no PPM, resolved with HD   HOSPITAL COURSE:  Chief complaint; dizziness and falls   HISTORY OF PRESENT ILLNESS: Toni Carlson  is a 73 y.o. female with a known history of atrial fibrillation, anemia, carotid artery disease, chronic diastolic  CHF, end-stage renal disease, hypertension who was recently hospitalized with pneumonia.  Patient's presented with 2 episodes of falling prior to presentation.  She describes it as feeling dizzy and then falling.  Patient's blood pressure has been running low.  She states that even in dialysis her blood pressure was lower than normal.  Here she is noted to have blood pressure in the 90s.  She denies any chest pain or palpitations.  She does have cough from recent pneumonia.  Denies any fevers or chills.  Complains of pain in the right shin after a fall.  Imaging studies negative.  Patient was admitted to medical service for further evaluation.  Please refer to the H&P dictated for further details.   HOSPITAL COURSE; 1.Hypotension; resolved Likely related to blood pressure meds.  Coreg and spironolactone previously discontinued Cortisol level was normal at 18.9.  Blood pressure now trending up since started on midodrine.  Recent blood pressure this morning was 130/121.  Resumed Coreg at 3.125 mg p.o. twice daily with hold parameters for systolic blood pressure less than 130. Patient had recent 2D echocardiogram done on May 17, 2018 which was read by cardiologist.  I reviewed the report printed out from the echo department which revealed ejection fraction of 55 to 60%.  There was no regional wall motion abnormalities.Clinically no evidence of infectious process.  Patient remains asymptomatic and clinically stable  2.Abnormal troponin with some EKG changes in the inferior leads we will cycle cardiac enzymes patient currently has no chest pain Likely due to underlying  renal disease.  Patient remains completely asymptomatic.  Reviewed recent 2D echocardiogram with normal ejection fraction.  No regional wall motion abnormalities.  3.  Mechanical fall with no loss of consciousness.   Patient did not hit her head.  Right shin pain after fall x-rays negative  Evaluated by physical therapist.   Recommendation is for skilled nursing facility placement on discharge  4.End-stage renal disease Nephrology following.  Continue inpatient hemodialysis  5.Chronic diastolic CHF  Clinically patient does not appear to be in exacerbation.  Stable.    6.Atrial fibrillation patient was on previously Coumadin currently not on Coumadin  Resumed Coreg with hold parameters.  Discussed anticoagulation with patient during this admission.  Patient refused anticoagulation.  Even refused heparin for DVT prophylaxis.  Patient concerned that it causes her blood level to be low and ultimately causes her blood pressure to be low as well.  Disposition; patient reevaluated by physical therapist today.  Did very well.  Recommendation was for setting up home health with physical therapy on discharge.  DISCHARGE CONDITIONS:  Stable CONSULTS OBTAINED:   DRUG ALLERGIES:  No Known Allergies DISCHARGE MEDICATIONS:   Allergies as of 06/07/2018   No Known Allergies     Medication List    STOP taking these medications   azithromycin 250 MG tablet Commonly known as:  ZITHROMAX   spironolactone 25 MG tablet Commonly known as:  ALDACTONE     TAKE these medications   acetaminophen 500 MG tablet Commonly known as:  TYLENOL Take 500 mg by mouth every 4 (four) hours as needed for mild pain, fever or headache.   atorvastatin 40 MG tablet Commonly known as:  LIPITOR Take 1 tablet (40 mg total) by mouth daily. What changed:  when to take this   bisacodyl 5 MG EC tablet Commonly known as:  DULCOLAX Take 5 mg by mouth daily as needed for mild constipation or moderate constipation.   calcium acetate 667 MG capsule Commonly known as:  PHOSLO Take 1,334 mg by mouth 3 (three) times daily.   calcium carbonate 500 MG chewable tablet Commonly known as:  TUMS - dosed in mg elemental calcium Chew 1 tablet by mouth 3 (three) times daily as needed.   carvedilol 3.125 MG tablet Commonly known as:   COREG Take 1 tablet (3.125 mg total) by mouth 2 (two) times daily with a meal for 30 days.   cetirizine 10 MG tablet Commonly known as:  ZYRTEC Take 10 mg by mouth daily.   chlorpheniramine-HYDROcodone 10-8 MG/5ML Suer Commonly known as:  TUSSIONEX Take 5 mLs by mouth every 12 (twelve) hours as needed for cough.   clopidogrel 75 MG tablet Commonly known as:  PLAVIX Take 75 mg daily by mouth.   escitalopram 10 MG tablet Commonly known as:  LEXAPRO Take 10 mg by mouth daily.   gabapentin 300 MG capsule Commonly known as:  NEURONTIN Take 300 mg by mouth at bedtime.   guaifenesin 100 MG/5ML syrup Commonly known as:  ROBITUSSIN Take 200 mg by mouth every 6 (six) hours as needed for cough.   loperamide 2 MG capsule Commonly known as:  IMODIUM Take 2 mg by mouth as needed for diarrhea or loose stools.   loratadine 10 MG tablet Commonly known as:  CLARITIN Take 10 mg by mouth daily.   magnesium hydroxide 400 MG/5ML suspension Commonly known as:  MILK OF MAGNESIA Take 30 mLs by mouth at bedtime as needed for mild constipation.   midodrine 5 MG tablet Commonly known  as:  PROAMATINE Take 1 tablet (5 mg total) by mouth 3 (three) times daily with meals.   [place] 200-200-20 MG/5ML suspension Generic drug:  alum & mag hydroxide-simeth Take 30 mLs by mouth 4 (four) times daily as needed for indigestion or heartburn.   mupirocin ointment 2 % Commonly known as:  BACTROBAN Place 1 application into the nose 2 (two) times daily.   ondansetron 4 MG tablet Commonly known as:  ZOFRAN Take 4 mg by mouth every 6 (six) hours as needed for nausea or vomiting.   pantoprazole 40 MG tablet Commonly known as:  PROTONIX Take 1 tablet (40 mg total) by mouth 2 (two) times daily.   polyethylene glycol packet Commonly known as:  MIRALAX / GLYCOLAX Take 17 g by mouth daily as needed for mild constipation.   senna-docusate 8.6-50 MG tablet Commonly known as:  Senokot-S Take 1 tablet by  mouth at bedtime as needed for mild constipation.   traZODone 50 MG tablet Commonly known as:  DESYREL Take 50 mg by mouth at bedtime.   TRIPLE ANTIBIOTIC [phone] Oint Apply 1 application topically 3 times/day as needed-between meals & bedtime (for wound care).   trolamine salicylate 10 % cream Commonly known as:  ASPERCREME Apply topically 2 (two) times daily as needed for muscle pain.        DISCHARGE INSTRUCTIONS:   DIET:  Renal diet DISCHARGE CONDITION:  Stable ACTIVITY:  Activity as tolerated OXYGEN:  Home Oxygen: Yes.    Oxygen Delivery: 2 liters/min via Patient connected to nasal cannula oxygen DISCHARGE LOCATION:  Assisted living facility  If you experience worsening of your admission symptoms, develop shortness of breath, life threatening emergency, suicidal or homicidal thoughts you must seek medical attention immediately by calling 911 or calling your MD immediately  if symptoms less severe.  You Must read complete instructions/literature along with all the possible adverse reactions/side effects for all the Medicines you take and that have been prescribed to you. Take any new Medicines after you have completely understood and accpet all the possible adverse reactions/side effects.   Please note  You were cared for by a hospitalist during your hospital stay. If you have any questions about your discharge medications or the care you received while you were in the hospital after you are discharged, you can call the unit and asked to speak with the hospitalist on call if the hospitalist that took care of you is not available. Once you are discharged, your primary care physician will handle any further medical issues. Please note that NO REFILLS for any discharge medications will be authorized once you are discharged, as it is imperative that you return to your primary care physician (or establish a relationship with a primary care physician if you do not have one)  for your aftercare needs so that they can reassess your need for medications and monitor your lab values.    On the day of Discharge:  VITAL SIGNS:  Blood pressure (!) 130/121, pulse 89, temperature 98.2 F (36.8 C), temperature source Oral, resp. rate 16, height 5\' 7"  (1.702 m), weight 73.8 kg, SpO2 94 %. PHYSICAL EXAMINATION:  GENERAL:  73 y.o.-year-old patient lying in the bed with no acute distress.  EYES: Pupils equal, round, reactive to light and accommodation. No scleral icterus. Extraocular muscles intact.  HEENT: Head atraumatic, normocephalic. Oropharynx and nasopharynx clear.  NECK:  Supple, no jugular venous distention. No thyroid enlargement, no tenderness.  LUNGS: Normal breath sounds bilaterally, no wheezing, rales,rhonchi or  crepitation. No use of accessory muscles of respiration.  CARDIOVASCULAR: S1, S2 normal. No murmurs, rubs, or gallops.  ABDOMEN: Soft, non-tender, non-distended. Bowel sounds present. No organomegaly or mass.  EXTREMITIES: No pedal edema, cyanosis, or clubbing.  NEUROLOGIC: Cranial nerves II through XII are intact. Muscle strength 5/5 in all extremities. Sensation intact. Gait not checked.  PSYCHIATRIC: The patient is alert and oriented x 3.  SKIN: No obvious rash, lesion, or ulcer.  DATA REVIEW:   CBC Recent Labs  Lab 06/07/18 0337  WBC 7.2  HGB 8.7*  HCT 29.8*  PLT 144*    Chemistries  Recent Labs  Lab 06/06/18 0400 06/07/18 0337  NA 136 137  K 3.8 3.2*  CL 95* 97*  CO2 30 30  GLUCOSE 86 117*  BUN 36* 20  CREATININE 7.57* 5.11*  CALCIUM 8.7* 8.7*  MG 1.8  --      Microbiology Results  Results for orders placed or performed during the hospital encounter of 05/28/18  Group A Strep by PCR     Status: None   Collection Time: 05/28/18  1:24 AM  Result Value Ref Range Status   Group A Strep by PCR NOT DETECTED NOT DETECTED Final    Comment: Performed at [facility], [address] [place], [place] [address]  Blood  Culture (routine x 2)     Status: None   Collection Time: 05/28/18  3:46 AM  Result Value Ref Range Status   Specimen Description BLOOD RIGHT HAND  Final   Special Requests   Final    BOTTLES DRAWN AEROBIC AND ANAEROBIC Blood Culture adequate volume   Culture   Final    NO GROWTH 5 DAYS Performed at [facility], [address] [place], [place] [address]    Report Status 06/02/2018 FINAL  Final  Blood Culture (routine x 2)     Status: None   Collection Time: 05/28/18  3:47 AM  Result Value Ref Range Status   Specimen Description BLOOD RIGHT ARM  Final   Special Requests   Final    BOTTLES DRAWN AEROBIC AND ANAEROBIC Blood Culture results may not be optimal due to an excessive volume of blood received in culture bottles   Culture   Final    NO GROWTH 5 DAYS Performed at [facility], [address], [place],  [address]    Report Status 06/02/2018 FINAL  Final    RADIOLOGY:  No results found.   Management plans discussed with the patient, family and they are in agreement.  CODE STATUS: Full Code   TOTAL TIME TAKING CARE OF THIS PATIENT: 37 minutes.    Toni Carlson M.D on 06/07/2018 at 11:32 AM  Between 7am to 6pm - Pager - [phone]  After 6pm go to [email or web address] - Proofreader  [facility]  Office  [phone]  CC: Primary care physician; Patient, No Pcp Per   Note: This dictation was prepared with Dragon dictation along with smaller phrase technology. Any transcriptional errors that result from this process are unintentional.

## 2018-06-07 NOTE — Plan of Care (Signed)
  Problem: Education: Goal: Knowledge of General Education information will improve Description Including pain rating scale, medication(s)/side effects and non-pharmacologic comfort measures Outcome: Progressing   Problem: Health Behavior/Discharge Planning: Goal: Ability to manage health-related needs will improve Outcome: Progressing   Problem: Clinical Measurements: Goal: Ability to maintain clinical measurements within normal limits will improve Outcome: Progressing Goal: Will remain free from infection Outcome: Progressing Note:  Remains afebrile    Problem: Pain Managment: Goal: General experience of comfort will improve Outcome: Progressing

## 2018-06-07 NOTE — Progress Notes (Signed)
EMS here to pickup patient.

## 2018-06-07 NOTE — Progress Notes (Signed)
Patient is to be discharge in [facility] assisted living, called report and talked to [place], Therapist, sports, also called EMS. Will discontinue peripheral IV and telemetry monitor when EMS is here. RN will continue to monitor.

## 2018-06-07 NOTE — Progress Notes (Signed)
Physical Therapy Treatment Patient Details Name: Toni Carlson MRN: [id number] DOB: 11-01-1945 Today's Date: 06/07/2018    History of Present Illness Patient is a 73 year old female admitted with hypotension. BP this morning 84/48. PMH to include ESRD, on hemodialysis,     PT Comments    Pt sitting up in chair and alert upon PT arrival.  She reported pain in her R LL which she described as a deep bone pain.  She was able to perform STS from bed and chair without assistance and with good use of RW.  Pt agreeable to education regarding controlling descent to chair.  She was able to ambulate 10 ft with RW and responded well to education for use of UE to offload R LE during stance phase.  She also demonstrated proficiency with seated there ex, reporting mild pain increase 1-2 times.  Pt closer to baseline and will benefit from update to [facility] PT when returning to her ALF.  Pt will continue to benefit from skilled PT to focus on strength, pain management and safe functional mobility.  Follow Up Recommendations  Home health PT;Supervision for mobility/OOB     Equipment Recommendations  None recommended by PT    Recommendations for Other Services       Precautions / Restrictions Precautions Precautions: Fall Restrictions Weight Bearing Restrictions: No    Mobility  Bed Mobility Overal bed mobility: (In chair upon PT arrival)                Transfers Overall transfer level: Needs assistance Equipment used: Rolling walker (2 wheeled) Transfers: Sit to/from Stand Sit to Stand: Supervision         General transfer comment: Able to rise from recliner and bedside without assistance; good use of RW.  Educated pt concerning controlled descent when sitting in chair.  Ambulation/Gait Ambulation/Gait assistance: Min guard Gait Distance (Feet): 10 Feet Assistive device: Rolling walker (2 wheeled)     Gait velocity interpretation: <1.8 ft/sec, indicate of risk for recurrent  falls General Gait Details: able to ambulate using UE's to offload R LE when feeling pain.  Pt able to manage RW during turns and when sitting in chair.   Stairs             Wheelchair Mobility    Modified Rankin (Stroke Patients Only)       Balance Overall balance assessment: Needs assistance Sitting-balance support: Feet supported Sitting balance-Leahy Scale: Fair     Standing balance support: Bilateral upper extremity supported Standing balance-Leahy Scale: Good Standing balance comment: Pt able to stand without hands on RW for short periods of time.                            Cognition Arousal/Alertness: Awake/alert Behavior During Therapy: WFL for tasks assessed/performed Overall Cognitive Status: No family/caregiver present to determine baseline cognitive functioning                                 General Comments: Sitting up in chair talking on the phone when PT arrived      Exercises Other Exercises Other Exercises: Reviewed ankle pumps, pillow squeeze, quad sets, hip abduction, SAQ and LAQ x5 which pt was able to demonstrate proficiency and stated that she had "done all of these before".     General Comments        Pertinent Vitals/Pain  Home Living                      Prior Function            PT Goals (current goals can now be found in the care plan section) Acute Rehab PT Goals Patient Stated Goal: to return home PT Goal Formulation: With patient Time For Goal Achievement: 06/19/18 Potential to Achieve Goals: Fair Progress towards PT goals: Progressing toward goals    Frequency    Min 2X/week      PT Plan      Co-evaluation              AM-PAC PT "6 Clicks" Mobility   Outcome Measure  Help needed turning from your back to your side while in a flat bed without using bedrails?: A Little Help needed moving from lying on your back to sitting on the side of a flat bed without using  bedrails?: A Little Help needed moving to and from a bed to a chair (including a wheelchair)?: A Little Help needed standing up from a chair using your arms (e.g., wheelchair or bedside chair)?: None Help needed to walk in hospital room?: A Little Help needed climbing 3-5 steps with a railing? : A Lot 6 Click Score: 18    End of Session Equipment Utilized During Treatment: Gait belt Activity Tolerance: Patient limited by fatigue;Patient limited by pain Patient left: in chair;with chair alarm set;with call bell/phone within reach Nurse Communication: Mobility status PT Visit Diagnosis: Muscle weakness (generalized) (M62.81);Other abnormalities of gait and mobility (R26.89)     Time: 0350-0938 PT Time Calculation (min) (ACUTE ONLY): 26 min  Charges:  $Gait Training: 8-22 mins $Therapeutic Exercise: 8-22 mins                     Roxanne Gates, PT, DPT    Roxanne Gates 06/07/2018, 10:38 AM

## 2018-06-07 NOTE — Progress Notes (Signed)
[facility]  ROUNDING NOTE   Subjective:   Working with PT this morning.   Hemodialysis treatment yesterday. Tolerated treatment well. UF of 1 liter  Objective:  Vital signs in last 24 hours:  Temp:  [98.1 F (36.7 C)-98.3 F (36.8 C)] 98.2 F (36.8 C) (01/23 0803) Pulse Rate:  [43-92] 89 (01/23 0803) Resp:  [14-23] 16 (01/23 0803) BP: (90-130)/(28-121) 130/121 (01/23 0803) SpO2:  [41 %-100 %] 94 % (01/23 0803) Weight:  [73.8 kg-74.9 kg] 73.8 kg (01/23 0400)  Weight change:  Filed Weights   06/06/18 0945 06/06/18 1330 06/07/18 0400  Weight: 75.2 kg 74.9 kg 73.8 kg    Intake/Output: I/O last 3 completed shifts: In: -  Out: 1001 [Other:1001]   Intake/Output this shift:  Total I/O In: 3 [I.V.:3] Out: -   Physical Exam: General: No acute distress  Head: Normocephalic, atraumatic. Moist oral mucosal membranes  Eyes: Anicteric  Neck: Supple, trachea midline  Lungs:  clear  Heart: S1S2 no rubs  Abdomen:  Soft, nontender, bowel sounds present  Extremities: No peripheral edema.  Neurologic: Awake, alert, following commands  Skin: No lesions  Access: LUE AVF    Basic Metabolic Panel: Recent Labs  Lab 06/03/18 0650 06/04/18 0412 06/05/18 0431 06/06/18 0400 06/07/18 0337  NA 134* 135 136 136 137  K 3.9 3.7 3.6 3.8 3.2*  CL 94* 95* 96* 95* 97*  CO2 28 29 31 30 30   GLUCOSE 118* 113* 114* 86 117*  BUN 30* 39* 24* 36* 20  CREATININE 7.16* 8.54* 5.83* 7.57* 5.11*  CALCIUM 8.1* 8.3* 8.7* 8.7* 8.7*  MG  --   --  1.8 1.8  --   PHOS  --  4.5 3.0 4.0  --     Liver Function Tests: No results for input(s): AST, ALT, ALKPHOS, BILITOT, PROT, ALBUMIN in the last 168 hours. No results for input(s): LIPASE, AMYLASE in the last 168 hours. No results for input(s): AMMONIA in the last 168 hours.  CBC: Recent Labs  Lab 06/03/18 0650 06/04/18 0412 06/05/18 0431 06/07/18 0337  WBC 10.9* 7.0 8.5 7.2  HGB 8.6* 8.4* 9.2* 8.7*  HCT 28.9* 28.4* 31.5* 29.8*   MCV 99.3 99.3 102.3* 102.8*  PLT 156 134* 165 144*    Cardiac Enzymes: Recent Labs  Lab 06/03/18 0650 06/03/18 1122 06/03/18 1640 06/03/18 2245  TROPONINI 0.30* 0.37* 0.33* 0.33*    BNP: Invalid input(s): POCBNP  CBG: No results for input(s): GLUCAP in the last 168 hours.  Microbiology: Results for orders placed or performed during the hospital encounter of 05/28/18  Group A Strep by PCR     Status: None   Collection Time: 05/28/18  1:24 AM  Result Value Ref Range Status   Group A Strep by PCR NOT DETECTED NOT DETECTED Final    Comment: Performed at [facility], [address] [place], [place] [address]  Blood Culture (routine x 2)     Status: None   Collection Time: 05/28/18  3:46 AM  Result Value Ref Range Status   Specimen Description BLOOD RIGHT HAND  Final   Special Requests   Final    BOTTLES DRAWN AEROBIC AND ANAEROBIC Blood Culture adequate volume   Culture   Final    NO GROWTH 5 DAYS Performed at [facility], [address], [place], [place] [address]    Report Status 06/02/2018 FINAL  Final  Blood Culture (routine x 2)     Status: None   Collection Time:  05/28/18  3:47 AM  Result Value Ref Range Status   Specimen Description BLOOD RIGHT ARM  Final   Special Requests   Final    BOTTLES DRAWN AEROBIC AND ANAEROBIC Blood Culture results may not be optimal due to an excessive volume of blood received in culture bottles   Culture   Final    NO GROWTH 5 DAYS Performed at [facility], [address] [place],  [address]    Report Status 06/02/2018 FINAL  Final    Coagulation Studies: No results for input(s): LABPROT, INR in the last 72 hours.  Urinalysis: No results for input(s): COLORURINE, LABSPEC, PHURINE, GLUCOSEU, HGBUR, BILIRUBINUR, KETONESUR, PROTEINUR, UROBILINOGEN, NITRITE, LEUKOCYTESUR in the last 72 hours.  Invalid input(s): APPERANCEUR    Imaging: No results found.   Medications:   . sodium  chloride     . atorvastatin  40 mg Oral QHS  . calcium acetate  1,334 mg Oral TID WC  . calcium carbonate  1 tablet Oral TID WC  . Chlorhexidine Gluconate Cloth  6 each Topical Q0600  . clopidogrel  75 mg Oral Daily  . epoetin (EPOGEN/PROCRIT) injection  10,000 Units Intravenous Q M,W,F-HD  . escitalopram  10 mg Oral Daily  . feeding supplement (NEPRO CARB STEADY)  237 mL Oral BID BM  . gabapentin  300 mg Oral QHS  . guaiFENesin  600 mg Oral BID  . loratadine  10 mg Oral Daily  . midodrine  5 mg Oral TID WC  . multivitamin  1 tablet Oral QHS  . mupirocin ointment  1 application Nasal BID  . pantoprazole  40 mg Oral BID  . sodium chloride flush  3 mL Intravenous Once  . sodium chloride flush  3 mL Intravenous Q12H  . traZODone  50 mg Oral QHS   sodium chloride, acetaminophen **OR** acetaminophen, alum & mag hydroxide-simeth, bisacodyl, chlorpheniramine-HYDROcodone, guaifenesin, HYDROcodone-acetaminophen, loperamide, ondansetron **OR** ondansetron (ZOFRAN) IV, ondansetron, polyethylene glycol, senna-docusate, sodium chloride flush, TRIPLE ANTIBIOTIC, trolamine salicylate  Assessment/ Plan:  73 y.o. female with end stage renal disease on hemodialysis, hypertension, coronary artery disease, CVA, pulmonary hypertension, carotid artery stenosis, colon mass, atrial fibrillation, depression, hyperlipidemia  [facility] [address] Left AVF 74.5kg. 144min  1. End stage renal disease: tolerated treatment well yesterday. Next treatment for tomorrow.   2. Anemia of chronic kidney disease:   - EPO with HD treatment.     3. Secondary Hyperparathyroidism:  -  calcium acetate.  4. Hypertension:  Hypotensive -  Holding carvedilol and spironolactone.  Started midodrine on this admission.    LOS: 4 Toni Carlson 1/23/202011:01 AM

## 2018-06-08 ENCOUNTER — Other Ambulatory Visit: Payer: Self-pay

## 2018-06-08 ENCOUNTER — Emergency Department
Admission: EM | Admit: 2018-06-08 | Discharge: 2018-06-08 | Disposition: A | Discharge status: Skilled Nursing Facility | Payer: Medicare Other | Attending: Emergency Medicine | Admitting: Emergency Medicine

## 2018-06-08 DIAGNOSIS — R110 Nausea: Secondary | ICD-10-CM | POA: Diagnosis not present

## 2018-06-08 DIAGNOSIS — R531 Weakness: Secondary | ICD-10-CM | POA: Insufficient documentation

## 2018-06-08 DIAGNOSIS — N186 End stage renal disease: Secondary | ICD-10-CM | POA: Insufficient documentation

## 2018-06-08 DIAGNOSIS — I5032 Chronic diastolic (congestive) heart failure: Secondary | ICD-10-CM | POA: Diagnosis not present

## 2018-06-08 DIAGNOSIS — I132 Hypertensive heart and chronic kidney disease with heart failure and with stage 5 chronic kidney disease, or end stage renal disease: Secondary | ICD-10-CM | POA: Diagnosis not present

## 2018-06-08 DIAGNOSIS — F17210 Nicotine dependence, cigarettes, uncomplicated: Secondary | ICD-10-CM | POA: Diagnosis not present

## 2018-06-08 DIAGNOSIS — Z992 Dependence on renal dialysis: Secondary | ICD-10-CM | POA: Insufficient documentation

## 2018-06-08 DIAGNOSIS — Z7901 Long term (current) use of anticoagulants: Secondary | ICD-10-CM | POA: Insufficient documentation

## 2018-06-08 DIAGNOSIS — Z79899 Other long term (current) drug therapy: Secondary | ICD-10-CM | POA: Insufficient documentation

## 2018-06-08 LAB — CBC
HEMATOCRIT: 34.6 % — AB (ref 36.0–46.0)
Hemoglobin: 10 g/dL — ABNORMAL LOW (ref 12.0–15.0)
MCH: 30.2 pg (ref 26.0–34.0)
MCHC: 28.9 g/dL — ABNORMAL LOW (ref 30.0–36.0)
MCV: 104.5 fL — ABNORMAL HIGH (ref 80.0–100.0)
Platelets: 178 10*3/uL (ref 150–400)
RBC: 3.31 MIL/uL — ABNORMAL LOW (ref 3.87–5.11)
RDW: 20 % — AB (ref 11.5–15.5)
WBC: 8.3 10*3/uL (ref 4.0–10.5)
nRBC: 1.1 % — ABNORMAL HIGH (ref 0.0–0.2)

## 2018-06-08 LAB — BASIC METABOLIC PANEL
ANION GAP: 12 (ref 5–15)
BUN: 35 mg/dL — ABNORMAL HIGH (ref 8–23)
CO2: 28 mmol/L (ref 22–32)
Calcium: 9.4 mg/dL (ref 8.9–10.3)
Chloride: 95 mmol/L — ABNORMAL LOW (ref 98–111)
Creatinine, Ser: 7.61 mg/dL — ABNORMAL HIGH (ref 0.44–1.00)
GFR calc Af Amer: 6 mL/min — ABNORMAL LOW (ref >60)
GFR calc non Af Amer: 5 mL/min — ABNORMAL LOW (ref >60)
Glucose, Bld: 124 mg/dL — ABNORMAL HIGH (ref 70–99)
Potassium: 4 mmol/L (ref 3.5–5.1)
Sodium: 135 mmol/L (ref 135–145)

## 2018-06-08 MED ORDER — ONDANSETRON HCL 4 MG/2ML IJ SOLN
4.0000 mg | Freq: Once | INTRAMUSCULAR | Status: AC
  Administered 2018-06-08: 4 mg via INTRAVENOUS
  Filled 2018-06-08: qty 2

## 2018-06-08 MED ORDER — SODIUM CHLORIDE 0.9 % IV BOLUS
1000.0000 mL | Freq: Once | INTRAVENOUS | Status: AC
  Administered 2018-06-08: 1000 mL via INTRAVENOUS

## 2018-06-08 NOTE — ED Notes (Signed)
Pt repositioned in bed. IV site adjusted to allow NS to run better.

## 2018-06-08 NOTE — ED Notes (Signed)
Pt placed on 2L O2 [place] for 88% RA. 95% on 2L.

## 2018-06-08 NOTE — ED Notes (Signed)
Pt given cup of ice okay per EDP Paduchowski.

## 2018-06-08 NOTE — ED Notes (Signed)
[place] notified pt will return via EMS once available.

## 2018-06-08 NOTE — Discharge Instructions (Addendum)
Please follow-up with your doctor soon as possible, return to the emergency department for any worsening nausea, weakness or shortness of breath/chest pain.  Please proceed to dialysis as scheduled on Monday.

## 2018-06-08 NOTE — ED Provider Notes (Signed)
[facility] Emergency Department Provider Note  Time seen: 2:31 PM  I have reviewed the triage vital signs and the nursing notes.   HISTORY  Chief Complaint Weakness    HPI Toni Carlson is a 73 y.o. female with a past medical history of atrial fibrillation on warfarin, CHF, ESRD on HD, presents to the emergency department for nausea and weakness.  According to the patient since yesterday she has felt very weak and nauseated, went to dialysis this morning but was feeling too weak so they sent her to the emergency department for evaluation.  Denies any vomiting.  Denies any abdominal pain or chest pain.  Patient makes a very small amount of urine each day, states this morning she noted a very small amount of blood in her stool as well.  Patient was concerned so she came to the emergency department for evaluation.  Denies any shortness of breath.  Largely negative review of systems otherwise.   Past Medical History:  Diagnosis Date  . A-fib (HCC)    a. on warfarin  . Anemia   . Carotid artery stenosis    a. ultrasound 03/2015: RICA 50-38% stenosis, LICA less than 88% stenosis  . Chronic diastolic CHF (congestive heart failure) ([place])    a. echo 03/2015: EF 60-65%, normal wall motion, diastolic parameters were c/w restrictive physiology, indicative of decreased LV diastolic compliance and/or increased LA pressure, mild AS, mild MR, left atrium was severely dilated, RA was severely dilated, PASP was severely increased at 90 mm Hg  . Coronary artery disease, non-occlusive    a. cardiac cath 2013  . ESRD (end stage renal disease) ([place])    On Tue-Thur-Sat dialysis  . H/O ischemic right MCA stroke    a. 03/2015; b. residual left-sided facial droop and slurred speech  . Hyperparathyroidism, secondary renal ([place])   . Hypertension   . Nicotine dependence   . Pulmonary HTN ([place])    as above  . Symptomatic bradycardia    a. history of symptomatic bradycardia; b. felt  to be 2/2 metabolic abnormalities & required temp wire but no PPM, resolved with HD    Patient Active Problem List   Diagnosis Date Noted  . Hypotension 06/03/2018  . HCAP (healthcare-associated pneumonia) 05/28/2018  . Acute respiratory failure with hypoxia ([place]) 05/16/2018  . Complication of AV dialysis fistula 04/17/2017  . 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 Hyperlipidemia 03/16/2017  . Complication from renal dialysis device 11/27/2016  . Gastric polyp 08/18/2015  . Anemia 07/15/2015  . GI bleed 07/15/2015  . Polyneuropathy 07/08/2015  . Degenerative disc disease, lumbar 07/08/2015  . Pancytopenia ([place]) 07/08/2015  . Generalized weakness 07/08/2015  . End stage renal disease on dialysis ([place]) 07/08/2015  . Atherosclerotic peripheral vascular disease ([place]) 07/08/2015  . Pain in both feet 07/02/2015  . Gastric ulceration   . Acute GI bleeding   . GIB (gastrointestinal bleeding) 04/12/2015  . Left-sided weakness 04/01/2015  . Essential hypertension 04/01/2015  . Anemia of chronic disease 04/01/2015  . Elevated troponin 04/01/2015  . Pulmonary hypertension ([place]) 04/01/2015  . Atrial fibrillation ([place])   . CVA (cerebral infarction) 03/26/2015    Past Surgical History:  Procedure Laterality Date  . A/V FISTULAGRAM Left 10/25/2016   Procedure: A/V Fistulagram;  Surgeon: Katha Cabal, MD;  Location: [place] [facility];  Service: Cardiovascular;  Laterality: Left;  . A/V FISTULAGRAM Left 01/23/2018   Procedure: A/V FISTULAGRAM;  Surgeon: Katha Cabal, MD;  Location:  [place] [facility];  Service: Cardiovascular;  Laterality: Left;  . A/V FISTULAGRAM Left 05/15/2018   Procedure: A/V FISTULAGRAM;  Surgeon: Katha Cabal, MD;  Location: [place] [facility];  Service: Cardiovascular;  Laterality: Left;  . COLONOSCOPY WITH PROPOFOL N/A 12/19/2016   Procedure: COLONOSCOPY WITH PROPOFOL;  Surgeon: Jonathon Bellows, MD;  Location: [facility] ENDOSCOPY;  Service:  Gastroenterology;  Laterality: N/A;  . ESOPHAGOGASTRODUODENOSCOPY (EGD) WITH PROPOFOL N/A 04/14/2015   Procedure: ESOPHAGOGASTRODUODENOSCOPY (EGD) WITH PROPOFOL;  Surgeon: Lucilla Lame, MD;  Location: [facility] ENDOSCOPY;  Service: Endoscopy;  Laterality: N/A;  . ESOPHAGOGASTRODUODENOSCOPY (EGD) WITH PROPOFOL N/A 07/17/2015   Procedure: ESOPHAGOGASTRODUODENOSCOPY (EGD) WITH PROPOFOL;  Surgeon: Josefine Class, MD;  Location: [facility] ENDOSCOPY;  Service: Endoscopy;  Laterality: N/A;  . ESOPHAGOGASTRODUODENOSCOPY (EGD) WITH PROPOFOL N/A 12/19/2016   Procedure: ESOPHAGOGASTRODUODENOSCOPY (EGD) WITH PROPOFOL;  Surgeon: Jonathon Bellows, MD;  Location: [facility] ENDOSCOPY;  Service: Gastroenterology;  Laterality: N/A;  . EUS N/A 09/24/2015   Procedure: UPPER ENDOSCOPIC ULTRASOUND (EUS) LINEAR;  Surgeon: Jola Schmidt, MD;  Location: [facility] ENDOSCOPY;  Service: Endoscopy;  Laterality: N/A;  . LOWER EXTREMITY ANGIOGRAPHY Right 06/09/2016   Procedure: Lower Extremity Angiography;  Surgeon: Algernon Huxley, MD;  Location: [place] [facility];  Service: Cardiovascular;  Laterality: Right;  . PERIPHERAL VASCULAR CATHETERIZATION N/A 02/23/2015   Procedure: A/V Shuntogram/Fistulagram;  Surgeon: Algernon Huxley, MD;  Location: [place] [facility];  Service: Cardiovascular;  Laterality: N/A;  . PERIPHERAL VASCULAR CATHETERIZATION N/A 02/23/2015   Procedure: A/V Shunt Intervention;  Surgeon: Algernon Huxley, MD;  Location: [place] [facility];  Service: Cardiovascular;  Laterality: N/A;  . VISCERAL ANGIOGRAPHY N/A 04/04/2017   Procedure: VISCERAL ANGIOGRAPHY;  Surgeon: Katha Cabal, MD;  Location: [place] [facility];  Service: Cardiovascular;  Laterality: N/A;    Prior to Admission medications   Medication Sig Start Date End Date Taking? Authorizing Provider  carvedilol (COREG) 3.125 MG tablet Take 1 tablet (3.125 mg total) by mouth 2 (two) times daily with a meal for 30 days. 05/31/18 06/30/18 Yes Ojie, Jude, MD  clopidogrel (PLAVIX) 75  MG tablet Take 75 mg daily by mouth.   Yes [provider]  escitalopram (LEXAPRO) 10 MG tablet Take 10 mg by mouth daily.   Yes [provider]  guaifenesin (ROBITUSSIN) 100 MG/5ML syrup Take 200 mg by mouth every 6 (six) hours as needed for cough.   Yes [provider]  loratadine (CLARITIN) 10 MG tablet Take 10 mg by mouth daily.   Yes [provider]  mupirocin ointment (BACTROBAN) 2 % Place 1 application into the nose 2 (two) times daily.   Yes [provider]  pantoprazole (PROTONIX) 40 MG tablet Take 1 tablet (40 mg total) by mouth 2 (two) times daily. 09/19/15  Yes Carrie Mew, MD  spironolactone (ALDACTONE) 25 MG tablet Take 25 mg by mouth daily.   Yes [provider]  acetaminophen (TYLENOL) 500 MG tablet Take 500 mg by mouth every 4 (four) hours as needed for mild pain, fever or headache.     [provider]  alum & mag hydroxide-simeth (MINTOX) 200-200-20 MG/5ML suspension Take 30 mLs by mouth 4 (four) times daily as needed for indigestion or heartburn.     [provider]  atorvastatin (LIPITOR) 40 MG tablet Take 1 tablet (40 mg total) by mouth daily. Patient taking differently: Take 40 mg by mouth at bedtime.  09/19/15   Carrie Mew, MD  bisacodyl (DULCOLAX) 5 MG EC tablet  Take 5 mg by mouth daily as needed for mild constipation or moderate constipation.    [provider]  calcium acetate (PHOSLO) 667 MG capsule Take 1,334 mg by mouth 3 (three) times daily.    [provider]  calcium carbonate (TUMS - DOSED IN MG ELEMENTAL CALCIUM) 500 MG chewable tablet Chew 1 tablet by mouth 3 (three) times daily as needed.     [provider]  cetirizine (ZYRTEC) 10 MG tablet Take 10 mg by mouth daily.     [provider]  chlorpheniramine-HYDROcodone (TUSSIONEX) 10-8 MG/5ML SUER Take 5 mLs by mouth every 12 (twelve) hours as needed for cough. 05/31/18   Stark Jock Jude, MD  gabapentin  (NEURONTIN) 300 MG capsule Take 300 mg by mouth at bedtime.    [provider]  loperamide (IMODIUM) 2 MG capsule Take 2 mg by mouth as needed for diarrhea or loose stools.    [provider]  magnesium hydroxide (MILK OF MAGNESIA) 400 MG/5ML suspension Take 30 mLs by mouth at bedtime as needed for mild constipation.    [provider]  midodrine (PROAMATINE) 5 MG tablet Take 1 tablet (5 mg total) by mouth 3 (three) times daily with meals. Patient not taking: Reported on 06/08/2018 06/07/18   Otila Back, MD  Neomycin-Bacitracin-Polymyxin (TRIPLE ANTIBIOTIC) [phone] OINT Apply 1 application topically 3 times/day as needed-between meals & bedtime (for wound care).     [provider]  ondansetron (ZOFRAN) 4 MG tablet Take 4 mg by mouth every 6 (six) hours as needed for nausea or vomiting.     [provider]  polyethylene glycol (MIRALAX / GLYCOLAX) packet Take 17 g by mouth daily as needed for mild constipation. 09/19/15   Carrie Mew, MD  senna-docusate (SENOKOT-S) 8.6-50 MG tablet Take 1 tablet by mouth at bedtime as needed for mild constipation.    [provider]  traZODone (DESYREL) 50 MG tablet Take 50 mg by mouth at bedtime.  05/18/16   [provider]  trolamine salicylate (ASPERCREME) 10 % cream Apply topically 2 (two) times daily as needed for muscle pain. Patient not taking: Reported on 06/03/2018 09/19/15   Carrie Mew, MD    No Known Allergies  Family History  Problem Relation Age of Onset  . CAD Father   . Dementia Mother   . Lupus Sister     Social History Social History   Tobacco Use  . Smoking status: Former Smoker    Packs/day: 0.25    Years: 30.00    Pack years: 7.50    Types: Cigarettes    Last attempt to quit: 11/20/2013    Years since quitting: 4.5  . Smokeless tobacco: Never Used  Substance Use Topics  . Alcohol use: No    Alcohol/week: 0.0 standard drinks  . Drug use: No    Review of  Systems Constitutional: Negative for fever ENT: Negative for recent illness/congestion Cardiovascular: Negative for chest pain.  Respiratory: Negative for shortness of breath.  Negative for cough. Gastrointestinal: Negative for abdominal pain.  Positive for nausea but negative for vomiting or diarrhea.  Small amount of blood in her stool this morning. Genitourinary: Small amount of urine each day, no dysuria. Musculoskeletal: Negative for musculoskeletal complaints Skin: Negative for skin complaints  Neurological: Negative for headache All other ROS negative  ____________________________________________   PHYSICAL EXAM:  VITAL SIGNS: ED Triage Vitals  Enc Vitals Group     BP --      Pulse Rate 06/08/18 1356  97     Resp 06/08/18 1356 14     Temp 06/08/18 1356 97.7 F (36.5 C)     Temp Source 06/08/18 1356 Oral     SpO2 06/08/18 1355 95 %     Weight 06/08/18 1404 162 lb 9.6 oz (73.8 kg)     Height 06/08/18 1404 5\' 7"  (1.702 m)     Head Circumference --      Peak Flow --      Pain Score 06/08/18 1404 0     Pain Loc --      Pain Edu? --      Excl. in [place]? --    Constitutional: Alert and oriented. Well appearing and in no distress. Eyes: Normal exam ENT   Head: Normocephalic and atraumatic.   Mouth/Throat: Mucous membranes are moist. Cardiovascular: Normal rate, regular rhythm. Respiratory: Normal respiratory effort without tachypnea nor retractions. Breath sounds are clear Gastrointestinal: Soft and nontender. No distention. Musculoskeletal: Nontender with normal range of motion in all extremities Neurologic:  Normal speech and language. No gross focal neurologic deficit Skin:  Skin is warm, dry and intact.  Psychiatric: Mood and affect are normal.  ____________________________________________    EKG  EKG viewed and interpreted by myself shows atrial fibrillation at 104 bpm with a widened QRS, left axis deviation, QTC prolongation, abnormal morphology most  consistent with left bundle branch block.  ST depressions in V5 and V6.  However compared to prior EKG 06/03/2018, it is largely unchanged.  ____________________________________________   INITIAL IMPRESSION / ASSESSMENT AND PLAN / ED COURSE  Pertinent labs & imaging results that were available during my care of the patient were reviewed by me and considered in my medical decision making (see chart for details).  Patient presents to the emergency department for generalized weakness and nausea.  Was supposed to have dialysis this morning but did not due to the weakness.  Differential this time would include metabolic abnormality, elevated potassium or BUN.  Infectious etiology.  Reassuringly patient appears well.  We will treat with a small amount of IV fluids as well as Zofran.  Patient's lab work shows a normal potassium, chronic anemia but largely unchanged from baseline, in fact slightly improved.  We will continue to closely monitor the patient.  Patient's lab work is largely within normal limits/baseline for the patient.  Potassium is normal.  She is feeling much better after small amount of IV fluids as well as Zofran.  Denies any current nausea.  I believe the patient is safe for discharge home, with follow-up for dialysis on Monday.  ____________________________________________   FINAL CLINICAL IMPRESSION(S) / ED DIAGNOSES  Weakness Nausea   Harvest Dark, MD 06/08/18 1624

## 2018-06-08 NOTE — ED Notes (Signed)
Pt repositioned in bed, shoes taken off and placed into bag with all other belongings. Educated to keep arm straight so fluids will run.

## 2018-06-08 NOTE — ED Notes (Signed)
Secretary notified EMS transport will be needed for pt to return to [facility] as pt states she has no friends/family to take her back and [facility] states the transporter went home for the day.

## 2018-06-08 NOTE — ED Triage Notes (Signed)
Pt arrived via ems from [facility] with weakness r/t recent diagnosis of flu. Pt states that she has not been feeling well for the past few days. No fever but nauseous and dizzy. Pt is a dialysis pt but was unable to make her session today due to not feeling well.

## 2018-06-08 NOTE — ED Notes (Signed)
Called and spoke with courtney at [facility].  They report no transport available for pt.  No family local to take pt back to facility per courtney.  Discharge instructions went over with courtney.  She verbalized understanding.

## 2018-06-08 NOTE — ED Notes (Signed)
EKG given to EDP Paduchowski in person.

## 2018-06-08 NOTE — ED Notes (Signed)
Pt requesting food. Given tray.

## 2018-06-16 DEATH — deceased

## 2019-12-21 IMAGING — CR DG CHEST 2V
2 series · 2 of 2 positions shown · non-contrast
Comparison: 05/28/2018 chest radiograph.

CLINICAL DATA: New atrial fibrillation

EXAM:
CHEST - 2 VIEW

[chest pa]
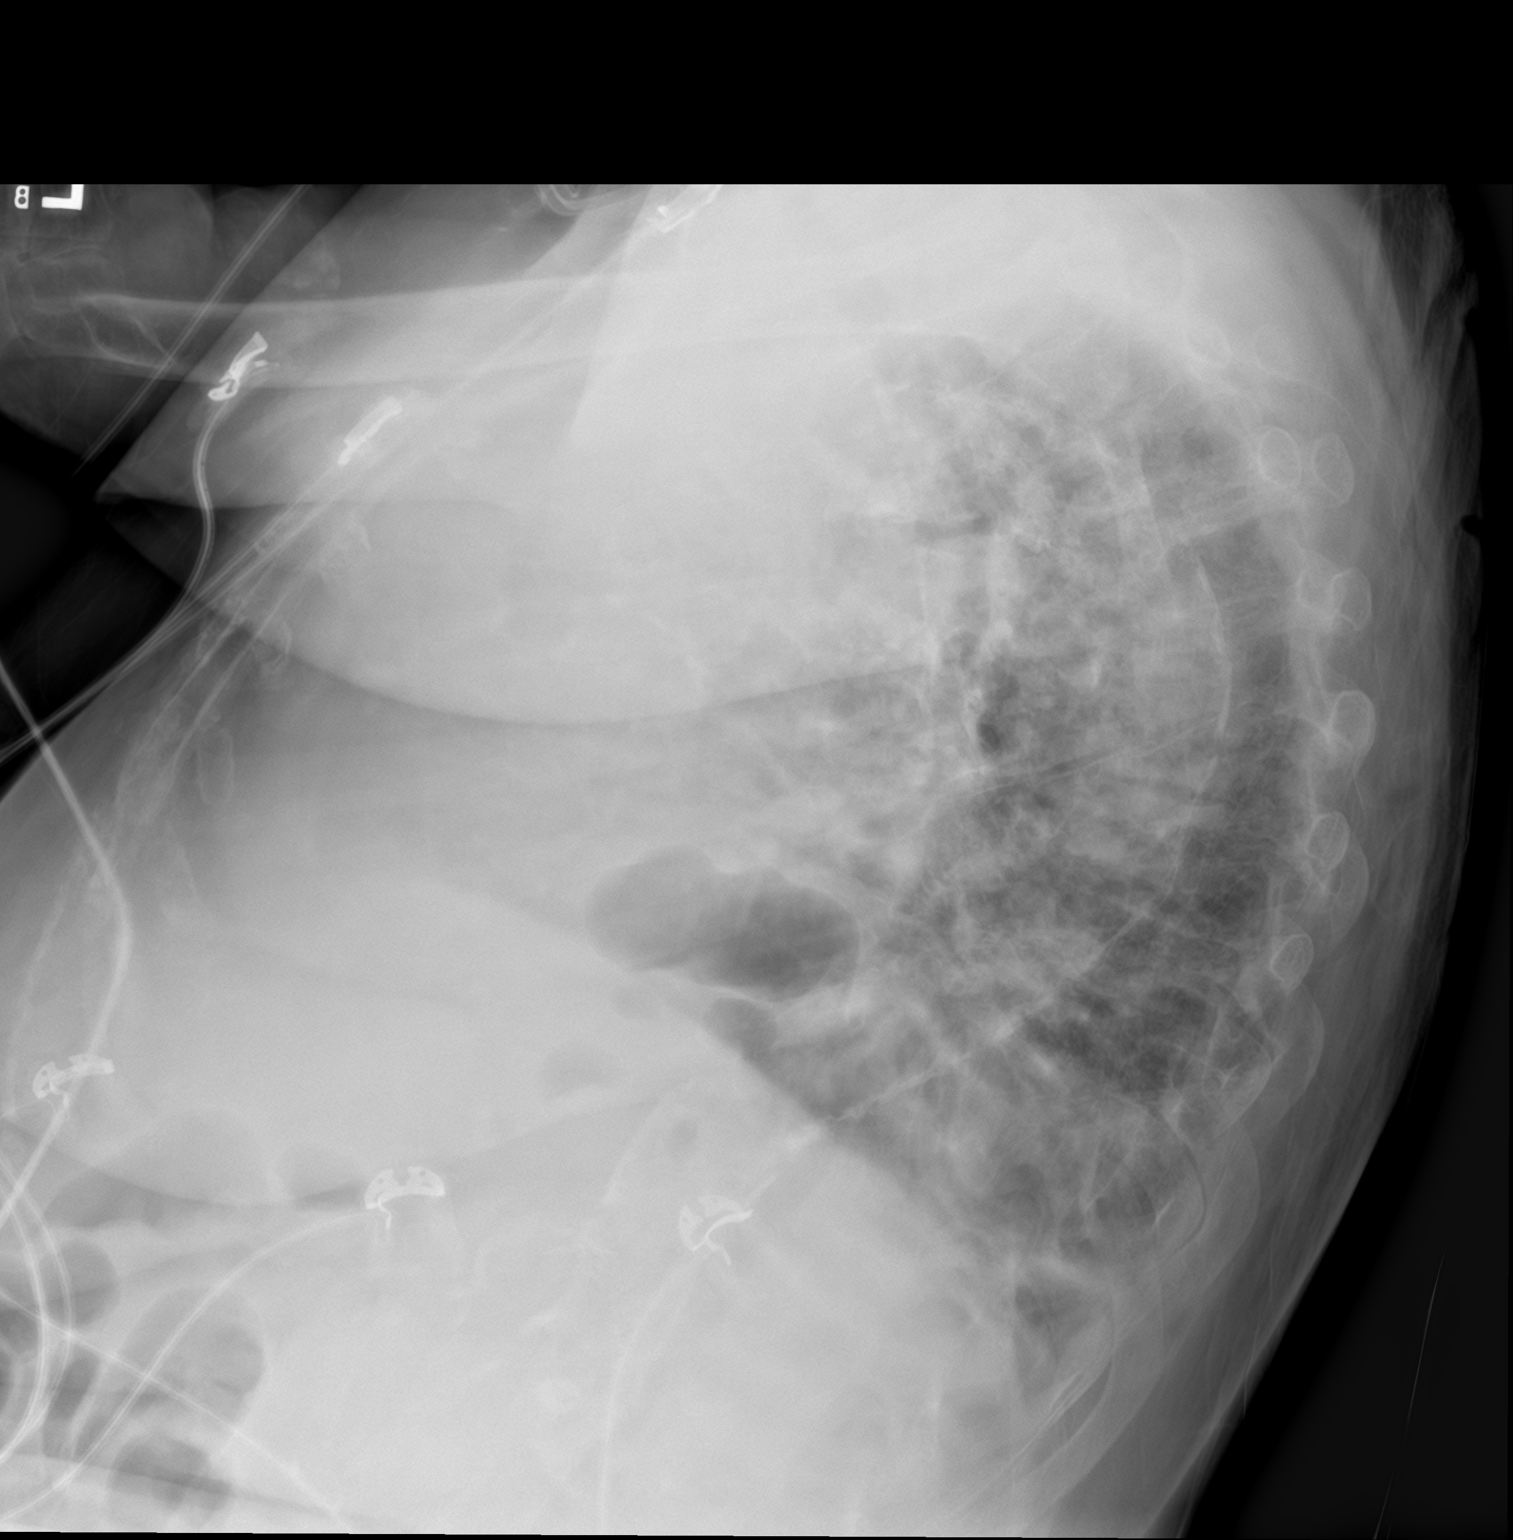

[chest ap]
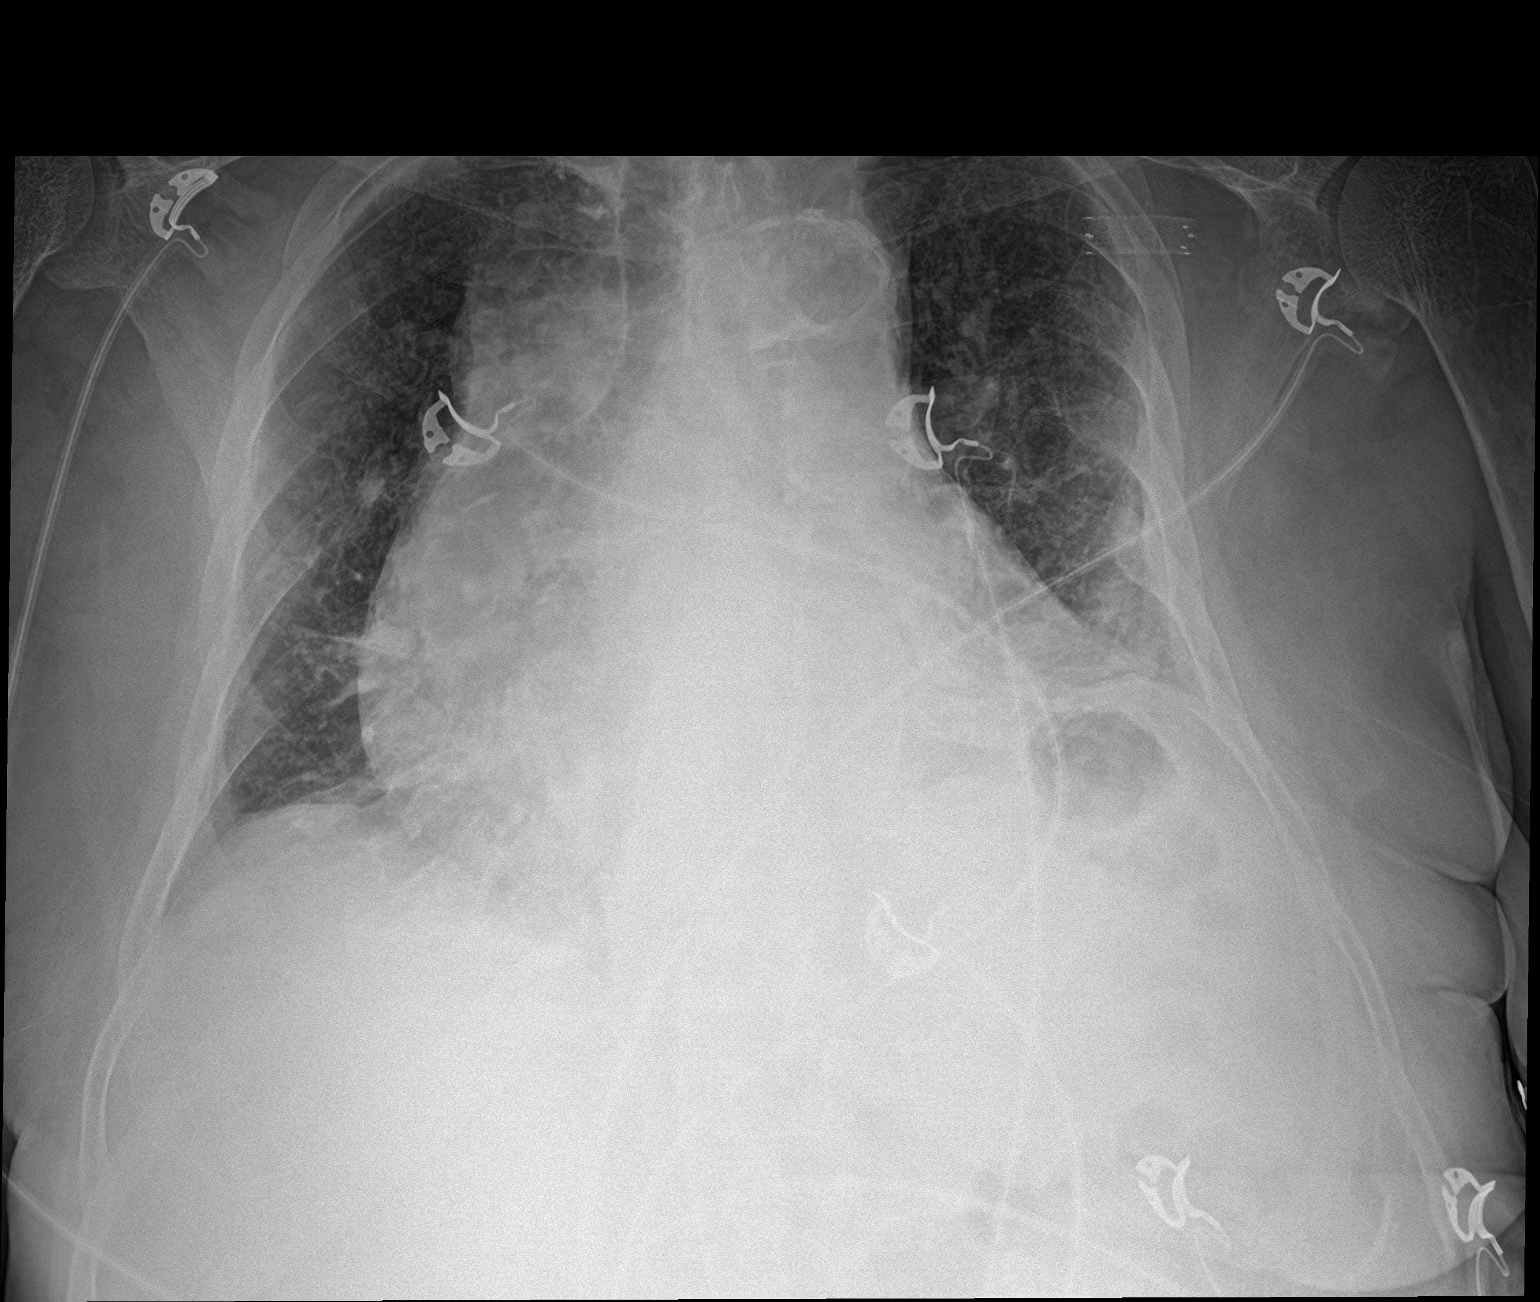

[2 of 2 positions shown; findings below may reference images not displayed]

FINDINGS: Vascular stent overlies the left axillary region. Stable
cardiomediastinal silhouette with moderate cardiomegaly. No
pneumothorax. No pleural effusion. Mild pulmonary edema. Mild left
basilar atelectasis.
IMPRESSION: 1. Mild congestive heart failure.
2. Mild left basilar atelectasis.
